# Patient Record
Sex: Female | Born: 1935
Health system: Southern US, Community
[De-identification: ages and names within clinical notes are randomized; demographics above are authoritative.]

## PROBLEM LIST (undated history)

## (undated) DIAGNOSIS — Z9289 Personal history of other medical treatment: Secondary | ICD-10-CM

## (undated) DIAGNOSIS — G253 Myoclonus: Secondary | ICD-10-CM

## (undated) DIAGNOSIS — R911 Solitary pulmonary nodule: Secondary | ICD-10-CM

## (undated) DIAGNOSIS — D3502 Benign neoplasm of left adrenal gland: Secondary | ICD-10-CM

## (undated) DIAGNOSIS — K635 Polyp of colon: Secondary | ICD-10-CM

## (undated) DIAGNOSIS — R739 Hyperglycemia, unspecified: Secondary | ICD-10-CM

## (undated) DIAGNOSIS — I10 Essential (primary) hypertension: Secondary | ICD-10-CM

## (undated) DIAGNOSIS — I2510 Atherosclerotic heart disease of native coronary artery without angina pectoris: Secondary | ICD-10-CM

## (undated) DIAGNOSIS — N200 Calculus of kidney: Secondary | ICD-10-CM

## (undated) DIAGNOSIS — Z720 Tobacco use: Secondary | ICD-10-CM

## (undated) DIAGNOSIS — E785 Hyperlipidemia, unspecified: Secondary | ICD-10-CM

## (undated) HISTORY — DX: Personal history of other medical treatment: Z92.89

## (undated) HISTORY — DX: Tobacco use: Z72.0

## (undated) HISTORY — DX: Calculus of kidney: N20.0

## (undated) HISTORY — DX: Polyp of colon: K63.5

## (undated) HISTORY — DX: Benign neoplasm of left adrenal gland: D35.02

## (undated) HISTORY — DX: Hyperlipidemia, unspecified: E78.5

## (undated) HISTORY — DX: Hyperglycemia, unspecified: R73.9

## (undated) HISTORY — DX: Myoclonus: G25.3

## (undated) HISTORY — DX: Solitary pulmonary nodule: R91.1

## (undated) HISTORY — PX: TOTAL ABDOMINAL HYSTERECTOMY: SHX209

## (undated) HISTORY — PX: CHOLECYSTECTOMY: SHX55

## (undated) HISTORY — PX: CORONARY ANGIOPLASTY WITH STENT PLACEMENT: SHX49

## (undated) HISTORY — DX: Atherosclerotic heart disease of native coronary artery without angina pectoris: I25.10

---

## 1999-08-09 ENCOUNTER — Encounter: Payer: Self-pay | Admitting: Emergency Medicine

## 1999-08-09 ENCOUNTER — Inpatient Hospital Stay (HOSPITAL_COMMUNITY): Admission: EM | Admit: 1999-08-09 | Discharge: 1999-08-11 | Payer: Self-pay | Admitting: Emergency Medicine

## 1999-10-04 ENCOUNTER — Encounter: Admission: RE | Admit: 1999-10-04 | Discharge: 1999-10-04 | Payer: Self-pay | Admitting: Internal Medicine

## 1999-10-04 ENCOUNTER — Encounter: Payer: Self-pay | Admitting: Internal Medicine

## 2000-09-03 ENCOUNTER — Emergency Department (HOSPITAL_COMMUNITY): Admission: EM | Admit: 2000-09-03 | Discharge: 2000-09-03 | Payer: Self-pay | Admitting: Emergency Medicine

## 2000-09-03 ENCOUNTER — Encounter: Payer: Self-pay | Admitting: Emergency Medicine

## 2001-04-19 ENCOUNTER — Ambulatory Visit (HOSPITAL_COMMUNITY): Admission: RE | Admit: 2001-04-19 | Discharge: 2001-04-19 | Payer: Self-pay | Admitting: Gastroenterology

## 2002-02-16 ENCOUNTER — Encounter: Payer: Self-pay | Admitting: Emergency Medicine

## 2002-02-17 ENCOUNTER — Inpatient Hospital Stay (HOSPITAL_COMMUNITY): Admission: EM | Admit: 2002-02-17 | Discharge: 2002-02-17 | Payer: Self-pay | Admitting: Emergency Medicine

## 2003-02-10 ENCOUNTER — Encounter: Payer: Self-pay | Admitting: Internal Medicine

## 2003-02-10 ENCOUNTER — Ambulatory Visit (HOSPITAL_COMMUNITY): Admission: RE | Admit: 2003-02-10 | Discharge: 2003-02-10 | Payer: Self-pay | Admitting: Internal Medicine

## 2004-01-21 ENCOUNTER — Emergency Department (HOSPITAL_COMMUNITY): Admission: EM | Admit: 2004-01-21 | Discharge: 2004-01-21 | Payer: Self-pay | Admitting: Emergency Medicine

## 2004-10-05 ENCOUNTER — Encounter: Admission: RE | Admit: 2004-10-05 | Discharge: 2004-10-05 | Payer: Self-pay | Admitting: Internal Medicine

## 2004-10-20 ENCOUNTER — Encounter: Admission: RE | Admit: 2004-10-20 | Discharge: 2004-10-20 | Payer: Self-pay | Admitting: Internal Medicine

## 2005-03-24 ENCOUNTER — Encounter: Admission: RE | Admit: 2005-03-24 | Discharge: 2005-03-24 | Payer: Self-pay | Admitting: Internal Medicine

## 2005-06-08 ENCOUNTER — Encounter: Admission: RE | Admit: 2005-06-08 | Discharge: 2005-06-08 | Payer: Self-pay | Admitting: Orthopedic Surgery

## 2006-08-15 ENCOUNTER — Encounter: Admission: RE | Admit: 2006-08-15 | Discharge: 2006-08-15 | Payer: Self-pay | Admitting: Gastroenterology

## 2007-02-15 ENCOUNTER — Encounter: Admission: RE | Admit: 2007-02-15 | Discharge: 2007-02-15 | Payer: Self-pay | Admitting: *Deleted

## 2007-02-21 ENCOUNTER — Encounter: Admission: RE | Admit: 2007-02-21 | Discharge: 2007-02-21 | Payer: Self-pay | Admitting: *Deleted

## 2009-02-17 ENCOUNTER — Emergency Department (HOSPITAL_BASED_OUTPATIENT_CLINIC_OR_DEPARTMENT_OTHER): Admission: EM | Admit: 2009-02-17 | Discharge: 2009-02-17 | Payer: Self-pay | Admitting: Emergency Medicine

## 2009-02-17 ENCOUNTER — Ambulatory Visit: Payer: Self-pay | Admitting: Diagnostic Radiology

## 2009-02-28 ENCOUNTER — Encounter: Admission: RE | Admit: 2009-02-28 | Discharge: 2009-02-28 | Payer: Self-pay | Admitting: Geriatric Medicine

## 2009-06-07 ENCOUNTER — Encounter: Admission: RE | Admit: 2009-06-07 | Discharge: 2009-06-07 | Payer: Self-pay | Admitting: Orthopedic Surgery

## 2009-10-01 ENCOUNTER — Encounter: Admission: RE | Admit: 2009-10-01 | Discharge: 2009-10-01 | Payer: Self-pay | Admitting: Geriatric Medicine

## 2009-12-09 ENCOUNTER — Encounter: Admission: RE | Admit: 2009-12-09 | Discharge: 2009-12-09 | Payer: Self-pay | Admitting: Geriatric Medicine

## 2010-03-31 ENCOUNTER — Encounter: Admission: RE | Admit: 2010-03-31 | Discharge: 2010-03-31 | Payer: Self-pay | Admitting: Geriatric Medicine

## 2010-04-06 ENCOUNTER — Encounter: Admission: RE | Admit: 2010-04-06 | Discharge: 2010-04-06 | Payer: Self-pay | Admitting: Geriatric Medicine

## 2010-05-26 ENCOUNTER — Encounter: Admission: RE | Admit: 2010-05-26 | Discharge: 2010-05-26 | Payer: Self-pay | Admitting: Geriatric Medicine

## 2010-08-22 ENCOUNTER — Encounter: Payer: Self-pay | Admitting: *Deleted

## 2010-11-07 LAB — COMPREHENSIVE METABOLIC PANEL
ALT: 11 U/L (ref 0–35)
Alkaline Phosphatase: 43 U/L (ref 39–117)
CO2: 31 mEq/L (ref 19–32)
Chloride: 102 mEq/L (ref 96–112)
GFR calc Af Amer: >60 mL/min (ref >60)
Glucose, Bld: 118 mg/dL — ABNORMAL HIGH (ref 70–99)
Sodium: 142 mEq/L (ref 135–145)
Total Bilirubin: 0.4 mg/dL (ref 0.3–1.2)

## 2010-11-07 LAB — DIFFERENTIAL
Basophils Relative: 1 % (ref 0–1)
Eosinophils Relative: 2 % (ref 0–5)
Monocytes Absolute: 0.6 10*3/uL (ref 0.1–1.0)
Neutro Abs: 4.3 10*3/uL (ref 1.7–7.7)

## 2010-11-07 LAB — CBC
HCT: 42.8 % (ref 36.0–46.0)
MCV: 95.8 fL (ref 78.0–100.0)
Platelets: 211 10*3/uL (ref 150–400)
RBC: 4.47 MIL/uL (ref 3.87–5.11)

## 2010-11-07 LAB — URINE MICROSCOPIC-ADD ON

## 2010-11-07 LAB — URINALYSIS, ROUTINE W REFLEX MICROSCOPIC
Bilirubin Urine: NEGATIVE
Ketones, ur: NEGATIVE mg/dL
Nitrite: NEGATIVE
Protein, ur: NEGATIVE mg/dL
Urobilinogen, UA: 0.2 mg/dL (ref 0.0–1.0)

## 2010-12-17 NOTE — Consult Note (Signed)
[place]. [facility]  Patient:    Kim Sutton                          MRN: [id number] Proc. Date: 08/10/99 Adm. Date:  [id number] Attending:  Rosanne Sack Dictator:   Delano Metz, M.D.                          Consultation Report  CHIEF COMPLAINT:  Chest pain.  HISTORY OF PRESENT ILLNESS:  Kim Sutton is a 77 year old white female who presented to the emergency department after an episode of severe 10/10 substernal chest pain while taking a bath.  This occurred the night of presentation to the ED.  She felt she was "kicked in the chest."  The pain lasts for 15-20 minutes.  It radiated o her neck and right arm.  En route to the emergency department, she vomited four  times.  She also complains of being clammy.  Nitroglycerin sublingual did not relieve her pain initially.  By the time of arrival to the hospital, her pain had decreased significantly.  She is currently with no pain and on a nitroglycerin drip.  The patient had no history of angina.  She has no other medical history.  CARDIAC RISKS FACTORS:  Include a 38-pack-year history of smoking, a family history that includes her mother that had an MI at 74 years of age.  She is also postmenopausal, not on hormone replacement therapy.  PAST MEDICAL HISTORY:  Negative for diabetes. 1. Borderline dyslipidemia. 2. Postmenopausal since 1967, no hormone replacement therapy. 3. Cholecystectomy in 1990. 4. Tobacco abuse.  MEDICATIONS:  None.  ALLERGIES:  CECLOR causes hives.  FAMILY HISTORY: 1. Hypertension in her mother, brother, and sisters. 2. MI in her mother. 3. Diabetes in her mother. 4. Lung cancer in her father.  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  Her nephew, son and grandchildren live in [place]. She stills smokes a pack-per-day.  She does not drink alcohol.  She currently lives alone.  Her husband  is in the process of joining her here after he sells his business.  REVIEW OF SYSTEMS:  Positive for fatigue with exertion recently.  Positive for palpitations "heart racing" on two occasions recently.  PHYSICAL EXAMINATION:  VITAL SIGNS:  Temperature 97.3, blood pressure 117/66, pulse 72, respirations 20, O2 saturations 97% on room air.  GENERAL:  In no acute distress, alert and oriented.  HEENT:  Pupils equal, round, and reactive to light.  Extraocular muscles intact.  NECK:  Supple with no lymphadenopathy, no thyromegaly.  No JVD.  HEART:  S1, S2.  No murmurs, gallops, or rubs.  LUNGS:  Clear to auscultation bilaterally.  No wheezes, rubs, or rhonchi.  ABDOMEN:  Soft, nontender, nondistended.  Positive bowel sounds.  EXTREMITIES:  Negative for edema.  Negative for cyanosis.  Pulses full and symmetric.  NEUROLOGICAL:  No focal deficits.  LABORATORY DATA:  White blood cell count 11.8, hemoglobin 12.8, hematocrit 37.1, platelets 284.  Sodium 139, potassium 3.7, chloride 109, bicarb 27, BUN 17, creatinine 0.7, glucose 103, calcium 8.1.  ECG shows a normal sinus rhythm with possible LVH.  No ST or T changes.  Initial CK is 69, subsequent 128.  Initial MB is 1.4, subsequent 7.5.  Initial troponin I 0.16, subsequent 0.46.  ASSESSMENT AND PLAN:  A 77 year old white female with chest  pain, elevated cardiac enzymes. 1. Cardiac catheterization with possible angioplasty, pain-free currently. 2. Beta blocker started, held this a.m., the patient is on a nitroglycerin drip,    heparin and aspirin. 3. A statin has been started. DD:  08/10/99 TD:  08/10/99 Job: [id number]

## 2010-12-17 NOTE — H&P (Signed)
[place]. [facility]  Patient:    Kim Sutton                          MRN: [id number] Adm. Date:  [id number] Attending:  Doug Sou                         History and Physical  DATE OF BIRTH:  02/07/1934  PROBLEM LIST: 1. Chest pain, rule out myocardial infarction, worrisome for unstable angina. 2. Smoking one-pack per day x 38 years. 3. Hyperlipidemia. 4. Postmenopausal since 1967 after a total abdominal hysterectomy with    bilateral salpingo-oophorectomy; no hormone replacement therapy. 5. Obesity. 6. Status post cholecystectomy in 1990. 7. Allergy to CECLOR (hives).  CHIEF COMPLAINT:  Substernal chest pain.  HISTORY OF PRESENT ILLNESS:  Kim Sutton is a very pleasant 77 year old white female, who recently moved from [place] about two months ago.  Today about 8 p.m., the patient was taking a warm bath when she started noticing a substernal chest pressure that became a pain, radiated into the right arm and right shoulder as ell as the right side of her neck.  This pain lasted about 15-20 minutes.  With the  first two sublingual nitroglycerin, the pain changed from a 10/10 to 6/10 and then with a third nitroglycerin 1/10.  The patient reports no shortness of breath, nausea, vomiting, or diaphoresis with these symptoms.  By the time that the patient arrived in the emergency department, the symptoms resolved.  Kim Sutton denies any previous history of anginal symptoms or similar symptoms like the ones that she had tonight.  She is not very active.  She smokes as described in the problem list.  She has a history of hyperlipidemia, though she does not take any medication for this problems nor follows any specific diet.  She has been postmenopausal without hormone replacement therapy since 1967.  Her mother had an acute MI at age 2 years old.  She is obese.  The patient denies orthopnea, PND, fever, chills, skin rash, joint swelling,  lower extremity pain, lower extremity swelling.  No previous history of MI nor diabetes nor hypertension.  No previous history of GI bleed or PUD.  No focal weakness or swallowing problems.  No postnasal drip.  No cough. No fever.  No diarrhea.  PAST MEDICAL HISTORY:  As problem list.  ALLERGIES:  CECLOR.  MEDICATIONS:  None.  FAMILY MEDICAL HISTORY:  Significant for coronary artery disease (mother acute I at the age of 36).  The patients mother had diabetes as well as CVA. Hypertension runs in the family, her mother, sister, and brother have hypertension.  Her father had a lung cancer after many years of smoking.  SOCIAL HISTORY:  The patient is married.  She has three grown children.  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  She drinks occasionally a glass of wine with meals.  She smokes as described in the problem list.  REVIEW OF SYSTEMS:  As HPI.  No melena.  No tarry stools.  No bright red blood er rectum.  No cough.  No skin rash.  No blurred vision.  No syncope.  No tachycardia. Currently, the patient denies chest pain.  PHYSICAL EXAMINATION:  VITAL SIGNS:  Temperature 97.3, blood pressure 117/66, heart rate 72, respirations 20, oxygen saturations 97% on 2 L nasal cannula.  HEENT:  Normocephalic, atraumatic, nonicteric sclerae.  Conjunctivae within normal limits.  PERRLA.  EOMI.  Funduscopic negative for bleed or hemorrhages.  TMs within normal limits.  Slight dry mucous membranes.  Oropharynx clear.  NECK:  Supple.  No JVD.  No bruits.  No adenopathy.  No thyromegaly.  LUNGS:  Clear to auscultation with occasional scattered rales in the bases, otherwise negative for crackles.  No wheezing.  Free air movement bilaterally.   CARDIAC:  Regular rate and rhythm with no murmurs, rubs, or gallops.  Normal S1, S2.  ABDOMEN:  Flat and slightly obese.  Nontender,  nondistended.  Bowel sounds were present.  No hepatosplenomegaly.  No  bruits.  No masses.  No rebound.  BREASTS:  Within normal limits.  GU:  Examination within normal limits.  RECTAL:  ______ vault, normal sphincter tone.  EXTREMITIES:  No edema, clubbing, or cyanosis.  The patient is 1+ bilaterally.  NEUROLOGICAL:  Alert and oriented x 3, strength 5/5 in all extremities.  DTRs 3/5 in all extremities.  Cranial nerves II-XII intact.  Sensory is intact.  Plantar  reflexes downgoing bilaterally.  LABORATORY DATA:  ECG:  Normal sinus rhythm with a ______ tubular rate of 76, slight J point elevation versus ST segment elevation in V1, V2.  Flattening T wave in aVL.  Normal R wave progression.  The chest x-ray is negative except for some bibasilar atelectasis.  The troponin I is slightly elevated at 0.1.  The CK-MB is normal with a CK of 69 and an MB of 1.4.  Hemoglobin 12.8, MCV 91, WBC 11.8 with a left shift, platelets 384.  Creatinine 0.4, glucose 113, BUN 18, sodium 140, potassium 3.8, chloride 05, CO2 of 33.  A pH is 7.42 with a pCO2 of 48.  ASSESSMENT AND PLAN: 1. Given the patients cardiac risk factors and the history of presentation, her    pretest probability for a significant coronary artery disease is more than 60%.    At this point, the patient is chest pain free as described in the lab data    section, the troponin I is borderline though the CK is negative at this point.    We will go ahead and admit the patient to a telemetry bed.  The cardiac enzymes    and ECG series will be monitored.  Anticoagulation therapy will be provided ith    Lovenox.  We will continue with a nitroglycerin drip.  Aspirin was given by    EMS (four baby aspirin), beta blocker for cardiac protection and continue the    oxygen supplementation.  Will consider Integrilin.  A cardiology consult will be    obtained to consider a cardiac catheterization for further work-up. 2. Smoking--I spent about 10 minutes talking about smoking cessation.  We will o     ahead and start the patient on a nicotine patch. 3. Hyperlipidemia--given the history of present illness with the cardiac risk    factors described in the history of present illness, we will start the patient     on Zocor and the LFTs will be monitored in the next few days. 4. Postmenopausal--the patient should started on hormone replacement therapy as    soon as her cardiac work-up is finished. DD:  08/10/99 TD:  08/10/99 Job: [id number]

## 2010-12-17 NOTE — Procedures (Signed)
[place]. [facility]  Patient:    Kim Sutton, Kim Sutton Visit Number: [id number] MRN: [id number]          Service Type: Attending:  Verlin Grills, M.D. Dictated by:   Verlin Grills, M.D. Proc. Date: 04/19/01   CC:         Julieanne Manson, M.D.   Procedure Report  DATE OF BIRTH:  28-Mar-1934  REFERRING PHYSICIAN:  Julieanne Manson, M.D.  PROCEDURE PERFORMED:  Colonoscopy.  ENDOSCOPIST:  Verlin Grills, M.D.  INDICATIONS FOR PROCEDURE:  The patient is a 76 year old female who is due for her first screening colonoscopy with polypectomy to prevent colon cancer.  Her 55 year old brother was recently diagnosed with colon cancer.  PREMEDICATION:  Fentanyl 50 mcg, Versed 5 mg.  ENDOSCOPE:  Olympus pediatric video colonoscope.  DESCRIPTION OF PROCEDURE:  After obtaining informed consent, the patient was placed in the left lateral decubitus position.  I administered intravenous fentanyl and intravenous Versed to achieve conscious sedation for the procedure.  The patients blood pressure, oxygen saturation and cardiac rhythm were monitored throughout the procedure and documented in the medical record.  Anal inspection was normal.  Digital rectal exam was normal.  The Olympus pediatric video colonoscope was then introduced into the rectum and easily advanced to the cecum.  Colonic preparation for the exam today was excellent.  Rectum:  Normal.  Sigmoid colon and descending colon:  Normal.  Splenic flexure:  Normal.  Transverse colon:  Normal.  Hepatic flexure:  Normal.  Ascending colon:  Normal.  Cecum and ileocecal valve:  Normal.  ASSESSMENT:  Normal proctocolonoscopy to the cecum.  No endoscopic evidence for the presence of colorectal neoplasia. Dictated by:   Verlin Grills, M.D. Attending:  Verlin Grills, M.D. DD:  04/19/01 TD:  04/19/01 Job: [id number]

## 2010-12-17 NOTE — Discharge Summary (Signed)
[place]. [facility]  Patient:    Kim Sutton, Kim Sutton Visit Number: [id number] MRN: [id number]          Service Type: MED Location: [id number] Attending Physician:  Jerl Santos Admit Date:  02/16/2002 Discharge Date: 02/17/2002   CC:         Jerl Santos, M.D.  Julieanne Manson, M.D.   Discharge Summary  DISCHARGE DIAGNOSES: 1. Right lower extremity cellulitis - improved. 2. Chronic tobacco abuse. 3. Hyperlipidemia. 4. Coronary artery disease, status post stent placement 2001. 5. Hypertension.  DISCHARGE MEDICATIONS: 1. Tequin 400 mg daily x7 days. 2. Lipitor 20 mg a day. 3. Metoprolol 50 mg a day. 4. Lisinopril 20/25 one daily. 5. Aspirin 325 mg daily.  HOSPITAL COURSE:  The patient is a pleasant 77 year old female who developed some right lower extremity distal leg anterior erythema the day prior to admission.  It became quite painful so that she was unable to walk without pain.  Dorsiflexion of the foot causes pain in the distal anterior leg area. She took two Advil three times a day without benefit, came to the emergency room and was found to have an area of erythema over the distal anterior right leg.  She was started on IV Tequin and the erythema and pain improved greatly overnight and she was currently afebrile and feeling much improved.  Repeat examination of the leg on the day of discharge revealed no pain in the calf, no cords noted and no significant edema of the leg.  ASSESSMENT:  Cellulitis - improved on one dose of intravenous Tequin.  PLAN:  Will discharge home to take seven more days of Tequin 400 mg daily.  If she develops fever to 100 or greater, or increase in erythema or pain, she is to contact Julieanne Manson, M.D., immediately.  Otherwise she should follow-up with Dr. Delrae Alfred later this week. Attending Physician:  Jerl Santos DD:  02/17/02 TD:  02/22/02 Job: [id number]

## 2010-12-17 NOTE — Discharge Summary (Signed)
[place]. [facility]  Patient:    Kim Sutton, Kim Sutton                         MRN: [id number] Adm. Date:  08/09/99 Disc. Date: 08/11/99 Dictator:   Anastasio Auerbach, M.D. CC:         Verdis Prime, M.D.             Julieanne Manson, M.D.                           Discharge Summary  DATE OF BIRTH:  July 11, 1934  DISCHARGE DIAGNOSES: 1.  Acute MI.     a.  Status post PTCA of the RCA. 2.  ? Hyperlipidemia. 3.  Tobacco use.     a.  One pack per day x 38 years. 4.  Postmenopausal, since 1967.     a.  Status post TAH/BSO, 1967. 5.  Status post cholecystectomy, 1990. 6.  Allergy to Ceclor (hives). 7.  Normocytic anemia.     a.  Discharge hemoglobin 11.6, MCV 91.  DISCHARGE MEDICATIONS: 1.  Lopressor 50 mg 1/2 tablet b.i.d. 2.  Nitroglycerin 0.4 mg sublingual p.r.n. 3.  Plavix 75 mg q.d. x four weeks. 4.  Aspirin 325 mg q.d. 5.  Nicotine patch 21 mg q.d. x two weeks, then decrease to 14 mg q.d. x     two weeks, then stop.  DISCHARGE CONDITION:  Stable.  The patient is pain-free.  ACTIVITY:  Recommended no strenuous activity until follow-up with Dr. Katrinka Blazing.  DIET:  Recommended low-fat diet.  SPECIAL INSTRUCTIONS:  The patient was instructed not to resume smoking.  She should wash the groin site with soap and water and call if there are any problems.  DISPOSITION: 1.  The patient has an appointment to see Dr. Verdis Prime, cardiologist, on Friday,     August 20, 1999, at 11:00 a.m.  At that time she will need her symptoms as  well as her blood pressure reassessed.  She should also have her lipids     evaluated and be considered for therapy.  She should also have her smoking     status readdressed. 2.  The patient has been set up to see a primary care physician, Dr. Felizardo Hoffmann,     at [facility] on Thursday, September 02, 1999, at 4:15 p.m.  Dr.     Delrae Alfred should address the smoking as well as the patients postmenopausal     state and normocytic  anemia.  CONSULTANTS:  Verdis Prime, M.D.  PROCEDURES: 1.  EKG (August 10, 1999), sinus bradycardia; otherwise normal. 2.  Cardiac catheterization (August 10, 1999), left main, LAD, circumflex okay.     RCA 95% mid stenosis.  Status post PTCA to 0% stenosis.  Angioplasty site     stented.  LV function normal.  HOSPITAL COURSE: #1 - ACUTE MYOCARDIAL INFARCTION:  This 77 year old Caucasian woman with a history of tobacco use, hyperlipidemia, postmenopausal state, and family history of CAD, presents with substernal chest pain radiating to her right arm and shoulder. Upon arrival to the emergency department her symptoms had resolved but given these risk factors she was admitted for rule-out.  Initial Troponin was slightly elevated t 0.10.  The CK was normal at 69 with an MB fraction of 1.4.  Her second Troponin I had increased to 0.46 and her total CK was 128 with an MB fraction  of 7.5.  She had been placed on heparin and nitroglycerin, beta-blocker and aspirin, as well as oxygen on admission.  Cardiology was consulted and the patient underwent cardiac catheterization.  She did indeed have a lesion in the RCA, which was stented. he had no complications related to the procedure.  She will be discharged on Plavix for four weeks as well as long-term aspirin and beta-blocker.  She will be re-evaluated as an outpatient concerning the use of any lipid-lowering medications. She will be counseled on smoking cessation as an outpatient.  Her postmenopausal state will also be addressed.  She will follow up with her cardiologist and be assigned to a new primary care Nesreen Albano.  #2 -  POSTMENOPAUSAL:  The patient did have a hysterectomy years ago.  She has never been on Premarin.  It certainly may be beneficial with her lipid status although after the first event data is varying.  Will have her follow up with Dr. Felizardo Hoffmann to discuss this further.  #3 - NORMOCYTIC ANEMIA:  Ms. Dunivan  had a slight hemoglobin drop after her cardiac catheterization.  This can be repeated in three to four weeks.  DISCHARGE LABORATORY DATA:  Sodium 138, potassium 3.5, chloride 104, bicarbonate 29, BUN 9, creatinine 0.7, glucose 88.  Hemoglobin 11.6, WBC 7600; platelet count 247,000. DD:  08/11/99 TD:  08/11/99 Job: [id number]

## 2010-12-17 NOTE — Cardiovascular Report (Signed)
[place]. [facility]  Patient:    Kim Sutton, Kim Sutton                         MRN: [id number] Proc. Date: 08/10/99 Adm. Date:  08/09/99 Attending:  Celso Sickle, M.D. CC:         Rosanne Sack, M.D.                        Cardiac Catheterization  CINE NUMBER:  [id number]  INDICATIONS:  Acute coronary syndrome.  PROCEDURES PERFORMED: 1. Left heart catheterization. 2. Selective coronary angiography. 3. Left ventriculography.  DESCRIPTION OF PROCEDURE:  After informed consent, a 6-French sheath was started in the right femoral artery using the modified Seldinger technique.  A 6-French A2  multipurpose catheter was used for hemodynamic recordings, left ventriculography, and selective left and right coronary angiography.  The patient tolerated the diagnostic procedure without complications.  After review of the cineangiograms, it was felt that percutaneous intervention on the right coronary artery was necessary.  We upgraded to a 7-French arterial sheath system over the guide wire.  We used  7-French #4 right Judkins guide catheter and a BMW 0.014 190 cm long wire. Angioplasty was performed with a 3.5 mm diameter CrossSail balloon.  One inflation was performed to 12 atm.  We then deployed an 18 mm long Tetra 4.0 mm stent to 16 atm.  Balloon time was 74 seconds.  The patient had chest discomfort with each balloon inflation.  The patient tolerated this procedure without complications.   The patient received 3000 units of IV heparin prior to starting the procedure and was given a bolus and infusion of ReoPro.  ACT post procedure was 311 seconds.   RESULTS:    I. HEMODYNAMIC DATA:        a. Aortic pressure:  117/60.        b. Left ventricular pressure:  120/15.   II. LEFT VENTRICULOGRAPHY:  The left ventricle demonstrates normal contractility.      No mitral regurgitation is noted.  Ejection fraction is 75%.  III. SELECTIVE CORONARY  ANGIOGRAPHY:        a. Left main coronary:  Normal.        b. Left anterior descending coronary artery:  The LAD is a large vessel hat           reaches the left ventricular apex.  It gives origin to two large diagonal           branches.  There is also a septal perforator that arises proximally. The           left system contains some calcification and irregularities, but no           high-grade obstruction.  The second diagonal contains a 40% ostial           stenosis.        c. Circumflex artery:  The circumflex is moderate in size and gives origin           to a bifurcating obtuse marginal system.  Two obtuse marginals arise rom           the proximal circumflex.  No significant obstruction is noted.        d. Right coronary artery:  The right coronary artery is large, giving origin           to a large PDA and a large  left ventricular branch that trifurcates on           the left lateral wall.  In the mid RCA, there is an eccentric significant           stenosis that appears to be 70% narrowed in the LAO projections and 95%           narrowed with evidence of an intimal dissection in the RAO projections.           There is post-stenotic dilatation with what appears to be a fusiform rea           of ectasia measuring greater than 5 mm in diameter.  No significant           disease is noted other than in this region.   IV. PERCUTANEOUS CORONARY INTERVENTION:  The right coronary in the mid segment is      85% to 95% occluded and after stenting there is 0% stenosis with brisk      antegrade flow.  CONCLUSIONS: 1. Acute coronary syndrome due to ruptured plaque with intimal dissection in the    mid right coronary artery. 2. Successful percutaneous coronary intervention on the right coronary artery with    reduction in stenosis from 85% to 95% down to 0%. 3. No significant disease is noted in the left coronary system.  The second    diagonal contains a 40% lesion and there  are irregularities in the left anterior    descending artery. 4. Normal left ventricular function.  RECOMMENDATIONS:  Complete 12-hour ReoPro infusion.  Plavix will be started. Beta-blocker will be continued.  Heparin will not be resumed.  The patient will be discharged hopefully on August 11, 1999.DD:  08/10/99 TD:  08/10/99 Job: [id number]

## 2011-08-05 DIAGNOSIS — Z0000 Encounter for general adult medical examination without abnormal findings: Secondary | ICD-10-CM | POA: Diagnosis not present

## 2011-08-05 DIAGNOSIS — N951 Menopausal and female climacteric states: Secondary | ICD-10-CM | POA: Diagnosis not present

## 2011-08-15 DIAGNOSIS — G0491 Myelitis, unspecified: Secondary | ICD-10-CM | POA: Diagnosis not present

## 2011-08-15 DIAGNOSIS — G379 Demyelinating disease of central nervous system, unspecified: Secondary | ICD-10-CM | POA: Diagnosis not present

## 2011-08-15 DIAGNOSIS — G0490 Encephalitis and encephalomyelitis, unspecified: Secondary | ICD-10-CM | POA: Diagnosis not present

## 2011-08-15 DIAGNOSIS — M48061 Spinal stenosis, lumbar region without neurogenic claudication: Secondary | ICD-10-CM | POA: Diagnosis not present

## 2011-08-15 DIAGNOSIS — R269 Unspecified abnormalities of gait and mobility: Secondary | ICD-10-CM | POA: Diagnosis not present

## 2011-08-31 DIAGNOSIS — Z780 Asymptomatic menopausal state: Secondary | ICD-10-CM | POA: Diagnosis not present

## 2011-09-05 DIAGNOSIS — IMO0002 Reserved for concepts with insufficient information to code with codable children: Secondary | ICD-10-CM | POA: Diagnosis not present

## 2011-09-05 DIAGNOSIS — M6281 Muscle weakness (generalized): Secondary | ICD-10-CM | POA: Diagnosis not present

## 2011-09-07 DIAGNOSIS — IMO0002 Reserved for concepts with insufficient information to code with codable children: Secondary | ICD-10-CM | POA: Diagnosis not present

## 2011-09-09 DIAGNOSIS — IMO0002 Reserved for concepts with insufficient information to code with codable children: Secondary | ICD-10-CM | POA: Diagnosis not present

## 2011-09-09 DIAGNOSIS — M6281 Muscle weakness (generalized): Secondary | ICD-10-CM | POA: Diagnosis not present

## 2011-09-12 DIAGNOSIS — M6281 Muscle weakness (generalized): Secondary | ICD-10-CM | POA: Diagnosis not present

## 2011-09-12 DIAGNOSIS — IMO0002 Reserved for concepts with insufficient information to code with codable children: Secondary | ICD-10-CM | POA: Diagnosis not present

## 2011-09-13 DIAGNOSIS — Z1211 Encounter for screening for malignant neoplasm of colon: Secondary | ICD-10-CM | POA: Diagnosis not present

## 2011-09-13 DIAGNOSIS — Z800 Family history of malignant neoplasm of digestive organs: Secondary | ICD-10-CM | POA: Diagnosis not present

## 2011-09-13 DIAGNOSIS — D126 Benign neoplasm of colon, unspecified: Secondary | ICD-10-CM | POA: Diagnosis not present

## 2011-09-14 DIAGNOSIS — M6281 Muscle weakness (generalized): Secondary | ICD-10-CM | POA: Diagnosis not present

## 2011-09-14 DIAGNOSIS — IMO0002 Reserved for concepts with insufficient information to code with codable children: Secondary | ICD-10-CM | POA: Diagnosis not present

## 2011-09-14 DIAGNOSIS — R262 Difficulty in walking, not elsewhere classified: Secondary | ICD-10-CM | POA: Diagnosis not present

## 2011-09-16 DIAGNOSIS — M6281 Muscle weakness (generalized): Secondary | ICD-10-CM | POA: Diagnosis not present

## 2011-09-16 DIAGNOSIS — IMO0002 Reserved for concepts with insufficient information to code with codable children: Secondary | ICD-10-CM | POA: Diagnosis not present

## 2011-09-19 DIAGNOSIS — IMO0002 Reserved for concepts with insufficient information to code with codable children: Secondary | ICD-10-CM | POA: Diagnosis not present

## 2011-09-19 DIAGNOSIS — M6281 Muscle weakness (generalized): Secondary | ICD-10-CM | POA: Diagnosis not present

## 2011-09-19 DIAGNOSIS — R269 Unspecified abnormalities of gait and mobility: Secondary | ICD-10-CM | POA: Diagnosis not present

## 2011-09-23 DIAGNOSIS — M6281 Muscle weakness (generalized): Secondary | ICD-10-CM | POA: Diagnosis not present

## 2011-09-23 DIAGNOSIS — R269 Unspecified abnormalities of gait and mobility: Secondary | ICD-10-CM | POA: Diagnosis not present

## 2011-09-26 DIAGNOSIS — M6281 Muscle weakness (generalized): Secondary | ICD-10-CM | POA: Diagnosis not present

## 2011-10-03 DIAGNOSIS — IMO0002 Reserved for concepts with insufficient information to code with codable children: Secondary | ICD-10-CM | POA: Diagnosis not present

## 2011-10-03 DIAGNOSIS — M6281 Muscle weakness (generalized): Secondary | ICD-10-CM | POA: Diagnosis not present

## 2011-10-07 DIAGNOSIS — R262 Difficulty in walking, not elsewhere classified: Secondary | ICD-10-CM | POA: Diagnosis not present

## 2011-10-07 DIAGNOSIS — M6281 Muscle weakness (generalized): Secondary | ICD-10-CM | POA: Diagnosis not present

## 2011-10-11 DIAGNOSIS — M6281 Muscle weakness (generalized): Secondary | ICD-10-CM | POA: Diagnosis not present

## 2011-10-14 DIAGNOSIS — M545 Low back pain: Secondary | ICD-10-CM | POA: Diagnosis not present

## 2011-10-14 DIAGNOSIS — M6281 Muscle weakness (generalized): Secondary | ICD-10-CM | POA: Diagnosis not present

## 2011-10-17 DIAGNOSIS — M6281 Muscle weakness (generalized): Secondary | ICD-10-CM | POA: Diagnosis not present

## 2011-10-19 DIAGNOSIS — M6281 Muscle weakness (generalized): Secondary | ICD-10-CM | POA: Diagnosis not present

## 2011-10-21 DIAGNOSIS — M6281 Muscle weakness (generalized): Secondary | ICD-10-CM | POA: Diagnosis not present

## 2011-10-26 DIAGNOSIS — M6281 Muscle weakness (generalized): Secondary | ICD-10-CM | POA: Diagnosis not present

## 2011-10-31 DIAGNOSIS — IMO0002 Reserved for concepts with insufficient information to code with codable children: Secondary | ICD-10-CM | POA: Diagnosis not present

## 2011-10-31 DIAGNOSIS — G0489 Other myelitis: Secondary | ICD-10-CM | POA: Diagnosis not present

## 2011-10-31 DIAGNOSIS — M25559 Pain in unspecified hip: Secondary | ICD-10-CM | POA: Diagnosis not present

## 2011-11-04 DIAGNOSIS — E7800 Pure hypercholesterolemia, unspecified: Secondary | ICD-10-CM | POA: Diagnosis not present

## 2011-11-04 DIAGNOSIS — Z79899 Other long term (current) drug therapy: Secondary | ICD-10-CM | POA: Diagnosis not present

## 2011-11-04 DIAGNOSIS — I10 Essential (primary) hypertension: Secondary | ICD-10-CM | POA: Diagnosis not present

## 2011-11-11 DIAGNOSIS — M48061 Spinal stenosis, lumbar region without neurogenic claudication: Secondary | ICD-10-CM | POA: Diagnosis not present

## 2011-11-16 DIAGNOSIS — M48061 Spinal stenosis, lumbar region without neurogenic claudication: Secondary | ICD-10-CM | POA: Diagnosis not present

## 2011-11-16 DIAGNOSIS — M549 Dorsalgia, unspecified: Secondary | ICD-10-CM | POA: Diagnosis not present

## 2011-12-09 DIAGNOSIS — IMO0002 Reserved for concepts with insufficient information to code with codable children: Secondary | ICD-10-CM | POA: Diagnosis not present

## 2011-12-09 DIAGNOSIS — M48061 Spinal stenosis, lumbar region without neurogenic claudication: Secondary | ICD-10-CM | POA: Diagnosis not present

## 2011-12-17 DIAGNOSIS — G44209 Tension-type headache, unspecified, not intractable: Secondary | ICD-10-CM | POA: Diagnosis not present

## 2011-12-17 DIAGNOSIS — I10 Essential (primary) hypertension: Secondary | ICD-10-CM | POA: Diagnosis not present

## 2011-12-17 DIAGNOSIS — Z0000 Encounter for general adult medical examination without abnormal findings: Secondary | ICD-10-CM | POA: Diagnosis not present

## 2011-12-17 DIAGNOSIS — E7800 Pure hypercholesterolemia, unspecified: Secondary | ICD-10-CM | POA: Diagnosis not present

## 2012-04-30 DIAGNOSIS — G0489 Other myelitis: Secondary | ICD-10-CM | POA: Diagnosis not present

## 2012-05-04 DIAGNOSIS — I10 Essential (primary) hypertension: Secondary | ICD-10-CM | POA: Diagnosis not present

## 2012-05-04 DIAGNOSIS — Z79899 Other long term (current) drug therapy: Secondary | ICD-10-CM | POA: Diagnosis not present

## 2012-05-04 DIAGNOSIS — E7800 Pure hypercholesterolemia, unspecified: Secondary | ICD-10-CM | POA: Diagnosis not present

## 2012-05-04 DIAGNOSIS — Z23 Encounter for immunization: Secondary | ICD-10-CM | POA: Diagnosis not present

## 2012-05-21 DIAGNOSIS — M5030 Other cervical disc degeneration, unspecified cervical region: Secondary | ICD-10-CM | POA: Diagnosis not present

## 2012-05-21 DIAGNOSIS — G0489 Other myelitis: Secondary | ICD-10-CM | POA: Diagnosis not present

## 2012-08-13 DIAGNOSIS — G0489 Other myelitis: Secondary | ICD-10-CM | POA: Diagnosis not present

## 2012-08-13 DIAGNOSIS — R269 Unspecified abnormalities of gait and mobility: Secondary | ICD-10-CM | POA: Diagnosis not present

## 2012-08-13 DIAGNOSIS — IMO0002 Reserved for concepts with insufficient information to code with codable children: Secondary | ICD-10-CM | POA: Diagnosis not present

## 2012-08-14 DIAGNOSIS — F17200 Nicotine dependence, unspecified, uncomplicated: Secondary | ICD-10-CM | POA: Diagnosis not present

## 2012-08-14 DIAGNOSIS — Z0000 Encounter for general adult medical examination without abnormal findings: Secondary | ICD-10-CM | POA: Diagnosis not present

## 2012-08-14 DIAGNOSIS — I10 Essential (primary) hypertension: Secondary | ICD-10-CM | POA: Diagnosis not present

## 2012-08-14 DIAGNOSIS — Z1331 Encounter for screening for depression: Secondary | ICD-10-CM | POA: Diagnosis not present

## 2012-11-12 DIAGNOSIS — G0489 Other myelitis: Secondary | ICD-10-CM | POA: Diagnosis not present

## 2012-11-12 DIAGNOSIS — R29898 Other symptoms and signs involving the musculoskeletal system: Secondary | ICD-10-CM | POA: Diagnosis not present

## 2012-11-27 DIAGNOSIS — M2390 Unspecified internal derangement of unspecified knee: Secondary | ICD-10-CM | POA: Diagnosis not present

## 2012-11-27 DIAGNOSIS — M25569 Pain in unspecified knee: Secondary | ICD-10-CM | POA: Diagnosis not present

## 2013-01-21 DIAGNOSIS — J329 Chronic sinusitis, unspecified: Secondary | ICD-10-CM | POA: Diagnosis not present

## 2013-02-12 DIAGNOSIS — E7800 Pure hypercholesterolemia, unspecified: Secondary | ICD-10-CM | POA: Diagnosis not present

## 2013-02-12 DIAGNOSIS — Z79899 Other long term (current) drug therapy: Secondary | ICD-10-CM | POA: Diagnosis not present

## 2013-02-12 DIAGNOSIS — M25519 Pain in unspecified shoulder: Secondary | ICD-10-CM | POA: Diagnosis not present

## 2013-02-12 DIAGNOSIS — I998 Other disorder of circulatory system: Secondary | ICD-10-CM | POA: Diagnosis not present

## 2013-02-12 DIAGNOSIS — I10 Essential (primary) hypertension: Secondary | ICD-10-CM | POA: Diagnosis not present

## 2013-02-19 ENCOUNTER — Other Ambulatory Visit: Payer: Self-pay

## 2013-02-19 DIAGNOSIS — Z1231 Encounter for screening mammogram for malignant neoplasm of breast: Secondary | ICD-10-CM

## 2013-03-05 ENCOUNTER — Ambulatory Visit
Admission: RE | Admit: 2013-03-05 | Discharge: 2013-03-05 | Disposition: A | Discharge status: Home / Self Care | Payer: Medicare Other | Source: Ambulatory Visit

## 2013-03-05 DIAGNOSIS — Z1231 Encounter for screening mammogram for malignant neoplasm of breast: Secondary | ICD-10-CM | POA: Diagnosis not present

## 2013-04-30 DIAGNOSIS — H6120 Impacted cerumen, unspecified ear: Secondary | ICD-10-CM | POA: Diagnosis not present

## 2013-05-20 DIAGNOSIS — G0489 Other myelitis: Secondary | ICD-10-CM | POA: Diagnosis not present

## 2013-05-20 DIAGNOSIS — M48061 Spinal stenosis, lumbar region without neurogenic claudication: Secondary | ICD-10-CM | POA: Diagnosis not present

## 2013-05-20 DIAGNOSIS — B029 Zoster without complications: Secondary | ICD-10-CM | POA: Diagnosis not present

## 2013-06-11 DIAGNOSIS — Z79899 Other long term (current) drug therapy: Secondary | ICD-10-CM | POA: Diagnosis not present

## 2013-06-11 DIAGNOSIS — Z23 Encounter for immunization: Secondary | ICD-10-CM | POA: Diagnosis not present

## 2013-06-11 DIAGNOSIS — F17200 Nicotine dependence, unspecified, uncomplicated: Secondary | ICD-10-CM | POA: Diagnosis not present

## 2013-06-11 DIAGNOSIS — R233 Spontaneous ecchymoses: Secondary | ICD-10-CM | POA: Diagnosis not present

## 2013-06-11 DIAGNOSIS — E7800 Pure hypercholesterolemia, unspecified: Secondary | ICD-10-CM | POA: Diagnosis not present

## 2013-06-11 DIAGNOSIS — I10 Essential (primary) hypertension: Secondary | ICD-10-CM | POA: Diagnosis not present

## 2013-06-11 DIAGNOSIS — R209 Unspecified disturbances of skin sensation: Secondary | ICD-10-CM | POA: Diagnosis not present

## 2013-06-19 DIAGNOSIS — M25569 Pain in unspecified knee: Secondary | ICD-10-CM | POA: Diagnosis not present

## 2013-06-19 DIAGNOSIS — M2390 Unspecified internal derangement of unspecified knee: Secondary | ICD-10-CM | POA: Diagnosis not present

## 2013-07-10 DIAGNOSIS — M25569 Pain in unspecified knee: Secondary | ICD-10-CM | POA: Diagnosis not present

## 2013-07-10 DIAGNOSIS — M2390 Unspecified internal derangement of unspecified knee: Secondary | ICD-10-CM | POA: Diagnosis not present

## 2013-07-16 DIAGNOSIS — M23302 Other meniscus derangements, unspecified lateral meniscus, unspecified knee: Secondary | ICD-10-CM | POA: Diagnosis not present

## 2013-07-16 DIAGNOSIS — M25539 Pain in unspecified wrist: Secondary | ICD-10-CM | POA: Diagnosis not present

## 2013-07-16 DIAGNOSIS — M25569 Pain in unspecified knee: Secondary | ICD-10-CM | POA: Diagnosis not present

## 2013-07-16 DIAGNOSIS — M76899 Other specified enthesopathies of unspecified lower limb, excluding foot: Secondary | ICD-10-CM | POA: Diagnosis not present

## 2013-07-29 ENCOUNTER — Emergency Department (HOSPITAL_BASED_OUTPATIENT_CLINIC_OR_DEPARTMENT_OTHER)
Admission: EM | Admit: 2013-07-29 | Discharge: 2013-07-29 | Disposition: A | Discharge status: Home / Self Care | Payer: Medicare Other | Attending: Emergency Medicine | Admitting: Emergency Medicine

## 2013-07-29 ENCOUNTER — Emergency Department (HOSPITAL_BASED_OUTPATIENT_CLINIC_OR_DEPARTMENT_OTHER): Payer: Medicare Other

## 2013-07-29 ENCOUNTER — Encounter (HOSPITAL_BASED_OUTPATIENT_CLINIC_OR_DEPARTMENT_OTHER): Payer: Self-pay | Admitting: Emergency Medicine

## 2013-07-29 DIAGNOSIS — J40 Bronchitis, not specified as acute or chronic: Secondary | ICD-10-CM | POA: Diagnosis not present

## 2013-07-29 DIAGNOSIS — J159 Unspecified bacterial pneumonia: Secondary | ICD-10-CM | POA: Diagnosis not present

## 2013-07-29 DIAGNOSIS — F17200 Nicotine dependence, unspecified, uncomplicated: Secondary | ICD-10-CM | POA: Insufficient documentation

## 2013-07-29 DIAGNOSIS — I252 Old myocardial infarction: Secondary | ICD-10-CM | POA: Diagnosis not present

## 2013-07-29 DIAGNOSIS — Z862 Personal history of diseases of the blood and blood-forming organs and certain disorders involving the immune mechanism: Secondary | ICD-10-CM | POA: Insufficient documentation

## 2013-07-29 DIAGNOSIS — R1110 Vomiting, unspecified: Secondary | ICD-10-CM | POA: Diagnosis not present

## 2013-07-29 DIAGNOSIS — J189 Pneumonia, unspecified organism: Secondary | ICD-10-CM | POA: Diagnosis not present

## 2013-07-29 DIAGNOSIS — I10 Essential (primary) hypertension: Secondary | ICD-10-CM | POA: Insufficient documentation

## 2013-07-29 DIAGNOSIS — Z8639 Personal history of other endocrine, nutritional and metabolic disease: Secondary | ICD-10-CM | POA: Insufficient documentation

## 2013-07-29 DIAGNOSIS — R072 Precordial pain: Secondary | ICD-10-CM | POA: Diagnosis not present

## 2013-07-29 HISTORY — DX: Essential (primary) hypertension: I10

## 2013-07-29 LAB — CBC WITH DIFFERENTIAL/PLATELET
Basophils Absolute: 0 10*3/uL (ref 0.0–0.1)
Basophils Relative: 0 % (ref 0–1)
Eosinophils Absolute: 0 10*3/uL (ref 0.0–0.7)
Eosinophils Relative: 0 % (ref 0–5)
HCT: 44.3 % (ref 36.0–46.0)
Lymphocytes Relative: 19 % (ref 12–46)
MCHC: 33.9 g/dL (ref 30.0–36.0)
MCV: 96.5 fL (ref 78.0–100.0)
Monocytes Absolute: 0.8 10*3/uL (ref 0.1–1.0)
Monocytes Relative: 9 % (ref 3–12)
Platelets: 241 10*3/uL (ref 150–400)
RDW: 13.6 % (ref 11.5–15.5)

## 2013-07-29 LAB — BASIC METABOLIC PANEL
BUN: 13 mg/dL (ref 6–23)
CO2: 27 mEq/L (ref 19–32)
Calcium: 9.9 mg/dL (ref 8.4–10.5)
Creatinine, Ser: 0.8 mg/dL (ref 0.50–1.10)
GFR calc Af Amer: 80 mL/min — ABNORMAL LOW (ref >90)
GFR calc non Af Amer: 69 mL/min — ABNORMAL LOW (ref >90)
Sodium: 139 mEq/L (ref 135–145)

## 2013-07-29 LAB — TROPONIN I: Troponin I: <0.3 ng/mL (ref <0.30)

## 2013-07-29 MED ORDER — LEVOFLOXACIN 750 MG PO TABS: 750.0000 mg | ORAL_TABLET | Freq: Every day | ORAL | Status: DC

## 2013-07-29 MED ORDER — ASPIRIN 81 MG PO CHEW
324.0000 mg | CHEWABLE_TABLET | Freq: Once | ORAL | Status: AC
  Administered 2013-07-29: 324 mg via ORAL
  Filled 2013-07-29: qty 4

## 2013-07-29 MED ORDER — LEVOFLOXACIN 750 MG PO TABS
750.0000 mg | ORAL_TABLET | Freq: Once | ORAL | Status: AC
  Administered 2013-07-29: 750 mg via ORAL
  Filled 2013-07-29: qty 1

## 2013-07-29 NOTE — ED Provider Notes (Signed)
Medical screening examination/treatment/procedure(s) were performed by non-physician practitioner and as supervising physician I was immediately available for consultation/collaboration.  EKG Interpretation   None         Gilda Crease, MD 07/29/13 1601

## 2013-07-29 NOTE — ED Notes (Signed)
C/o intermittent central CP started yesterday

## 2013-07-29 NOTE — ED Provider Notes (Signed)
CSN: [id number]     Arrival date & time 07/29/13  1414 History   First MD Initiated Contact with Patient 07/29/13 1421     Chief Complaint  Patient presents with  . Chest Pain   (Consider location/radiation/quality/duration/timing/severity/associated sxs/prior Treatment) HPI Comments: Pt states that she started having intermittent substernal pain yesterday. Pt states that the pain go worse today:denies having pain at this time:pt states that she initially thought the symptoms were the flu as she has had a cough:pt states that she had one emesis earlier today. Pt states that when it initially started it went up into her left jaw for a minute and she hasn't had the jaw pain since:pt states that she had an mi 13 years ago and she pain wasn't similar:pt hasn't seen cardiology in 2 years since they told her unless she was having problems she didn't need to come QM:VHQION sob, diaphoresis  The history is provided by the patient. No language interpreter was used.    Past Medical History  Diagnosis Date  . Myocardial infarct   . Hypertension   . High cholesterol    Past Surgical History  Procedure Laterality Date  . Coronary angioplasty with stent placement     No family history on file. History  Substance Use Topics  . Smoking status: Current Every Day Smoker  . Smokeless tobacco: Not on file  . Alcohol Use: No   OB History   Grav Para Term Preterm Abortions TAB SAB Ect Mult Living                 Review of Systems  Constitutional: Negative.   Respiratory: Positive for cough. Negative for shortness of breath.   Cardiovascular: Positive for chest pain.    Allergies  Ceclor  Home Medications  No current outpatient prescriptions on file. BP 144/77  Pulse 65  Temp(Src) 99.1 F (37.3 C) (Oral)  Resp 20  Ht 5\' 6"  (1.676 m)  Wt 152 lb (68.947 kg)  BMI 24.55 kg/m2  SpO2 95% Physical Exam  Vitals reviewed. Constitutional: She appears well-developed and well-nourished.   HENT:  Head: Normocephalic and atraumatic.  Eyes: Conjunctivae and EOM are normal.  Neck: Neck supple.  Cardiovascular: Normal rate and regular rhythm.   Pulmonary/Chest: Effort normal and breath sounds normal.  Abdominal: Soft. Bowel sounds are normal. There is no tenderness.  Musculoskeletal: Normal range of motion.  Neurological: She is alert.  Skin: Skin is warm and dry.  Psychiatric: She has a normal mood and affect.    ED Course  Procedures (including critical care time) Labs Review Labs Reviewed  BASIC METABOLIC PANEL - Abnormal; Notable for the following:    Glucose, Bld 183 (*)    GFR calc non Af Amer 69 (*)    GFR calc Af Amer 80 (*)    All other components within normal limits  CBC WITH DIFFERENTIAL  TROPONIN I   Imaging Review Dg Chest 2 View  07/29/2013   CLINICAL DATA:  Chest pain, history of CAD and hypertension  EXAM: CHEST  2 VIEW  COMPARISON:  Chest CT -05/26/2010; 12/09/2020 ; right shoulder radiographs -12/15/2012  FINDINGS: Grossly unchanged cardiac silhouette and mediastinal contours with atherosclerotic plaque within the thoracic aorta. Evaluation of the retrosternal clear space obscured secondary to overlying soft tissues. The lungs appear hyperexpanded with flattening of the bilaterally diaphragms a mild diffuse slightly nodular thickening of the pulmonary interstitium. Grossly unchanged approximately 4.5 x 2.8 cm cyst within the peripheral aspect of the  right upper lung, as demonstrated on remote chest CT. Possible ill-defined heterogeneous airspace opacity within the right infrahilar lung worrisome for an area of infection. No definite pleural effusion or pneumothorax. No definite evidence of edema. No acute osseus abnormalities. Post cholecystectomy.  IMPRESSION: 1. Right infrahilar heterogeneous airspace opacities worrisome for an area of developing infection. A follow-up chest radiograph in 4 to 6 weeks after treatment is recommended to ensure resolution.  2. Marked lung hyperexpansion and bronchitic change.   Electronically Signed   By: Simonne Come M.D.   On: 07/29/2013 15:15    Date: 07/29/2013  Rate: 65  Rhythm: normal sinus rhythm  QRS Axis: normal  Intervals: normal  ST/T Wave abnormalities: ST depressions laterally  Conduction Disutrbances:none  Narrative Interpretation:   Old EKG Reviewed: unchanged    EKG Interpretation   None       MDM   1. Community acquired pneumonia    Will treat for pneumonia:pt is okay to follow up with pcp:pt vitals are stable think it is okay to discharge the pt    Teressa Lower, NP 07/29/13 1556

## 2013-08-14 DIAGNOSIS — S83289A Other tear of lateral meniscus, current injury, unspecified knee, initial encounter: Secondary | ICD-10-CM | POA: Diagnosis not present

## 2013-08-30 ENCOUNTER — Other Ambulatory Visit: Payer: Self-pay | Admitting: Geriatric Medicine

## 2013-08-30 ENCOUNTER — Ambulatory Visit
Admission: RE | Admit: 2013-08-30 | Discharge: 2013-08-30 | Disposition: A | Discharge status: Home / Self Care | Payer: Medicare Other | Source: Ambulatory Visit | Attending: Geriatric Medicine | Admitting: Geriatric Medicine

## 2013-08-30 DIAGNOSIS — I10 Essential (primary) hypertension: Secondary | ICD-10-CM | POA: Diagnosis not present

## 2013-08-30 DIAGNOSIS — J189 Pneumonia, unspecified organism: Secondary | ICD-10-CM

## 2013-08-30 DIAGNOSIS — R059 Cough, unspecified: Secondary | ICD-10-CM | POA: Diagnosis not present

## 2013-08-30 DIAGNOSIS — R0789 Other chest pain: Secondary | ICD-10-CM | POA: Diagnosis not present

## 2013-08-30 DIAGNOSIS — R079 Chest pain, unspecified: Secondary | ICD-10-CM | POA: Diagnosis not present

## 2013-08-30 DIAGNOSIS — R05 Cough: Secondary | ICD-10-CM | POA: Diagnosis not present

## 2013-09-04 ENCOUNTER — Encounter: Payer: Self-pay | Admitting: Physician Assistant

## 2013-09-04 ENCOUNTER — Ambulatory Visit (INDEPENDENT_AMBULATORY_CARE_PROVIDER_SITE_OTHER): Payer: Medicare Other | Admitting: Physician Assistant

## 2013-09-04 VITALS — BP 110/60 | HR 54 | Ht 66.0 in | Wt 154.0 lb

## 2013-09-04 DIAGNOSIS — Z720 Tobacco use: Secondary | ICD-10-CM

## 2013-09-04 DIAGNOSIS — F17200 Nicotine dependence, unspecified, uncomplicated: Secondary | ICD-10-CM

## 2013-09-04 DIAGNOSIS — I2510 Atherosclerotic heart disease of native coronary artery without angina pectoris: Secondary | ICD-10-CM | POA: Insufficient documentation

## 2013-09-04 DIAGNOSIS — E785 Hyperlipidemia, unspecified: Secondary | ICD-10-CM | POA: Diagnosis not present

## 2013-09-04 DIAGNOSIS — I10 Essential (primary) hypertension: Secondary | ICD-10-CM | POA: Diagnosis not present

## 2013-09-04 DIAGNOSIS — R079 Chest pain, unspecified: Secondary | ICD-10-CM

## 2013-09-04 NOTE — Patient Instructions (Signed)
Your physician recommends that you continue on your current medications as directed. Please refer to the Current Medication list given to you today.  Your physician has requested that you have en exercise stress myoview. For further information please visit [email or web address]. Please follow instruction sheet, as given.  Your physician wants you to follow-up in: 6 months with Dr.Smith You will receive a reminder letter in the mail two months in advance. If you don't receive a letter, please call our office to schedule the follow-up appointment.

## 2013-09-04 NOTE — Addendum Note (Signed)
Addended by: Larey Dresser on: 09/04/2013 09:46 PM   Modules accepted: Level of Service

## 2013-09-04 NOTE — Progress Notes (Addendum)
[address], [place], [place]  [address] Phone: [phone] Fax:  [phone]  Date:  09/04/2013   ID:  Kim, Sutton [birth year], MRN [id number]  PCP:  Mathews Argyle, MD  Cardiologist:  Dr. Daneen Schick     History of Present Illness: Kim Sutton is a 80 y.o. female smoker with a hx of CAD, HTN, HL.  Patient suffered a non-STEMI in 2001. LHC at that time demonstrated EF 75%, ostial D2 40%, mid RCA 70%, 95%. PCI: BMS to the RCA.  She followed with Dr. Daneen Schick for some time at [facility] but has not been seen in many years.  She recently was treated for community acquired pneumonia in 12.2014.  Her symptoms resolved.  She did recently have some chest tightness.  This was mainly associated with cough.  It has resolved.  She denies exertional chest pain.  She denies significant DOE.  She denies orthopnea, PND, edema.  She denies syncope.  She needs R knee arthroscopic surgery soon with Dr. Len Childs in [facility].    Recent Labs: 07/29/2013: Creatinine 0.80; Hemoglobin 15.0; Potassium 3.6   Wt Readings from Last 3 Encounters:  09/04/13 154 lb (69.854 kg)  07/29/13 152 lb (68.947 kg)     Past Medical History  Diagnosis Date  . CAD (coronary artery disease)     a. s/p NSTEMI in 2001 tx with BMS to RCA  . Hypertension   . HLD (hyperlipidemia)   . Tobacco abuse   . Adrenal cortical adenoma of left adrenal gland     stable since 2009;  MRI in 04/2010 - no further scans  . Nodule of right lung     a. 71mm on CXR 10/2009;  CT 05/2010 ok - no further scans  . Hyperglycemia   . Nephrolithiasis   . Colon polyps   . Myoclonus     on neurontin per neuro at [facility]    Past Surgical History  Procedure Laterality Date  . Coronary angioplasty with stent placement    . Total abdominal hysterectomy    . Cholecystectomy      Current Outpatient Prescriptions  Medication Sig Dispense Refill  . amLODipine (NORVASC) 5 MG tablet       . aspirin 81 MG tablet Take 81 mg by mouth  daily.      Marland Kitchen gabapentin (NEURONTIN) 100 MG capsule Take 100 mg by mouth 3 (three) times daily.      Marland Kitchen lisinopril-hydrochlorothiazide (PRINZIDE,ZESTORETIC) 20-12.5 MG per tablet       . metoprolol succinate (TOPROL-XL) 50 MG 24 hr tablet Take 50 mg by mouth daily. Take with or immediately following a meal.      . simvastatin (ZOCOR) 20 MG tablet Take 20 mg by mouth daily.      Marland Kitchen ZETIA 10 MG tablet        No current facility-administered medications for this visit.    Allergies:   Ceclor   Social History:  The patient  reports that she has been smoking.  She does not have any smokeless tobacco history on file. She reports that she does not drink alcohol.   Family History:  The patient's family history is not on file.   ROS:  Please see the history of present illness.      All other systems reviewed and negative.   PHYSICAL EXAM: VS:  BP 110/60  Pulse 54  Ht 5\' 6"  (1.676 m)  Wt 154 lb (69.854 kg)  BMI  24.87 kg/m2 Well nourished, well developed, in no acute distress HEENT: normal Neck: no JVD Vascular:  No carotid bruits bilaterally; DP/PT 1+ bilaterally Cardiac:  Distant S1, S2; RRR; no murmur Lungs:  Decreased breath sounds bilaterally, no wheezing, rhonchi or rales Abd: soft, nontender, no hepatomegaly Ext: no edema Skin: warm and dry Neuro:  CNs 2-12 intact, no focal abnormalities noted  EKG:  Sinus brady, HR 54, NSSTTW changes  ASSESSMENT AND PLAN:  1. CAD:  Overall stable.  She has not been evaluated in many years.  Her MI was in 2001 when she received a BMS to the RCA.  She has had some chest discomfort recently.  This has been atypical.  She continues to smoke.  She needs knee surgery soon.  Arrange ETT-Myoview (may need to change to [place] if her knee bothers her).  Continue ASA, beta blocker, statin. 2. Surgical Clearance:  Proceed with ETT-Myoview for risk stratification.   3. Hypertension:  Controlled. 4. Hyperlipidemia:  Continue statin.  Managed by  PCP. 5. Tobacco Abuse:  I have recommended she quit. 6. Disposition:  Patient was also seen and examined by Dr. Loralie Champagne (DOD) today.  F/u with Dr. Daneen Schick in 6 mos or sooner if myoview abnormal.    Signed, Richardson Dopp, PA-C  09/04/2013 9:07 AM    Patient seen with PA, agree with the above note.  Patient has atypical chest pain and needs knee surgery.  She has a history of CAD.   - Will arrange Lexiscan Cardiolite (knee pain) - Needs to quit smoking, discussed this with her.   Loralie Champagne 09/04/2013

## 2013-09-18 ENCOUNTER — Encounter (HOSPITAL_COMMUNITY): Payer: Medicare Other

## 2013-09-18 ENCOUNTER — Ambulatory Visit (HOSPITAL_COMMUNITY): Payer: Medicare Other | Attending: Internal Medicine | Admitting: Radiology

## 2013-09-18 VITALS — BP 118/74 | HR 61 | Ht 66.0 in | Wt 154.0 lb

## 2013-09-18 DIAGNOSIS — F17200 Nicotine dependence, unspecified, uncomplicated: Secondary | ICD-10-CM | POA: Insufficient documentation

## 2013-09-18 DIAGNOSIS — E785 Hyperlipidemia, unspecified: Secondary | ICD-10-CM | POA: Insufficient documentation

## 2013-09-18 DIAGNOSIS — I2510 Atherosclerotic heart disease of native coronary artery without angina pectoris: Secondary | ICD-10-CM | POA: Diagnosis not present

## 2013-09-18 DIAGNOSIS — Z9861 Coronary angioplasty status: Secondary | ICD-10-CM | POA: Diagnosis not present

## 2013-09-18 DIAGNOSIS — R079 Chest pain, unspecified: Secondary | ICD-10-CM | POA: Diagnosis not present

## 2013-09-18 DIAGNOSIS — I10 Essential (primary) hypertension: Secondary | ICD-10-CM | POA: Diagnosis not present

## 2013-09-18 DIAGNOSIS — Z01810 Encounter for preprocedural cardiovascular examination: Secondary | ICD-10-CM | POA: Diagnosis not present

## 2013-09-18 DIAGNOSIS — Z8249 Family history of ischemic heart disease and other diseases of the circulatory system: Secondary | ICD-10-CM | POA: Insufficient documentation

## 2013-09-18 DIAGNOSIS — I252 Old myocardial infarction: Secondary | ICD-10-CM | POA: Diagnosis not present

## 2013-09-18 MED ORDER — TECHNETIUM TC 99M SESTAMIBI GENERIC - CARDIOLITE
33.0000 | Freq: Once | INTRAVENOUS | Status: AC | PRN
  Administered 2013-09-18: 33 via INTRAVENOUS

## 2013-09-18 MED ORDER — REGADENOSON 0.4 MG/5ML IV SOLN
0.4000 mg | Freq: Once | INTRAVENOUS | Status: AC
  Administered 2013-09-18: 0.4 mg via INTRAVENOUS

## 2013-09-18 MED ORDER — TECHNETIUM TC 99M SESTAMIBI GENERIC - CARDIOLITE
11.0000 | Freq: Once | INTRAVENOUS | Status: AC | PRN
  Administered 2013-09-18: 11 via INTRAVENOUS

## 2013-09-18 NOTE — Progress Notes (Signed)
[facility] [address] [place], [place] [address] [phone]    Cardiology Nuclear Med Study  URIJAH RAYNOR is a 80 y.o. female     MRN : [id number]     DOB: [birth year]  Procedure Date: 09/18/2013  Nuclear Med Background Indication for Stress Test:  Evaluation for Ischemia; Stent Patency; Surgical Clearance-R TKR 10/16/13 Dr Len Childs ([facility]) History:  CAD; 2001 MI/PTCA/Stent; GXT~28yrs-nml per pt Cardiac Risk Factors: Family History - CAD, Hypertension, Lipids and Smoker  Symptoms:  Chest Pain   Nuclear Pre-Procedure Caffeine/Decaff Intake:  5:00am NPO After: 10:00pm   Lungs:  clear O2 Sat: 96% on room air. IV 0.9% NS with Angio Cath:  22g  IV Site: L Hand  IV Started by:  Matilde Haymaker, RN  Chest Size (in):  36 Cup Size: B  Height: 5\' 6"  (1.676 m)  Weight:  154 lb (69.854 kg)  BMI:  Body mass index is 24.87 kg/(m^2). Tech Comments:  Held [employer] account planner Med Study 1 or 2 day study: 1 day  Stress Test Type:  Lexiscan  Reading MD: N/A  Order Authorizing Provider:  Linard Millers, MD  Resting Radionuclide: Technetium 11m Sestamibi  Resting Radionuclide Dose: 11.0 mCi   Stress Radionuclide:  Technetium 57m Sestamibi  Stress Radionuclide Dose: 33.0 mCi           Stress Protocol Rest HR: 61 Stress HR: 81  Rest BP: 118/74 Stress BP: 134/63  Exercise Time (min): n/a METS: n/a           Dose of Adenosine (mg):  n/a Dose of Lexiscan: 0.4 mg  Dose of Atropine (mg): n/a Dose of Dobutamine: n/a mcg/kg/min (at max HR)  Stress Test Technologist: Glade Lloyd, BS-ES  Nuclear Technologist:  Charlton Amor, CNMT     Rest Procedure:  Myocardial perfusion imaging was performed at rest 45 minutes following the intravenous administration of Technetium 47m Sestamibi. Rest ECG: NSR with non-specific ST-T wave changes  Stress Procedure:  The patient received IV Lexiscan 0.4 mg over 15-seconds.  Technetium 25m Sestamibi injected at 30-seconds.   Quantitative spect images were obtained after a 45 minute delay.  During the infusion of Lexiscan, the patient complained of a warm feeling, stomach cramp and a headache.  These symptoms began to resolve in recovery with the exception of the headache.  Stress ECG: No significant change from baseline ECG  QPS Raw Data Images:  Normal; no motion artifact; normal heart/lung ratio. Stress Images:  Normal homogeneous uptake in all areas of the myocardium. Rest Images:  Normal homogeneous uptake in all areas of the myocardium. Subtraction (SDS):  No evidence of ischemia. Transient Ischemic Dilatation (Normal <1.22):  0.98 Lung/Heart Ratio (Normal <0.45):  0.27  Quantitative Gated Spect Images QGS EDV:  59 ml QGS ESV:  14 ml  Impression Exercise Capacity:  Lexiscan with no exercise. BP Response:  Normal blood pressure response. Clinical Symptoms:  No significant symptoms noted. ECG Impression:  No significant ST segment change suggestive of ischemia. Comparison with Prior Nuclear Study: No images to compare  Overall Impression:  Low risk stress nuclear study with no ischemia. .  LV Ejection Fraction: 77%.  LV Wall Motion:  NL LV Function; NL Wall Motion. Prior RCA stent in setting of NSTEMI in 2001.    Candee Furbish, MD

## 2013-09-20 ENCOUNTER — Telehealth: Payer: Self-pay | Admitting: *Deleted

## 2013-09-20 ENCOUNTER — Encounter: Payer: Self-pay | Admitting: Physician Assistant

## 2013-09-20 DIAGNOSIS — F17200 Nicotine dependence, unspecified, uncomplicated: Secondary | ICD-10-CM | POA: Diagnosis not present

## 2013-09-20 DIAGNOSIS — Z0000 Encounter for general adult medical examination without abnormal findings: Secondary | ICD-10-CM | POA: Diagnosis not present

## 2013-09-20 DIAGNOSIS — Z23 Encounter for immunization: Secondary | ICD-10-CM | POA: Diagnosis not present

## 2013-09-20 DIAGNOSIS — Z1331 Encounter for screening for depression: Secondary | ICD-10-CM | POA: Diagnosis not present

## 2013-09-20 NOTE — Telephone Encounter (Signed)
lmptcb for myoview results 

## 2013-09-20 NOTE — Telephone Encounter (Signed)
Pt's husband notified about myoview results and ok for pt to proceed w/surgery. I will fax Ov and results to Dr. Len Childs @ [facility], husband asked if I will fax to PCP as well.

## 2013-09-20 NOTE — Progress Notes (Signed)
Lexiscan Myoview (09/2013):  No ischemia.  Low Risk.   She may proceed with her non-cardiac surgery at acceptable risk.  No further testing is indicated. Signed,  Richardson Dopp, PA-C   09/20/2013 1:17 PM

## 2013-10-14 DIAGNOSIS — M2240 Chondromalacia patellae, unspecified knee: Secondary | ICD-10-CM | POA: Diagnosis not present

## 2013-10-14 DIAGNOSIS — M2390 Unspecified internal derangement of unspecified knee: Secondary | ICD-10-CM | POA: Diagnosis not present

## 2013-10-14 DIAGNOSIS — I10 Essential (primary) hypertension: Secondary | ICD-10-CM | POA: Diagnosis not present

## 2013-10-15 ENCOUNTER — Telehealth: Payer: Self-pay | Admitting: Interventional Cardiology

## 2013-10-15 NOTE — Telephone Encounter (Signed)
New message     Need clearance for knee surgery---fax clearance to [phone] attn Cathy----Dr lennon

## 2013-10-21 NOTE — Telephone Encounter (Signed)
Cleared for upcoming surgery . Had low risk nuclear study last month.

## 2013-10-21 NOTE — Telephone Encounter (Signed)
Follow up     Need clearance for knee surgery faxed to Dr Len Childs at [facility] number [phone].  Pt states they never received clearance and her surgery is scheduled for wed 10-23-13.

## 2013-10-21 NOTE — Telephone Encounter (Signed)
Cathy from Dr. Juluis Mire office calling to see if the clearance had been received.  Surgery is scheduled for Wed 3/25.  Lm for her that Dr. Tamala Julian nor his CMA is in office today but will message to them.

## 2013-10-22 NOTE — Telephone Encounter (Signed)
Cardiac clearance faxed to Dr.Lennon's office attn: Tye [place] fax [phone]

## 2013-10-23 DIAGNOSIS — M9420 Chondromalacia, unspecified site: Secondary | ICD-10-CM | POA: Diagnosis not present

## 2013-10-23 DIAGNOSIS — M2390 Unspecified internal derangement of unspecified knee: Secondary | ICD-10-CM | POA: Diagnosis not present

## 2013-10-23 DIAGNOSIS — S83289A Other tear of lateral meniscus, current injury, unspecified knee, initial encounter: Secondary | ICD-10-CM | POA: Diagnosis not present

## 2013-10-23 DIAGNOSIS — M2240 Chondromalacia patellae, unspecified knee: Secondary | ICD-10-CM | POA: Diagnosis not present

## 2013-10-23 DIAGNOSIS — M23302 Other meniscus derangements, unspecified lateral meniscus, unspecified knee: Secondary | ICD-10-CM | POA: Diagnosis not present

## 2013-12-02 DIAGNOSIS — G0489 Other myelitis: Secondary | ICD-10-CM | POA: Diagnosis not present

## 2013-12-10 DIAGNOSIS — Z5189 Encounter for other specified aftercare: Secondary | ICD-10-CM | POA: Diagnosis not present

## 2013-12-18 DIAGNOSIS — K219 Gastro-esophageal reflux disease without esophagitis: Secondary | ICD-10-CM | POA: Diagnosis not present

## 2014-03-10 DIAGNOSIS — H109 Unspecified conjunctivitis: Secondary | ICD-10-CM | POA: Diagnosis not present

## 2014-03-21 DIAGNOSIS — Z79899 Other long term (current) drug therapy: Secondary | ICD-10-CM | POA: Diagnosis not present

## 2014-03-21 DIAGNOSIS — G479 Sleep disorder, unspecified: Secondary | ICD-10-CM | POA: Diagnosis not present

## 2014-03-21 DIAGNOSIS — M79609 Pain in unspecified limb: Secondary | ICD-10-CM | POA: Diagnosis not present

## 2014-03-21 DIAGNOSIS — I10 Essential (primary) hypertension: Secondary | ICD-10-CM | POA: Diagnosis not present

## 2014-03-21 DIAGNOSIS — F17200 Nicotine dependence, unspecified, uncomplicated: Secondary | ICD-10-CM | POA: Diagnosis not present

## 2014-03-21 DIAGNOSIS — E7800 Pure hypercholesterolemia, unspecified: Secondary | ICD-10-CM | POA: Diagnosis not present

## 2014-03-24 DIAGNOSIS — M25469 Effusion, unspecified knee: Secondary | ICD-10-CM | POA: Diagnosis not present

## 2014-03-24 DIAGNOSIS — M25569 Pain in unspecified knee: Secondary | ICD-10-CM | POA: Diagnosis not present

## 2014-03-25 DIAGNOSIS — H10509 Unspecified blepharoconjunctivitis, unspecified eye: Secondary | ICD-10-CM | POA: Diagnosis not present

## 2014-03-31 DIAGNOSIS — M25569 Pain in unspecified knee: Secondary | ICD-10-CM | POA: Diagnosis not present

## 2014-03-31 DIAGNOSIS — M1710 Unilateral primary osteoarthritis, unspecified knee: Secondary | ICD-10-CM | POA: Diagnosis not present

## 2014-03-31 DIAGNOSIS — Z9889 Other specified postprocedural states: Secondary | ICD-10-CM | POA: Diagnosis not present

## 2014-03-31 DIAGNOSIS — R937 Abnormal findings on diagnostic imaging of other parts of musculoskeletal system: Secondary | ICD-10-CM | POA: Diagnosis not present

## 2014-04-11 DIAGNOSIS — M6281 Muscle weakness (generalized): Secondary | ICD-10-CM | POA: Diagnosis not present

## 2014-04-11 DIAGNOSIS — M25569 Pain in unspecified knee: Secondary | ICD-10-CM | POA: Diagnosis not present

## 2014-04-11 DIAGNOSIS — R269 Unspecified abnormalities of gait and mobility: Secondary | ICD-10-CM | POA: Diagnosis not present

## 2014-04-11 DIAGNOSIS — IMO0002 Reserved for concepts with insufficient information to code with codable children: Secondary | ICD-10-CM | POA: Diagnosis not present

## 2014-04-15 DIAGNOSIS — IMO0002 Reserved for concepts with insufficient information to code with codable children: Secondary | ICD-10-CM | POA: Diagnosis not present

## 2014-04-15 DIAGNOSIS — M25569 Pain in unspecified knee: Secondary | ICD-10-CM | POA: Diagnosis not present

## 2014-04-15 DIAGNOSIS — R269 Unspecified abnormalities of gait and mobility: Secondary | ICD-10-CM | POA: Diagnosis not present

## 2014-04-15 DIAGNOSIS — M6281 Muscle weakness (generalized): Secondary | ICD-10-CM | POA: Diagnosis not present

## 2014-04-18 DIAGNOSIS — M6281 Muscle weakness (generalized): Secondary | ICD-10-CM | POA: Diagnosis not present

## 2014-04-18 DIAGNOSIS — M25569 Pain in unspecified knee: Secondary | ICD-10-CM | POA: Diagnosis not present

## 2014-04-18 DIAGNOSIS — R269 Unspecified abnormalities of gait and mobility: Secondary | ICD-10-CM | POA: Diagnosis not present

## 2014-04-18 DIAGNOSIS — IMO0002 Reserved for concepts with insufficient information to code with codable children: Secondary | ICD-10-CM | POA: Diagnosis not present

## 2014-04-22 DIAGNOSIS — M6281 Muscle weakness (generalized): Secondary | ICD-10-CM | POA: Diagnosis not present

## 2014-04-22 DIAGNOSIS — IMO0002 Reserved for concepts with insufficient information to code with codable children: Secondary | ICD-10-CM | POA: Diagnosis not present

## 2014-04-22 DIAGNOSIS — R269 Unspecified abnormalities of gait and mobility: Secondary | ICD-10-CM | POA: Diagnosis not present

## 2014-04-22 DIAGNOSIS — M25569 Pain in unspecified knee: Secondary | ICD-10-CM | POA: Diagnosis not present

## 2014-04-25 DIAGNOSIS — IMO0002 Reserved for concepts with insufficient information to code with codable children: Secondary | ICD-10-CM | POA: Diagnosis not present

## 2014-04-25 DIAGNOSIS — M25569 Pain in unspecified knee: Secondary | ICD-10-CM | POA: Diagnosis not present

## 2014-04-25 DIAGNOSIS — M6281 Muscle weakness (generalized): Secondary | ICD-10-CM | POA: Diagnosis not present

## 2014-04-25 DIAGNOSIS — R269 Unspecified abnormalities of gait and mobility: Secondary | ICD-10-CM | POA: Diagnosis not present

## 2014-04-29 DIAGNOSIS — R269 Unspecified abnormalities of gait and mobility: Secondary | ICD-10-CM | POA: Diagnosis not present

## 2014-04-29 DIAGNOSIS — M6281 Muscle weakness (generalized): Secondary | ICD-10-CM | POA: Diagnosis not present

## 2014-04-29 DIAGNOSIS — IMO0002 Reserved for concepts with insufficient information to code with codable children: Secondary | ICD-10-CM | POA: Diagnosis not present

## 2014-04-29 DIAGNOSIS — M25569 Pain in unspecified knee: Secondary | ICD-10-CM | POA: Diagnosis not present

## 2014-05-02 DIAGNOSIS — S83231D Complex tear of medial meniscus, current injury, right knee, subsequent encounter: Secondary | ICD-10-CM | POA: Diagnosis not present

## 2014-05-02 DIAGNOSIS — M25561 Pain in right knee: Secondary | ICD-10-CM | POA: Diagnosis not present

## 2014-05-02 DIAGNOSIS — M6281 Muscle weakness (generalized): Secondary | ICD-10-CM | POA: Diagnosis not present

## 2014-05-05 DIAGNOSIS — M1711 Unilateral primary osteoarthritis, right knee: Secondary | ICD-10-CM | POA: Diagnosis not present

## 2014-05-06 DIAGNOSIS — S83231D Complex tear of medial meniscus, current injury, right knee, subsequent encounter: Secondary | ICD-10-CM | POA: Diagnosis not present

## 2014-05-06 DIAGNOSIS — M6281 Muscle weakness (generalized): Secondary | ICD-10-CM | POA: Diagnosis not present

## 2014-05-06 DIAGNOSIS — M25561 Pain in right knee: Secondary | ICD-10-CM | POA: Diagnosis not present

## 2014-05-07 DIAGNOSIS — F17200 Nicotine dependence, unspecified, uncomplicated: Secondary | ICD-10-CM | POA: Diagnosis not present

## 2014-05-09 DIAGNOSIS — M6281 Muscle weakness (generalized): Secondary | ICD-10-CM | POA: Diagnosis not present

## 2014-05-09 DIAGNOSIS — S83231D Complex tear of medial meniscus, current injury, right knee, subsequent encounter: Secondary | ICD-10-CM | POA: Diagnosis not present

## 2014-05-09 DIAGNOSIS — M25561 Pain in right knee: Secondary | ICD-10-CM | POA: Diagnosis not present

## 2014-05-13 DIAGNOSIS — M6281 Muscle weakness (generalized): Secondary | ICD-10-CM | POA: Diagnosis not present

## 2014-05-13 DIAGNOSIS — I10 Essential (primary) hypertension: Secondary | ICD-10-CM | POA: Diagnosis not present

## 2014-05-13 DIAGNOSIS — S83231D Complex tear of medial meniscus, current injury, right knee, subsequent encounter: Secondary | ICD-10-CM | POA: Diagnosis not present

## 2014-05-13 DIAGNOSIS — F17200 Nicotine dependence, unspecified, uncomplicated: Secondary | ICD-10-CM | POA: Diagnosis not present

## 2014-05-13 DIAGNOSIS — M25561 Pain in right knee: Secondary | ICD-10-CM | POA: Diagnosis not present

## 2014-05-13 DIAGNOSIS — J449 Chronic obstructive pulmonary disease, unspecified: Secondary | ICD-10-CM | POA: Diagnosis not present

## 2014-05-27 DIAGNOSIS — M25561 Pain in right knee: Secondary | ICD-10-CM | POA: Diagnosis not present

## 2014-05-27 DIAGNOSIS — M6281 Muscle weakness (generalized): Secondary | ICD-10-CM | POA: Diagnosis not present

## 2014-05-27 DIAGNOSIS — S83231D Complex tear of medial meniscus, current injury, right knee, subsequent encounter: Secondary | ICD-10-CM | POA: Diagnosis not present

## 2014-05-30 DIAGNOSIS — M6281 Muscle weakness (generalized): Secondary | ICD-10-CM | POA: Diagnosis not present

## 2014-05-30 DIAGNOSIS — M25561 Pain in right knee: Secondary | ICD-10-CM | POA: Diagnosis not present

## 2014-05-30 DIAGNOSIS — S83231D Complex tear of medial meniscus, current injury, right knee, subsequent encounter: Secondary | ICD-10-CM | POA: Diagnosis not present

## 2014-06-03 DIAGNOSIS — S83231D Complex tear of medial meniscus, current injury, right knee, subsequent encounter: Secondary | ICD-10-CM | POA: Diagnosis not present

## 2014-06-03 DIAGNOSIS — M6281 Muscle weakness (generalized): Secondary | ICD-10-CM | POA: Diagnosis not present

## 2014-06-03 DIAGNOSIS — M25561 Pain in right knee: Secondary | ICD-10-CM | POA: Diagnosis not present

## 2014-06-06 DIAGNOSIS — M25561 Pain in right knee: Secondary | ICD-10-CM | POA: Diagnosis not present

## 2014-06-06 DIAGNOSIS — S83231D Complex tear of medial meniscus, current injury, right knee, subsequent encounter: Secondary | ICD-10-CM | POA: Diagnosis not present

## 2014-06-06 DIAGNOSIS — M6281 Muscle weakness (generalized): Secondary | ICD-10-CM | POA: Diagnosis not present

## 2014-06-10 DIAGNOSIS — J449 Chronic obstructive pulmonary disease, unspecified: Secondary | ICD-10-CM | POA: Diagnosis not present

## 2014-06-10 DIAGNOSIS — R05 Cough: Secondary | ICD-10-CM | POA: Diagnosis not present

## 2014-06-13 ENCOUNTER — Ambulatory Visit (INDEPENDENT_AMBULATORY_CARE_PROVIDER_SITE_OTHER): Payer: Medicare Other | Admitting: Interventional Cardiology

## 2014-06-13 ENCOUNTER — Encounter: Payer: Self-pay | Admitting: Interventional Cardiology

## 2014-06-13 VITALS — BP 127/60 | HR 77 | Ht 67.2 in | Wt 142.0 lb

## 2014-06-13 DIAGNOSIS — Z720 Tobacco use: Secondary | ICD-10-CM

## 2014-06-13 DIAGNOSIS — I10 Essential (primary) hypertension: Secondary | ICD-10-CM | POA: Diagnosis not present

## 2014-06-13 DIAGNOSIS — I2510 Atherosclerotic heart disease of native coronary artery without angina pectoris: Secondary | ICD-10-CM | POA: Diagnosis not present

## 2014-06-13 DIAGNOSIS — E785 Hyperlipidemia, unspecified: Secondary | ICD-10-CM | POA: Diagnosis not present

## 2014-06-13 NOTE — Patient Instructions (Signed)
Your physician recommends that you continue on your current medications as directed. Please refer to the Current Medication list given to you today.  Your physician discussed the hazards of tobacco use. Tobacco use cessation is recommended and techniques and options to help you quit were discussed.   Your physician wants you to follow-up in: 1 year You will receive a reminder letter in the mail two months in advance. If you don't receive a letter, please call our office to schedule the follow-up appointment.

## 2014-06-13 NOTE — Progress Notes (Signed)
Patient ID: Kim Sutton, female   DOB: 1933-12-21, 80 y.o.   MRN: [id number]    [address] [place], [place]  [address] Phone: [phone] Fax:  [phone]  Date:  06/13/2014   ID:  Kim Sutton, DOB Sep 15, 1933, MRN [id number]  PCP:  Mathews Argyle, MD   ASSESSMENT:  1. Coronary artery disease with prior stent, asymptomatic. Stenting occurred 15 years ago 2. Hyperlipidemia 3. Essential hypertension, controlled 4. Tobacco abuse, patient is currently in the process of quitting  PLAN:  1. Encouraged patient to continue with smoking cessation 2. Aerobic activity 3. Clinical follow-up 1 year   SUBJECTIVE: Kim Sutton is a 80 y.o. female who has not had angina. Denies syncope and palpitations. Recent congestion not associated with edema orthopnea. Not clearing with antibiotics.   Wt Readings from Last 3 Encounters:  06/13/14 142 lb (64.411 kg)  09/18/13 154 lb (69.854 kg)  09/04/13 154 lb (69.854 kg)     Past Medical History  Diagnosis Date  . CAD (coronary artery disease)     a. s/p NSTEMI in 2001 tx with BMS to RCA  . Hypertension   . HLD (hyperlipidemia)   . Tobacco abuse   . Adrenal cortical adenoma of left adrenal gland     stable since 2009;  MRI in 04/2010 - no further scans  . Nodule of right lung     a. 66mm on CXR 10/2009;  CT 05/2010 ok - no further scans  . Hyperglycemia   . Nephrolithiasis   . Colon polyps   . Myoclonus     on neurontin per neuro at [facility]  . Hx of cardiovascular stress test     Lexiscan Myoview (09/2013):  No ischemia, EF 77%, Low Risk.    Current Outpatient Prescriptions  Medication Sig Dispense Refill  . amLODipine (NORVASC) 5 MG tablet     . aspirin 81 MG tablet Take 81 mg by mouth daily.    Marland Kitchen lisinopril-hydrochlorothiazide (PRINZIDE,ZESTORETIC) 20-12.5 MG per tablet     . metoprolol succinate (TOPROL-XL) 50 MG 24 hr tablet Take 50 mg by mouth daily. Take with or immediately following a meal.    .  simvastatin (ZOCOR) 20 MG tablet Take 20 mg by mouth daily.    Marland Kitchen ZETIA 10 MG tablet     . vitamin B-12 (CYANOCOBALAMIN) 1000 MCG tablet Take by mouth.     No current facility-administered medications for this visit.    Allergies:    Allergies  Allergen Reactions  . Ceclor [Cefaclor] Hives and Anaphylaxis    Social History:  The patient  reports that she has been smoking.  She does not have any smokeless tobacco history on file. She reports that she does not drink alcohol.   ROS:  Please see the history of present illness.   Denies syncope, palpitations, edema, claudication.   All other systems reviewed and negative.   OBJECTIVE: VS:  BP 127/60 mmHg  Pulse 77  Ht 5' 7.2" (1.707 m)  Wt 142 lb (64.411 kg)  BMI 22.11 kg/m2 Well nourished, well developed, in no acute distress, appears older than stated age 28: normal Neck: JVD flat. Carotid bruit absent  Cardiac:  normal S1, S2; RRR; no murmur Lungs:  clear to auscultation bilaterally, no wheezing, rhonchi or rales Abd: soft, nontender, no hepatomegaly Ext: Edema absent. Pulses 1-2+ bilateral Skin: warm and dry Neuro:  CNs 2-12 intact, no focal abnormalities noted  EKG:  Not performed  Signed, Lyons, MD 06/13/2014 12:12 PM

## 2014-06-16 DIAGNOSIS — M1711 Unilateral primary osteoarthritis, right knee: Secondary | ICD-10-CM | POA: Diagnosis not present

## 2014-06-20 ENCOUNTER — Other Ambulatory Visit: Payer: Self-pay | Admitting: Geriatric Medicine

## 2014-06-20 DIAGNOSIS — M7712 Lateral epicondylitis, left elbow: Secondary | ICD-10-CM | POA: Diagnosis not present

## 2014-06-20 DIAGNOSIS — B3781 Candidal esophagitis: Secondary | ICD-10-CM | POA: Diagnosis not present

## 2014-06-20 DIAGNOSIS — Z23 Encounter for immunization: Secondary | ICD-10-CM | POA: Diagnosis not present

## 2014-06-20 DIAGNOSIS — F17211 Nicotine dependence, cigarettes, in remission: Secondary | ICD-10-CM | POA: Diagnosis not present

## 2014-06-20 DIAGNOSIS — J449 Chronic obstructive pulmonary disease, unspecified: Secondary | ICD-10-CM | POA: Diagnosis not present

## 2014-06-24 ENCOUNTER — Other Ambulatory Visit: Payer: Medicare Other

## 2014-08-05 DIAGNOSIS — H01002 Unspecified blepharitis right lower eyelid: Secondary | ICD-10-CM | POA: Diagnosis not present

## 2014-08-05 DIAGNOSIS — H01001 Unspecified blepharitis right upper eyelid: Secondary | ICD-10-CM | POA: Diagnosis not present

## 2014-08-12 DIAGNOSIS — H01001 Unspecified blepharitis right upper eyelid: Secondary | ICD-10-CM | POA: Diagnosis not present

## 2014-08-12 DIAGNOSIS — H01002 Unspecified blepharitis right lower eyelid: Secondary | ICD-10-CM | POA: Diagnosis not present

## 2014-08-12 DIAGNOSIS — H3531 Nonexudative age-related macular degeneration: Secondary | ICD-10-CM | POA: Diagnosis not present

## 2014-08-12 DIAGNOSIS — H52223 Regular astigmatism, bilateral: Secondary | ICD-10-CM | POA: Diagnosis not present

## 2014-08-12 DIAGNOSIS — H524 Presbyopia: Secondary | ICD-10-CM | POA: Diagnosis not present

## 2014-09-29 DIAGNOSIS — I2510 Atherosclerotic heart disease of native coronary artery without angina pectoris: Secondary | ICD-10-CM | POA: Diagnosis not present

## 2014-09-29 DIAGNOSIS — E780 Pure hypercholesterolemia: Secondary | ICD-10-CM | POA: Diagnosis not present

## 2014-09-29 DIAGNOSIS — J449 Chronic obstructive pulmonary disease, unspecified: Secondary | ICD-10-CM | POA: Diagnosis not present

## 2014-09-29 DIAGNOSIS — M25512 Pain in left shoulder: Secondary | ICD-10-CM | POA: Diagnosis not present

## 2014-09-29 DIAGNOSIS — I872 Venous insufficiency (chronic) (peripheral): Secondary | ICD-10-CM | POA: Diagnosis not present

## 2014-09-29 DIAGNOSIS — Z79899 Other long term (current) drug therapy: Secondary | ICD-10-CM | POA: Diagnosis not present

## 2014-10-14 DIAGNOSIS — M25512 Pain in left shoulder: Secondary | ICD-10-CM | POA: Diagnosis not present

## 2014-10-14 DIAGNOSIS — M25511 Pain in right shoulder: Secondary | ICD-10-CM | POA: Diagnosis not present

## 2014-12-01 DIAGNOSIS — M545 Low back pain: Secondary | ICD-10-CM | POA: Diagnosis not present

## 2014-12-01 DIAGNOSIS — M6281 Muscle weakness (generalized): Secondary | ICD-10-CM | POA: Diagnosis not present

## 2014-12-01 DIAGNOSIS — G373 Acute transverse myelitis in demyelinating disease of central nervous system: Secondary | ICD-10-CM | POA: Diagnosis not present

## 2014-12-01 DIAGNOSIS — M25561 Pain in right knee: Secondary | ICD-10-CM | POA: Diagnosis not present

## 2014-12-01 DIAGNOSIS — G8929 Other chronic pain: Secondary | ICD-10-CM | POA: Diagnosis not present

## 2015-01-07 DIAGNOSIS — H01002 Unspecified blepharitis right lower eyelid: Secondary | ICD-10-CM | POA: Diagnosis not present

## 2015-01-07 DIAGNOSIS — H01001 Unspecified blepharitis right upper eyelid: Secondary | ICD-10-CM | POA: Diagnosis not present

## 2015-02-03 DIAGNOSIS — H6691 Otitis media, unspecified, right ear: Secondary | ICD-10-CM | POA: Diagnosis not present

## 2015-03-20 DIAGNOSIS — L081 Erythrasma: Secondary | ICD-10-CM | POA: Diagnosis not present

## 2015-03-20 DIAGNOSIS — Z23 Encounter for immunization: Secondary | ICD-10-CM | POA: Diagnosis not present

## 2015-04-07 DIAGNOSIS — Z0000 Encounter for general adult medical examination without abnormal findings: Secondary | ICD-10-CM | POA: Diagnosis not present

## 2015-04-07 DIAGNOSIS — Z1389 Encounter for screening for other disorder: Secondary | ICD-10-CM | POA: Diagnosis not present

## 2015-04-07 DIAGNOSIS — B372 Candidiasis of skin and nail: Secondary | ICD-10-CM | POA: Diagnosis not present

## 2015-04-07 DIAGNOSIS — Z79899 Other long term (current) drug therapy: Secondary | ICD-10-CM | POA: Diagnosis not present

## 2015-04-07 DIAGNOSIS — J449 Chronic obstructive pulmonary disease, unspecified: Secondary | ICD-10-CM | POA: Diagnosis not present

## 2015-04-07 DIAGNOSIS — M810 Age-related osteoporosis without current pathological fracture: Secondary | ICD-10-CM | POA: Diagnosis not present

## 2015-04-07 DIAGNOSIS — E780 Pure hypercholesterolemia: Secondary | ICD-10-CM | POA: Diagnosis not present

## 2015-05-04 DIAGNOSIS — M810 Age-related osteoporosis without current pathological fracture: Secondary | ICD-10-CM | POA: Diagnosis not present

## 2015-05-20 DIAGNOSIS — M810 Age-related osteoporosis without current pathological fracture: Secondary | ICD-10-CM | POA: Diagnosis not present

## 2015-05-20 DIAGNOSIS — K219 Gastro-esophageal reflux disease without esophagitis: Secondary | ICD-10-CM | POA: Diagnosis not present

## 2015-08-17 DIAGNOSIS — H353131 Nonexudative age-related macular degeneration, bilateral, early dry stage: Secondary | ICD-10-CM | POA: Diagnosis not present

## 2015-10-05 DIAGNOSIS — I10 Essential (primary) hypertension: Secondary | ICD-10-CM | POA: Diagnosis not present

## 2015-10-05 DIAGNOSIS — Z1239 Encounter for other screening for malignant neoplasm of breast: Secondary | ICD-10-CM | POA: Diagnosis not present

## 2015-10-05 DIAGNOSIS — M810 Age-related osteoporosis without current pathological fracture: Secondary | ICD-10-CM | POA: Diagnosis not present

## 2015-10-05 DIAGNOSIS — I2510 Atherosclerotic heart disease of native coronary artery without angina pectoris: Secondary | ICD-10-CM | POA: Diagnosis not present

## 2015-10-26 ENCOUNTER — Other Ambulatory Visit: Payer: Self-pay

## 2015-10-26 DIAGNOSIS — R5381 Other malaise: Secondary | ICD-10-CM

## 2015-11-05 ENCOUNTER — Other Ambulatory Visit: Payer: Self-pay | Admitting: Geriatric Medicine

## 2015-11-05 DIAGNOSIS — N644 Mastodynia: Secondary | ICD-10-CM

## 2015-11-11 ENCOUNTER — Ambulatory Visit
Admission: RE | Admit: 2015-11-11 | Discharge: 2015-11-11 | Disposition: A | Discharge status: Home / Self Care | Payer: Medicare Other | Source: Ambulatory Visit | Attending: Geriatric Medicine | Admitting: Geriatric Medicine

## 2015-11-11 DIAGNOSIS — N644 Mastodynia: Secondary | ICD-10-CM

## 2015-11-11 DIAGNOSIS — N63 Unspecified lump in breast: Secondary | ICD-10-CM | POA: Diagnosis not present

## 2015-11-11 DIAGNOSIS — N6002 Solitary cyst of left breast: Secondary | ICD-10-CM | POA: Diagnosis not present

## 2015-11-30 DIAGNOSIS — G373 Acute transverse myelitis in demyelinating disease of central nervous system: Secondary | ICD-10-CM | POA: Diagnosis not present

## 2015-11-30 DIAGNOSIS — R202 Paresthesia of skin: Secondary | ICD-10-CM | POA: Diagnosis not present

## 2015-11-30 DIAGNOSIS — R200 Anesthesia of skin: Secondary | ICD-10-CM | POA: Diagnosis not present

## 2015-12-10 DIAGNOSIS — R05 Cough: Secondary | ICD-10-CM | POA: Diagnosis not present

## 2015-12-10 DIAGNOSIS — J019 Acute sinusitis, unspecified: Secondary | ICD-10-CM | POA: Diagnosis not present

## 2016-05-25 DIAGNOSIS — Z23 Encounter for immunization: Secondary | ICD-10-CM | POA: Diagnosis not present

## 2016-05-25 DIAGNOSIS — Z79899 Other long term (current) drug therapy: Secondary | ICD-10-CM | POA: Diagnosis not present

## 2016-05-25 DIAGNOSIS — F5101 Primary insomnia: Secondary | ICD-10-CM | POA: Diagnosis not present

## 2016-05-25 DIAGNOSIS — Z0000 Encounter for general adult medical examination without abnormal findings: Secondary | ICD-10-CM | POA: Diagnosis not present

## 2016-05-25 DIAGNOSIS — E7800 Pure hypercholesterolemia, unspecified: Secondary | ICD-10-CM | POA: Diagnosis not present

## 2016-05-25 DIAGNOSIS — J449 Chronic obstructive pulmonary disease, unspecified: Secondary | ICD-10-CM | POA: Diagnosis not present

## 2016-05-25 DIAGNOSIS — I872 Venous insufficiency (chronic) (peripheral): Secondary | ICD-10-CM | POA: Diagnosis not present

## 2016-05-25 DIAGNOSIS — Z1389 Encounter for screening for other disorder: Secondary | ICD-10-CM | POA: Diagnosis not present

## 2016-11-23 DIAGNOSIS — I10 Essential (primary) hypertension: Secondary | ICD-10-CM | POA: Diagnosis not present

## 2016-11-23 DIAGNOSIS — R0789 Other chest pain: Secondary | ICD-10-CM | POA: Diagnosis not present

## 2016-11-23 DIAGNOSIS — Z0000 Encounter for general adult medical examination without abnormal findings: Secondary | ICD-10-CM | POA: Diagnosis not present

## 2016-11-23 DIAGNOSIS — J449 Chronic obstructive pulmonary disease, unspecified: Secondary | ICD-10-CM | POA: Diagnosis not present

## 2017-02-07 DIAGNOSIS — Z961 Presence of intraocular lens: Secondary | ICD-10-CM | POA: Diagnosis not present

## 2017-02-07 DIAGNOSIS — H524 Presbyopia: Secondary | ICD-10-CM | POA: Diagnosis not present

## 2017-04-04 DIAGNOSIS — R05 Cough: Secondary | ICD-10-CM | POA: Diagnosis not present

## 2017-04-04 DIAGNOSIS — J22 Unspecified acute lower respiratory infection: Secondary | ICD-10-CM | POA: Diagnosis not present

## 2017-04-22 ENCOUNTER — Emergency Department (HOSPITAL_BASED_OUTPATIENT_CLINIC_OR_DEPARTMENT_OTHER): Payer: Medicare Other

## 2017-04-22 ENCOUNTER — Emergency Department (HOSPITAL_BASED_OUTPATIENT_CLINIC_OR_DEPARTMENT_OTHER)
Admission: EM | Admit: 2017-04-22 | Discharge: 2017-04-23 | Disposition: A | Discharge status: Home / Self Care | Payer: Medicare Other | Attending: Emergency Medicine | Admitting: Emergency Medicine

## 2017-04-22 ENCOUNTER — Encounter (HOSPITAL_BASED_OUTPATIENT_CLINIC_OR_DEPARTMENT_OTHER): Payer: Self-pay | Admitting: Adult Health

## 2017-04-22 DIAGNOSIS — I10 Essential (primary) hypertension: Secondary | ICD-10-CM | POA: Diagnosis not present

## 2017-04-22 DIAGNOSIS — Z79899 Other long term (current) drug therapy: Secondary | ICD-10-CM | POA: Insufficient documentation

## 2017-04-22 DIAGNOSIS — R55 Syncope and collapse: Secondary | ICD-10-CM | POA: Diagnosis not present

## 2017-04-22 DIAGNOSIS — I2510 Atherosclerotic heart disease of native coronary artery without angina pectoris: Secondary | ICD-10-CM | POA: Insufficient documentation

## 2017-04-22 DIAGNOSIS — R42 Dizziness and giddiness: Secondary | ICD-10-CM

## 2017-04-22 DIAGNOSIS — Z7982 Long term (current) use of aspirin: Secondary | ICD-10-CM | POA: Diagnosis not present

## 2017-04-22 DIAGNOSIS — F17200 Nicotine dependence, unspecified, uncomplicated: Secondary | ICD-10-CM | POA: Insufficient documentation

## 2017-04-22 DIAGNOSIS — I6522 Occlusion and stenosis of left carotid artery: Secondary | ICD-10-CM | POA: Insufficient documentation

## 2017-04-22 DIAGNOSIS — R51 Headache: Secondary | ICD-10-CM | POA: Diagnosis not present

## 2017-04-22 DIAGNOSIS — I700 Atherosclerosis of aorta: Secondary | ICD-10-CM | POA: Diagnosis not present

## 2017-04-22 DIAGNOSIS — E785 Hyperlipidemia, unspecified: Secondary | ICD-10-CM | POA: Insufficient documentation

## 2017-04-22 LAB — BASIC METABOLIC PANEL
Anion gap: 8 (ref 5–15)
BUN: 16 mg/dL (ref 6–20)
CO2: 30 mmol/L (ref 22–32)
CREATININE: 1.17 mg/dL — AB (ref 0.44–1.00)
Calcium: 9.4 mg/dL (ref 8.9–10.3)
Chloride: 98 mmol/L — ABNORMAL LOW (ref 101–111)
GFR calc Af Amer: 49 mL/min — ABNORMAL LOW (ref >60)
GFR calc non Af Amer: 43 mL/min — ABNORMAL LOW (ref >60)
Glucose, Bld: 155 mg/dL — ABNORMAL HIGH (ref 65–99)
Potassium: 3.2 mmol/L — ABNORMAL LOW (ref 3.5–5.1)
Sodium: 136 mmol/L (ref 135–145)

## 2017-04-22 LAB — CBC
HEMATOCRIT: 39.2 % (ref 36.0–46.0)
HEMOGLOBIN: 13.4 g/dL (ref 12.0–15.0)
MCH: 31.7 pg (ref 26.0–34.0)
MCHC: 34.2 g/dL (ref 30.0–36.0)
MCV: 92.7 fL (ref 78.0–100.0)
Platelets: 191 10*3/uL (ref 150–400)
RBC: 4.23 MIL/uL (ref 3.87–5.11)
RDW: 13.9 % (ref 11.5–15.5)
WBC: 6.4 10*3/uL (ref 4.0–10.5)

## 2017-04-22 LAB — TROPONIN I

## 2017-04-22 MED ORDER — IOPAMIDOL (ISOVUE-370) INJECTION 76%
100.0000 mL | Freq: Once | INTRAVENOUS | Status: AC | PRN
Start: 2017-04-22 — End: 2017-04-22
  Administered 2017-04-22: 80 mL via INTRAVENOUS

## 2017-04-22 NOTE — ED Notes (Signed)
Back from CTA, no changes, remains sx free, alert, NAD, calm, interactive.

## 2017-04-22 NOTE — ED Notes (Addendum)
Back from xray. EDP into room, prior to RN assessment, see MD notes, protocol orders resulted/ reviewed, pending orders. VSS.

## 2017-04-22 NOTE — ED Notes (Signed)
PT ambulated to restroom with a steady gait, denied dizziness and without assistance

## 2017-04-22 NOTE — ED Notes (Signed)
Tolerated tilts well, denies dizziness or other sx, "feel normal".

## 2017-04-22 NOTE — ED Notes (Signed)
Delay in xray, RN wants to put pt on monitor first.

## 2017-04-22 NOTE — ED Provider Notes (Addendum)
[place] [facility] Provider Note   CSN: [id number] Arrival date & time: 04/22/17  1859     History   Chief Complaint Chief Complaint  Patient presents with  . Near Syncope    HPI Kim Sutton is a 83 y.o. female.  HPI Pt with hx of CAD, tobacco use in the recent past comes in with cc of dizziness. Pt reports that for the last 3-4 days she has been getting repeated episodes of dizziness, described as lightheadedness and feeling that she might faint. Each time the episode lasts for 20-30 minutes before it resolves. Pt gets repeated episodes. PT has no associated numbness, tingling, weakness, vision change. She denies any associated chest pain, dib, palpitations. Pt has no prodrome. Pt denies any similar hx in the past. Pt states that her symptoms only occur when she is walking and never when she os laying. Pt has no prodrome prior to her symptoms. ROS is + for pain in the back of her head and neck. Pt cannot think of any specific evoking factors, precipitating ore relieving factors.  Past Medical History:  Diagnosis Date  . Adrenal cortical adenoma of left adrenal gland    stable since 2009;  MRI in 04/2010 - no further scans  . CAD (coronary artery disease)    a. s/p NSTEMI in 2001 tx with BMS to RCA  . Colon polyps   . HLD (hyperlipidemia)   . Hx of cardiovascular stress test    Lexiscan Myoview (09/2013):  No ischemia, EF 77%, Low Risk.  Marland Kitchen Hyperglycemia   . Hypertension   . Myoclonus    on neurontin per neuro at [facility]  . Nephrolithiasis   . Nodule of right lung    a. 60mm on CXR 10/2009;  CT 05/2010 ok - no further scans  . Tobacco abuse     Patient Active Problem List   Diagnosis Date Noted  . CAD (coronary artery disease) 09/04/2013  . HTN (hypertension) 09/04/2013  . HLD (hyperlipidemia) 09/04/2013  . Tobacco abuse 09/04/2013    Past Surgical History:  Procedure Laterality Date  . CHOLECYSTECTOMY    . CORONARY ANGIOPLASTY WITH STENT PLACEMENT    . TOTAL  ABDOMINAL HYSTERECTOMY      OB History    No data available       Home Medications    Prior to Admission medications   Medication Sig Start Date End Date Taking? Authorizing Provider  amLODipine (NORVASC) 5 MG tablet  07/22/13   [provider]  aspirin 81 MG tablet Take 81 mg by mouth daily.    [provider]  lisinopril-hydrochlorothiazide Reita May) 20-12.5 MG per tablet  07/22/13   [provider]  metoprolol succinate (TOPROL-XL) 50 MG 24 hr tablet Take 50 mg by mouth daily. Take with or immediately following a meal.    [provider]  simvastatin (ZOCOR) 20 MG tablet Take 20 mg by mouth daily.    [provider]  vitamin B-12 (CYANOCOBALAMIN) 1000 MCG tablet Take by mouth.    [provider]  ZETIA 10 MG tablet  07/22/13   [provider]    Family History History reviewed. No pertinent family history.  Social History Social History  Substance Use Topics  . Smoking status: Current Every Day Smoker  . Smokeless tobacco: Not on file  . Alcohol use No     Allergies   Ceclor [cefaclor]   Review of Systems Review of Systems  Constitutional: Positive for activity change.  Respiratory: Negative for shortness of breath.   Cardiovascular: Negative for chest pain and palpitations.  Gastrointestinal: Negative for abdominal pain.  Neurological: Positive for dizziness, light-headedness and headaches. Negative for tremors, seizures, syncope, speech difficulty, weakness and numbness.  All other systems reviewed and are negative.  Physical Exam Updated Vital Signs BP 113/68   Pulse 63   Temp 98.8 F (37.1 C) (Oral)   Resp 15   Wt 77.1 kg (170 lb)   SpO2 95%   BMI 26.47 kg/m   Physical Exam  Constitutional: She is oriented to person, place, and time. She appears well-developed.  HENT:  Head: Normocephalic and atraumatic.  Eyes: EOM are normal.  Neck: Normal range of motion. Neck supple.    Cardiovascular: Normal rate, regular rhythm and intact distal pulses.   Pulmonary/Chest: Effort normal.  Abdominal: Bowel sounds are normal. She exhibits no mass.  Neurological: She is alert and oriented to person, place, and time. No cranial nerve deficit. Coordination normal.  Cerebellar exam is normal (finger to nose) Sensory exam normal for bilateral upper and lower extremities - and patient is able to discriminate between sharp and dull. Motor exam is 4+/5   Skin: Skin is warm and dry.  Nursing note and vitals reviewed.    ED Treatments / Results  Labs (all labs ordered are listed, but only abnormal results are displayed) Labs Reviewed  BASIC METABOLIC PANEL - Abnormal; Notable for the following:       Result Value   Potassium 3.2 (*)    Chloride 98 (*)    Glucose, Bld 155 (*)    Creatinine, Ser 1.17 (*)    GFR calc non Af Amer 43 (*)    GFR calc Af Amer 49 (*)    All other components within normal limits  CBC  TROPONIN I  CBG MONITORING, ED    EKG  EKG Interpretation None       Radiology Ct Angio Head W Or Wo Contrast  Result Date: 04/22/2017 CLINICAL DATA:  Headache, normal neuro exam EXAM: CT ANGIOGRAPHY HEAD AND NECK TECHNIQUE: Multidetector CT imaging of the head and neck was performed using the standard protocol during bolus administration of intravenous contrast. Multiplanar CT image reconstructions and MIPs were obtained to evaluate the vascular anatomy. Carotid stenosis measurements (when applicable) are obtained utilizing NASCET criteria, using the distal internal carotid diameter as the denominator. CONTRAST:  100 mL Isovue 370 IV COMPARISON:  None. FINDINGS: CT HEAD FINDINGS Brain: Ventricle size normal. Cerebral volume normal for age. Mild chronic appearing white matter hypodensity bilaterally. Negative for acute infarct, hemorrhage, or mass lesion. Vascular: Negative for hyperdense vessel Skull: Negative Sinuses: Negative Orbits: Bilateral cataract  removal.  No orbital mass Review of the MIP images confirms the above findings CTA NECK FINDINGS Aortic arch: Mild atherosclerotic calcification aortic arch. Proximal great vessels widely patent. Three vessel branching pattern. Right carotid system: Motion degraded study around the carotid bifurcation. Allowing for this, no significant right carotid stenosis. Mild atherosclerotic calcification at the right carotid bifurcation Left carotid system: Motion degraded study around the carotid bifurcation. Atherosclerotic calcification at the left carotid bifurcation. Focal stenosis estimated at 70% diameter stenosis left internal carotid artery. Vertebral arteries: Both vertebral arteries are patent without stenosis. Skeleton: No acute skeletal abnormality. Cervical spine spondylosis. Other neck: Negative Upper chest: Apical emphysema and subpleural blebs bilaterally. Apical pleural scarring bilaterally. Review of the MIP images confirms the above findings CTA HEAD FINDINGS Anterior circulation: Mild atherosclerotic disease in the cavernous carotid  bilaterally without significant stenosis. Anterior and middle cerebral arteries patent bilaterally without significant stenosis. Posterior circulation: Both vertebral artery is patent to the basilar. PICA patent bilaterally. Basilar widely patent. Superior cerebellar arteries patent bilaterally. Fetal origin of the posterior cerebral artery bilaterally without stenosis. Venous sinuses: Patent Anatomic variants: None Delayed phase: Normal enhancement on delayed imaging Review of the MIP images confirms the above findings IMPRESSION: Chronic microvascular ischemic change in the white matter. No acute intracranial abnormality. No significant right carotid stenosis. 70% diameter stenosis left internal carotid artery due to calcified plaque No significant intracranial stenosis Apical emphysema and subpleural blebs. Electronically Signed   By: Franchot Gallo M.D.   On: 04/22/2017  21:51   Dg Chest 2 View  Result Date: 04/22/2017 CLINICAL DATA:  Multiple near syncopal episodes today. EXAM: CHEST  2 VIEW COMPARISON:  08/30/2013 and 07/29/2013 FINDINGS: Patient slightly rotated to the left. Lungs are adequately inflated without focal airspace consolidation or effusion. Flattening of the hemidiaphragms on the lateral film. Cardiac silhouette is within normal. There is calcified plaque over the thoracic aorta. There are degenerative changes of the spine. IMPRESSION: No acute cardiopulmonary disease. Aortic Atherosclerosis (ICD10-I70.0). Electronically Signed   By: Marin Olp M.D.   On: 04/22/2017 20:14   Ct Angio Neck W And/or Wo Contrast  Result Date: 04/22/2017 CLINICAL DATA:  Headache, normal neuro exam EXAM: CT ANGIOGRAPHY HEAD AND NECK TECHNIQUE: Multidetector CT imaging of the head and neck was performed using the standard protocol during bolus administration of intravenous contrast. Multiplanar CT image reconstructions and MIPs were obtained to evaluate the vascular anatomy. Carotid stenosis measurements (when applicable) are obtained utilizing NASCET criteria, using the distal internal carotid diameter as the denominator. CONTRAST:  100 mL Isovue 370 IV COMPARISON:  None. FINDINGS: CT HEAD FINDINGS Brain: Ventricle size normal. Cerebral volume normal for age. Mild chronic appearing white matter hypodensity bilaterally. Negative for acute infarct, hemorrhage, or mass lesion. Vascular: Negative for hyperdense vessel Skull: Negative Sinuses: Negative Orbits: Bilateral cataract removal.  No orbital mass Review of the MIP images confirms the above findings CTA NECK FINDINGS Aortic arch: Mild atherosclerotic calcification aortic arch. Proximal great vessels widely patent. Three vessel branching pattern. Right carotid system: Motion degraded study around the carotid bifurcation. Allowing for this, no significant right carotid stenosis. Mild atherosclerotic calcification at the right  carotid bifurcation Left carotid system: Motion degraded study around the carotid bifurcation. Atherosclerotic calcification at the left carotid bifurcation. Focal stenosis estimated at 70% diameter stenosis left internal carotid artery. Vertebral arteries: Both vertebral arteries are patent without stenosis. Skeleton: No acute skeletal abnormality. Cervical spine spondylosis. Other neck: Negative Upper chest: Apical emphysema and subpleural blebs bilaterally. Apical pleural scarring bilaterally. Review of the MIP images confirms the above findings CTA HEAD FINDINGS Anterior circulation: Mild atherosclerotic disease in the cavernous carotid bilaterally without significant stenosis. Anterior and middle cerebral arteries patent bilaterally without significant stenosis. Posterior circulation: Both vertebral artery is patent to the basilar. PICA patent bilaterally. Basilar widely patent. Superior cerebellar arteries patent bilaterally. Fetal origin of the posterior cerebral artery bilaterally without stenosis. Venous sinuses: Patent Anatomic variants: None Delayed phase: Normal enhancement on delayed imaging Review of the MIP images confirms the above findings IMPRESSION: Chronic microvascular ischemic change in the white matter. No acute intracranial abnormality. No significant right carotid stenosis. 70% diameter stenosis left internal carotid artery due to calcified plaque No significant intracranial stenosis Apical emphysema and subpleural blebs. Electronically Signed   By: Franchot Gallo M.D.  On: 04/22/2017 21:51    Procedures Procedures (including critical care time)  Medications Ordered in ED Medications  iopamidol (ISOVUE-370) 76 % injection 100 mL (80 mLs Intravenous Contrast Given 04/22/17 2105)     Initial Impression / Assessment and Plan / ED Course  I have reviewed the triage vital signs and the nursing notes.  Pertinent labs & imaging results that were available during my care of the  patient were reviewed by me and considered in my medical decision making (see chart for details).  Clinical Course as of Apr 22 2330  Sat Apr 22, 2017  2331 Results from the ER workup discussed with the patient face to face and all questions answered to the best of my ability.  Pt made aware of the carotic stenosis, which is not causing any problems at the moment. I have advised her to see vascular surgery for close eval. Strict ER return precautions have been discussed, and patient is agreeing with the plan and is comfortable with the workup done and the recommendations from the ER.  CT Angio Head W or Wo Contrast [AN]    Clinical Course User Index [AN] Varney Biles, MD   DDx includes: Orthostatic hypotension Vertebral artery dissection/stenosis TIA Dysrhythmia PE Vasovagal/neurocardiogenic syncope Aortic stenosis Valvular disorder/Cardiomyopathy Anemia  Pt comes in with cc of dizziness and near syncope. She has had repeated episodes of near syncope, usually when she is walking, w/o prodrome. PT has not had syncope and she denies vertigo.  She has hx of smoking for extended period of time, quit 3 years ago and has CAD hx. Pt also has associated neck/posterior headache.  TIA is highest on the ddx. ABCD2 SCORE IS 3 We will get CT angio head and neck to eval for any stenosis.  Labs ordered to ensure there is no anemia, EKG also ordered, however, cardiac etiology is deemed to be less likely. Also, pt has no signs of AAA.   Final Clinical Impressions(s) / ED Diagnoses   Final diagnoses:  Near syncope  Dizziness  Carotid stenosis, left    New Prescriptions New Prescriptions   No medications on file     Varney Biles, MD 04/22/17 2131    Varney Biles, MD 04/22/17 [phone]

## 2017-04-22 NOTE — ED Notes (Signed)
Alert, NAD, calm, interactive, resps e/u, speaking in clear complete sentences, no dyspnea noted, skin W&D, VSS, states, "currently feel normal", denies sx, (denies: pain, sob, nausea, dizziness or visual changes). Family at [facility]. Pt to CTA.

## 2017-04-22 NOTE — ED Triage Notes (Addendum)
PResents with intermittent episodes of near syncope and chest tightness that began 4 days ago. She states she is not having chest pain, but her chest feels heavy. She endorses nausea today and fatigue. Alert and oriented. Dnies SOB. PT is also c/o occipital headache off and on for the past few days

## 2017-04-22 NOTE — Discharge Instructions (Addendum)
We saw you in the ER for the dizziness and near fainting. All the results in the ER are normal, labs and imaging. We are not sure what is causing your symptoms. The workup in the ER is not complete, and is limited to screening for life threatening and emergent conditions only, so please see a primary care doctor for further evaluation.  Return to the ER immediately if the dizziness is constant, or you develop any new neurologic symptoms such as numbness, tingling. Be careful with walking to prevent any falls.

## 2017-04-27 DIAGNOSIS — I951 Orthostatic hypotension: Secondary | ICD-10-CM | POA: Diagnosis not present

## 2017-04-27 DIAGNOSIS — I10 Essential (primary) hypertension: Secondary | ICD-10-CM | POA: Diagnosis not present

## 2017-04-27 DIAGNOSIS — D692 Other nonthrombocytopenic purpura: Secondary | ICD-10-CM | POA: Diagnosis not present

## 2017-04-27 DIAGNOSIS — Z23 Encounter for immunization: Secondary | ICD-10-CM | POA: Diagnosis not present

## 2017-04-27 DIAGNOSIS — J449 Chronic obstructive pulmonary disease, unspecified: Secondary | ICD-10-CM | POA: Diagnosis not present

## 2017-05-29 DIAGNOSIS — Z79899 Other long term (current) drug therapy: Secondary | ICD-10-CM | POA: Diagnosis not present

## 2017-05-29 DIAGNOSIS — I10 Essential (primary) hypertension: Secondary | ICD-10-CM | POA: Diagnosis not present

## 2017-05-29 DIAGNOSIS — J449 Chronic obstructive pulmonary disease, unspecified: Secondary | ICD-10-CM | POA: Diagnosis not present

## 2017-05-29 DIAGNOSIS — E7800 Pure hypercholesterolemia, unspecified: Secondary | ICD-10-CM | POA: Diagnosis not present

## 2017-05-29 DIAGNOSIS — R252 Cramp and spasm: Secondary | ICD-10-CM | POA: Diagnosis not present

## 2017-09-27 DIAGNOSIS — R252 Cramp and spasm: Secondary | ICD-10-CM | POA: Diagnosis not present

## 2017-09-27 DIAGNOSIS — I10 Essential (primary) hypertension: Secondary | ICD-10-CM | POA: Diagnosis not present

## 2017-09-27 DIAGNOSIS — J449 Chronic obstructive pulmonary disease, unspecified: Secondary | ICD-10-CM | POA: Diagnosis not present

## 2017-09-27 DIAGNOSIS — Z79899 Other long term (current) drug therapy: Secondary | ICD-10-CM | POA: Diagnosis not present

## 2017-09-27 DIAGNOSIS — F5101 Primary insomnia: Secondary | ICD-10-CM | POA: Diagnosis not present

## 2017-10-02 DIAGNOSIS — L820 Inflamed seborrheic keratosis: Secondary | ICD-10-CM | POA: Diagnosis not present

## 2017-10-02 DIAGNOSIS — D2239 Melanocytic nevi of other parts of face: Secondary | ICD-10-CM | POA: Diagnosis not present

## 2017-10-02 DIAGNOSIS — D225 Melanocytic nevi of trunk: Secondary | ICD-10-CM | POA: Diagnosis not present

## 2017-10-02 DIAGNOSIS — L570 Actinic keratosis: Secondary | ICD-10-CM | POA: Diagnosis not present

## 2017-10-02 DIAGNOSIS — D224 Melanocytic nevi of scalp and neck: Secondary | ICD-10-CM | POA: Diagnosis not present

## 2017-10-02 DIAGNOSIS — L821 Other seborrheic keratosis: Secondary | ICD-10-CM | POA: Diagnosis not present

## 2017-10-11 DIAGNOSIS — R29898 Other symptoms and signs involving the musculoskeletal system: Secondary | ICD-10-CM | POA: Diagnosis not present

## 2017-10-11 DIAGNOSIS — Z79899 Other long term (current) drug therapy: Secondary | ICD-10-CM | POA: Diagnosis not present

## 2017-10-11 DIAGNOSIS — M6281 Muscle weakness (generalized): Secondary | ICD-10-CM | POA: Diagnosis not present

## 2017-10-11 DIAGNOSIS — I10 Essential (primary) hypertension: Secondary | ICD-10-CM | POA: Diagnosis not present

## 2017-10-25 ENCOUNTER — Other Ambulatory Visit: Payer: Self-pay | Admitting: Geriatric Medicine

## 2017-10-25 DIAGNOSIS — M545 Low back pain, unspecified: Secondary | ICD-10-CM

## 2017-10-25 DIAGNOSIS — G373 Acute transverse myelitis in demyelinating disease of central nervous system: Secondary | ICD-10-CM | POA: Diagnosis not present

## 2017-10-25 DIAGNOSIS — I10 Essential (primary) hypertension: Secondary | ICD-10-CM | POA: Diagnosis not present

## 2017-10-25 DIAGNOSIS — R29898 Other symptoms and signs involving the musculoskeletal system: Secondary | ICD-10-CM | POA: Diagnosis not present

## 2017-10-29 ENCOUNTER — Ambulatory Visit
Admission: RE | Admit: 2017-10-29 | Discharge: 2017-10-29 | Disposition: A | Discharge status: Home / Self Care | Payer: Medicare Other | Source: Ambulatory Visit | Attending: Geriatric Medicine | Admitting: Geriatric Medicine

## 2017-10-29 DIAGNOSIS — M4802 Spinal stenosis, cervical region: Secondary | ICD-10-CM | POA: Diagnosis not present

## 2017-10-29 DIAGNOSIS — R29898 Other symptoms and signs involving the musculoskeletal system: Secondary | ICD-10-CM

## 2017-10-29 DIAGNOSIS — M48061 Spinal stenosis, lumbar region without neurogenic claudication: Secondary | ICD-10-CM | POA: Diagnosis not present

## 2017-10-29 DIAGNOSIS — M545 Low back pain, unspecified: Secondary | ICD-10-CM

## 2017-10-29 MED ORDER — GADOBENATE DIMEGLUMINE 529 MG/ML IV SOLN
15.0000 mL | Freq: Once | INTRAVENOUS | Status: AC | PRN
  Administered 2017-10-29: 15 mL via INTRAVENOUS

## 2017-12-26 DIAGNOSIS — I10 Essential (primary) hypertension: Secondary | ICD-10-CM | POA: Diagnosis not present

## 2017-12-26 DIAGNOSIS — E7800 Pure hypercholesterolemia, unspecified: Secondary | ICD-10-CM | POA: Diagnosis not present

## 2017-12-26 DIAGNOSIS — Z1389 Encounter for screening for other disorder: Secondary | ICD-10-CM | POA: Diagnosis not present

## 2017-12-26 DIAGNOSIS — Z0000 Encounter for general adult medical examination without abnormal findings: Secondary | ICD-10-CM | POA: Diagnosis not present

## 2017-12-26 DIAGNOSIS — Z23 Encounter for immunization: Secondary | ICD-10-CM | POA: Diagnosis not present

## 2017-12-26 DIAGNOSIS — J449 Chronic obstructive pulmonary disease, unspecified: Secondary | ICD-10-CM | POA: Diagnosis not present

## 2017-12-26 DIAGNOSIS — R252 Cramp and spasm: Secondary | ICD-10-CM | POA: Diagnosis not present

## 2018-01-30 DIAGNOSIS — L820 Inflamed seborrheic keratosis: Secondary | ICD-10-CM | POA: Diagnosis not present

## 2018-01-30 DIAGNOSIS — L570 Actinic keratosis: Secondary | ICD-10-CM | POA: Diagnosis not present

## 2018-01-30 DIAGNOSIS — L821 Other seborrheic keratosis: Secondary | ICD-10-CM | POA: Diagnosis not present

## 2018-01-30 DIAGNOSIS — D485 Neoplasm of uncertain behavior of skin: Secondary | ICD-10-CM | POA: Diagnosis not present

## 2018-02-23 DIAGNOSIS — I10 Essential (primary) hypertension: Secondary | ICD-10-CM | POA: Diagnosis not present

## 2018-02-23 DIAGNOSIS — R269 Unspecified abnormalities of gait and mobility: Secondary | ICD-10-CM | POA: Diagnosis not present

## 2018-02-23 DIAGNOSIS — R202 Paresthesia of skin: Secondary | ICD-10-CM | POA: Diagnosis not present

## 2018-03-22 ENCOUNTER — Ambulatory Visit: Payer: Medicare Other | Attending: Geriatric Medicine | Admitting: Physical Therapy

## 2018-03-22 DIAGNOSIS — R2681 Unsteadiness on feet: Secondary | ICD-10-CM | POA: Diagnosis not present

## 2018-03-22 DIAGNOSIS — M6281 Muscle weakness (generalized): Secondary | ICD-10-CM | POA: Diagnosis not present

## 2018-03-22 DIAGNOSIS — R2689 Other abnormalities of gait and mobility: Secondary | ICD-10-CM | POA: Diagnosis not present

## 2018-03-23 ENCOUNTER — Other Ambulatory Visit: Payer: Self-pay

## 2018-03-23 ENCOUNTER — Encounter: Payer: Self-pay | Admitting: Physical Therapy

## 2018-03-23 NOTE — Therapy (Signed)
[facility] [address] [place], [place], [address] Phone: [phone]   Fax:  [phone]  Physical Therapy Evaluation  Patient Details  Name: Kim Sutton MRN: [id number] Date of Birth: 30-Aug-1933 Referring Provider: Dr. Lajean Manes   Encounter Date: 03/22/2018  PT End of Session - 03/23/18 1843    Visit Number  1    Number of Visits  17    Date for PT Re-Evaluation  05/22/18    Authorization Type  Medicare and AARP    Authorization Time Period  03-22-18 - 06-20-18    PT Start Time  1448    PT Stop Time  1540    PT Time Calculation (min)  52 min       Past Medical History:  Diagnosis Date  . Adrenal cortical adenoma of left adrenal gland    stable since 2009;  MRI in 04/2010 - no further scans  . CAD (coronary artery disease)    a. s/p NSTEMI in 2001 tx with BMS to RCA  . Colon polyps   . HLD (hyperlipidemia)   . Hx of cardiovascular stress test    Lexiscan Myoview (09/2013):  No ischemia, EF 77%, Low Risk.  Marland Kitchen Hyperglycemia   . Hypertension   . Myoclonus    on neurontin per neuro at [facility]  . Nephrolithiasis   . Nodule of right lung    a. 44mm on CXR 10/2009;  CT 05/2010 ok - no further scans  . Tobacco abuse     Past Surgical History:  Procedure Laterality Date  . CHOLECYSTECTOMY    . CORONARY ANGIOPLASTY WITH STENT PLACEMENT    . TOTAL ABDOMINAL HYSTERECTOMY      There were no vitals filed for this visit.   Subjective Assessment - 03/23/18 1806    Subjective  Pt is accompanied to PT by her husband; they arrive late for eval, stating they got lost trying to find location:  pt is ambulating without device, holding onto husband's arm for assistance; presents with Rt hemiparesis with Rt foot drop     Patient is accompained by:  --   husband -[name]   Pertinent History  h/o transverse myelitis;  (husband reports Rt sided weakness is due to pt having had lesion on spine due to shingles approx. 3 yrs  ago):  CAD; HTN    Patient Stated Goals  "get up and walk some place"    Currently in Pain?  Yes    Pain Score  8     Pain Location  --   entire Rt side per pt report   Pain Orientation  Right    Pain Descriptors / Indicators  Aching;Constant    Pain Type  Chronic pain    Pain Onset  More than a month ago    Pain Frequency  Constant    Aggravating Factors   no specific     Pain Relieving Factors  rest         [facility] PT Assessment - 03/23/18 0001      Assessment   Medical Diagnosis  Gait Abnormality    Referring Provider  Dr. Lajean Manes    Hand Dominance  Right      Precautions   Precautions  Fall      Balance Screen   Has the patient fallen in the past 6 months  Yes    How many times?  2    Has the patient had a decrease in activity  level because of a fear of falling?   Yes    Is the patient reluctant to leave their home because of a fear of falling?   No      Home Environment   Living Environment  Private residence    Type of [place] to enter    Entrance Stairs-Number of Steps  3    Entrance Stairs-Rails  Can reach both    [place] to live on main level with bedroom/bathroom    [place] - 2 wheels   obtained approx. 2 1/2 yrs ago     Prior Function   Level of Independence  Independent with household mobility without device;Independent with community mobility with device;Independent with basic ADLs   pt states she drives     ROM / Strength   AROM / PROM / Strength  Strength      Strength   Overall Strength Comments  LLE is WNL's    Strength Assessment Site  Hip;Knee;Ankle    Right/Left Hip  Right    Right Hip Flexion  3+/5    Right Hip ABduction  3-/5    Right/Left Knee  Right    Right Knee Flexion  3+/5   in seated position   Right Knee Extension  4/5    Right/Left Ankle  Right    Right Ankle Dorsiflexion  3-/5    Right Ankle Plantar Flexion  3-/5      Transfers   Transfers  Sit to Stand    Comments   UE support needed to stand from mat      Ambulation/Gait   Ambulation/Gait  Yes    Ambulation/Gait Assistance  4: Min guard   pt holding husband's arm to amb. from lobby to gym   Ambulation Distance (Feet)  75 Feet    Assistive device  None    Gait Pattern  Decreased arm swing - right;Decreased step length - right;Decreased hip/knee flexion - right;Decreased dorsiflexion - right;Decreased weight shift to right;Right genu recurvatum;Lateral trunk lean to right;Poor foot clearance - right    Ambulation Surface  Level;Indoor    Gait velocity  21.47 secs = 1.53 ft/sec with no device      Standardized Balance Assessment   Standardized Balance Assessment  Berg Balance Test;Timed Up and Go Test      Berg Balance Test   Sit to Stand  Needs minimal aid to stand or to stabilize    Standing Unsupported  Able to stand 30 seconds unsupported    Sitting with Back Unsupported but Feet Supported on Floor or Stool  Able to sit safely and securely 2 minutes    Stand to Sit  Controls descent by using hands    Transfers  Able to transfer safely, definite need of hands    Standing Unsupported with Eyes Closed  Able to stand 3 seconds    Standing Ubsupported with Feet Together  Able to place feet together independently but unable to hold for 30 seconds    From Standing, Reach Forward with Outstretched Arm  Can reach forward >12 cm safely (5")    From Standing Position, Pick up Object from Floor  Unable to try/needs assist to keep balance    From Standing Position, Turn to Look Behind Over each Shoulder  Turn sideways only but maintains balance    Turn 360 Degrees  Needs close supervision or verbal cueing    Standing Unsupported,  Alternately Place Feet on Step/Stool  Needs assistance to keep from falling or unable to try    Standing Unsupported, One Foot in Front  Able to plae foot ahead of the other independently and hold 30 seconds    Standing on One Leg  Unable to try or needs assist to prevent fall     Total Score  26      Timed Up and Go Test   Normal TUG (seconds)  18.09   no device                Objective measurements completed on examination: See above findings.              PT Education - 03/23/18 1841    Education Details  recommended use of RW to pt and husband as pt is at high risk for fall per Berg score 26/56; also recommend consult for Rt AFO and recommend OT consult for RUE deficits    Person(s) Educated  Patient;Spouse    Methods  Explanation    Comprehension  Verbalized understanding       PT Short Term Goals - 03/23/18 1852      PT SHORT TERM GOAL #1   Title  Improve Berg score from 26/56 to >/= 31/56 to reduce fall risk.    Time  4    Period  Weeks    Status  New    Target Date  04/22/18      PT SHORT TERM GOAL #2   Title  Improve TUG score from 18.09 secs without device to </= 15 secs without device for improved functional mobility and reduced fall risk.    Baseline  18.09 secs    Time  4    Period  Weeks    Status  New    Target Date  04/22/18      PT SHORT TERM GOAL #3   Title  Pt will amb. 250' with RW with CGA on flat, even surface for incr. community accessibility.      Time  4    Period  Weeks    Status  New    Target Date  04/22/18      PT SHORT TERM GOAL #4   Title  Increase gait velocity from 1.53 ft/sec to >/= 1.9 ft/sec without device for incr. gait efficiency.    Baseline  21.47 secs = 1.53 ft/sec    Time  4    Period  Weeks    Status  New    Target Date  04/22/18      PT SHORT TERM GOAL #5   Title  Independent in HEP for RLE strengthening and balance exercises.    Time  4    Period  Weeks    Status  New    Target Date  04/22/18        PT Long Term Goals - 03/23/18 1857      PT LONG TERM GOAL #1   Title  Pt will increase Berg score from 26/56 to >/= 36/56 to reduce fall risk.    Baseline  26/56 on 03-22-18    Time  8    Period  Weeks    Status  New    Target Date  05/22/18      PT LONG TERM GOAL  #2   Title  AFO consult for RLE will be obtained - and AFO for RLE obtained if determined to be needed.     Time  8    Period  Weeks    Status  New    Target Date  05/22/18      PT LONG TERM GOAL #3   Title  Pt will amb. 500' with RW with supervision on flat, even surface for incr. community accessibility.    Time  8    Period  Weeks    Status  New    Target Date  05/22/18      PT LONG TERM GOAL #4   Title  Improve TUG score from 18.09 secs to </= 13.5 secs without device to reduce fall risk.    Baseline  18.09 secs on 03-22-18    Time  8    Period  Weeks    Status  New    Target Date  05/22/18      PT LONG TERM GOAL #5   Title  Increase gait velocity from 1.53 ft/sec to >/= 2.2 ft/sec without device for incr. gait efficiency.     Baseline  21.47 secs = 1.53 ft/sec with no device on 03-22-18    Time  8    Period  Weeks    Status  New    Target Date  05/22/18             Plan - 03/23/18 1844    Clinical Impression Statement  Pt is an 84 year old lady with Rt hemiparesis due to h/o transverse myelitis who presents with gait and balance deficits and decreased strength and functional use of RLE.  Pt is at risk for fall per Berg score 26/56; pt exhibits Rt genu recurvatum which increased with fatigue.  Pt also presents with decreased strength and functional use of RUE with decreased fine motor coordination (would benefit from OT referral).      History and Personal Factors relevant to plan of care:  h/o transverse myelitis; CAD; pt states she has RW and a cane - only uses RW if she is going to amb. prolonged community distances; pt currently drives - states she uses the shopping carts as RW    Clinical Presentation  Stable    Clinical Presentation due to:  h/o transverse myelitis with Rt hemiparesis    Clinical Decision Making  Low    Rehab Potential  Good    Clinical Impairments Affecting Rehab Potential  chronicity of deficits - approx. 3 yrs since onset (due to transverse  myelitis?)    PT Frequency  2x / week    PT Duration  8 weeks    PT Treatment/Interventions  ADLs/Self Care Home Management;DME Instruction;Gait training;Stair training;Orthotic Fit/Training;Patient/family education;Neuromuscular re-education;Balance training;Therapeutic exercise;Therapeutic activities;Passive range of motion    PT Next Visit Plan  begin HEP for RLE strengthening and balance exercises; trial AFO RLE to assess benefit for gait safety    PT Home Exercise Plan  see above    Recommended Other Services  OT referral to be obtained (pt agrees); AFO consult     Consulted and Agree with Plan of Care  Patient;Family member/caregiver    Family Member Consulted  spouse - [name]       Patient will benefit from skilled therapeutic intervention in order to improve the following deficits and impairments:  Abnormal gait, Decreased activity tolerance, Decreased balance, Decreased coordination, Decreased strength, Impaired flexibility, Impaired tone, Impaired UE functional use, Pain  Visit Diagnosis: Other abnormalities of gait and mobility - Plan: PT plan of care cert/re-cert  Muscle weakness (generalized) - Plan: PT plan of care cert/re-cert  Unsteadiness on feet - Plan: PT plan of care cert/re-cert     Problem List Patient Active Problem List   Diagnosis Date Noted  . CAD (coronary artery disease) 09/04/2013  . HTN (hypertension) 09/04/2013  . HLD (hyperlipidemia) 09/04/2013  . Tobacco abuse 09/04/2013    Alda Lea, PT 03/23/2018, 7:12 PM  [facility] [address] [place], [place], [address] Phone: [phone]   Fax:  [phone]  Name: Kim Sutton MRN: [id number] Date of Birth: 02/18/1934

## 2018-04-04 ENCOUNTER — Ambulatory Visit: Payer: Medicare Other | Attending: Geriatric Medicine | Admitting: Physical Therapy

## 2018-04-04 ENCOUNTER — Encounter: Payer: Self-pay | Admitting: Physical Therapy

## 2018-04-04 DIAGNOSIS — R2681 Unsteadiness on feet: Secondary | ICD-10-CM | POA: Insufficient documentation

## 2018-04-04 DIAGNOSIS — R2689 Other abnormalities of gait and mobility: Secondary | ICD-10-CM | POA: Diagnosis not present

## 2018-04-04 DIAGNOSIS — R278 Other lack of coordination: Secondary | ICD-10-CM | POA: Diagnosis not present

## 2018-04-04 DIAGNOSIS — M6281 Muscle weakness (generalized): Secondary | ICD-10-CM

## 2018-04-04 NOTE — Patient Instructions (Signed)
Access Code: [email or web address]  URL: [email or web address]  Date: 04/04/2018  Prepared by: Willow Ora   Exercises  Bridge - 10 reps - 1 sets - 1x daily - 5x weekly  Supine Short Arc Quad - 10 reps - 1 sets - 1x daily - 5x weekly  Hooklying Single Leg Bent Knee Fallouts with Resistance - 10 reps - 1 sets - 1x daily - 5x weekly  Sit to Stand with Counter Support - 5-10 reps - 1 sets - 1x daily - 5x weekly

## 2018-04-04 NOTE — Therapy (Signed)
[facility] [address] [place], [place], [address] Phone: [phone]   Fax:  [phone]  Physical Therapy Treatment  Patient Details  Name: Kim Sutton MRN: [id number] Date of Birth: Feb 22, 1934 Referring Provider: Dr. Lajean Manes   Encounter Date: 04/04/2018  PT End of Session - 04/04/18 1321    Visit Number  2    Number of Visits  17    Date for PT Re-Evaluation  05/22/18    Authorization Type  Medicare and AARP    Authorization Time Period  03-22-18 - 06-20-18    PT Start Time  3785    PT Stop Time  1400    PT Time Calculation (min)  43 min    Equipment Utilized During Treatment  Gait belt    Activity Tolerance  Patient tolerated treatment well;No increased pain;Patient limited by fatigue       Past Medical History:  Diagnosis Date  . Adrenal cortical adenoma of left adrenal gland    stable since 2009;  MRI in 04/2010 - no further scans  . CAD (coronary artery disease)    a. s/p NSTEMI in 2001 tx with BMS to RCA  . Colon polyps   . HLD (hyperlipidemia)   . Hx of cardiovascular stress test    Lexiscan Myoview (09/2013):  No ischemia, EF 77%, Low Risk.  Marland Kitchen Hyperglycemia   . Hypertension   . Myoclonus    on neurontin per neuro at [facility]  . Nephrolithiasis   . Nodule of right lung    a. 98mm on CXR 10/2009;  CT 05/2010 ok - no further scans  . Tobacco abuse     Past Surgical History:  Procedure Laterality Date  . CHOLECYSTECTOMY    . CORONARY ANGIOPLASTY WITH STENT PLACEMENT    . TOTAL ABDOMINAL HYSTERECTOMY      There were no vitals filed for this visit.  Subjective Assessment - 04/04/18 1319    Subjective  No new complaints. No new falls. To clinic today with spouse, no RW. Spouse reports it's in the car.     Patient is accompained by:  Family member   spouse- [name]   Pertinent History  h/o transverse myelitis;  (husband reports Rt sided weakness is due to pt having had lesion on spine due to  shingles approx. 3 yrs ago):  CAD; HTN    Patient Stated Goals  "get up and walk some place"    Currently in Pain?  Yes    Pain Score  8     Pain Location  Generalized   entire right side of her body   Pain Orientation  Right    Pain Descriptors / Indicators  Aching;Constant    Pain Type  Chronic pain    Pain Onset  More than a month ago    Pain Frequency  Constant    Aggravating Factors   over-exertion, "when I get tired"    Pain Relieving Factors  rest          [facility] Adult PT Treatment/Exercise - 04/04/18 1350      Ambulation/Gait   Ambulation/Gait  Yes    Ambulation/Gait Assistance  4: Min guard;4: Min assist;5: Supervision    Ambulation/Gait Assistance Details  1st lap with min HHA from lobby. during exercises pt's spouse went to get RW from car. used RW with remainder of gait with min guard to supervision assistance. cues to stay tall and close to RW with gait.  Ambulation Distance (Feet)  70 Feet   x 3   Assistive device  None;Rolling walker    Gait Pattern  Decreased arm swing - right;Decreased step length - right;Decreased hip/knee flexion - right;Decreased dorsiflexion - right;Decreased weight shift to right;Right genu recurvatum;Lateral trunk lean to right;Poor foot clearance - right    Ambulation Surface  Level;Indoor      Knee/Hip Exercises: Aerobic   Other Aerobic  Scifit LE/UE level 2.0 for 8 minutes with goal >/= 30 for strengthening and activity tolerance.       issued the following to HEP today. Cues needed for correct exercise form/technique.   Access Code: [email or web address]  URL: [email or web address]  Date: 04/04/2018  Prepared by: Willow Ora   Exercises  Bridge - 10 reps - 1 sets - 1x daily - 5x weekly  Supine Short Arc Quad - 10 reps - 1 sets - 1x daily - 5x weekly  Hooklying Single Leg Bent Knee Fallouts with Resistance - 10 reps - 1 sets - 1x daily - 5x weekly  Sit to Stand with Counter Support - 5-10 reps - 1 sets - 1x daily - 5x weekly        PT Education - 04/04/18 1404    Education Details  HEP issued for strengthening today; reinforced PT recommendation for use of RW at all times.     Person(s) Educated  Patient;Spouse    Methods  Explanation;Demonstration;Verbal cues;Handout;Tactile cues    Comprehension  Verbalized understanding;Returned demonstration;Verbal cues required;Tactile cues required;Need further instruction       PT Short Term Goals - 03/23/18 1852      PT SHORT TERM GOAL #1   Title  Improve Berg score from 26/56 to >/= 31/56 to reduce fall risk.    Time  4    Period  Weeks    Status  New    Target Date  04/22/18      PT SHORT TERM GOAL #2   Title  Improve TUG score from 18.09 secs without device to </= 15 secs without device for improved functional mobility and reduced fall risk.    Baseline  18.09 secs    Time  4    Period  Weeks    Status  New    Target Date  04/22/18      PT SHORT TERM GOAL #3   Title  Pt will amb. 250' with RW with CGA on flat, even surface for incr. community accessibility.      Time  4    Period  Weeks    Status  New    Target Date  04/22/18      PT SHORT TERM GOAL #4   Title  Increase gait velocity from 1.53 ft/sec to >/= 1.9 ft/sec without device for incr. gait efficiency.    Baseline  21.47 secs = 1.53 ft/sec    Time  4    Period  Weeks    Status  New    Target Date  04/22/18      PT SHORT TERM GOAL #5   Title  Independent in HEP for RLE strengthening and balance exercises.    Time  4    Period  Weeks    Status  New    Target Date  04/22/18        PT Long Term Goals - 03/23/18 1857      PT LONG TERM GOAL #1   Title  Pt will increase Berg score from 26/56 to >/= 36/56  to reduce fall risk.    Baseline  26/56 on 03-22-18    Time  8    Period  Weeks    Status  New    Target Date  05/22/18      PT LONG TERM GOAL #2   Title  AFO consult for RLE will be obtained - and AFO for RLE obtained if determined to be needed.     Time  8    Period  Weeks     Status  New    Target Date  05/22/18      PT LONG TERM GOAL #3   Title  Pt will amb. 500' with RW with supervision on flat, even surface for incr. community accessibility.    Time  8    Period  Weeks    Status  New    Target Date  05/22/18      PT LONG TERM GOAL #4   Title  Improve TUG score from 18.09 secs to </= 13.5 secs without device to reduce fall risk.    Baseline  18.09 secs on 03-22-18    Time  8    Period  Weeks    Status  New    Target Date  05/22/18      PT LONG TERM GOAL #5   Title  Increase gait velocity from 1.53 ft/sec to >/= 2.2 ft/sec without device for incr. gait efficiency.     Baseline  21.47 secs = 1.53 ft/sec with no device on 03-22-18    Time  8    Period  Weeks    Status  New    Target Date  05/22/18            Plan - 04/04/18 1321    Clinical Impression Statement  Today's skilled session focused on strengthening and gait with RW. Pt needed rest breaks due to fatigue with exercises. Issued HEP today after performance in session. Did not added straight leg raises due to amount of assistance needed to lift leg up/down slowly and controlled. Will continue to work on that in session. Pt is progressing toward goals and should benefit from continued PT to progress toward unmet goals.     Rehab Potential  Good    Clinical Impairments Affecting Rehab Potential  chronicity of deficits - approx. 3 yrs since onset (due to transverse myelitis?)    PT Frequency  2x / week    PT Duration  8 weeks    PT Treatment/Interventions  ADLs/Self Care Home Management;DME Instruction;Gait training;Stair training;Orthotic Fit/Training;Patient/family education;Neuromuscular re-education;Balance training;Therapeutic exercise;Therapeutic activities;Passive range of motion    PT Next Visit Plan  added balance components to HEP- feet together, modified tandem, vision removed, ? single leg stance with support; gait with trial of AFO's if pt wears appropriate shoes; continue to  address LE strengthening.     PT Home Exercise Plan  see above    Consulted and Agree with Plan of Care  Patient;Family member/caregiver    Family Member Consulted  spouse - [name]       Patient will benefit from skilled therapeutic intervention in order to improve the following deficits and impairments:  Abnormal gait, Decreased activity tolerance, Decreased balance, Decreased coordination, Decreased strength, Impaired flexibility, Impaired tone, Impaired UE functional use, Pain  Visit Diagnosis: Other abnormalities of gait and mobility  Muscle weakness (generalized)     Problem List Patient Active Problem List   Diagnosis Date Noted  . CAD (coronary artery disease) 09/04/2013  .  HTN (hypertension) 09/04/2013  . HLD (hyperlipidemia) 09/04/2013  . Tobacco abuse 09/04/2013    Willow Ora, PTA, [facility] [address], [place], [place] [address] [phone] 04/04/18, 2:10 PM   Name: Kim Sutton MRN: [id number] Date of Birth: July 15, 1934

## 2018-04-05 ENCOUNTER — Encounter: Payer: Self-pay | Admitting: Physical Therapy

## 2018-04-05 ENCOUNTER — Ambulatory Visit: Payer: Medicare Other | Admitting: Physical Therapy

## 2018-04-05 DIAGNOSIS — R2689 Other abnormalities of gait and mobility: Secondary | ICD-10-CM | POA: Diagnosis not present

## 2018-04-05 DIAGNOSIS — M6281 Muscle weakness (generalized): Secondary | ICD-10-CM | POA: Diagnosis not present

## 2018-04-05 DIAGNOSIS — R2681 Unsteadiness on feet: Secondary | ICD-10-CM | POA: Diagnosis not present

## 2018-04-05 DIAGNOSIS — R278 Other lack of coordination: Secondary | ICD-10-CM | POA: Diagnosis not present

## 2018-04-07 ENCOUNTER — Telehealth: Payer: Self-pay | Admitting: Physical Therapy

## 2018-04-07 NOTE — Telephone Encounter (Signed)
Dr. Felipa Eth,  Kim Sutton is being treated by physical therapy for falls with weakness and gait difficulty.  She will benefit from use of an right AFO in order to improve safety with functional mobility.    If you agree, please submit request in EPIC under referral for DME (list right AFO in comments) or fax to [facility] at [phone].   She also demonstrated right UE weakness on evaluation with PT and it was recommended by the evaluating PT she have an OT consult to address this issue. If you agree with an OT consult for right UE weakness please submit an OT order in Epic under referrals or fax to [phone].  Thank you,  Willow Ora, PTA, [facility] [address], [place], [place] [address] [phone] 04/07/18, 6:04 PM   [facility] [address] [place], [place]  [address] Phone:  [phone] Fax:  [phone]

## 2018-04-07 NOTE — Therapy (Signed)
[facility] [address] [place], [place], [address] Phone: [phone]   Fax:  [phone]  Physical Therapy Treatment  Patient Details  Name: Kim Sutton MRN: [id number] Date of Birth: 03-28-1934 Referring Provider: Dr. Lajean Manes   Encounter Date: 04/05/2018     04/05/18 0937  PT Visits / Re-Eval  Visit Number 3  Number of Visits 17  Date for PT Re-Evaluation 05/22/18  Authorization  Authorization Type Medicare and AARP  Authorization Time Period 03-22-18 - 06-20-18  PT Time Calculation  PT Start Time 0932  PT Stop Time 1015  PT Time Calculation (min) 43 min  PT - End of Session  Equipment Utilized During Treatment Gait belt  Activity Tolerance Patient tolerated treatment well;No increased pain;Patient limited by fatigue  Behavior During Therapy [facility] for tasks assessed/performed    Past Medical History:  Diagnosis Date  . Adrenal cortical adenoma of left adrenal gland    stable since 2009;  MRI in 04/2010 - no further scans  . CAD (coronary artery disease)    a. s/p NSTEMI in 2001 tx with BMS to RCA  . Colon polyps   . HLD (hyperlipidemia)   . Hx of cardiovascular stress test    Lexiscan Myoview (09/2013):  No ischemia, EF 77%, Low Risk.  Marland Kitchen Hyperglycemia   . Hypertension   . Myoclonus    on neurontin per neuro at [facility]  . Nephrolithiasis   . Nodule of right lung    a. 70mm on CXR 10/2009;  CT 05/2010 ok - no further scans  . Tobacco abuse     Past Surgical History:  Procedure Laterality Date  . CHOLECYSTECTOMY    . CORONARY ANGIOPLASTY WITH STENT PLACEMENT    . TOTAL ABDOMINAL HYSTERECTOMY      There were no vitals filed for this visit.     04/05/18 0935  Symptoms/Limitations  Subjective No new falls. To clinic today with walker and sneakers on. Also with a wrist brace on left side due to left UE pain.   Patient is accompained by: Family member (spouse- [name])  Pertinent History h/o  transverse myelitis;  (husband reports Rt sided weakness is due to pt having had lesion on spine due to shingles approx. 3 yrs ago):  CAD; HTN  Pain Assessment  Currently in Pain? Yes  Pain Score 6  Pain Location Generalized (mostly right side, some left UE pain today)  Pain Orientation Right;Left  Pain Descriptors / Indicators Aching;Constant  Pain Type Chronic pain  Pain Onset More than a month ago  Pain Frequency Constant  Aggravating Factors  over-exertion, "when i get tired"; reports left arm pain from sleeping in bad position last night  Pain Relieving Factors rest      04/05/18 1000  Transfers  Transfers Sit to Stand;Stand to Sit  Sit to Stand 5: Supervision;With upper extremity assist  Stand to Sit 5: Supervision;With upper extremity assist  Ambulation/Gait  Ambulation/Gait Yes  Ambulation/Gait Assistance 5: Supervision;4: Min guard  Ambulation/Gait Assistance Details trialed foot up brace with 1st lap, then posterior ottobock with 2cd laps. no brace with remainder of gait. Pt continues to have foot catching/scuffing with use of foot up, along with genu recurvatum in stance which increases when fatigued. With the ottobock brace pt did not tolerate the placement of the strut as it was digging in at her ankle. She did have improved foot clearance and knee control with this brace. Pt and spouse are agreeable to an  orthotic consult to determine the best brace for pt to use.    Ambulation Distance (Feet) 115 Feet (x1, 50 x1, around gym with activity)  Assistive device Rolling walker  Gait Pattern Decreased arm swing - right;Decreased step length - right;Decreased hip/knee flexion - right;Decreased dorsiflexion - right;Decreased weight shift to right;Right genu recurvatum;Lateral trunk lean to right;Poor foot clearance - right  Ambulation Surface Level;Indoor  High Level Balance  High Level Balance Activities Side stepping;Backward walking;Marching forwards  High Level Balance  Comments on floor with UE support on counter as needed for balance: 3 laps each with min guard to min assist for balance. cues on posture, weight shifting and ex form.    Exercises  Exercises Other Exercises  Other Exercises  with UE support on counter: heel<>toe raises with 5 sec holds each rep. cues on posture and form.        PT Short Term Goals - 03/23/18 1852      PT SHORT TERM GOAL #1   Title  Improve Berg score from 26/56 to >/= 31/56 to reduce fall risk.    Time  4    Period  Weeks    Status  New    Target Date  04/22/18      PT SHORT TERM GOAL #2   Title  Improve TUG score from 18.09 secs without device to </= 15 secs without device for improved functional mobility and reduced fall risk.    Baseline  18.09 secs    Time  4    Period  Weeks    Status  New    Target Date  04/22/18      PT SHORT TERM GOAL #3   Title  Pt will amb. 250' with RW with CGA on flat, even surface for incr. community accessibility.      Time  4    Period  Weeks    Status  New    Target Date  04/22/18      PT SHORT TERM GOAL #4   Title  Increase gait velocity from 1.53 ft/sec to >/= 1.9 ft/sec without device for incr. gait efficiency.    Baseline  21.47 secs = 1.53 ft/sec    Time  4    Period  Weeks    Status  New    Target Date  04/22/18      PT SHORT TERM GOAL #5   Title  Independent in HEP for RLE strengthening and balance exercises.    Time  4    Period  Weeks    Status  New    Target Date  04/22/18        PT Long Term Goals - 03/23/18 1857      PT LONG TERM GOAL #1   Title  Pt will increase Berg score from 26/56 to >/= 36/56 to reduce fall risk.    Baseline  26/56 on 03-22-18    Time  8    Period  Weeks    Status  New    Target Date  05/22/18      PT LONG TERM GOAL #2   Title  AFO consult for RLE will be obtained - and AFO for RLE obtained if determined to be needed.     Time  8    Period  Weeks    Status  New    Target Date  05/22/18      PT LONG TERM GOAL #3    Title  Pt will  amb. 500' with RW with supervision on flat, even surface for incr. community accessibility.    Time  8    Period  Weeks    Status  New    Target Date  05/22/18      PT LONG TERM GOAL #4   Title  Improve TUG score from 18.09 secs to </= 13.5 secs without device to reduce fall risk.    Baseline  18.09 secs on 03-22-18    Time  8    Period  Weeks    Status  New    Target Date  05/22/18      PT LONG TERM GOAL #5   Title  Increase gait velocity from 1.53 ft/sec to >/= 2.2 ft/sec without device for incr. gait efficiency.     Baseline  21.47 secs = 1.53 ft/sec with no device on 03-22-18    Time  8    Period  Weeks    Status  New    Target Date  05/22/18         04/05/18 3810  Plan  Clinical Impression Statement Today's skilled session initially focused on trial's of different braces for improved foot clearance and knee control. Will request an order from the referring MD so to have an orthotic consult in session to further determine brace options. Remainder of session continued to focus on strengthening and balance with rest breaks needed due to fatigue. The pt is progressing toward goals and should benefit from continued PT to progress toward unmet goals. Also plan to submit request for OT as recommeded by PT on eval as pt and spouse agree to this too.   Pt will benefit from skilled therapeutic intervention in order to improve on the following deficits Abnormal gait;Decreased activity tolerance;Decreased balance;Decreased coordination;Decreased strength;Impaired flexibility;Impaired tone;Impaired UE functional use;Pain  Rehab Potential Good  Clinical Impairments Affecting Rehab Potential chronicity of deficits - approx. 3 yrs since onset (due to transverse myelitis?)  PT Frequency 2x / week  PT Duration 8 weeks  PT Treatment/Interventions ADLs/Self Care Home Management;DME Instruction;Gait training;Stair training;Orthotic Fit/Training;Patient/family education;Neuromuscular  re-education;Balance training;Therapeutic exercise;Therapeutic activities;Passive range of motion  PT Next Visit Plan added balance components to HEP- feet together, modified tandem, vision removed, ? single leg stance with support; continue gait with trial of AFO's if pt wears appropriate shoes; continue to address LE strengthening.   PT Home Exercise Plan see above  Consulted and Agree with Plan of Care Patient;Family member/caregiver  Family Member Consulted spouse - [name]          Patient will benefit from skilled therapeutic intervention in order to improve the following deficits and impairments:  Abnormal gait, Decreased activity tolerance, Decreased balance, Decreased coordination, Decreased strength, Impaired flexibility, Impaired tone, Impaired UE functional use, Pain  Visit Diagnosis: Other abnormalities of gait and mobility  Muscle weakness (generalized)  Unsteadiness on feet     Problem List Patient Active Problem List   Diagnosis Date Noted  . CAD (coronary artery disease) 09/04/2013  . HTN (hypertension) 09/04/2013  . HLD (hyperlipidemia) 09/04/2013  . Tobacco abuse 09/04/2013    Willow Ora, PTA, [facility] [address] [place], [place] [address] [phone] 04/07/18, 5:50 PM   Name: KENDYL FESTA MRN: [id number] Date of Birth: Aug 01, 1934

## 2018-04-09 ENCOUNTER — Ambulatory Visit: Payer: Medicare Other | Admitting: Physical Therapy

## 2018-04-09 ENCOUNTER — Encounter: Payer: Self-pay | Admitting: Physical Therapy

## 2018-04-09 DIAGNOSIS — R2681 Unsteadiness on feet: Secondary | ICD-10-CM

## 2018-04-09 DIAGNOSIS — R2689 Other abnormalities of gait and mobility: Secondary | ICD-10-CM

## 2018-04-09 DIAGNOSIS — M6281 Muscle weakness (generalized): Secondary | ICD-10-CM

## 2018-04-09 DIAGNOSIS — R278 Other lack of coordination: Secondary | ICD-10-CM | POA: Diagnosis not present

## 2018-04-10 ENCOUNTER — Encounter: Payer: Self-pay | Admitting: Physical Therapy

## 2018-04-10 NOTE — Therapy (Signed)
[facility] [address] [place], [place], [address] Phone: [phone]   Fax:  [phone]  Physical Therapy Treatment  Patient Details  Name: Kim Sutton MRN: [id number] Date of Birth: 1934-04-28 Referring Provider: Dr. Lajean Manes   Encounter Date: 04/09/2018  PT End of Session - 04/10/18 2043    Visit Number  4    Number of Visits  17    Date for PT Re-Evaluation  05/22/18    Authorization Type  Medicare and AARP    Authorization Time Period  03-22-18 - 06-20-18    PT Start Time  0803    PT Stop Time  0847    PT Time Calculation (min)  44 min    Equipment Utilized During Treatment  Gait belt       Past Medical History:  Diagnosis Date  . Adrenal cortical adenoma of left adrenal gland    stable since 2009;  MRI in 04/2010 - no further scans  . CAD (coronary artery disease)    a. s/p NSTEMI in 2001 tx with BMS to RCA  . Colon polyps   . HLD (hyperlipidemia)   . Hx of cardiovascular stress test    Lexiscan Myoview (09/2013):  No ischemia, EF 77%, Low Risk.  Marland Kitchen Hyperglycemia   . Hypertension   . Myoclonus    on neurontin per neuro at [facility]  . Nephrolithiasis   . Nodule of right lung    a. 59mm on CXR 10/2009;  CT 05/2010 ok - no further scans  . Tobacco abuse     Past Surgical History:  Procedure Laterality Date  . CHOLECYSTECTOMY    . CORONARY ANGIOPLASTY WITH STENT PLACEMENT    . TOTAL ABDOMINAL HYSTERECTOMY      There were no vitals filed for this visit.  Subjective Assessment - 04/10/18 2033    Subjective  Pt states she is going to get some larger shoes to accomodate the AFO - wore [employer] shoes today     Pertinent History  h/o transverse myelitis;  (husband reports Rt sided weakness is due to pt having had lesion on spine due to shingles approx. 3 yrs ago):  CAD; HTN    Patient Stated Goals  "get up and walk some place"    Currently in Pain?  Yes    Pain Score  5     Pain Location  Other  (Comment)   generalized mostly on right side   Pain Orientation  Right;Left    Pain Descriptors / Indicators  Aching;Constant    Pain Type  Chronic pain    Pain Onset  More than a month ago    Pain Frequency  Constant                       [facility] Adult PT Treatment/Exercise - 04/10/18 0001      Transfers   Transfers  Sit to Stand;Stand to Sit    Sit to Stand  5: Supervision    Number of Reps  Other reps (comment)   5   Transfer Cueing  no UE support used - pt performed from mat      Ambulation/Gait   Ambulation/Gait  Yes    Ambulation/Gait Assistance  5: Supervision;4: Min guard    Ambulation/Gait Assistance Details  toe off AFO    Ambulation Distance (Feet)  230 Feet    Assistive device  Rolling walker    Gait Pattern  Decreased arm  swing - right;Decreased step length - right;Decreased hip/knee flexion - right;Decreased dorsiflexion - right;Decreased weight shift to right;Right genu recurvatum;Lateral trunk lean to right;Poor foot clearance - right    Ambulation Surface  Indoor;Level      Knee/Hip Exercises: Aerobic   Other Aerobic  SciFit level 1.5 x 5"       TherEx:  Supine -  Rt SLR x 10 reps with assistance; Rt hip flexion in hooklying x 10 reps  Seated Rt knee flexion with green theraband x 10 reps   Standing Rt hip flexion and marching with UE support on RW x 10 reps with CGA         PT Short Term Goals - 03/23/18 1852      PT SHORT TERM GOAL #1   Title  Improve Berg score from 26/56 to >/= 31/56 to reduce fall risk.    Time  4    Period  Weeks    Status  New    Target Date  04/22/18      PT SHORT TERM GOAL #2   Title  Improve TUG score from 18.09 secs without device to </= 15 secs without device for improved functional mobility and reduced fall risk.    Baseline  18.09 secs    Time  4    Period  Weeks    Status  New    Target Date  04/22/18      PT SHORT TERM GOAL #3   Title  Pt will amb. 250' with RW with CGA on flat, even surface  for incr. community accessibility.      Time  4    Period  Weeks    Status  New    Target Date  04/22/18      PT SHORT TERM GOAL #4   Title  Increase gait velocity from 1.53 ft/sec to >/= 1.9 ft/sec without device for incr. gait efficiency.    Baseline  21.47 secs = 1.53 ft/sec    Time  4    Period  Weeks    Status  New    Target Date  04/22/18      PT SHORT TERM GOAL #5   Title  Independent in HEP for RLE strengthening and balance exercises.    Time  4    Period  Weeks    Status  New    Target Date  04/22/18        PT Long Term Goals - 03/23/18 1857      PT LONG TERM GOAL #1   Title  Pt will increase Berg score from 26/56 to >/= 36/56 to reduce fall risk.    Baseline  26/56 on 03-22-18    Time  8    Period  Weeks    Status  New    Target Date  05/22/18      PT LONG TERM GOAL #2   Title  AFO consult for RLE will be obtained - and AFO for RLE obtained if determined to be needed.     Time  8    Period  Weeks    Status  New    Target Date  05/22/18      PT LONG TERM GOAL #3   Title  Pt will amb. 500' with RW with supervision on flat, even surface for incr. community accessibility.    Time  8    Period  Weeks    Status  New    Target Date  05/22/18      PT LONG TERM GOAL #4   Title  Improve TUG score from 18.09 secs to </= 13.5 secs without device to reduce fall risk.    Baseline  18.09 secs on 03-22-18    Time  8    Period  Weeks    Status  New    Target Date  05/22/18      PT LONG TERM GOAL #5   Title  Increase gait velocity from 1.53 ft/sec to >/= 2.2 ft/sec without device for incr. gait efficiency.     Baseline  21.47 secs = 1.53 ft/sec with no device on 03-22-18    Time  8    Period  Weeks    Status  New    Target Date  05/22/18            Plan - 04/10/18 2044    Clinical Impression Statement  Pt's gait improved with use of toe off AFO on RLE  - pt had approx. 3 occurrences of Rt toes catching during 230' amb. distance with use of Rt toe off AFO     Rehab Potential  Good    Clinical Impairments Affecting Rehab Potential  chronicity of deficits - approx. 3 yrs since onset (due to transverse myelitis?)    PT Frequency  2x / week    PT Duration  8 weeks    PT Treatment/Interventions  ADLs/Self Care Home Management;DME Instruction;Gait training;Stair training;Orthotic Fit/Training;Patient/family education;Neuromuscular re-education;Balance training;Therapeutic exercise;Therapeutic activities;Passive range of motion    PT Next Visit Plan  RLE strengthening - cont gait training    PT Home Exercise Plan  see above    Consulted and Agree with Plan of Care  Patient;Family member/caregiver    Family Member Consulted  spouse - [name]       Patient will benefit from skilled therapeutic intervention in order to improve the following deficits and impairments:  Abnormal gait, Decreased activity tolerance, Decreased balance, Decreased coordination, Decreased strength, Impaired flexibility, Impaired tone, Impaired UE functional use, Pain  Visit Diagnosis: Other abnormalities of gait and mobility  Muscle weakness (generalized)  Unsteadiness on feet     Problem List Patient Active Problem List   Diagnosis Date Noted  . CAD (coronary artery disease) 09/04/2013  . HTN (hypertension) 09/04/2013  . HLD (hyperlipidemia) 09/04/2013  . Tobacco abuse 09/04/2013    Alda Lea, PT 04/10/2018, 8:50 PM  [facility] [address] [place], [place], [address] Phone: [phone]   Fax:  [phone]  Name: SHYA KOVATCH MRN: [id number] Date of Birth: 06/29/1934

## 2018-04-12 ENCOUNTER — Ambulatory Visit: Payer: Medicare Other | Admitting: Physical Therapy

## 2018-04-12 DIAGNOSIS — R278 Other lack of coordination: Secondary | ICD-10-CM | POA: Diagnosis not present

## 2018-04-12 DIAGNOSIS — R2681 Unsteadiness on feet: Secondary | ICD-10-CM

## 2018-04-12 DIAGNOSIS — R2689 Other abnormalities of gait and mobility: Secondary | ICD-10-CM | POA: Diagnosis not present

## 2018-04-12 DIAGNOSIS — M6281 Muscle weakness (generalized): Secondary | ICD-10-CM

## 2018-04-13 NOTE — Therapy (Signed)
[facility] [address] [place], [place], [address] Phone: [phone]   Fax:  [phone]  Physical Therapy Treatment  Patient Details  Name: Kim Sutton MRN: [id number] Date of Birth: 08-28-1933 Referring Provider: Dr. Lajean Manes   Encounter Date: 04/12/2018  PT End of Session - 04/13/18 0942    Visit Number  5    Number of Visits  17    Date for PT Re-Evaluation  05/22/18    Authorization Type  Medicare and AARP    Authorization Time Period  03-22-18 - 06-20-18    PT Start Time  0931    PT Stop Time  1017    PT Time Calculation (min)  46 min    Equipment Utilized During Treatment  Gait belt       Past Medical History:  Diagnosis Date  . Adrenal cortical adenoma of left adrenal gland    stable since 2009;  MRI in 04/2010 - no further scans  . CAD (coronary artery disease)    a. s/p NSTEMI in 2001 tx with BMS to RCA  . Colon polyps   . HLD (hyperlipidemia)   . Hx of cardiovascular stress test    Lexiscan Myoview (09/2013):  No ischemia, EF 77%, Low Risk.  Marland Kitchen Hyperglycemia   . Hypertension   . Myoclonus    on neurontin per neuro at [facility]  . Nephrolithiasis   . Nodule of right lung    a. 73mm on CXR 10/2009;  CT 05/2010 ok - no further scans  . Tobacco abuse     Past Surgical History:  Procedure Laterality Date  . CHOLECYSTECTOMY    . CORONARY ANGIOPLASTY WITH STENT PLACEMENT    . TOTAL ABDOMINAL HYSTERECTOMY      There were no vitals filed for this visit.  Subjective Assessment - 04/13/18 0931    Subjective  Pt states she purchased some new larger sneakers, but husband accidently left them at home - forgot to bring them today; pt wearing snug-fitting slip on shoe (unable to accomodate an AFO)    Patient is accompained by:  Family member   spouse [name])   Patient Stated Goals  "get up and walk some place"    Currently in Pain?  No/denies                       [facility] Adult PT  Treatment/Exercise - 04/13/18 0001      Transfers   Transfers  Sit to Stand    Sit to Stand  5: Supervision    Number of Reps  Other reps (comment)   5   Transfer Cueing  no    Comments  no UE support used      Ambulation/Gait   Ambulation/Gait  Yes    Ambulation/Gait Assistance  5: Supervision    Ambulation/Gait Assistance Details  no AFO used due to pt wearing shoes that will not accomodate an AFO;  pt states larger sneakers were purchased but they forgot them today    Ambulation Distance (Feet)  230 Feet   2 consecutive laps without seated rest period   Assistive device  Rolling walker    Gait Pattern  Decreased arm swing - right;Decreased step length - right;Decreased hip/knee flexion - right;Decreased dorsiflexion - right;Decreased weight shift to right;Right genu recurvatum;Lateral trunk lean to right;Poor foot clearance - right    Ambulation Surface  Level;Indoor    Stairs  Yes    Stairs  Assistance  4: Min guard    Stair Management Technique  Two rails;Step to pattern;Forwards    Number of Stairs  4    Height of Stairs  6      Exercises   Exercises  Knee/Hip;Ankle      Knee/Hip Exercises: Aerobic   Recumbent Bike  SciFit level 1.5 x 5" with UE's and LE's      Knee/Hip Exercises: Standing   Heel Raises  Both;1 set;10 reps    Hip Flexion  Stengthening;Right;1 set;10 reps;Knee bent;Knee straight   1 set 10 reps with each knee position   Hip Abduction  Stengthening;Right;1 set;10 reps;Knee straight   with 2# weight   Hip Extension  Stengthening;Right;1 set;10 reps;Knee straight   with 2# weight      NeuroRe-ed;  Ladder on floor - pt performed 2 reps with Lt hand held mod assist - using step over step sequence for improved  SLS and to facilitate increased step length  Pt performed SLS activity - touching balance bubble with each foot with min HHA  X 2 reps; cone taps to 4 cones with each foot  with mod HHA - pt had some difficulty touching top of cone due to Rt hip  flexor weakness         PT Short Term Goals - 03/23/18 1852      PT SHORT TERM GOAL #1   Title  Improve Berg score from 26/56 to >/= 31/56 to reduce fall risk.    Time  4    Period  Weeks    Status  New    Target Date  04/22/18      PT SHORT TERM GOAL #2   Title  Improve TUG score from 18.09 secs without device to </= 15 secs without device for improved functional mobility and reduced fall risk.    Baseline  18.09 secs    Time  4    Period  Weeks    Status  New    Target Date  04/22/18      PT SHORT TERM GOAL #3   Title  Pt will amb. 250' with RW with CGA on flat, even surface for incr. community accessibility.      Time  4    Period  Weeks    Status  New    Target Date  04/22/18      PT SHORT TERM GOAL #4   Title  Increase gait velocity from 1.53 ft/sec to >/= 1.9 ft/sec without device for incr. gait efficiency.    Baseline  21.47 secs = 1.53 ft/sec    Time  4    Period  Weeks    Status  New    Target Date  04/22/18      PT SHORT TERM GOAL #5   Title  Independent in HEP for RLE strengthening and balance exercises.    Time  4    Period  Weeks    Status  New    Target Date  04/22/18        PT Long Term Goals - 03/23/18 1857      PT LONG TERM GOAL #1   Title  Pt will increase Berg score from 26/56 to >/= 36/56 to reduce fall risk.    Baseline  26/56 on 03-22-18    Time  8    Period  Weeks    Status  New    Target Date  05/22/18      PT LONG  TERM GOAL #2   Title  AFO consult for RLE will be obtained - and AFO for RLE obtained if determined to be needed.     Time  8    Period  Weeks    Status  New    Target Date  05/22/18      PT LONG TERM GOAL #3   Title  Pt will amb. 500' with RW with supervision on flat, even surface for incr. community accessibility.    Time  8    Period  Weeks    Status  New    Target Date  05/22/18      PT LONG TERM GOAL #4   Title  Improve TUG score from 18.09 secs to </= 13.5 secs without device to reduce fall risk.     Baseline  18.09 secs on 03-22-18    Time  8    Period  Weeks    Status  New    Target Date  05/22/18      PT LONG TERM GOAL #5   Title  Increase gait velocity from 1.53 ft/sec to >/= 2.2 ft/sec without device for incr. gait efficiency.     Baseline  21.47 secs = 1.53 ft/sec with no device on 03-22-18    Time  8    Period  Weeks    Status  New    Target Date  05/22/18            Plan - 04/13/18 0943    Clinical Impression Statement  Pt had 2 occurrences of Rt foot catching floor (occurred in approx. same location on track (across from // bars)) on each lap of gait training; with exception of these 2 occurrences, pt appeared to have improved Rt foot clearance overall as decreased Rt foot clearance appears to be due to weakness in Rt hip flexors and hamstrings and not Rt dorsiflexor weakness only;  still awaiting referral from MD for OT and for AFO consult     Rehab Potential  Good    Clinical Impairments Affecting Rehab Potential  chronicity of deficits - approx. 3 yrs since onset (due to transverse myelitis?)    PT Frequency  2x / week    PT Duration  8 weeks    PT Treatment/Interventions  ADLs/Self Care Home Management;DME Instruction;Gait training;Stair training;Orthotic Fit/Training;Patient/family education;Neuromuscular re-education;Balance training;Therapeutic exercise;Therapeutic activities;Passive range of motion    PT Next Visit Plan  check to see if orders for OT & AFO returned from MD- if not, will need to fax paper copy; cont gait train with AFO if pt brings new shoes, cont balance and RLE strengthening exs.; check STG's by 04-22-18    PT Home Exercise Plan  see above    Consulted and Agree with Plan of Care  Patient;Family member/caregiver    Family Member Consulted  spouse - [name]       Patient will benefit from skilled therapeutic intervention in order to improve the following deficits and impairments:  Abnormal gait, Decreased activity tolerance, Decreased balance,  Decreased coordination, Decreased strength, Impaired flexibility, Impaired tone, Impaired UE functional use, Pain  Visit Diagnosis: Other abnormalities of gait and mobility  Muscle weakness (generalized)  Unsteadiness on feet     Problem List Patient Active Problem List   Diagnosis Date Noted  . CAD (coronary artery disease) 09/04/2013  . HTN (hypertension) 09/04/2013  . HLD (hyperlipidemia) 09/04/2013  . Tobacco abuse 09/04/2013    Alda Lea, PT 04/13/2018, 10:05 AM  [facility] [address] [place], [place], [address] Phone: [phone]   Fax:  [phone]  Name: PERCILLA TWETEN MRN: [id number] Date of Birth: 1934/05/30

## 2018-04-17 ENCOUNTER — Ambulatory Visit: Payer: Medicare Other | Admitting: Physical Therapy

## 2018-04-17 DIAGNOSIS — R2689 Other abnormalities of gait and mobility: Secondary | ICD-10-CM

## 2018-04-17 DIAGNOSIS — R2681 Unsteadiness on feet: Secondary | ICD-10-CM

## 2018-04-17 DIAGNOSIS — M6281 Muscle weakness (generalized): Secondary | ICD-10-CM

## 2018-04-17 DIAGNOSIS — R278 Other lack of coordination: Secondary | ICD-10-CM | POA: Diagnosis not present

## 2018-04-17 NOTE — Therapy (Signed)
[facility] [address] [place], [place], [address] Phone: [phone]   Fax:  [phone]  Physical Therapy Treatment  Patient Details  Name: Kim Sutton MRN: [id number] Date of Birth: July 23, 1934 Referring Provider: Dr. Lajean Manes   Encounter Date: 04/17/2018  PT End of Session - 04/17/18 1109    Visit Number  6    Number of Visits  17    Date for PT Re-Evaluation  05/22/18    Authorization Type  Medicare and AARP    Authorization Time Period  03-22-18 - 06-20-18    PT Start Time  1015    PT Stop Time  1100    PT Time Calculation (min)  45 min    Equipment Utilized During Treatment  Gait belt       Past Medical History:  Diagnosis Date  . Adrenal cortical adenoma of left adrenal gland    stable since 2009;  MRI in 04/2010 - no further scans  . CAD (coronary artery disease)    a. s/p NSTEMI in 2001 tx with BMS to RCA  . Colon polyps   . HLD (hyperlipidemia)   . Hx of cardiovascular stress test    Lexiscan Myoview (09/2013):  No ischemia, EF 77%, Low Risk.  Marland Kitchen Hyperglycemia   . Hypertension   . Myoclonus    on neurontin per neuro at [facility]  . Nephrolithiasis   . Nodule of right lung    a. 55m on CXR 10/2009;  CT 05/2010 ok - no further scans  . Tobacco abuse     Past Surgical History:  Procedure Laterality Date  . CHOLECYSTECTOMY    . CORONARY ANGIOPLASTY WITH STENT PLACEMENT    . TOTAL ABDOMINAL HYSTERECTOMY      There were no vitals filed for this visit.  Subjective Assessment - 04/17/18 1019    Subjective  No falls to report, pt brought new shoes for AFO.     Currently in Pain?  No/denies         [facility]      Standardized Balance Assessment   Standardized Balance Assessment  Berg Balance Test;Timed Up and Go Test      Timed Up and Go Test   TUG  Normal TUG    Normal TUG (seconds)  16.72   with AFO/no AD     [facility] Adult PT Treatment/Exercise - 04/17/18  1036      Ambulation/Gait   Ambulation/Gait  Yes    Ambulation/Gait Assistance  5: Supervision    Ambulation/Gait Assistance Details  toe off AFO, VC's for posture, distance from AD, proper technique.     Ambulation Distance (Feet)  250 Feet    Assistive device  Rolling walker    Gait Pattern  Decreased step length - right;Decreased hip/knee flexion - right;Decreased dorsiflexion - right;Decreased weight shift to right;Right genu recurvatum;Lateral trunk lean to right    Ambulation Surface  Level;Indoor    Gait velocity  17.44 secs = 1.88 ft./sec with AFO, no device      Knee/Hip Exercises: Standing   Heel Raises  Both;1 set;10 reps    Hip Flexion  Stengthening;Both;1 set;10 reps;Knee straight    Hip Flexion Limitations  Cues for proper technique, posture and to avoid compensation.     Hip Abduction  Stengthening;Both;1 set;10 reps;Knee straight   Cues for    Abduction Limitations  Cues to avoid lateral lean with each rep and posture.  Hip Extension  Stengthening;Both;1 set;10 reps;Knee straight   VC's for posture and looking forward       PT Short Term Goals - 04/17/18 1111      PT SHORT TERM GOAL #1   Title  Improve Berg score from 26/56 to >/= 31/56 to reduce fall risk.    Time  4    Period  Weeks    Status  On-going      PT SHORT TERM GOAL #2   Title  Improve TUG score from 18.09 secs without device to </= 15 secs without device for improved functional mobility and reduced fall risk.    Baseline  9/17: 16.72 secs without AD/AFO donned. Improved.     Time  4    Period  Weeks    Status  Not Met      PT SHORT TERM GOAL #3   Title  Pt will amb. 250' with RW with CGA on flat, even surface for incr. community accessibility.      Baseline  9/17: Pt amb. 250' with RW, Supervision on even surfaces.     Time  4    Period  Weeks    Status  Achieved      PT SHORT TERM GOAL #4   Title  Increase gait velocity from 1.53 ft/sec to >/= 1.9 ft/sec without device for incr. gait  efficiency.    Baseline  9/17: 17.44secs = 1.10f./sec with no AD/ AFO donned; Improved.    Time  4    Period  Weeks    Status  Not Met      PT SHORT TERM GOAL #5   Title  Independent in HEP for RLE strengthening and balance exercises.    Time  4    Period  Weeks    Status  On-going        PT Long Term Goals - 03/23/18 1857      PT LONG TERM GOAL #1   Title  Pt will increase Berg score from 26/56 to >/= 36/56 to reduce fall risk.    Baseline  26/56 on 03-22-18    Time  8    Period  Weeks    Status  New    Target Date  05/22/18      PT LONG TERM GOAL #2   Title  AFO consult for RLE will be obtained - and AFO for RLE obtained if determined to be needed.     Time  8    Period  Weeks    Status  New    Target Date  05/22/18      PT LONG TERM GOAL #3   Title  Pt will amb. 500' with RW with supervision on flat, even surface for incr. community accessibility.    Time  8    Period  Weeks    Status  New    Target Date  05/22/18      PT LONG TERM GOAL #4   Title  Improve TUG score from 18.09 secs to </= 13.5 secs without device to reduce fall risk.    Baseline  18.09 secs on 03-22-18    Time  8    Period  Weeks    Status  New    Target Date  05/22/18      PT LONG TERM GOAL #5   Title  Increase gait velocity from 1.53 ft/sec to >/= 2.2 ft/sec without device for incr. gait efficiency.     Baseline  21.47 secs = 1.53 ft/sec with no device on 03-22-18    Time  8    Period  Weeks    Status  New    Target Date  05/22/18       Plan - 04/17/18 1110    Clinical Impression Statement  Today's skilled therapy session focused on Gait with RLE AFO, LE strengthening and STG's; pt has met 1/5 goals/ improved 2/5 goals so far. Pt showed increased stability, decreased toe drag/foot slap with AFO donned this session. Pt should benefit from continued PT sessions to progress towards unmet goals.    Rehab Potential  Good    Clinical Impairments Affecting Rehab Potential  chronicity of  deficits - approx. 3 yrs since onset (due to transverse myelitis?)    PT Frequency  2x / week    PT Duration  8 weeks    PT Treatment/Interventions  ADLs/Self Care Home Management;DME Instruction;Gait training;Stair training;Orthotic Fit/Training;Patient/family education;Neuromuscular re-education;Balance training;Therapeutic exercise;Therapeutic activities;Passive range of motion    PT Next Visit Plan   Check remaining STG's, cont balance and RLE strengthening exercise, gait with AFO.     PT Home Exercise Plan  see above    Consulted and Agree with Plan of Care  Patient;Family member/caregiver    Family Member Consulted  spouse - [name]       Patient will benefit from skilled therapeutic intervention in order to improve the following deficits and impairments:  Abnormal gait, Decreased activity tolerance, Decreased balance, Decreased coordination, Decreased strength, Impaired flexibility, Impaired tone, Impaired UE functional use, Pain  Visit Diagnosis: Other abnormalities of gait and mobility  Muscle weakness (generalized)  Unsteadiness on feet   Problem List Patient Active Problem List   Diagnosis Date Noted  . CAD (coronary artery disease) 09/04/2013  . HTN (hypertension) 09/04/2013  . HLD (hyperlipidemia) 09/04/2013  . Tobacco abuse 09/04/2013   Seabron Iannello, PTA  Alyona Romack A Edye Hainline 04/17/2018, 3:36 PM  [facility] [address] [place], [place], [address] Phone: [phone]   Fax:  [phone]  Name: LENNETTE FADER MRN: [id number] Date of Birth: [birth year]

## 2018-04-20 ENCOUNTER — Ambulatory Visit: Payer: Medicare Other

## 2018-04-20 DIAGNOSIS — R2681 Unsteadiness on feet: Secondary | ICD-10-CM | POA: Diagnosis not present

## 2018-04-20 DIAGNOSIS — R278 Other lack of coordination: Secondary | ICD-10-CM | POA: Diagnosis not present

## 2018-04-20 DIAGNOSIS — R2689 Other abnormalities of gait and mobility: Secondary | ICD-10-CM

## 2018-04-20 DIAGNOSIS — M6281 Muscle weakness (generalized): Secondary | ICD-10-CM | POA: Diagnosis not present

## 2018-04-20 NOTE — Therapy (Signed)
[facility] [address] [place], [place], [address] Phone: [phone]   Fax:  [phone]  Physical Therapy Treatment  Patient Details  Name: Kim Sutton MRN: [id number] Date of Birth: 07-11-1934 Referring Provider: Dr. Lajean Manes   Encounter Date: 04/20/2018  PT End of Session - 04/20/18 0938    Visit Number  7    Number of Visits  17    Date for PT Re-Evaluation  05/22/18    Authorization Type  Medicare and AARP    Authorization Time Period  03-22-18 - 06-20-18    PT Start Time  0930    PT Stop Time  1015    PT Time Calculation (min)  45 min    Equipment Utilized During Treatment  Gait belt       Past Medical History:  Diagnosis Date  . Adrenal cortical adenoma of left adrenal gland    stable since 2009;  MRI in 04/2010 - no further scans  . CAD (coronary artery disease)    a. s/p NSTEMI in 2001 tx with BMS to RCA  . Colon polyps   . HLD (hyperlipidemia)   . Hx of cardiovascular stress test    Lexiscan Myoview (09/2013):  No ischemia, EF 77%, Low Risk.  Marland Kitchen Hyperglycemia   . Hypertension   . Myoclonus    on neurontin per neuro at [facility]  . Nephrolithiasis   . Nodule of right lung    a. 4m on CXR 10/2009;  CT 05/2010 ok - no further scans  . Tobacco abuse     Past Surgical History:  Procedure Laterality Date  . CHOLECYSTECTOMY    . CORONARY ANGIOPLASTY WITH STENT PLACEMENT    . TOTAL ABDOMINAL HYSTERECTOMY      There were no vitals filed for this visit.  Subjective Assessment - 04/20/18 0938    Subjective  No new complants, no falls to report. Pt states that HEP is going well.     Pertinent History  h/o transverse myelitis;  (husband reports Rt sided weakness is due to pt having had lesion on spine due to shingles approx. 3 yrs ago):  CAD; HTN    Patient Stated Goals  "get up and walk some place"    Currently in Pain?  No/denies    Pain Onset  More than a month ago         [facility]  Assessment - 04/20/18 0942      Berg Balance Test   Sit to Stand  Able to stand without using hands and stabilize independently    Standing Unsupported  Able to stand safely 2 minutes    Sitting with Back Unsupported but Feet Supported on Floor or Stool  Able to sit safely and securely 2 minutes    Stand to Sit  Sits safely with minimal use of hands    Transfers  Able to transfer safely, minor use of hands    Standing Unsupported with Eyes Closed  Able to stand 10 seconds with supervision    Standing Ubsupported with Feet Together  Able to place feet together independently and stand 1 minute safely    From Standing, Reach Forward with Outstretched Arm  Can reach confidently >25 cm (10")    From Standing Position, Pick up Object from Floor  Able to pick up shoe, needs supervision    From Standing Position, Turn to Look Behind Over each Shoulder  Looks behind one side only/other side shows less weight  shift    Turn 360 Degrees  Able to turn 360 degrees safely one side only in 4 seconds or less    Standing Unsupported, Alternately Place Feet on Step/Stool  Needs assistance to keep from falling or unable to try    Standing Unsupported, One Foot in [place] [employer] to take small step independently and hold 30 seconds    Standing on One Leg  Tries to lift leg/unable to hold 3 seconds but remains standing independently    Total Score  43/56      [facility] Adult PT Treatment/Exercise - 04/20/18 1025      Ambulation/Gait   Ambulation/Gait  Yes    Ambulation/Gait Assistance  5: Supervision    Ambulation/Gait Assistance Details  Toe off AFO donned, VC's for posture and distance from AD.     Ambulation Distance (Feet)  207 Feet    Assistive device  Rollator    Gait Pattern  Decreased step length - right;Decreased hip/knee flexion - right;Decreased dorsiflexion - right;Decreased weight shift to right;Right genu recurvatum;Lateral trunk lean to right    Ambulation Surface  Level;Indoor      High Level  Balance   High Level Balance Activities  Marching forwards;Backward walking;Tandem walking    High Level Balance Comments  On red mat at counter with SUE support , 3 laps each, CGA for safety, VC's for posture, looking forward and proper weight shift.         PT Short Term Goals - 04/20/18 1039      PT SHORT TERM GOAL #1   Title  Improve Berg score from 26/56 to >/= 31/56 to reduce fall risk.    Baseline  9/20: 43/56     Time  4    Period  Weeks    Status  Achieved      PT SHORT TERM GOAL #2   Title  Improve TUG score from 18.09 secs without device to </= 15 secs without device for improved functional mobility and reduced fall risk.    Baseline  9/17: 16.72 secs without AD/AFO donned. Improved.     Time  4    Period  Weeks    Status  Not Met      PT SHORT TERM GOAL #3   Title  Pt will amb. 250' with RW with CGA on flat, even surface for incr. community accessibility.      Baseline  9/17: Pt amb. 250' with RW, Supervision on even surfaces.     Time  4    Period  Weeks    Status  Achieved      PT SHORT TERM GOAL #4   Title  Increase gait velocity from 1.53 ft/sec to >/= 1.9 ft/sec without device for incr. gait efficiency.    Baseline  9/17: 17.44secs = 1.59f./sec with no AD/ AFO donned; Improved.    Time  4    Period  Weeks    Status  Not Met      PT SHORT TERM GOAL #5   Title  Independent in HEP for RLE strengthening and balance exercises.    Baseline  9/20: Pt states she is independent with HEP.    Time  4    Period  Weeks    Status  Achieved        PT Long Term Goals - 03/23/18 1857      PT LONG TERM GOAL #1   Title  Pt will increase Berg score from 26/56 to >/= 36/56  to reduce fall risk.    Baseline  26/56 on 03-22-18    Time  8    Period  Weeks    Status  New    Target Date  05/22/18      PT LONG TERM GOAL #2   Title  AFO consult for RLE will be obtained - and AFO for RLE obtained if determined to be needed.     Time  8    Period  Weeks    Status  New     Target Date  05/22/18      PT LONG TERM GOAL #3   Title  Pt will amb. 500' with RW with supervision on flat, even surface for incr. community accessibility.    Time  8    Period  Weeks    Status  New    Target Date  05/22/18      PT LONG TERM GOAL #4   Title  Improve TUG score from 18.09 secs to </= 13.5 secs without device to reduce fall risk.    Baseline  18.09 secs on 03-22-18    Time  8    Period  Weeks    Status  New    Target Date  05/22/18      PT LONG TERM GOAL #5   Title  Increase gait velocity from 1.53 ft/sec to >/= 2.2 ft/sec without device for incr. gait efficiency.     Baseline  21.47 secs = 1.53 ft/sec with no device on 03-22-18    Time  8    Period  Weeks    Status  New    Target Date  05/22/18        Plan - 04/20/18 1034    Clinical Impression Statement  Todays skilled therapy session focused on cheching remaining STG's with a total of 3/5 goals achieved, gait training with rollator walker/AFO and high level balance activities. Pt is progressing towards goals and should benefit from continued PT to work towards [employer].     Rehab Potential  Good    Clinical Impairments Affecting Rehab Potential  chronicity of deficits - approx. 3 yrs since onset (due to transverse myelitis?)    PT Frequency  2x / week    PT Duration  8 weeks    PT Treatment/Interventions  ADLs/Self Care Home Management;DME Instruction;Gait training;Stair training;Orthotic Fit/Training;Patient/family education;Neuromuscular re-education;Balance training;Therapeutic exercise;Therapeutic activities;Passive range of motion    PT Next Visit Plan   Cont high level balance activities, RLE strengthening exercise and gait with AFO.     PT Home Exercise Plan  see above    Consulted and Agree with Plan of Care  Patient;Family member/caregiver    Family Member Consulted  spouse - [name]       Patient will benefit from skilled therapeutic intervention in order to improve the following deficits and  impairments:  Abnormal gait, Decreased activity tolerance, Decreased balance, Decreased coordination, Decreased strength, Impaired flexibility, Impaired tone, Impaired UE functional use, Pain  Visit Diagnosis: Other abnormalities of gait and mobility  Muscle weakness (generalized)  Unsteadiness on feet     Problem List Patient Active Problem List   Diagnosis Date Noted  . CAD (coronary artery disease) 09/04/2013  . HTN (hypertension) 09/04/2013  . HLD (hyperlipidemia) 09/04/2013  . Tobacco abuse 09/04/2013   Bricen Victory, PTA  Egidio Lofgren A Mirha Brucato 04/20/2018, 10:48 AM  [facility] [address] [place], [place], [address] Phone: [phone]   Fax:  [phone]  Name: GWYNDOLYN GUILFORD MRN: [id number] Date of Birth: 04-Nov-1933

## 2018-04-24 ENCOUNTER — Ambulatory Visit: Payer: Medicare Other

## 2018-04-24 DIAGNOSIS — M6281 Muscle weakness (generalized): Secondary | ICD-10-CM | POA: Diagnosis not present

## 2018-04-24 DIAGNOSIS — R2681 Unsteadiness on feet: Secondary | ICD-10-CM

## 2018-04-24 DIAGNOSIS — R2689 Other abnormalities of gait and mobility: Secondary | ICD-10-CM

## 2018-04-24 DIAGNOSIS — R278 Other lack of coordination: Secondary | ICD-10-CM | POA: Diagnosis not present

## 2018-04-24 NOTE — Therapy (Signed)
[facility] [address] [place], [place], [address] Phone: [phone]   Fax:  [phone]  Physical Therapy Treatment  Patient Details  Name: Kim Sutton MRN: [id number] Date of Birth: [birth year] Referring Provider: Dr. Lajean Manes   Encounter Date: 04/24/2018  PT End of Session - 04/24/18 0935    Visit Number  8    Number of Visits  17    Date for PT Re-Evaluation  05/22/18    Authorization Type  Medicare and [employer]    Authorization Time Period  03-22-18 - 06-20-18    PT Start Time  0930    PT Stop Time  1015    PT Time Calculation (min)  45 min    Equipment Utilized During Treatment  Gait belt    Activity Tolerance  Patient tolerated treatment well;No increased pain;Patient limited by fatigue    Behavior During Therapy  [facility] for tasks assessed/performed       Past Medical History:  Diagnosis Date  . Adrenal cortical adenoma of left adrenal gland    stable since 2009;  MRI in 04/2010 - no further scans  . CAD (coronary artery disease)    a. s/p NSTEMI in 2001 tx with BMS to RCA  . Colon polyps   . HLD (hyperlipidemia)   . Hx of cardiovascular stress test    Lexiscan Myoview (09/2013):  No ischemia, EF 77%, Low Risk.  Marland Kitchen Hyperglycemia   . Hypertension   . Myoclonus    on neurontin per neuro at [facility]  . Nephrolithiasis   . Nodule of right lung    a. 53m on CXR 10/2009;  CT 05/2010 ok - no further scans  . Tobacco abuse     Past Surgical History:  Procedure Laterality Date  . CHOLECYSTECTOMY    . CORONARY ANGIOPLASTY WITH STENT PLACEMENT    . TOTAL ABDOMINAL HYSTERECTOMY      There were no vitals filed for this visit.  Subjective Assessment - 04/24/18 0933    Subjective  No falls to report, pt stated she had a loss of balance but did not fall.     Patient is accompained by:  Family member   Spouse   Pertinent History  h/o transverse myelitis;  (husband reports Rt sided weakness is due to pt having  had lesion on spine due to shingles approx. 3 yrs ago):  CAD; HTN    Patient Stated Goals  "get up and walk some place"    Currently in Pain?  No/denies        [facility] - 04/[facility]/18 0936      Ambulation/Gait   Ambulation/Gait  Yes    Ambulation/Gait Assistance  5: Supervision    Ambulation/Gait Assistance Details  Toe off AFO donned, VC's for posture and proper use of AD.     Ambulation Distance (Feet)  414 Feet    Assistive device  Rollator    Gait Pattern  Decreased step length - right;Decreased hip/knee flexion - right;Decreased dorsiflexion - right;Decreased weight shift to right;Right genu recurvatum;Lateral trunk lean to right    Ambulation Surface  Level;Indoor    Stairs  Yes    Stairs Assistance  4: Min guard    Stairs Assistance Details (indicate cue type and reason)  VC's for proper technique, alternating pattern, use of rails, heel clearance.     Stair Management Technique  Two rails;Alternating pattern;Forwards;Other (comment)   RLE AFO   Number of Stairs  4  Height of Stairs  6      Neuro Re-ed    Neuro Re-ed Details   Pt in parallel bars performing toe taps to cones with BUE/SUE support, side stepping over foam bubbles with SUE support required, walking on foam bubbles with alt. pattern BUE/SUE support required, x3 laps each task, seated rest break required after each task. VC's for proper technique, posture, looking forward, and increasing hip/knee flexion to advance/increase height of RLE for cone taps.         PT Short Term Goals - 04/24/18 0935      PT SHORT TERM GOAL #1   Title  Improve Berg score from 26/56 to >/= 31/56 to reduce fall risk.    Baseline  9/20: 43/56     Time  4    Period  Weeks    Status  Achieved      PT SHORT TERM GOAL #2   Title  Improve TUG score from 18.09 secs without device to </= 15 secs without device for improved functional mobility and reduced fall risk.    Baseline  9/17: 16.72 secs without AD/AFO  donned. Improved.     Time  4    Period  Weeks    Status  Not Met      PT SHORT TERM GOAL #3   Title  Pt will amb. 250' with RW with CGA on flat, even surface for incr. community accessibility.      Baseline  9/17: Pt amb. 250' with RW, Supervision on even surfaces.     Time  4    Period  Weeks    Status  Achieved      PT SHORT TERM GOAL #4   Title  Increase gait velocity from 1.53 ft/sec to >/= 1.9 ft/sec without device for incr. gait efficiency.    Baseline  9/17: 17.44secs = 1.24f./sec with no AD/ AFO donned; Improved.    Time  4    Period  Weeks    Status  Not Met      PT SHORT TERM GOAL #5   Title  Independent in HEP for RLE strengthening and balance exercises.    Baseline  9/20: Pt states she is independent with HEP.    Time  4    Period  Weeks    Status  Achieved        PT Long Term Goals - 04/24/18 0935      PT LONG TERM GOAL #1   Title  Pt will increase Berg score from 26/56 to >/= 36/56 to reduce fall risk.    Baseline  26/56 on 03-22-18    Time  8    Period  Weeks    Status  New      PT LONG TERM GOAL #2   Title  AFO consult for RLE will be obtained - and AFO for RLE obtained if determined to be needed.     Time  8    Period  Weeks    Status  New      PT LONG TERM GOAL #3   Title  Pt will amb. 500' with RW with supervision on flat, even surface for incr. community accessibility.    Time  8    Period  Weeks    Status  New      PT LONG TERM GOAL #4   Title  Improve TUG score from 18.09 secs to </= 13.5 secs without device to reduce fall risk.  Baseline  18.09 secs on 03-22-18    Time  8    Period  Weeks    Status  New      PT LONG TERM GOAL #5   Title  Increase gait velocity from 1.53 ft/sec to >/= 2.2 ft/sec without device for incr. gait efficiency.     Baseline  21.47 secs = 1.53 ft/sec with no device on 03-22-18    Time  8    Period  Weeks    Status  New        Plan - 04/24/18 1152    Clinical Impression Statement  Todays skilled therapy  session continued to focus on gait training with rollator/AFO and high level balance activities with less support/assistance required. Pt is progressing towards goals and should benefit from PT to continue to progress towards goals.     Rehab Potential  Good    Clinical Impairments Affecting Rehab Potential  chronicity of deficits - approx. 3 yrs since onset (due to transverse myelitis?)    PT Frequency  2x / week    PT Duration  8 weeks    PT Treatment/Interventions  ADLs/Self Care Home Management;DME Instruction;Gait training;Stair training;Orthotic Fit/Training;Patient/family education;Neuromuscular re-education;Balance training;Therapeutic exercise;Therapeutic activities;Passive range of motion    PT Next Visit Plan   Cont high level balance activities, RLE strengthening exercise and gait with AFO.     PT Home Exercise Plan  see above    Consulted and Agree with Plan of Care  Patient;Family member/caregiver    Family Member Consulted  spouse - [name]       Patient will benefit from skilled therapeutic intervention in order to improve the following deficits and impairments:  Abnormal gait, Decreased activity tolerance, Decreased balance, Decreased coordination, Decreased strength, Impaired flexibility, Impaired tone, Impaired UE functional use, Pain  Visit Diagnosis: Other abnormalities of gait and mobility  Muscle weakness (generalized)  Unsteadiness on feet     Problem List Patient Active Problem List   Diagnosis Date Noted  . CAD (coronary artery disease) 09/04/2013  . HTN (hypertension) 09/04/2013  . HLD (hyperlipidemia) 09/04/2013  . Tobacco abuse 09/04/2013   Chassity Felts, PTA  Chassity A Felts 04/24/2018, 12:38 PM  [facility] [address] [place], [place], [address] Phone: [phone]   Fax:  [phone]  Name: CAROLENE GITTO MRN: [id number] Date of Birth: 03-03-1934

## 2018-04-26 ENCOUNTER — Ambulatory Visit: Payer: Medicare Other | Admitting: Occupational Therapy

## 2018-04-26 ENCOUNTER — Encounter: Payer: Self-pay | Admitting: Occupational Therapy

## 2018-04-26 ENCOUNTER — Ambulatory Visit: Payer: Medicare Other | Admitting: Physical Therapy

## 2018-04-26 ENCOUNTER — Encounter: Payer: Self-pay | Admitting: Physical Therapy

## 2018-04-26 ENCOUNTER — Other Ambulatory Visit: Payer: Self-pay

## 2018-04-26 DIAGNOSIS — R2681 Unsteadiness on feet: Secondary | ICD-10-CM

## 2018-04-26 DIAGNOSIS — R2689 Other abnormalities of gait and mobility: Secondary | ICD-10-CM | POA: Diagnosis not present

## 2018-04-26 DIAGNOSIS — M6281 Muscle weakness (generalized): Secondary | ICD-10-CM | POA: Diagnosis not present

## 2018-04-26 DIAGNOSIS — R278 Other lack of coordination: Secondary | ICD-10-CM | POA: Diagnosis not present

## 2018-04-26 NOTE — Therapy (Signed)
[facility] [address] [place], [place], [address] Phone: [phone]   Fax:  [phone]  Physical Therapy Treatment  Patient Details  Name: Kim Sutton MRN: [id number] Date of Birth: 20-Feb-1934 Referring Provider (PT): Dr. Lajean Manes   Encounter Date: 04/26/2018  PT End of Session - 04/26/18 0941    Visit Number  9    Number of Visits  17    Date for PT Re-Evaluation  05/22/18    Authorization Type  Medicare and AARP    Authorization Time Period  03-22-18 - 06-20-18    PT Start Time  0935    PT Stop Time  1014    PT Time Calculation (min)  39 min    Equipment Utilized During Treatment  Gait belt    Activity Tolerance  Patient tolerated treatment well;No increased pain;Patient limited by fatigue    Behavior During Therapy  [facility] for tasks assessed/performed       Past Medical History:  Diagnosis Date  . Adrenal cortical adenoma of left adrenal gland    stable since 2009;  MRI in 04/2010 - no further scans  . CAD (coronary artery disease)    a. s/p NSTEMI in 2001 tx with BMS to RCA  . Colon polyps   . HLD (hyperlipidemia)   . Hx of cardiovascular stress test    Lexiscan Myoview (09/2013):  No ischemia, EF 77%, Low Risk.  Marland Kitchen Hyperglycemia   . Hypertension   . Myoclonus    on neurontin per neuro at [facility]  . Nephrolithiasis   . Nodule of right lung    a. 39m on CXR 10/2009;  CT 05/2010 ok - no further scans  . Tobacco abuse     Past Surgical History:  Procedure Laterality Date  . CHOLECYSTECTOMY    . CORONARY ANGIOPLASTY WITH STENT PLACEMENT    . TOTAL ABDOMINAL HYSTERECTOMY      There were no vitals filed for this visit.  Subjective Assessment - 04/26/18 0936    Subjective  No falls, just some stumbles to report. Reports being achy all over.     Patient is accompained by:  Family member   spouse, [name]  Pertinent History  h/o transverse myelitis;  (husband reports Rt sided weakness is due to pt  having had lesion on spine due to shingles approx. 3 yrs ago):  CAD; HTN    Patient Stated Goals  "get up and walk some place"    Currently in Pain?  No/denies    Pain Score  0-No pain            [facility] Adult PT Treatment/Exercise - 04/26/18 0942      Transfers   Transfers  Sit to Stand;Stand Pivot Transfers    Sit to Stand  4: Min guard;With upper extremity assist;From bed;From chair/3-in-1    Sit to Stand Details  Verbal cues for sequencing;Verbal cues for precautions/safety;Verbal cues for safe use of DME/AE    Sit to Stand Details (indicate cue type and reason)  cues for safety with standing to rollator/brake use    Stand to Sit  5: Supervision;With upper extremity assist;Without upper extremity assist;To bed;To chair/3-in-1    Stand to Sit Details (indicate cue type and reason)  Verbal cues for sequencing;Verbal cues for safe use of DME/AE;Verbal cues for precautions/safety    Stand to Sit Details  cues for safety with rollator when sitting down/brake use      Ambulation/Gait   Ambulation/Gait  Yes    Ambulation/Gait Assistance  5: Supervision    Ambulation/Gait Assistance Details  donned clinic toe off brace for 1st lap indoors with pt continuing to have genu recurvatum. added a small heel wedge for 2cd lap and remainder of session with mild improvement noted until pt became fatigued with gait outdoors with rollator. cues needed with all gait for rollator position with gait, posture and hand placement on rollator brakes.                         Ambulation Distance (Feet)  210 Feet   x2, 500 x1   Assistive device  Rollator    Gait Pattern  Decreased step length - right;Decreased hip/knee flexion - right;Decreased dorsiflexion - right;Decreased weight shift to right;Right genu recurvatum;Lateral trunk lean to right    Ambulation Surface  Level;Indoor;Unlevel;Outdoor;Paved    Ramp  4: Min assist    Ramp Details (indicate cue type and reason)  with rollator. cues needed for  sequencing/technique.     Curb  4: Min assist    Curb Details (indicate cue type and reason)  wtih rollator. cues needed on sequencing and technique.       Self-Care   Self-Care  Other Self-Care Comments    Other Self-Care Comments   discussed that we recieved the order from her MD for OT and the brace. Her spouse went and set OT appt for today. Discussed option to go to orthotist for brace or have them come here. They perfer them to come here. Will call and arrange this.           PT Short Term Goals - 04/24/18 0935      PT SHORT TERM GOAL #1   Title  Improve Berg score from 26/56 to >/= 31/56 to reduce fall risk.    Baseline  9/20: 43/56     Time  4    Period  Weeks    Status  Achieved      PT SHORT TERM GOAL #2   Title  Improve TUG score from 18.09 secs without device to </= 15 secs without device for improved functional mobility and reduced fall risk.    Baseline  9/17: 16.72 secs without AD/AFO donned. Improved.     Time  4    Period  Weeks    Status  Not Met      PT SHORT TERM GOAL #3   Title  Pt will amb. 250' with RW with CGA on flat, even surface for incr. community accessibility.      Baseline  9/17: Pt amb. 250' with RW, Supervision on even surfaces.     Time  4    Period  Weeks    Status  Achieved      PT SHORT TERM GOAL #4   Title  Increase gait velocity from 1.53 ft/sec to >/= 1.9 ft/sec without device for incr. gait efficiency.    Baseline  9/17: 17.44secs = 1.37f./sec with no AD/ AFO donned; Improved.    Time  4    Period  Weeks    Status  Not Met      PT SHORT TERM GOAL #5   Title  Independent in HEP for RLE strengthening and balance exercises.    Baseline  9/20: Pt states she is independent with HEP.    Time  4    Period  Weeks    Status  Achieved  PT Long Term Goals - 04/24/18 0935      PT LONG TERM GOAL #1   Title  Pt will increase Berg score from 26/56 to >/= 36/56 to reduce fall risk.    Baseline  26/56 on 03-22-18    Time  8     Period  Weeks    Status  New      PT LONG TERM GOAL #2   Title  AFO consult for RLE will be obtained - and AFO for RLE obtained if determined to be needed.     Time  8    Period  Weeks    Status  New      PT LONG TERM GOAL #3   Title  Pt will amb. 500' with RW with supervision on flat, even surface for incr. community accessibility.    Time  8    Period  Weeks    Status  New      PT LONG TERM GOAL #4   Title  Improve TUG score from 18.09 secs to </= 13.5 secs without device to reduce fall risk.    Baseline  18.09 secs on 03-22-18    Time  8    Period  Weeks    Status  New      PT LONG TERM GOAL #5   Title  Increase gait velocity from 1.53 ft/sec to >/= 2.2 ft/sec without device for incr. gait efficiency.     Baseline  21.47 secs = 1.53 ft/sec with no device on 03-22-18    Time  8    Period  Weeks    Status  New            Plan - 04/26/18 5916    Clinical Impression Statement  Today's skilled session continued to address gait with brace and rollator on various surfaces/barriers. The pt continues to need cues for safety with rollator. The pt also continued to present with genu recurvatum with gait despite brace and heel wedge. Will arrange for an orthotic consult at one of her upcoming appts. The pt is progressing toward goals and should benefit from continued PT to progress toward unmet goals.     Rehab Potential  Good    Clinical Impairments Affecting Rehab Potential  chronicity of deficits - approx. 3 yrs since onset (due to transverse myelitis?)    PT Frequency  2x / week    PT Duration  8 weeks    PT Treatment/Interventions  ADLs/Self Care Home Management;DME Instruction;Gait training;Stair training;Orthotic Fit/Training;Patient/family education;Neuromuscular re-education;Balance training;Therapeutic exercise;Therapeutic activities;Passive range of motion    PT Next Visit Plan  emphasize right LE strengthening for improved knee control with gait, balance reactions with  decr UE support    PT Home Exercise Plan  see above    Recommended Other Services  OT consult recieved and eval done on 04/26/18; recieved orthotic order for brace-will call to set appt and update here when date for appt is recieved.    Consulted and Agree with Plan of Care  Patient;Family member/caregiver    Family Member Consulted  spouse - [name]       Patient will benefit from skilled therapeutic intervention in order to improve the following deficits and impairments:  Abnormal gait, Decreased activity tolerance, Decreased balance, Decreased coordination, Decreased strength, Impaired flexibility, Impaired tone, Impaired UE functional use, Pain  Visit Diagnosis: Muscle weakness (generalized)  Other abnormalities of gait and mobility  Unsteadiness on feet     Problem List Patient  Active Problem List   Diagnosis Date Noted  . CAD (coronary artery disease) 09/04/2013  . HTN (hypertension) 09/04/2013  . HLD (hyperlipidemia) 09/04/2013  . Tobacco abuse 09/04/2013   Willow Ora, PTA, [facility] [address] [place], [place] [address] [phone] 04/26/18, 10:04 PM   Name: KAILEE ESSMAN MRN: [id number] Date of Birth: 1934/06/30

## 2018-04-26 NOTE — Therapy (Signed)
[facility] [address] [place], [place], [address] Phone: [phone]   Fax:  [phone]  Occupational Therapy Evaluation  Patient Details  Name: Kim Sutton MRN: [id number] Date of Birth: 06-Jun-1934 No data recorded  Encounter Date: 04/26/2018  OT End of Session - 04/26/18 1135    Visit Number  1    Number of Visits  17    Date for OT Re-Evaluation  06/25/18    Authorization Type  cert. period  04/26/18-07/24/18    Authorization Time Period  Medicare / AARP    Authorization - Visit Number  1    Authorization - Number of Visits  10    OT Start Time  1017    OT Stop Time  1100    OT Time Calculation (min)  43 min    Activity Tolerance  Patient tolerated treatment well    Behavior During Therapy  WFL for tasks assessed/performed       Past Medical History:  Diagnosis Date  . Adrenal cortical adenoma of left adrenal gland    stable since 2009;  MRI in 04/2010 - no further scans  . CAD (coronary artery disease)    a. s/p NSTEMI in 2001 tx with BMS to RCA  . Colon polyps   . HLD (hyperlipidemia)   . Hx of cardiovascular stress test    Lexiscan Myoview (09/2013):  No ischemia, EF 77%, Low Risk.  Marland Kitchen Hyperglycemia   . Hypertension   . Myoclonus    on neurontin per neuro at [facility]  . Nephrolithiasis   . Nodule of right lung    a. 75mm on CXR 10/2009;  CT 05/2010 ok - no further scans  . Tobacco abuse     Past Surgical History:  Procedure Laterality Date  . CHOLECYSTECTOMY    . CORONARY ANGIOPLASTY WITH STENT PLACEMENT    . TOTAL ABDOMINAL HYSTERECTOMY      There were no vitals filed for this visit.  Subjective Assessment - 04/26/18 1020    Patient is accompained by:  Family member   husband   Pertinent History  PMH:  transverse myelitis, hx of shingles per husband; CAD; HTN, HLD, hard of hearing    Limitations  fall risk, hard of hearing    Patient Stated Goals  improve ability to pick up things, use RUE  more for cleaning, strengthening    Currently in Pain?  No/denies        [facility] OT Assessment - 04/26/18 1020      Assessment   Medical Diagnosis  R hemiparesis    Onset Date/Surgical Date  --   approx 3 years ago   Hand Dominance  Right    Prior Therapy  no OT    Referring Provider (PT)  Dr. Lajean Manes      Precautions   Precautions  Fall      Balance Screen   Has the patient fallen in the past 6 months  Yes    How many times?  2      Home  Environment   Family/patient expects to be discharged to:  Private residence    Living Arrangements  Spouse/significant other      Prior Function   Level of [place] with household mobility without device;Independent with community mobility with device;Independent with basic ADLs    Leisure  doing activities around the house      ADL   Eating/Feeding  Set up   <25%  with RUE, needs assist for cutting meat, opening bottle   Eating/Feeding Patient Percentage  --   uses kitchen scissors for packages with RUE   Grooming  --   primarily using LUE, brushing teeth with RUE   Upper Body Bathing  --   mod I   Lower Body Bathing  --   mod I   Upper Body Dressing  --   mod I   Lower Body Dressing  --   intermittent min A for pants, doesn't wear fastening   Toilet Transfer  --   using grab bar, LUE assisting RUE on bar   Toileting - Clothing Manipulation  Modified independent    Toileting -  Visual merchandiser  Grab bars   built in seat--doesn't use     IADL   Prior Level of Function Shopping  uses cart, husband carries groceries in, but able to go herself    Prior Level of Function Light Housekeeping  husband performs heavier tasks, pt performs light tasks    Prior Level of Function Meal Prep  Husband performs most of the cooking, chopping carrying heavier pots/pans, opening containers; pt able to perform simple snack prep and  simple things     Investment banker, corporate own vehicle    Medication Management  --   mod I     Mobility   Mobility Status  History of falls    Mobility Status Comments  Ambulates with RW in the community, no device in the home      Written Expression   Dominant Hand  Right    Handwriting  --   only writes short notes/signs name     Cognition   Overall Cognitive Status  Within Functional Limits for tasks assessed      Observation/Other Assessments   Other Surveys   Select    Physical Performance Test    Yes    Simulated Eating Time (seconds)  23.34    Simulated Eating Comments  held spoon between 2nd and 3rd digits      Sensation   Additional Comments  BUEs only at night when sleeps on arm      Coordination   9 Hole Peg Test  Right;Left    Right 9 Hole Peg Test  59.38   leans   Left 9 Hole Peg Test  28.07    Box and Blocks  R-22blocks, L-42blocks      ROM / Strength   AROM / PROM / Strength  AROM;Strength      AROM   Overall AROM   Deficits    Overall AROM Comments  LUE grossly WNL, R shoulder flex 75*, abduction 60*, ER 50%, IR WNL, elbow flex/ext WNL, supination/pronation WNL, wrist ext WNL, unable to fully oppose to digit 5      Strength   Overall Strength  Deficits    Overall Strength Comments  LUE grossly WNL, RUE shoulder strength grossly 3 to 3+/5, biceps/triceps 3+/5    Strength Assessment Site  Shoulder    Right/Left Shoulder  Right      Hand Function   Right Hand Grip (lbs)  12    Right Hand Lateral Pinch  9 lbs    Right Hand 3 Point Pinch  4 lbs    Left Hand Grip (lbs)  40    Left Hand Lateral Pinch  13 lbs    Left 3 point  pinch  7 lbs                        OT Short Term Goals - 04/26/18 1228      OT SHORT TERM GOAL #1   Title  Pt will be independent with coordination, RUE strength HEP.--check STGs 05/25/18    Time  4    Period  Weeks    Status  New      OT SHORT TERM GOAL #2   Title  Pt will improve RUE functional use  for eating as shown by improving PPT#2 by at least 5sec.    Baseline  23.34sec    Time  4    Period  Weeks    Status  New      OT SHORT TERM GOAL #3   Title  Pt will improve coordination for ADLs as shown by improving time on 9-hole peg test by at least 10sec.    Baseline  59.38sec    Time  4    Period  Weeks    Status  New      OT SHORT TERM GOAL #4   Title  Pt will improve R grip strength for opening containers/lifting objects by at least 5lbs.    Baseline  12lbs    Time  4    Period  Weeks    Status  New      OT SHORT TERM GOAL #5   Title  Pt will be able to demo at least 85* R shoulder flex for functional reaching.    Baseline  75*    Time  8    Period  Weeks    Status  New        OT Long Term Goals - 04/26/18 1229      OT LONG TERM GOAL #1   Title  Pt will be able to cut food mod I using AE prn.    Time  8    Period  Weeks    Status  New      OT LONG TERM GOAL #2   Title  Pt will be able to use RUE to eat with at least 50% of meal.    Time  8    Period  Weeks    Status  New      OT LONG TERM GOAL #3   Title  Pt will improve RUE functional reaching/coordination for ADLs as shown by improving score on box and blocks test by at least 8.    Baseline  22    Time  8    Period  Weeks    Status  New      OT LONG TERM GOAL #4   Title  Pt will improve R grip strength for opening containers/lifting objects by at least 10lbs.    Baseline  12lbs    Period  Weeks    Status  New      OT LONG TERM GOAL #5   Title  Pt will be able to use RUE for cleaning/home maintenance tasks for at least 14min prior to rest break.    Time  8    Period  Weeks    Status  New      OT LONG TERM GOAL #6   Title  Pt will be able to demo at least 95* R shoulder flex for functional reaching.    Baseline  75*    Time  8  Period  Weeks    Status  New            Plan - 04/26/18 1239    Clinical Impression Statement  Pt is an 84 y.o. female with R hemiparesis due to transverse  myelitis.  Pt with PMH of transverse myelitis (approx 3 years ago), hx of shingles per husband,CAD, HTN, HLD, hard of hearing.  Pt presents today with R hemiparesis affecting dominant RUE functional use, decr strength, decr coordination, decr balance/functional mobility.  Pt would benefit from occupational thearpy to address these deficits for improved RUE functional use and improved ADL/IADL performance.    Occupational Profile and client history currently impacting functional performance  Pt is now limited in IADL performance, particularly when using dominant RUE.  Pt has to switch to nondominant LUE during ADLs/IADLs.      Occupational performance deficits (Please refer to evaluation for details):  ADL's;IADL's;Leisure;Social Participation    Rehab Potential  Good    OT Frequency  2x / week    OT Duration  8 weeks   +eval   OT Treatment/Interventions  Self-care/ADL training;Therapeutic exercise;Patient/family education;Neuromuscular education;Moist Heat;Fluidtherapy;Energy conservation;Therapist, nutritional;Therapeutic activities;Balance training;Passive range of motion;Manual Therapy;DME and/or AE instruction;Cryotherapy    Plan  initiate HEP (cane/ball ex in supine, putty ex, coordination)    Clinical Decision Making  Several treatment options, min-mod task modification necessary    Consulted and Agree with Plan of Care  Patient;Family member/caregiver    Family Member Consulted  husband       Patient will benefit from skilled therapeutic intervention in order to improve the following deficits and impairments:  Decreased knowledge of use of DME, Decreased coordination, Decreased mobility, Decreased strength, Decreased range of motion, Decreased endurance, Decreased activity tolerance, Decreased balance, Impaired UE functional use  Visit Diagnosis: Muscle weakness (generalized)  Other abnormalities of gait and mobility  Unsteadiness on feet  Other lack of  coordination    Problem List Patient Active Problem List   Diagnosis Date Noted  . CAD (coronary artery disease) 09/04/2013  . HTN (hypertension) 09/04/2013  . HLD (hyperlipidemia) 09/04/2013  . Tobacco abuse 09/04/2013    [facility] 04/26/2018, 12:49 PM  [facility] [address] [place], [place], [address] Phone: [phone]   Fax:  [phone]  Name: Kim Sutton MRN: [id number] Date of Birth: 02-20-1934   Vianne Bulls, OTR/L [facility] [address] [place], [place]  [address] [phone] phone [phone] 04/26/18 12:49 PM

## 2018-05-01 ENCOUNTER — Ambulatory Visit: Payer: Medicare Other | Attending: Geriatric Medicine | Admitting: Physical Therapy

## 2018-05-01 DIAGNOSIS — R2689 Other abnormalities of gait and mobility: Secondary | ICD-10-CM | POA: Diagnosis not present

## 2018-05-01 DIAGNOSIS — M6281 Muscle weakness (generalized): Secondary | ICD-10-CM | POA: Diagnosis not present

## 2018-05-01 DIAGNOSIS — R278 Other lack of coordination: Secondary | ICD-10-CM | POA: Diagnosis not present

## 2018-05-01 DIAGNOSIS — R2681 Unsteadiness on feet: Secondary | ICD-10-CM

## 2018-05-02 NOTE — Therapy (Signed)
[facility] [address] [place], [place], [address] Phone: [phone]   Fax:  [phone]  Physical Therapy Treatment  Patient Details  Name: Kim Sutton MRN: [id number] Date of Birth: May 11, 1934 Referring Provider (PT): Dr. Lajean Manes   Encounter Date: 05/01/2018  PT End of Session - 05/02/18 0908    Visit Number  10    Number of Visits  17    Date for PT Re-Evaluation  05/22/18    Authorization Type  Medicare and AARP    Authorization Time Period  03-22-18 - 06-20-18    PT Start Time  0847    PT Stop Time  0931    PT Time Calculation (min)  44 min    Equipment Utilized During Treatment  Gait belt    Activity Tolerance  Patient tolerated treatment well    Behavior During Therapy  [facility] for tasks assessed/performed       Past Medical History:  Diagnosis Date  . Adrenal cortical adenoma of left adrenal gland    stable since 2009;  MRI in 04/2010 - no further scans  . CAD (coronary artery disease)    a. s/p NSTEMI in 2001 tx with BMS to RCA  . Colon polyps   . HLD (hyperlipidemia)   . Hx of cardiovascular stress test    Lexiscan Myoview (09/2013):  No ischemia, EF 77%, Low Risk.  Marland Kitchen Hyperglycemia   . Hypertension   . Myoclonus    on neurontin per neuro at [facility]  . Nephrolithiasis   . Nodule of right lung    a. 69m on CXR 10/2009;  CT 05/2010 ok - no further scans  . Tobacco abuse     Past Surgical History:  Procedure Laterality Date  . CHOLECYSTECTOMY    . CORONARY ANGIOPLASTY WITH STENT PLACEMENT    . TOTAL ABDOMINAL HYSTERECTOMY      There were no vitals filed for this visit.  Subjective Assessment - 05/02/18 0859    Subjective  Pt reports she is tired this morning; husband states pt is walking better, lifting right leg better    Patient is accompained by:  Family member   husband, [name]  Pertinent History  h/o transverse myelitis;  (husband reports Rt sided weakness is due to pt having had  lesion on spine due to shingles approx. 3 yrs ago):  CAD; HTN    Patient Stated Goals  "get up and walk some place"    Currently in Pain?  No/denies                       [facility] Adult PT Treatment/Exercise - 05/02/18 0001      Transfers   Transfers  Sit to Stand    Sit to Stand  4: Min guard;4: Min assist    Number of Reps  10 reps   5 reps feet on floor; 5 reps feet on Airex   Comments  SBA for sit to stand with feet on floor: min to CGA with feet on Airex      Ambulation/Gait   Ambulation/Gait  Yes    Ambulation/Gait Assistance  4: Min guard    Ambulation/Gait Assistance Details  AFO not used during PT session today    Ambulation Distance (Feet)  100 Feet    Assistive device  None    Gait Pattern  Decreased step length - right;Decreased hip/knee flexion - right;Decreased dorsiflexion - right;Decreased weight shift to right;Right genu  recurvatum;Lateral trunk lean to right    Ambulation Surface  Level;Indoor    Stairs  Yes    Stairs Assistance  5: Supervision    Stair Management Technique  Two rails;Alternating pattern;Forwards    Number of Stairs  4   2 reps   Height of Stairs  6      Neuro Re-ed    Neuro Re-ed Details   Pt performed SLS activity of touching balance bubbles (3) with each foot 3 reps with CGA:  then used 2 balance bubbles for touching in a diagonal pattern for improved balance with LE crossing midline - CGA to min assist for balance      TherEx:  Pt perfomed standing hip flexion, extension, abduction with 2# weight on RLE 10 reps each direction Lifting RLE over and back of quad cane placed on floor for approx. 3" height - 10 reps forward, then laterally 10 reps For Rt hip abductor strengthening  - with UE support on PT's forearms prn  standing Rt knee flexion with 2# weight 10 reps - cues to hold flexed position at 90 degrees for 1-2 secs as able  Balance Exercises - 05/02/18 0906      Balance Exercises: Standing   Step Ups  Forward;6 inch;UE  support 1   10 reps RLE - PT blocked Rt knee to prevent hyperextension    Other Standing Exercises  Marching 10 reps each while standing on blue Airex with min assist           PT Short Term Goals - 04/24/18 0935      PT SHORT TERM GOAL #1   Title  Improve Berg score from 26/56 to >/= 31/56 to reduce fall risk.    Baseline  9/20: 43/56     Time  4    Period  Weeks    Status  Achieved      PT SHORT TERM GOAL #2   Title  Improve TUG score from 18.09 secs without device to </= 15 secs without device for improved functional mobility and reduced fall risk.    Baseline  9/17: 16.72 secs without AD/AFO donned. Improved.     Time  4    Period  Weeks    Status  Not Met      PT SHORT TERM GOAL #3   Title  Pt will amb. 250' with RW with CGA on flat, even surface for incr. community accessibility.      Baseline  9/17: Pt amb. 250' with RW, Supervision on even surfaces.     Time  4    Period  Weeks    Status  Achieved      PT SHORT TERM GOAL #4   Title  Increase gait velocity from 1.53 ft/sec to >/= 1.9 ft/sec without device for incr. gait efficiency.    Baseline  9/17: 17.44secs = 1.14f./sec with no AD/ AFO donned; Improved.    Time  4    Period  Weeks    Status  Not Met      PT SHORT TERM GOAL #5   Title  Independent in HEP for RLE strengthening and balance exercises.    Baseline  9/20: Pt states she is independent with HEP.    Time  4    Period  Weeks    Status  Achieved        PT Long Term Goals - 04/24/18 0935      PT LONG TERM GOAL #1   Title  Pt will increase Berg score from 26/56 to >/= 36/56 to reduce fall risk.    Baseline  26/56 on 03-22-18    Time  8    Period  Weeks    Status  New      PT LONG TERM GOAL #2   Title  AFO consult for RLE will be obtained - and AFO for RLE obtained if determined to be needed.     Time  8    Period  Weeks    Status  New      PT LONG TERM GOAL #3   Title  Pt will amb. 500' with RW with supervision on flat, even surface for  incr. community accessibility.    Time  8    Period  Weeks    Status  New      PT LONG TERM GOAL #4   Title  Improve TUG score from 18.09 secs to </= 13.5 secs without device to reduce fall risk.    Baseline  18.09 secs on 03-22-18    Time  8    Period  Weeks    Status  New      PT LONG TERM GOAL #5   Title  Increase gait velocity from 1.53 ft/sec to >/= 2.2 ft/sec without device for incr. gait efficiency.     Baseline  21.47 secs = 1.53 ft/sec with no device on 03-22-18    Time  8    Period  Weeks    Status  New            Plan - 05/02/18 0912    Clinical Impression Statement  This 10th visit progress note covers dates 03-22-18 - 05-01-18:  pt has made good progress with improving gait, balance and RLE strength:  pt will be obtaining AFO for RLE (orthotic consult will be scheduled as order for AFO has recently been obtained).  Pt is now amb. with use of RW to increase safety with gait.  STG's # 1, 3-5 have been achieved with STG #2 partially met with TUG score improved to 16.72 secs  but not fully met stated goal of </= 15 secs without assistive device.      Rehab Potential  Good    Clinical Impairments Affecting Rehab Potential  chronicity of deficits - approx. 3 yrs since onset (due to transverse myelitis?)    PT Frequency  2x / week    PT Duration  8 weeks    PT Treatment/Interventions  ADLs/Self Care Home Management;DME Instruction;Gait training;Stair training;Orthotic Fit/Training;Patient/family education;Neuromuscular re-education;Balance training;Therapeutic exercise;Therapeutic activities;Passive range of motion    PT Next Visit Plan  emphasize right LE strengthening for improved knee control with gait, balance reactions with decr UE support    PT Home Exercise Plan  see above    Consulted and Agree with Plan of Care  Patient;Family member/caregiver    Family Member Consulted  spouse - [name]       Patient will benefit from skilled therapeutic intervention in order to  improve the following deficits and impairments:  Abnormal gait, Decreased activity tolerance, Decreased balance, Decreased coordination, Decreased strength, Impaired flexibility, Impaired tone, Impaired UE functional use, Pain  Visit Diagnosis: Muscle weakness (generalized)  Other abnormalities of gait and mobility  Unsteadiness on feet     Problem List Patient Active Problem List   Diagnosis Date Noted  . CAD (coronary artery disease) 09/04/2013  . HTN (hypertension) 09/04/2013  . HLD (hyperlipidemia) 09/04/2013  . Tobacco abuse 09/04/2013  Alda Lea, PT 05/02/2018, 9:18 AM  [facility] [address] [place], [place], [address] Phone: [phone]   Fax:  [phone]  Name: Kim Sutton MRN: [id number] Date of Birth: [birth year]

## 2018-05-03 ENCOUNTER — Encounter: Payer: Self-pay | Admitting: Physical Therapy

## 2018-05-03 ENCOUNTER — Ambulatory Visit: Payer: Medicare Other | Admitting: Physical Therapy

## 2018-05-03 DIAGNOSIS — R278 Other lack of coordination: Secondary | ICD-10-CM | POA: Diagnosis not present

## 2018-05-03 DIAGNOSIS — M6281 Muscle weakness (generalized): Secondary | ICD-10-CM

## 2018-05-03 DIAGNOSIS — R2681 Unsteadiness on feet: Secondary | ICD-10-CM

## 2018-05-03 DIAGNOSIS — R2689 Other abnormalities of gait and mobility: Secondary | ICD-10-CM | POA: Diagnosis not present

## 2018-05-03 NOTE — Therapy (Signed)
[facility] [address] [place], [place], [address] Phone: [phone]   Fax:  [phone]  Physical Therapy Treatment  Patient Details  Name: Kim Sutton MRN: [id number] Date of Birth: Dec 04, 1933 Referring Provider (PT): Dr. Lajean Manes   Encounter Date: 05/03/2018  PT End of Session - 05/03/18 0937    Visit Number  11    Number of Visits  17    Date for PT Re-Evaluation  05/22/18    Authorization Type  Medicare and [employer]ARP    Authorization Time Period  03-22-18 - 06-20-18    PT Start Time  0932    PT Stop Time  1014    PT Time Calculation (min)  42 min    Equipment Utilized During Treatment  Gait belt    Activity Tolerance  Patient tolerated treatment well    Behavior During Therapy  [facility] for tasks assessed/performed       Past Medical History:  Diagnosis Date  . Adrenal cortical adenoma of left adrenal gland    stable since 2009;  MRI in 04/2010 - no further scans  . CAD (coronary artery disease)    a. s/p NSTEMI in 2001 tx with BMS to RCA  . Colon polyps   . HLD (hyperlipidemia)   . Hx of cardiovascular stress test    Lexiscan Myoview (09/2013):  No ischemia, EF 77%, Low Risk.  Marland Kitchen Hyperglycemia   . Hypertension   . Myoclonus    on neurontin per neuro at [facility]  . Nephrolithiasis   . Nodule of right lung    a. 12m on CXR 10/2009;  CT 05/2010 ok - no further scans  . Tobacco abuse     Past Surgical History:  Procedure Laterality Date  . CHOLECYSTECTOMY    . CORONARY ANGIOPLASTY WITH STENT PLACEMENT    . TOTAL ABDOMINAL HYSTERECTOMY      There were no vitals filed for this visit.  Subjective Assessment - 05/03/18 0937    Subjective  No new complaints. No falls or pain to report.     Patient is accompained by:  Family member   spouse, [name]  Pertinent History  h/o transverse myelitis;  (husband reports Rt sided weakness is due to pt having had lesion on spine due to shingles approx. 3 yrs ago):  CAD;  HTN    Patient Stated Goals  "get up and walk some place"    Currently in Pain?  No/denies    Pain Score  0-No pain          [facility] Adult PT Treatment/Exercise - 05/03/18 0938      Transfers   Transfers  Sit to Stand;Stand to Sit    Sit to Stand  5: Supervision;With upper extremity assist;Without upper extremity assist;From chair/3-in-1;From bed    Stand to Sit  5: Supervision;With upper extremity assist;Without upper extremity assist;To bed;To chair/3-in-1    Number of Reps  1 set;Other reps (comment)   5 reps   Comments  with feet on airex. cues for full      Ambulation/Gait   Ambulation/Gait  Yes    Ambulation/Gait Assistance  5: Supervision    Ambulation/Gait Assistance Details  cues needed for foot clearance and walker position. with no AD: min guard assist with cues on posture, increased step length and for increased step height for improved right foot clearance.     Ambulation Distance (Feet)  100 Feet   x2 in/out of gym; 30 x4 no  AD   Assistive device  Rolling walker;None    Gait Pattern  Decreased step length - right;Decreased hip/knee flexion - right;Decreased dorsiflexion - right;Decreased weight shift to right;Right genu recurvatum;Lateral trunk lean to right    Ambulation Surface  Level;Indoor      High Level Balance   High Level Balance Activities  Side stepping;Marching forwards;Marching backwards;Tandem walking   tandem fwd/bwd   High Level Balance Comments  on red mats with single UE support on counter top: 3 laps each with cues on posture, weight shifting and ex form. min guard to min assist for balance.           Balance Exercises - 05/03/18 1009      Balance Exercises: Standing   Standing Eyes Closed  Narrow base of support (BOS);Wide (BOA);Head turns;Foam/compliant surface;Other reps (comment);30 secs;Limitations    SLS with Vectors  Foam/compliant surface;Upper extremity assist 1;Limitations    Step Over Hurdles / Cones  4 small hurdles over red mats:  reciprocal stepping over them x 6 laps with single UE support on counter. min assist for balance with cues on increased hip/knee flexion and step length to clear hurdles.       Balance Exercises: Standing   Standing Eyes Closed Limitations  on airex in corner with chair in front for safety: wide base of support progressing to narrow base of support for EC no head movements, progressing to wide base of support for EC with head movements left<>right, then up<>down. min guard to min assist for balance with cues on posture and weight shifitng to correct balance. pt demo'd posterior bias with EC.      SLS with Vectors Limitations  6 cones along edges of both red mats next to counter top, HHA on right: alternating toe taps to each with side stepping left<>right x 1 lap each way with min assist, cues on posture, stance position and weight shifitng to assist with balance.            PT Short Term Goals - 04/24/18 0935      PT SHORT TERM GOAL #1   Title  Improve Berg score from 26/56 to >/= 31/56 to reduce fall risk.    Baseline  9/20: 43/56     Time  4    Period  Weeks    Status  Achieved      PT SHORT TERM GOAL #2   Title  Improve TUG score from 18.09 secs without device to </= 15 secs without device for improved functional mobility and reduced fall risk.    Baseline  9/17: 16.72 secs without AD/AFO donned. Improved.     Time  4    Period  Weeks    Status  Not Met      PT SHORT TERM GOAL #3   Title  Pt will amb. 250' with RW with CGA on flat, even surface for incr. community accessibility.      Baseline  9/17: Pt amb. 250' with RW, Supervision on even surfaces.     Time  4    Period  Weeks    Status  Achieved      PT SHORT TERM GOAL #4   Title  Increase gait velocity from 1.53 ft/sec to >/= 1.9 ft/sec without device for incr. gait efficiency.    Baseline  9/17: 17.44secs = 1.62f./sec with no AD/ AFO donned; Improved.    Time  4    Period  Weeks    Status  Not Met  PT SHORT TERM  GOAL #5   Title  Independent in HEP for RLE strengthening and balance exercises.    Baseline  9/20: Pt states she is independent with HEP.    Time  4    Period  Weeks    Status  Achieved        PT Long Term Goals - 04/24/18 0935      PT LONG TERM GOAL #1   Title  Pt will increase Berg score from 26/56 to >/= 36/56 to reduce fall risk.    Baseline  26/56 on 03-22-18    Time  8    Period  Weeks    Status  New      PT LONG TERM GOAL #2   Title  AFO consult for RLE will be obtained - and AFO for RLE obtained if determined to be needed.     Time  8    Period  Weeks    Status  New      PT LONG TERM GOAL #3   Title  Pt will amb. 500' with RW with supervision on flat, even surface for incr. community accessibility.    Time  8    Period  Weeks    Status  New      PT LONG TERM GOAL #4   Title  Improve TUG score from 18.09 secs to </= 13.5 secs without device to reduce fall risk.    Baseline  18.09 secs on 03-22-18    Time  8    Period  Weeks    Status  New      PT LONG TERM GOAL #5   Title  Increase gait velocity from 1.53 ft/sec to >/= 2.2 ft/sec without device for incr. gait efficiency.     Baseline  21.47 secs = 1.53 ft/sec with no device on 03-22-18    Time  8    Period  Weeks    Status  New            Plan - 05/03/18 2774    Clinical Impression Statement  Today's skilled session continued to address gait with no AD and balance reactions. Did not use brace today with foot/toe scuffing noted on compliant surfaces and with fatigue. The pt is progressing toward goals and should benefit from continued PT to progress toward unmet goals.     Rehab Potential  Good    Clinical Impairments Affecting Rehab Potential  chronicity of deficits - approx. 3 yrs since onset (due to transverse myelitis?)    PT Frequency  2x / week    PT Duration  8 weeks    PT Treatment/Interventions  ADLs/Self Care Home Management;DME Instruction;Gait training;Stair training;Orthotic  Fit/Training;Patient/family education;Neuromuscular re-education;Balance training;Therapeutic exercise;Therapeutic activities;Passive range of motion    PT Next Visit Plan  emphasize right LE strengthening for improved knee control with gait, balance reactions with decr UE support    PT Home Exercise Plan  see above    Consulted and Agree with Plan of Care  Patient;Family member/caregiver    Family Member Consulted  spouse - [name]       Patient will benefit from skilled therapeutic intervention in order to improve the following deficits and impairments:  Abnormal gait, Decreased activity tolerance, Decreased balance, Decreased coordination, Decreased strength, Impaired flexibility, Impaired tone, Impaired UE functional use, Pain  Visit Diagnosis: Muscle weakness (generalized)  Other abnormalities of gait and mobility  Unsteadiness on feet     Problem List Patient Active  Problem List   Diagnosis Date Noted  . CAD (coronary artery disease) 09/04/2013  . HTN (hypertension) 09/04/2013  . HLD (hyperlipidemia) 09/04/2013  . Tobacco abuse 09/04/2013    Willow Ora, PTA, [facility] [address], [place], [place] [address] [phone] 05/03/18, 4:10 PM   Name: Kim Sutton MRN: [id number] Date of Birth: 12/07/1933

## 2018-05-08 ENCOUNTER — Encounter: Payer: Self-pay | Admitting: Occupational Therapy

## 2018-05-08 ENCOUNTER — Ambulatory Visit: Payer: Medicare Other

## 2018-05-08 ENCOUNTER — Ambulatory Visit: Payer: Medicare Other | Admitting: Occupational Therapy

## 2018-05-08 DIAGNOSIS — R278 Other lack of coordination: Secondary | ICD-10-CM | POA: Diagnosis not present

## 2018-05-08 DIAGNOSIS — R2689 Other abnormalities of gait and mobility: Secondary | ICD-10-CM | POA: Diagnosis not present

## 2018-05-08 DIAGNOSIS — M6281 Muscle weakness (generalized): Secondary | ICD-10-CM

## 2018-05-08 DIAGNOSIS — R2681 Unsteadiness on feet: Secondary | ICD-10-CM

## 2018-05-08 NOTE — Therapy (Signed)
[facility] [address] [place], [place], [address] Phone: [phone]   Fax:  [phone]  Physical Therapy Treatment  Patient Details  Name: Kim Sutton MRN: [id number] Date of Birth: May 23, 1934 Referring Provider (PT): Dr. Lajean Manes   Encounter Date: 05/08/2018  PT End of Session - 05/08/18 1014    Visit Number  12    Number of Visits  17    Date for PT Re-Evaluation  05/22/18    Authorization Type  Medicare and AARP    Authorization Time Period  03-22-18 - 06-20-18    PT Start Time  0933    PT Stop Time  1012    PT Time Calculation (min)  39 min    Equipment Utilized During Treatment  --   min guard to min A for safety   Activity Tolerance  Patient tolerated treatment well    Behavior During Therapy  [facility] for tasks assessed/performed       Past Medical History:  Diagnosis Date  . Adrenal cortical adenoma of left adrenal gland    stable since 2009;  MRI in 04/2010 - no further scans  . CAD (coronary artery disease)    a. s/p NSTEMI in 2001 tx with BMS to RCA  . Colon polyps   . HLD (hyperlipidemia)   . Hx of cardiovascular stress test    Lexiscan Myoview (09/2013):  No ischemia, EF 77%, Low Risk.  Marland Kitchen Hyperglycemia   . Hypertension   . Myoclonus    on neurontin per neuro at [facility]  . Nephrolithiasis   . Nodule of right lung    a. 51m on CXR 10/2009;  CT 05/2010 ok - no further scans  . Tobacco abuse     Past Surgical History:  Procedure Laterality Date  . CHOLECYSTECTOMY    . CORONARY ANGIOPLASTY WITH STENT PLACEMENT    . TOTAL ABDOMINAL HYSTERECTOMY      There were no vitals filed for this visit.  Subjective Assessment - 05/08/18 0937    Subjective  Pt denied falls or changes since last visit.     Patient is accompained by:  Family member   [name] husband   Pertinent History  h/o transverse myelitis;  (husband reports Rt sided weakness is due to pt having had lesion on spine due to shingles  approx. 3 yrs ago):  CAD; HTN    Patient Stated Goals  "get up and walk some place"    Currently in Pain?  No/denies                       [facility] 09[facility]      Transfers   Transfers  Sit to Stand;Stand to Sit    Sit to Stand  5: Supervision    Comments  STS txfs with RW, cues and demo for technique, hand placement, and safety (using RW at all times).Cues to improve weight shifting, x3 reps.       Ambulation/Gait   Ambulation/Gait  Yes    Ambulation/Gait Assistance  5: Supervision    Ambulation/Gait Assistance Details  Cues to improve B heel strike, R knee control, and to stay within RW.  Seated rest break after each bout of amb. 2/2 fatigue.  Cues for improved weight shifting over inclines/declines.    Ambulation Distance (Feet)  230 Feet   RW, 130' c rollator, 500' RW outdoors   AAcademic librarian  Gait Pattern  Decreased step length - right;Decreased hip/knee flexion - right;Decreased dorsiflexion - right;Decreased weight shift to right;Right genu recurvatum;Lateral trunk lean to right;Decreased dorsiflexion - left    Ambulation Surface  Level;Indoor    Pre-Gait Activities  NMR: At counter with 1 UE support and min guard to min A for balance: stepping over 2" beam to improve hip protraction, stance control and weight shifting during gait, x10 reps/LE.              PT Education - 05/08/18 1014    Education Details  PT reiterated the importance of using RW at all times and that pt is not safe to use rollator. OT informed PT that pt is furniture walking at home.     Person(s) Educated  Patient;Spouse    Methods  Explanation    Comprehension  Verbalized understanding       PT Short Term Goals - 04/24/18 0935      PT SHORT TERM GOAL #1   Title  Improve Berg score from 26/56 to >/= 31/56 to reduce fall risk.    Baseline  9/20: 43/56     Time  4    Period  Weeks    Status  Achieved      PT SHORT TERM  GOAL #2   Title  Improve TUG score from 18.09 secs without device to </= 15 secs without device for improved functional mobility and reduced fall risk.    Baseline  9/17: 16.72 secs without AD/AFO donned. Improved.     Time  4    Period  Weeks    Status  Not Met      PT SHORT TERM GOAL #3   Title  Pt will amb. 250' with RW with CGA on flat, even surface for incr. community accessibility.      Baseline  9/17: Pt amb. 250' with RW, Supervision on even surfaces.     Time  4    Period  Weeks    Status  Achieved      PT SHORT TERM GOAL #4   Title  Increase gait velocity from 1.53 ft/sec to >/= 1.9 ft/sec without device for incr. gait efficiency.    Baseline  9/17: 17.44secs = 1.24f./sec with no AD/ AFO donned; Improved.    Time  4    Period  Weeks    Status  Not Met      PT SHORT TERM GOAL #5   Title  Independent in HEP for RLE strengthening and balance exercises.    Baseline  9/20: Pt states she is independent with HEP.    Time  4    Period  Weeks    Status  Achieved        PT Long Term Goals - 04/24/18 0935      PT LONG TERM GOAL #1   Title  Pt will increase Berg score from 26/56 to >/= 36/56 to reduce fall risk.    Baseline  26/56 on 03-22-18    Time  8    Period  Weeks    Status  New      PT LONG TERM GOAL #2   Title  AFO consult for RLE will be obtained - and AFO for RLE obtained if determined to be needed.     Time  8    Period  Weeks    Status  New      PT LONG TERM GOAL #3   Title  Pt will  amb. 500' with RW with supervision on flat, even surface for incr. community accessibility.    Time  8    Period  Weeks    Status  New      PT LONG TERM GOAL #4   Title  Improve TUG score from 18.09 secs to </= 13.5 secs without device to reduce fall risk.    Baseline  18.09 secs on 03-22-18    Time  8    Period  Weeks    Status  New      PT LONG TERM GOAL #5   Title  Increase gait velocity from 1.53 ft/sec to >/= 2.2 ft/sec without device for incr. gait efficiency.      Baseline  21.47 secs = 1.53 ft/sec with no device on 03-22-18    Time  8    Period  Weeks    Status  New            Plan - 05/08/18 1132    Clinical Impression Statement  Pt demonstrated improvement in gait deviations and safety during amb. with RW vs. rollator. Pt continues to experience decr. R knee control in midstance and decr. hip protraction in stance phase of gait. Therefore, PT had pt perform stepping over beam to improve heel strike, hip protraction and controlled knee ext. in stance. Pt required tactile/verbal cues and min guard to min A to maintain proper technique and balance. Pt would continue to benefit from skilled PT to improve safety during functional mobility.     Rehab Potential  Good    Clinical Impairments Affecting Rehab Potential  chronicity of deficits - approx. 3 yrs since onset (due to transverse myelitis?)    PT Frequency  2x / week    PT Duration  8 weeks    PT Treatment/Interventions  ADLs/Self Care Home Management;DME Instruction;Gait training;Stair training;Orthotic Fit/Training;Patient/family education;Neuromuscular re-education;Balance training;Therapeutic exercise;Therapeutic activities;Passive range of motion    PT Next Visit Plan  STS txfs with proper use of RW, emphasize right LE strengthening for improved knee control with gait, balance reactions with decr UE support    PT Home Exercise Plan  see above    Consulted and Agree with Plan of Care  Patient;Family member/caregiver    Family Member Consulted  spouse - [name]       Patient will benefit from skilled therapeutic intervention in order to improve the following deficits and impairments:  Abnormal gait, Decreased activity tolerance, Decreased balance, Decreased coordination, Decreased strength, Impaired flexibility, Impaired tone, Impaired UE functional use, Pain  Visit Diagnosis: Other abnormalities of gait and mobility  Muscle weakness (generalized)  Unsteadiness on feet  Other lack of  coordination     Problem List Patient Active Problem List   Diagnosis Date Noted  . CAD (coronary artery disease) 09/04/2013  . HTN (hypertension) 09/04/2013  . HLD (hyperlipidemia) 09/04/2013  . Tobacco abuse 09/04/2013    , L 05/08/2018, 11:38 AM  [facility] [address] [place], [place], [address] Phone: [phone]   Fax:  [phone]  Name: JUSTUS DUERR MRN: [id number] Date of Birth: 04-05-1934  Geoffry Paradise, PT,DPT 05/08/18 11:40 AM Phone: [phone] Fax: [phone]

## 2018-05-08 NOTE — Therapy (Signed)
[facility] [address] [place], [place], [address] Phone: [phone]   Fax:  [phone]  Occupational Therapy Treatment  Patient Details  Name: Kim Sutton MRN: [id number] Date of Birth: [birth year] No data recorded  Encounter Date: 05/08/2018  OT End of Session - 05/08/18 1036    Visit Number  2    Number of Visits  17    Date for OT Re-Evaluation  06/25/18    Authorization Type  cert. period  04/26/18-07/24/18    Authorization Time Period  Medicare / [place] - Visit Number  2    Authorization - Number of Visits  10    OT Start Time  [phone]    OT Stop Time  0930    OT Time Calculation (min)  39 min    Activity Tolerance  Patient tolerated treatment well    Behavior During Therapy  WFL for tasks assessed/performed       Past Medical History:  Diagnosis Date  . Adrenal cortical adenoma of left adrenal gland    stable since 2009;  MRI in 04/2010 - no further scans  . CAD (coronary artery disease)    a. s/p NSTEMI in 2001 tx with BMS to RCA  . Colon polyps   . HLD (hyperlipidemia)   . Hx of cardiovascular stress test    Lexiscan Myoview (09/2013):  No ischemia, EF 77%, Low Risk.  Marland Kitchen Hyperglycemia   . Hypertension   . Myoclonus    on neurontin per neuro at [facility]  . Nephrolithiasis   . Nodule of right lung    a. 45mm on CXR 10/2009;  CT 05/2010 ok - no further scans  . Tobacco abuse     Past Surgical History:  Procedure Laterality Date  . CHOLECYSTECTOMY    . CORONARY ANGIOPLASTY WITH STENT PLACEMENT    . TOTAL ABDOMINAL HYSTERECTOMY      There were no vitals filed for this visit.  Subjective Assessment - 05/08/18 1035    Subjective   Pt reports achiness    Pertinent History  PMH:  transverse myelitis, hx of shingles per husband; CAD; HTN, HLD, hard of hearing    Patient Stated Goals  improve ability to pick up things, use RUE more for cleaning, strengthening    Currently in Pain?  Yes     Pain Score  3     Pain Location  Shoulder    Pain Orientation  Right    Pain Descriptors / Indicators  Aching    Pain Type  Acute pain    Pain Onset  More than a month ago    Pain Frequency  Intermittent    Aggravating Factors   malpositioning with shoulder flexion    Pain Relieving Factors  repositioning                 Treatment: Supine, pt performed shoulder depression, scapular retraction with mod v.c/ facilitation, AA/ROM right shoulder flexion with therapist facilitating proper shoulder/ scapular positioning, followed by closed chain shoulder flexion, mod facilitation at right shoulder. Seated weightbearing through tilted stool for low range shoulder flexion, min-mod v.c for upright posture and positioning. Mirror placed in front of patient for visual cue as pt. does not have a good understanding of midline and she leans head to left side, requiring v.c for upright posture/ positioning. Pt/ husband were instructed in basic coordination HEP, min-mod v.c for positioning see pt instructions. Shelf liner used with coin  task for increased ease with picking up coins.           OT Education - 05/08/18 1040    Education Details  basic coordination HEP, see pt instructions    Person(s) Educated  Patient;Spouse    Methods  Explanation;Demonstration;Verbal cues;Handout    Comprehension  Verbalized understanding;Returned demonstration;Verbal cues required       OT Short Term Goals - 05/08/18 1053      OT SHORT TERM GOAL #1   Title  Pt will be independent with coordination, RUE strength HEP.--check STGs 05/25/18    Time  4    Period  Weeks    Status  On-going      OT SHORT TERM GOAL #2   Title  Pt will improve RUE functional use for eating as shown by improving PPT#2 by at least 5sec.    Baseline  23.34sec    Time  4    Period  Weeks    Status  On-going      OT SHORT TERM GOAL #3   Title  Pt will improve coordination for ADLs as shown by improving time on 9-hole peg  test by at least 10sec.    Baseline  59.38sec    Time  4    Period  Weeks    Status  On-going      OT SHORT TERM GOAL #4   Title  Pt will improve R grip strength for opening containers/lifting objects by at least 5lbs.    Baseline  12lbs    Time  4    Period  Weeks    Status  On-going      OT SHORT TERM GOAL #5   Title  Pt will be able to demo at least 85* R shoulder flex for functional reaching.    Baseline  75*    Time  8    Period  Weeks    Status  On-going        OT Long Term Goals - 05/08/18 1053      OT LONG TERM GOAL #1   Title  Pt will be able to cut food mod I using AE prn.    Time  8    Period  Weeks    Status  On-going      OT LONG TERM GOAL #2   Title  Pt will be able to use RUE to eat with at least 50% of meal.    Time  8    Period  Weeks    Status  On-going      OT LONG TERM GOAL #3   Title  Pt will improve RUE functional reaching/coordination for ADLs as shown by improving score on box and blocks test by at least 8.    Baseline  22    Time  8    Period  Weeks    Status  On-going      OT LONG TERM GOAL #4   Title  Pt will improve R grip strength for opening containers/lifting objects by at least 10lbs.    Baseline  12lbs    Period  Weeks    Status  On-going      OT LONG TERM GOAL #5   Title  Pt will be able to use RUE for cleaning/home maintenance tasks for at least 25min prior to rest break.    Time  8    Period  Weeks    Status  On-going  OT LONG TERM GOAL #6   Title  Pt will be able to demo at least 95* R shoulder flex for functional reaching.    Baseline  75*    Time  8    Period  Weeks    Status  On-going            Plan - 05/08/18 1037    Clinical Impression Statement  Pt is progressing towards goals. Pt./ husband verbalize understanding of inital HEP. Pt can benefit from reinforcement of proper alignment/ positioning to minimize shoulder pain, and compensation.    Occupational Profile and client history currently  impacting functional performance  Pt is now limited in IADL performance, particularly when using dominant RUE.  Pt has to switch to nondominant LUE during ADLs/IADLs.      Occupational performance deficits (Please refer to evaluation for details):  ADL's;IADL's;Leisure;Social Participation    Rehab Potential  Good    OT Frequency  2x / week    OT Duration  8 weeks    OT Treatment/Interventions  Self-care/ADL training;Therapeutic exercise;Patient/family education;Neuromuscular education;Moist Heat;Fluidtherapy;Energy conservation;Therapist, nutritional;Therapeutic activities;Balance training;Passive range of motion;Manual Therapy;DME and/or AE instruction;Cryotherapy    Plan  initiate supine HEP for cane/ ball as able, consider putty    Consulted and Agree with Plan of Care  Patient;Family member/caregiver    Family Member Consulted  husband       Patient will benefit from skilled therapeutic intervention in order to improve the following deficits and impairments:  Decreased knowledge of use of DME, Decreased coordination, Decreased mobility, Decreased strength, Decreased range of motion, Decreased endurance, Decreased activity tolerance, Decreased balance, Impaired UE functional use  Visit Diagnosis: Muscle weakness (generalized)  Other abnormalities of gait and mobility  Other lack of coordination    Problem List Patient Active Problem List   Diagnosis Date Noted  . CAD (coronary artery disease) 09/04/2013  . HTN (hypertension) 09/04/2013  . HLD (hyperlipidemia) 09/04/2013  . Tobacco abuse 09/04/2013    Tyvon Eggenberger 05/08/2018, 10:54 AM Theone Murdoch, OTR/L [phone] Phone: [phone] 10:55 AM 05/08/18 [facility] [address] [place], [place], [address] Phone: [phone]   Fax:  [phone]  Name: Kim Sutton MRN: [id number] Date of Birth: [birth year]

## 2018-05-08 NOTE — Patient Instructions (Signed)
  Coordination Activities  Perform the following activities for 20 minutes 1 times per day with right hand(s).   Flip cards 1 at a time   Deal cards with your thumb (Hold deck in hand and push card off top with thumb).  Pick up coins and place in container or coin bank.  Be aware of head and shoulder positioning, do no lean to left side, avoid elevating shoulder.

## 2018-05-09 ENCOUNTER — Encounter: Payer: Self-pay | Admitting: Occupational Therapy

## 2018-05-09 ENCOUNTER — Ambulatory Visit: Payer: Medicare Other | Admitting: Occupational Therapy

## 2018-05-09 DIAGNOSIS — M6281 Muscle weakness (generalized): Secondary | ICD-10-CM | POA: Diagnosis not present

## 2018-05-09 DIAGNOSIS — R278 Other lack of coordination: Secondary | ICD-10-CM

## 2018-05-09 DIAGNOSIS — R2689 Other abnormalities of gait and mobility: Secondary | ICD-10-CM

## 2018-05-09 DIAGNOSIS — R2681 Unsteadiness on feet: Secondary | ICD-10-CM | POA: Diagnosis not present

## 2018-05-09 NOTE — Patient Instructions (Signed)
   1.  Sit in front of a mirror.  Try to position your head straight.  Squeeze your shoulder blades together.  Hold 10sec.  Repeat 5x, 3 times/day.  2.  Sit at table.  Bring empty coffee cup to your mouth with right hand, using left hand under cup to support.  Try to keep your shoulder down and just bend your elbow.  5x, 3x/day

## 2018-05-09 NOTE — Therapy (Signed)
[facility] [address] [place], [place], [address] Phone: [phone]   Fax:  [phone]  Occupational Therapy Treatment  Patient Details  Name: Kim Sutton MRN: [id number] Date of Birth: 12/01/1933 No data recorded  Encounter Date: 05/09/2018  OT End of Session - 05/09/18 1111    Visit Number  3    Number of Visits  17    Date for OT Re-Evaluation  06/25/18    Authorization Type  cert. period  04/26/18-07/24/18    Authorization Time Period  Medicare / [place] - Visit Number  3    Authorization - Number of Visits  10    OT Start Time  1020    OT Stop Time  1105    OT Time Calculation (min)  45 min    Activity Tolerance  Patient tolerated treatment well    Behavior During Therapy  WFL for tasks assessed/performed       Past Medical History:  Diagnosis Date  . Adrenal cortical adenoma of left adrenal gland    stable since 2009;  MRI in 04/2010 - no further scans  . CAD (coronary artery disease)    a. s/p NSTEMI in 2001 tx with BMS to RCA  . Colon polyps   . HLD (hyperlipidemia)   . Hx of cardiovascular stress test    Lexiscan Myoview (09/2013):  No ischemia, EF 77%, Low Risk.  Marland Kitchen Hyperglycemia   . Hypertension   . Myoclonus    on neurontin per neuro at [facility]  . Nephrolithiasis   . Nodule of right lung    a. 52mm on CXR 10/2009;  CT 05/2010 ok - no further scans  . Tobacco abuse     Past Surgical History:  Procedure Laterality Date  . CHOLECYSTECTOMY    . CORONARY ANGIOPLASTY WITH STENT PLACEMENT    . TOTAL ABDOMINAL HYSTERECTOMY      There were no vitals filed for this visit.  Subjective Assessment - 05/09/18 1023    Subjective   Pt reports soreness in R shoulder and neck that incr during the day    Pertinent History  PMH:  transverse myelitis, hx of shingles per husband; CAD; HTN, HLD, hard of hearing    Limitations  fall risk, hard of hearing    Patient Stated Goals  improve ability  to pick up things, use RUE more for cleaning, strengthening    Currently in Pain?  Yes    Pain Score  3     Pain Location  Shoulder    Pain Orientation  Right    Pain Descriptors / Indicators  Aching;Sharp    Pain Type  Chronic pain    Pain Onset  More than a month ago    Pain Frequency  Intermittent    Aggravating Factors   malpositioning     Pain Relieving Factors  proper positioning, muscle rub          Neuro re-ed:  In supine, BUEs closed-chain/AAROM shoulder flex and chest press with ball with min-mod facilitation at R shoulder for normal movement patterns.  In sitting, Scapular retraction with shoulder ER as able and focus/mod cueing (tactile, verbal, mirror) for midline trunk/head alignment.  Scapular retraction/chest press with ball using BUEs with mod cueing for compensation.  In sitting, bringing coffee cup to mouth with RUE and LUE for support/guidence with mod cueing for decr compensation patterns, but improved with repetition/cueing.    Education provided regarding how  improper positioning can contribute to pain and decr RUE functional use including posture and midline trunk alignment and even wt. Distribution through LEs.      OT Education - 05/09/18 1110    Education Details  Added to HEP/recommendations for home for focus on midline alignment and positioning of R shoulder with movement to help decr pain    Person(s) Educated  Patient;Spouse    Methods  Explanation;Demonstration;Verbal cues;Handout;Tactile cues    Comprehension  Verbalized understanding;Returned demonstration;Verbal cues required;Need further instruction       OT Short Term Goals - 05/08/18 1053      OT SHORT TERM GOAL #1   Title  Pt will be independent with coordination, RUE strength HEP.--check STGs 05/25/18    Time  4    Period  Weeks    Status  On-going      OT SHORT TERM GOAL #2   Title  Pt will improve RUE functional use for eating as shown by improving PPT#2 by at least 5sec.     Baseline  23.34sec    Time  4    Period  Weeks    Status  On-going      OT SHORT TERM GOAL #3   Title  Pt will improve coordination for ADLs as shown by improving time on 9-hole peg test by at least 10sec.    Baseline  59.38sec    Time  4    Period  Weeks    Status  On-going      OT SHORT TERM GOAL #4   Title  Pt will improve R grip strength for opening containers/lifting objects by at least 5lbs.    Baseline  12lbs    Time  4    Period  Weeks    Status  On-going      OT SHORT TERM GOAL #5   Title  Pt will be able to demo at least 85* R shoulder flex for functional reaching.    Baseline  75*    Time  8    Period  Weeks    Status  On-going        OT Long Term Goals - 05/08/18 1053      OT LONG TERM GOAL #1   Title  Pt will be able to cut food mod I using AE prn.    Time  8    Period  Weeks    Status  On-going      OT LONG TERM GOAL #2   Title  Pt will be able to use RUE to eat with at least 50% of meal.    Time  8    Period  Weeks    Status  On-going      OT LONG TERM GOAL #3   Title  Pt will improve RUE functional reaching/coordination for ADLs as shown by improving score on box and blocks test by at least 8.    Baseline  22    Time  8    Period  Weeks    Status  On-going      OT LONG TERM GOAL #4   Title  Pt will improve R grip strength for opening containers/lifting objects by at least 10lbs.    Baseline  12lbs    Period  Weeks    Status  On-going      OT LONG TERM GOAL #5   Title  Pt will be able to use RUE for cleaning/home maintenance tasks for at least  13min prior to rest break.    Time  8    Period  Weeks    Status  On-going      OT LONG TERM GOAL #6   Title  Pt will be able to demo at least 95* R shoulder flex for functional reaching.    Baseline  75*    Time  8    Period  Weeks    Status  On-going            Plan - 05/09/18 1111    Clinical Impression Statement  Pt is progressing towards goals.  Pt demo significant compensations  patterns and malalignment of head/trunk which may be contributing to pain.  Provided pt with education/activities to address this at home, but will need reinforcement.    Occupational Profile and client history currently impacting functional performance  Pt is now limited in IADL performance, particularly when using dominant RUE.  Pt has to switch to nondominant LUE during ADLs/IADLs.      Occupational performance deficits (Please refer to evaluation for details):  ADL's;IADL's;Leisure;Social Participation    Rehab Potential  Good    OT Frequency  2x / week    OT Duration  8 weeks    OT Treatment/Interventions  Self-care/ADL training;Therapeutic exercise;Patient/family education;Neuromuscular education;Moist Heat;Fluidtherapy;Energy conservation;Therapist, nutritional;Therapeutic activities;Balance training;Passive range of motion;Manual Therapy;DME and/or AE instruction;Cryotherapy    Plan  initiate supine HEP for cane/ ball as able, consider putty; reinforce proper midline alignment of head/trunk and decr R shoulder compensation patterns    Consulted and Agree with Plan of Care  Patient;Family member/caregiver    Family Member Consulted  husband       Patient will benefit from skilled therapeutic intervention in order to improve the following deficits and impairments:  Decreased knowledge of use of DME, Decreased coordination, Decreased mobility, Decreased strength, Decreased range of motion, Decreased endurance, Decreased activity tolerance, Decreased balance, Impaired UE functional use  Visit Diagnosis: Muscle weakness (generalized)  Other lack of coordination  Other abnormalities of gait and mobility  Unsteadiness on feet    Problem List Patient Active Problem List   Diagnosis Date Noted  . CAD (coronary artery disease) 09/04/2013  . HTN (hypertension) 09/04/2013  . HLD (hyperlipidemia) 09/04/2013  . Tobacco abuse 09/04/2013    [facility] 05/09/2018, 11:14 AM  [facility] [address] [place], [place], [address] Phone: [phone]   Fax:  [phone]  Name: Kim Sutton MRN: [id number] Date of Birth: 1934-06-03   Vianne Bulls, OTR/L [facility] [address] [place], [place]  [address] [phone] phone [phone] 05/09/18 11:14 AM

## 2018-05-10 ENCOUNTER — Encounter: Payer: Self-pay | Admitting: Physical Therapy

## 2018-05-10 ENCOUNTER — Ambulatory Visit: Payer: Medicare Other | Admitting: Physical Therapy

## 2018-05-10 DIAGNOSIS — R278 Other lack of coordination: Secondary | ICD-10-CM

## 2018-05-10 DIAGNOSIS — R2689 Other abnormalities of gait and mobility: Secondary | ICD-10-CM | POA: Diagnosis not present

## 2018-05-10 DIAGNOSIS — R2681 Unsteadiness on feet: Secondary | ICD-10-CM | POA: Diagnosis not present

## 2018-05-10 DIAGNOSIS — M6281 Muscle weakness (generalized): Secondary | ICD-10-CM

## 2018-05-10 NOTE — Therapy (Signed)
[facility] [address] [place], [place], [address] Phone: [phone]   Fax:  [phone]  Physical Therapy Treatment  Patient Details  Name: Kim Sutton MRN: [id number] Date of Birth: June 08, 1934 Referring Provider (PT): Dr. Lajean Manes   Encounter Date: 05/10/2018  PT End of Session - 05/10/18 1228    Visit Number  13    Number of Visits  17    Date for PT Re-Evaluation  05/22/18    Authorization Type  Medicare and [employer]ARP    Authorization Time Period  03-22-18 - 06-20-18    PT Start Time  1015    PT Stop Time  1100    PT Time Calculation (min)  45 min    Activity Tolerance  Patient tolerated treatment well    Behavior During Therapy  [facility] for tasks assessed/performed       Past Medical History:  Diagnosis Date  . Adrenal cortical adenoma of left adrenal gland    stable since 2009;  MRI in 04/2010 - no further scans  . CAD (coronary artery disease)    a. s/p NSTEMI in 2001 tx with BMS to RCA  . Colon polyps   . HLD (hyperlipidemia)   . Hx of cardiovascular stress test    Lexiscan Myoview (09/2013):  No ischemia, EF 77%, Low Risk.  Marland Kitchen Hyperglycemia   . Hypertension   . Myoclonus    on neurontin per neuro at [facility]  . Nephrolithiasis   . Nodule of right lung    a. 7m on CXR 10/2009;  CT 05/2010 ok - no further scans  . Tobacco abuse     Past Surgical History:  Procedure Laterality Date  . CHOLECYSTECTOMY    . CORONARY ANGIOPLASTY WITH STENT PLACEMENT    . TOTAL ABDOMINAL HYSTERECTOMY      There were no vitals filed for this visit.  Subjective Assessment - 05/10/18 1017    Subjective  Pt reports everything has been going well. Denies experiencing any falls. Reports she is still waiting for her R LE brace.     Patient is accompained by:  Family member   NMerrilee Sutton husband   Pertinent History  h/o transverse myelitis;  (husband reports Rt sided weakness is due to pt having had lesion on spine due to  shingles approx. 3 yrs ago):  CAD; HTN    Patient Stated Goals  "get up and walk some place"    Currently in Pain?  No/denies                       [facility] - 05/10/18 1217      Transfers   Transfers  Sit to Stand;Stand to Sit    Sit to Stand  5: Supervision    Stand to Sit  5: Supervision      Ambulation/Gait   Ambulation/Gait  Yes    Ambulation/Gait Assistance  5: Supervision    Ambulation/Gait Assistance Details  Pt performs x2 ambulation trials with her RW with resistance bands of different colors tied to her walker to provide visual cues for proper step length. Pt perform initial trial demonstrating observable improvement in step length bilaterally. Pt required seated rest break s/p initial trial 2/2 R foot drag and fatigue. S/p rest break patient ambulates an additional 115 feet with significant improvement in step length with visual feed back    Ambulation Distance (Feet)  115 Feet   x2 trials  Assistive device  Rolling walker    Gait Pattern  Decreased hip/knee flexion - right;Decreased dorsiflexion - right;Decreased weight shift to right;Right genu recurvatum;Lateral trunk lean to right;Decreased dorsiflexion - left    Ambulation Surface  Level;Indoor      Exercises   Exercises  Knee/Hip;Ankle      Knee/Hip Exercises: Standing   Heel Raises  Both;2 sets;10 reps    Heel Raises Limitations  Therapist attempted to initiated single LE heel raises; however, patient demonstrates significant reliance on UE's in // bars when attempting to raise & lower in SLS.     Terminal Knee Extension  Right;Strengthening;2 sets;10 reps;Theraband    Theraband Level (Terminal Knee Extension)  Level 3 (Green)    Step Down  1 set;Right    Step Down Limitations  Pt performs x10 reps of eccentric quad with R LE placed on 2" step while performing eccentric lower to tap L LE heel to ground. Pt attempts to perform using 4 inch step; however, relies heavily on UE  assistance and demonstrates increased R LE knee hyperextension with dec control      Ankle Exercises: Seated   Other Seated Ankle Exercises  Therapist initiates R plantar flexion strengthening exercise in closed chain with patient sitting with R LE off edge of mat and L LE foot placed on an uneven surface to increase R LE weightbearing. Pt then achieves stand from seated position relying on R LE gastroc to power up. Therapist provided min A to min guard 2/2 fatigue onset    2 sets/ 10 reps            PT Education - 05/10/18 1227    Education Details  Therapist provided education regarding increasing step length during ambulation trials with her RW and to perform hamstring/ gastroc stretches prior to bed to reduce risk of experiencing cramping.    Person(s) Educated  Patient;Spouse    Methods  Explanation    Comprehension  Verbalized understanding       PT Short Term Goals - 04/24/18 0935      PT SHORT TERM GOAL #1   Title  Improve Berg score from 26/56 to >/= 31/56 to reduce fall risk.    Baseline  9/20: 43/56     Time  4    Period  Weeks    Status  Achieved      PT SHORT TERM GOAL #2   Title  Improve TUG score from 18.09 secs without device to </= 15 secs without device for improved functional mobility and reduced fall risk.    Baseline  9/17: 16.72 secs without AD/AFO donned. Improved.     Time  4    Period  Weeks    Status  Not Met      PT SHORT TERM GOAL #3   Title  Pt will amb. 250' with RW with CGA on flat, even surface for incr. community accessibility.      Baseline  9/17: Pt amb. 250' with RW, Supervision on even surfaces.     Time  4    Period  Weeks    Status  Achieved      PT SHORT TERM GOAL #4   Title  Increase gait velocity from 1.53 ft/sec to >/= 1.9 ft/sec without device for incr. gait efficiency.    Baseline  9/17: 17.44secs = 1.59f./sec with no AD/ AFO donned; Improved.    Time  4    Period  Weeks    Status  Not  Met      PT SHORT TERM GOAL #5    Title  Independent in HEP for RLE strengthening and balance exercises.    Baseline  9/20: Pt states she is independent with HEP.    Time  4    Period  Weeks    Status  Achieved        PT Long Term Goals - 04/24/18 0935      PT LONG TERM GOAL #1   Title  Pt will increase Berg score from 26/56 to >/= 36/56 to reduce fall risk.    Baseline  26/56 on 03-22-18    Time  8    Period  Weeks    Status  New      PT LONG TERM GOAL #2   Title  AFO consult for RLE will be obtained - and AFO for RLE obtained if determined to be needed.     Time  8    Period  Weeks    Status  New      PT LONG TERM GOAL #3   Title  Pt will amb. 500' with RW with supervision on flat, even surface for incr. community accessibility.    Time  8    Period  Weeks    Status  New      PT LONG TERM GOAL #4   Title  Improve TUG score from 18.09 secs to </= 13.5 secs without device to reduce fall risk.    Baseline  18.09 secs on 03-22-18    Time  8    Period  Weeks    Status  New      PT LONG TERM GOAL #5   Title  Increase gait velocity from 1.53 ft/sec to >/= 2.2 ft/sec without device for incr. gait efficiency.     Baseline  21.47 secs = 1.53 ft/sec with no device on 03-22-18    Time  8    Period  Weeks    Status  New            Plan - 05/10/18 1229    Clinical Impression Statement  Today's skilled session focused on LE strengthening with R motor control to prevent knee hyperextension, and ambulation trials to improve step length bilaterally to optimize gait sequencing. Patient tolerated session well and demonstrates observable R LE weakness demonstrated by heavy reliance on UE's during strengthening exercises in // bars during fatigue onset. Pt demonstrates observable improvement in step length bilaterally during ambulation trials with resistance bands utilized as Warden/ranger. Pt will continue to benefit from lower extremity strengthening, balance, and functional mobility to continue to progress towards  achieving her therapy goals.    Rehab Potential  Good    Clinical Impairments Affecting Rehab Potential  chronicity of deficits - approx. 3 yrs since onset (due to transverse myelitis?)    PT Frequency  2x / week    PT Duration  8 weeks    PT Treatment/Interventions  ADLs/Self Care Home Management;DME Instruction;Gait training;Stair training;Orthotic Fit/Training;Patient/family education;Neuromuscular re-education;Balance training;Therapeutic exercise;Therapeutic activities;Passive range of motion    PT Next Visit Plan  status of R AFO, STS txfs with proper use of RW, emphasize right LE strengthening for improved knee control with gait, balance reactions with decr UE support    PT Home Exercise Plan  see above    Consulted and Agree with Plan of Care  Patient;Family member/caregiver    Family Member Consulted  spouse - Kim Sutton  Patient will benefit from skilled therapeutic intervention in order to improve the following deficits and impairments:  Abnormal gait, Decreased activity tolerance, Decreased balance, Decreased coordination, Decreased strength, Impaired flexibility, Impaired tone, Impaired UE functional use, Pain  Visit Diagnosis: Muscle weakness (generalized)  Other lack of coordination  Other abnormalities of gait and mobility  Unsteadiness on feet     Problem List Patient Active Problem List   Diagnosis Date Noted  . CAD (coronary artery disease) 09/04/2013  . HTN (hypertension) 09/04/2013  . HLD (hyperlipidemia) 09/04/2013  . Tobacco abuse 09/04/2013    Floreen Comber, SPT 05/10/2018, 12:32 PM  [facility] [address] [place], [place], [address] Phone: [phone]   Fax:  [phone]  Name: Kim Sutton MRN: [id number] Date of Birth: July 07, 1934

## 2018-05-15 ENCOUNTER — Ambulatory Visit: Payer: Medicare Other | Admitting: Physical Therapy

## 2018-05-15 ENCOUNTER — Encounter: Payer: Self-pay | Admitting: Physical Therapy

## 2018-05-15 ENCOUNTER — Ambulatory Visit: Payer: Medicare Other | Admitting: Occupational Therapy

## 2018-05-15 DIAGNOSIS — R2681 Unsteadiness on feet: Secondary | ICD-10-CM | POA: Diagnosis not present

## 2018-05-15 DIAGNOSIS — R2689 Other abnormalities of gait and mobility: Secondary | ICD-10-CM

## 2018-05-15 DIAGNOSIS — R278 Other lack of coordination: Secondary | ICD-10-CM

## 2018-05-15 DIAGNOSIS — M6281 Muscle weakness (generalized): Secondary | ICD-10-CM

## 2018-05-15 NOTE — Patient Instructions (Signed)
1. Grip Strengthening (Resistive Putty)   Squeeze putty using thumb and all fingers. Repeat _20___ times. Do __2__ sessions per day.   2. Roll putty into tube on table and pinch between each finger and thumb x 10 reps each. (can do ring and small finger together)     Copyright  VHI. All rights reserved.   

## 2018-05-15 NOTE — Therapy (Signed)
[facility] [address] [place], [place], [address] Phone: [phone]   Fax:  [phone]  Physical Therapy Treatment  Patient Details  Name: Kim Sutton MRN: [id number] Date of Birth: 20-Dec-1933 Referring Provider (PT): Dr. Lajean Sutton   Encounter Date: 05/15/2018  PT End of Session - 05/15/18 0945    Visit Number  14    Number of Visits  17    Date for PT Re-Evaluation  05/22/18    Authorization Type  Medicare and AARP    Authorization Time Period  03-22-18 - 06-20-18    PT Start Time  0850    PT Stop Time  0930    PT Time Calculation (min)  40 min    Activity Tolerance  Patient tolerated treatment well    Behavior During Therapy  [facility] for tasks assessed/performed       Past Medical History:  Diagnosis Date  . Adrenal cortical adenoma of left adrenal gland    stable since 2009;  MRI in 04/2010 - no further scans  . CAD (coronary artery disease)    a. s/p NSTEMI in 2001 tx with BMS to RCA  . Colon polyps   . HLD (hyperlipidemia)   . Hx of cardiovascular stress test    Lexiscan Myoview (09/2013):  No ischemia, EF 77%, Low Risk.  Marland Kitchen Hyperglycemia   . Hypertension   . Myoclonus    on neurontin per neuro at [facility]  . Nephrolithiasis   . Nodule of right lung    a. 94m on CXR 10/2009;  CT 05/2010 ok - no further scans  . Tobacco abuse     Past Surgical History:  Procedure Laterality Date  . CHOLECYSTECTOMY    . CORONARY ANGIOPLASTY WITH STENT PLACEMENT    . TOTAL ABDOMINAL HYSTERECTOMY      There were no vitals filed for this visit.  Subjective Assessment - 05/15/18 0850    Subjective  Pt reports everything has been going well with no new complaints. Reports she was able to perform 14 steps with reciprocal stepping technique with no issues.     Patient is accompained by:  Family member   NMerrilee Sutton husband   Pertinent History  h/o transverse myelitis;  (husband reports Rt sided weakness is due to pt having  had lesion on spine due to shingles approx. 3 yrs ago):  CAD; HTN    Patient Stated Goals  "get up and walk some place"    Currently in Pain?  No/denies                       [facility] - [facility] 0936      Transfers   Transfers  Sit to Stand;Stand to Sit    Sit to Stand  5: Supervision    Stand to Sit  5: Supervision      Ambulation/Gait   Ambulation/Gait  Yes    Ambulation/Gait Assistance  5: Supervision;4: Min guard   min guard for amb. with no AD   Ambulation/Gait Assistance Details  Pt performs x1 ambulation trial with her RW and resistance band utilized to provide visual feedback for proper step length. Pt demonstrates observable improvement in step length bilaterally. Pt then performs x1 ambulation trial with resistance band visual feedback removed. Pt demonstrates ability to maintain equal step length with forward gaze. Pt attempts x1 ambulation trial with no AD at end of session s/p LE strengthening exercises. Pt demonstrates reduced  heel strike to R LE during initial contact and forceful hyperextension during midstance 2/2 fatigue onset.     Ambulation Distance (Feet)  115 Feet   x3 trials of 115 feet    Assistive device  Rolling walker;None    Gait Pattern  Decreased hip/knee flexion - right;Decreased dorsiflexion - right;Decreased weight shift to right;Right genu recurvatum;Lateral trunk lean to right;Decreased dorsiflexion - left    Ambulation Surface  Level;Indoor      Therapeutic Activites    Therapeutic Activities  Other Therapeutic Activities    Other Therapeutic Activities  Pt performs step length activity while stance limb remains on beam with Pt advancing contralateral LE to visual target placed on floor. Pt performs 2x10 reps progressing to 2# wt added to R LE during advancement. Therapist provided manual facilitation to R knee to prevent hyperextension during stance.       Exercises   Exercises  Knee/Hip      Knee/Hip  Exercises: Standing   Other Standing Knee Exercises  Pt performs 1x10 reps on each LE performing the following exercises; standing hip flexion, abduction, and hip extension. Pt performs 3 way strengthening exercise with B UE assist and therapist providing multimodal cues for pelvis stabilization for proper technique. Pt performs with 2# ankle weight for increased challenge.     Other Standing Knee Exercises  Pt performs x2 trials of heel walking in // bars for UE assist. Pt demonstrates ability to initiate DF on R LE but unable to maintain during WB.              PT Education - 05/15/18 0944    Education Details  Therapist provided education regarding purpose of step length activity to assess carry over with ambulation trials and proper technique to for all LE strengthening exercises.     Person(s) Educated  Patient    Methods  Explanation    Comprehension  Verbalized understanding;Returned demonstration       PT Short Term Goals - 04/24/18 0935      PT SHORT TERM GOAL #1   Title  Improve Berg score from 26/56 to >/= 31/56 to reduce fall risk.    Baseline  9/20: 43/56     Time  4    Period  Weeks    Status  Achieved      PT SHORT TERM GOAL #2   Title  Improve TUG score from 18.09 secs without device to </= 15 secs without device for improved functional mobility and reduced fall risk.    Baseline  9/17: 16.72 secs without AD/AFO donned. Improved.     Time  4    Period  Weeks    Status  Not Met      PT SHORT TERM GOAL #3   Title  Pt will amb. 250' with RW with CGA on flat, even surface for incr. community accessibility.      Baseline  9/17: Pt amb. 250' with RW, Supervision on even surfaces.     Time  4    Period  Weeks    Status  Achieved      PT SHORT TERM GOAL #4   Title  Increase gait velocity from 1.53 ft/sec to >/= 1.9 ft/sec without device for incr. gait efficiency.    Baseline  9/17: 17.44secs = 1.55f./sec with no AD/ AFO donned; Improved.    Time  4    Period   Weeks    Status  Not Met      PT  SHORT TERM GOAL #5   Title  Independent in HEP for RLE strengthening and balance exercises.    Baseline  9/20: Pt states she is independent with HEP.    Time  4    Period  Weeks    Status  Achieved        PT Long Term Goals - 04/24/18 0935      PT LONG TERM GOAL #1   Title  Pt will increase Berg score from 26/56 to >/= 36/56 to reduce fall risk.    Baseline  26/56 on 03-22-18    Time  8    Period  Weeks    Status  New      PT LONG TERM GOAL #2   Title  AFO consult for RLE will be obtained - and AFO for RLE obtained if determined to be needed.     Time  8    Period  Weeks    Status  New      PT LONG TERM GOAL #3   Title  Pt will amb. 500' with RW with supervision on flat, even surface for incr. community accessibility.    Time  8    Period  Weeks    Status  New      PT LONG TERM GOAL #4   Title  Improve TUG score from 18.09 secs to </= 13.5 secs without device to reduce fall risk.    Baseline  18.09 secs on 03-22-18    Time  8    Period  Weeks    Status  New      PT LONG TERM GOAL #5   Title  Increase gait velocity from 1.53 ft/sec to >/= 2.2 ft/sec without device for incr. gait efficiency.     Baseline  21.47 secs = 1.53 ft/sec with no device on 03-22-18    Time  8    Period  Weeks    Status  New            Plan - 05/15/18 0945    Clinical Impression Statement  Today's skilled session focused on LE strengthening and improving step length bilaterally to assess carry over during subsequent ambulation trials. Pt tolerated session well and able to demonstrate good carry over performing strengthening exercises with proper technique s/p multimodal cueing. Pt continues to demonstrate poor R  LE knee control 2/2 fatigue onset requiring therapist to provide manual facilitation to prevent hyperextension. Pt demonstrates good carry over with step length during ambulation trials using her RW with and without visual feedback. Pt attempts 1  ambulation trial with no AD and therapist providing min guard; however, 2/2 fatigue patient demonstrates R knee hyperextension with poor control. Pt will continue to benefit from skilled PT to address balance, strength and functional mobility deficits.     Rehab Potential  Good    Clinical Impairments Affecting Rehab Potential  chronicity of deficits - approx. 3 yrs since onset (due to transverse myelitis?)    PT Frequency  2x / week    PT Duration  8 weeks    PT Treatment/Interventions  ADLs/Self Care Home Management;DME Instruction;Gait training;Stair training;Orthotic Fit/Training;Patient/family education;Neuromuscular re-education;Balance training;Therapeutic exercise;Therapeutic activities;Passive range of motion    PT Next Visit Plan  begin session with ambulation with no AD before fatigue onset, status of R AFO, STS txfs with proper use of RW, emphasize right LE strengthening for improved knee control with gait, balance reactions with decr UE support    PT Home Exercise  Plan  see above    Consulted and Agree with Plan of Care  Patient;Family member/caregiver    Family Member Consulted  spouse - Kim Sutton       Patient will benefit from skilled therapeutic intervention in order to improve the following deficits and impairments:  Abnormal gait, Decreased activity tolerance, Decreased balance, Decreased coordination, Decreased strength, Impaired flexibility, Impaired tone, Impaired UE functional use, Pain  Visit Diagnosis: Muscle weakness (generalized)  Other abnormalities of gait and mobility     Problem List Patient Active Problem List   Diagnosis Date Noted  . CAD (coronary artery disease) 09/04/2013  . HTN (hypertension) 09/04/2013  . HLD (hyperlipidemia) 09/04/2013  . Tobacco abuse 09/04/2013    Floreen Comber, SPT 05/15/2018, 9:50 AM  [facility] [address] [place], [place], [address] Phone: [phone]   Fax:   [phone]  Name: Kim Sutton MRN: [id number] Date of Birth: 11-Sep-1933

## 2018-05-15 NOTE — Therapy (Signed)
[facility] [address] [place], [place], [address] Phone: [phone]   Fax:  [phone]  Occupational Therapy Treatment  Patient Details  Name: Kim Sutton MRN: [id number] Date of Birth: 1934-01-09 No data recorded  Encounter Date: 05/15/2018  OT End of Session - 05/15/18 1257    Visit Number  4    Number of Visits  17    Date for OT Re-Evaluation  06/25/18    Authorization Type  cert. period  04/26/18-07/24/18    Authorization Time Period  Medicare / [employer]    Authorization - Visit Number  4    Authorization - Number of Visits  10    OT Start Time  0805    OT Stop Time  0845    OT Time Calculation (min)  40 min    Activity Tolerance  Patient tolerated treatment well    Behavior During Therapy  WFL for tasks assessed/performed       Past Medical History:  Diagnosis Date  . Adrenal cortical adenoma of left adrenal gland    stable since 2009;  MRI in 04/2010 - no further scans  . CAD (coronary artery disease)    a. s/p NSTEMI in 2001 tx with BMS to RCA  . Colon polyps   . HLD (hyperlipidemia)   . Hx of cardiovascular stress test    Lexiscan Myoview (09/2013):  No ischemia, EF 77%, Low Risk.  Marland Kitchen Hyperglycemia   . Hypertension   . Myoclonus    on neurontin per neuro at [facility]  . Nephrolithiasis   . Nodule of right lung    a. 76mm on CXR 10/2009;  CT 05/2010 ok - no further scans  . Tobacco abuse     Past Surgical History:  Procedure Laterality Date  . CHOLECYSTECTOMY    . CORONARY ANGIOPLASTY WITH STENT PLACEMENT    . TOTAL ABDOMINAL HYSTERECTOMY      There were no vitals filed for this visit.  Subjective Assessment - 05/15/18 1308    Subjective   Pt reports soreness in R shoulder and neck that incr during the day    Pertinent History  PMH:  transverse myelitis, hx of shingles per husband; CAD; HTN, HLD, hard of hearing    Patient Stated Goals  improve ability to pick up things, use RUE more for  cleaning, strengthening    Currently in Pain?  Yes    Pain Score  3     Pain Location  Shoulder    Pain Orientation  Right    Pain Descriptors / Indicators  Aching    Pain Type  Chronic pain    Pain Onset  More than a month ago    Pain Frequency  Intermittent    Aggravating Factors   malpositioning    Pain Relieving Factors  repositioning                  treatment: supine closed chain shoulder flexion and chest press with PVC pipe frame, min-mod facilitation Seated low range shoulder flexion, bilateral UE's with medium ball. Pt bumped her right ring finger on a nail at edge of mat, no broken skin evident, purplish area at nailbed. Pt's husband aware. Pt was no in distress. Standing at table to place various sized pegs into semicircle with RUE, min-mod v.c           OT Education - 05/15/18 1304    Education Details  yellow putty HEP  Person(s) Educated  Patient;Spouse    Methods  Explanation;Demonstration;Verbal cues;Handout;Tactile cues    Comprehension  Verbalized understanding;Returned demonstration;Verbal cues required       OT Short Term Goals - 05/08/18 1053      OT SHORT TERM GOAL #1   Title  Pt will be independent with coordination, RUE strength HEP.--check STGs 05/25/18    Time  4    Period  Weeks    Status  On-going      OT SHORT TERM GOAL #2   Title  Pt will improve RUE functional use for eating as shown by improving PPT#2 by at least 5sec.    Baseline  23.34sec    Time  4    Period  Weeks    Status  On-going      OT SHORT TERM GOAL #3   Title  Pt will improve coordination for ADLs as shown by improving time on 9-hole peg test by at least 10sec.    Baseline  59.38sec    Time  4    Period  Weeks    Status  On-going      OT SHORT TERM GOAL #4   Title  Pt will improve R grip strength for opening containers/lifting objects by at least 5lbs.    Baseline  12lbs    Time  4    Period  Weeks    Status  On-going      OT SHORT TERM GOAL #5    Title  Pt will be able to demo at least 85* R shoulder flex for functional reaching.    Baseline  75*    Time  8    Period  Weeks    Status  On-going        OT Long Term Goals - 05/08/18 1053      OT LONG TERM GOAL #1   Title  Pt will be able to cut food mod I using AE prn.    Time  8    Period  Weeks    Status  On-going      OT LONG TERM GOAL #2   Title  Pt will be able to use RUE to eat with at least 50% of meal.    Time  8    Period  Weeks    Status  On-going      OT LONG TERM GOAL #3   Title  Pt will improve RUE functional reaching/coordination for ADLs as shown by improving score on box and blocks test by at least 8.    Baseline  22    Time  8    Period  Weeks    Status  On-going      OT LONG TERM GOAL #4   Title  Pt will improve R grip strength for opening containers/lifting objects by at least 10lbs.    Baseline  12lbs    Period  Weeks    Status  On-going      OT LONG TERM GOAL #5   Title  Pt will be able to use RUE for cleaning/home maintenance tasks for at least 32min prior to rest break.    Time  8    Period  Weeks    Status  On-going      OT LONG TERM GOAL #6   Title  Pt will be able to demo at least 95* R shoulder flex for functional reaching.    Baseline  75*    Time  8  Period  Weeks    Status  On-going            Plan - 05/15/18 1301    Clinical Impression Statement  Pt is progressing towards goals.  Pt requires v.c for proper positioning to avoid compensation.    Occupational performance deficits (Please refer to evaluation for details):  ADL's;IADL's;Leisure;Social Participation    Rehab Potential  Good    OT Frequency  2x / week    OT Duration  8 weeks    OT Treatment/Interventions  Self-care/ADL training;Therapeutic exercise;Patient/family education;Neuromuscular education;Moist Heat;Fluidtherapy;Energy conservation;Therapist, nutritional;Therapeutic activities;Balance training;Passive range of motion;Manual Therapy;DME and/or  AE instruction;Cryotherapy    Plan  initiate supine HEP for cane/ ball,  reinforce proper midline alignment of head/trunk and decr R shoulder compensation patterns    OT Home Exercise Plan  putty    Consulted and Agree with Plan of Care  Patient;Family member/caregiver    Family Member Consulted  husband       Patient will benefit from skilled therapeutic intervention in order to improve the following deficits and impairments:  Decreased knowledge of use of DME, Decreased coordination, Decreased mobility, Decreased strength, Decreased range of motion, Decreased endurance, Decreased activity tolerance, Decreased balance, Impaired UE functional use  Visit Diagnosis: Muscle weakness (generalized)  Other abnormalities of gait and mobility  Other lack of coordination    Problem List Patient Active Problem List   Diagnosis Date Noted  . CAD (coronary artery disease) 09/04/2013  . HTN (hypertension) 09/04/2013  . HLD (hyperlipidemia) 09/04/2013  . Tobacco abuse 09/04/2013    RINE,KATHRYN 05/15/2018, 1:09 PM  [facility] [address] [place], [place], [address] Phone: [phone]   Fax:  [phone]  Name: Kim Sutton MRN: [id number] Date of Birth: 02-17-1934

## 2018-05-17 ENCOUNTER — Encounter: Payer: Self-pay | Admitting: Physical Therapy

## 2018-05-17 ENCOUNTER — Ambulatory Visit: Payer: Medicare Other | Admitting: Physical Therapy

## 2018-05-17 ENCOUNTER — Ambulatory Visit: Payer: Medicare Other | Admitting: Occupational Therapy

## 2018-05-17 ENCOUNTER — Encounter: Payer: Self-pay | Admitting: Occupational Therapy

## 2018-05-17 DIAGNOSIS — R2689 Other abnormalities of gait and mobility: Secondary | ICD-10-CM

## 2018-05-17 DIAGNOSIS — R2681 Unsteadiness on feet: Secondary | ICD-10-CM

## 2018-05-17 DIAGNOSIS — M6281 Muscle weakness (generalized): Secondary | ICD-10-CM

## 2018-05-17 DIAGNOSIS — R278 Other lack of coordination: Secondary | ICD-10-CM

## 2018-05-17 NOTE — Patient Instructions (Signed)
   Lay down.  Hold a ball/shoe box/pillow (shoulder width sized) on tops of legs with arms straight and hands flat on either side. Slowly move arms up/back in an arc with elbows straight and then slowly lower back down. Keep shoulders away from ears. Repeat 10 times.    Rest, then do 10 more, 2x/day      Lay down.  Hold a ball/shoe box/pillow (shoulder width sized)  with arms straight and hands flat on either side. Start with ball on chest, then straighten elbows to push ball up to the ceiling hold 3sec, then slowly lower by keeping elbows close to your side. Keep shoulders away from ears Repeat 10 times.  Rest, then do 10 more, 2x/day

## 2018-05-17 NOTE — Therapy (Signed)
[facility] [address] [place], [place], [address] Phone: [phone]   Fax:  [phone]  Occupational Therapy Treatment  Patient Details  Name: Kim Sutton MRN: [id number] Date of Birth: February 05, 1934 No data recorded  Encounter Date: 05/17/2018  OT End of Session - 05/17/18 1400    Visit Number  5    Number of Visits  17    Date for OT Re-Evaluation  06/25/18    Authorization Type  cert. period  04/26/18-07/24/18    Authorization Time Period  Medicare / [place] - Visit Number  5    Authorization - Number of Visits  10    OT Start Time  [phone]    OT Stop Time  [id number]    OT Time Calculation (min)  40 min    Activity Tolerance  Patient tolerated treatment well    Behavior During Therapy  [facility] for tasks assessed/performed       Past Medical History:  Diagnosis Date  . Adrenal cortical adenoma of left adrenal gland    stable since 2009;  MRI in 04/2010 - no further scans  . CAD (coronary artery disease)    a. s/p NSTEMI in 2001 tx with BMS to RCA  . Colon polyps   . HLD (hyperlipidemia)   . Hx of cardiovascular stress test    Lexiscan Myoview (09/2013):  No ischemia, EF 77%, Low Risk.  Marland Kitchen Hyperglycemia   . Hypertension   . Myoclonus    on neurontin per neuro at [facility]  . Nephrolithiasis   . Nodule of right lung    a. 30mm on CXR 10/2009;  CT 05/2010 ok - no further scans  . Tobacco abuse     Past Surgical History:  Procedure Laterality Date  . CHOLECYSTECTOMY    . CORONARY ANGIOPLASTY WITH STENT PLACEMENT    . TOTAL ABDOMINAL HYSTERECTOMY      There were no vitals filed for this visit.  Subjective Assessment - 05/17/18 1358    Subjective   Pt reports that she has been working on posture at home    Pertinent History  PMH:  transverse myelitis, hx of shingles per husband; CAD; HTN, HLD, hard of hearing    Patient Stated Goals  improve ability to pick up things, use RUE more for cleaning,  strengthening    Currently in Pain?  No/denies    Pain Onset  More than a month ago       Reviewed from HEP:  Scapular retraction with mirror and min-mod cueing for head alignment, bringing coffee cup to mouth with focus on normal movement patterns/decr compensation at R shoulder with mod cueing, flipping cards with focus on supination and decr shoulder hike and dealing cards with thumb.     OT Education - 05/17/18 1358    Education Details  supine ball HEP--see pt instructions    Person(s) Educated  Patient;Spouse    Methods  Explanation;Demonstration;Verbal cues;Handout;Tactile cues    Comprehension  Verbalized understanding;Returned demonstration;Verbal cues required       OT Short Term Goals - 05/08/18 1053      OT SHORT TERM GOAL #1   Title  Pt will be independent with coordination, RUE strength HEP.--check STGs 05/25/18    Time  4    Period  Weeks    Status  On-going      OT SHORT TERM GOAL #2   Title  Pt will improve RUE functional use for eating  as shown by improving PPT#2 by at least 5sec.    Baseline  23.34sec    Time  4    Period  Weeks    Status  On-going      OT SHORT TERM GOAL #3   Title  Pt will improve coordination for ADLs as shown by improving time on 9-hole peg test by at least 10sec.    Baseline  59.38sec    Time  4    Period  Weeks    Status  On-going      OT SHORT TERM GOAL #4   Title  Pt will improve R grip strength for opening containers/lifting objects by at least 5lbs.    Baseline  12lbs    Time  4    Period  Weeks    Status  On-going      OT SHORT TERM GOAL #5   Title  Pt will be able to demo at least 85* R shoulder flex for functional reaching.    Baseline  75*    Time  8    Period  Weeks    Status  On-going        OT Long Term Goals - 05/08/18 1053      OT LONG TERM GOAL #1   Title  Pt will be able to cut food mod I using AE prn.    Time  8    Period  Weeks    Status  On-going      OT LONG TERM GOAL #2   Title  Pt will be  able to use RUE to eat with at least 50% of meal.    Time  8    Period  Weeks    Status  On-going      OT LONG TERM GOAL #3   Title  Pt will improve RUE functional reaching/coordination for ADLs as shown by improving score on box and blocks test by at least 8.    Baseline  22    Time  8    Period  Weeks    Status  On-going      OT LONG TERM GOAL #4   Title  Pt will improve R grip strength for opening containers/lifting objects by at least 10lbs.    Baseline  12lbs    Period  Weeks    Status  On-going      OT LONG TERM GOAL #5   Title  Pt will be able to use RUE for cleaning/home maintenance tasks for at least 68min prior to rest break.    Time  8    Period  Weeks    Status  On-going      OT LONG TERM GOAL #6   Title  Pt will be able to demo at least 95* R shoulder flex for functional reaching.    Baseline  75*    Time  8    Period  Weeks    Status  On-going            Plan - 05/17/18 1400    Clinical Impression Statement  Pt is progressing towards goals.  Pt requires v.c for proper positioning to avoid compensation, but decr compensation now with cueing.    Occupational performance deficits (Please refer to evaluation for details):  ADL's;IADL's;Leisure;Social Participation    Rehab Potential  Good    OT Frequency  2x / week    OT Duration  8 weeks    OT Treatment/Interventions  Self-care/ADL  training;Therapeutic exercise;Patient/family education;Neuromuscular education;Moist Heat;Fluidtherapy;Energy conservation;Therapist, nutritional;Therapeutic activities;Balance training;Passive range of motion;Manual Therapy;DME and/or AE instruction;Cryotherapy    Plan  reinforce proper midline alignment of head/trunk and decr R shoulder compensation patterns, RUE functional use/neuro re-ed    OT Home Exercise Plan  putty    Consulted and Agree with Plan of Care  Patient;Family member/caregiver    Family Member Consulted  husband       Patient will benefit from skilled  therapeutic intervention in order to improve the following deficits and impairments:  Decreased knowledge of use of DME, Decreased coordination, Decreased mobility, Decreased strength, Decreased range of motion, Decreased endurance, Decreased activity tolerance, Decreased balance, Impaired UE functional use  Visit Diagnosis: Muscle weakness (generalized)  Other abnormalities of gait and mobility  Other lack of coordination  Unsteadiness on feet    Problem List Patient Active Problem List   Diagnosis Date Noted  . CAD (coronary artery disease) 09/04/2013  . HTN (hypertension) 09/04/2013  . HLD (hyperlipidemia) 09/04/2013  . Tobacco abuse 09/04/2013    [employer] 05/17/2018, 2:01 PM  [facility] [address] [place], [place], [address] Phone: [phone]   Fax:  [phone]  Name: Kim Sutton MRN: [id number] Date of Birth: 11-28-1933   Vianne Bulls, OTR/L [facility] [address] [place], [place]  [address] [phone] phone [phone] 05/17/18 2:04 PM

## 2018-05-20 NOTE — Therapy (Signed)
[facility] [address] [place], [place], [address] Phone: [phone]   Fax:  [phone]  Physical Therapy Treatment  Patient Details  Name: Kim Sutton MRN: [id number] Date of Birth: Dec 20, 1933 Referring Provider (PT): Dr. Lajean Manes   Encounter Date: 05/17/2018   05/17/18 0938  PT Visits / Re-Eval  Visit Number 15  Number of Visits 17  Date for PT Re-Evaluation 05/22/18  Authorization  Authorization Type Medicare and [employer]  Authorization Time Period 03-22-18 - 06-20-18  PT Time Calculation  PT Start Time 0935  PT Stop Time 1015  PT Time Calculation (min) 40 min  PT - End of Session  Equipment Utilized During Treatment Gait belt  Activity Tolerance Patient tolerated treatment well  Behavior During Therapy [facility] for tasks assessed/performed     Past Medical History:  Diagnosis Date  . Adrenal cortical adenoma of left adrenal gland    stable since 2009;  MRI in 04/2010 - no further scans  . CAD (coronary artery disease)    a. s/p NSTEMI in 2001 tx with BMS to RCA  . Colon polyps   . HLD (hyperlipidemia)   . Hx of cardiovascular stress test    Lexiscan Myoview (09/2013):  No ischemia, EF 77%, Low Risk.  Marland Kitchen Hyperglycemia   . Hypertension   . Myoclonus    on neurontin per neuro at [facility]  . Nephrolithiasis   . Nodule of right lung    a. 13m on CXR 10/2009;  CT 05/2010 ok - no further scans  . Tobacco abuse     Past Surgical History:  Procedure Laterality Date  . CHOLECYSTECTOMY    . CORONARY ANGIOPLASTY WITH STENT PLACEMENT    . TOTAL ABDOMINAL HYSTERECTOMY      There were no vitals filed for this visit.     05/17/18 0937  Symptoms/Limitations  Subjective No new complaitns. No falls or pain to report.   Patient is accompained by: Family member ([name] spouse)  Pertinent History h/o transverse myelitis;  (husband reports Rt sided weakness is due to pt having had lesion on spine due to shingles  approx. 3 yrs ago):  CAD; HTN  Patient Stated Goals "get up and walk some place"  Pain Assessment  Currently in Pain? No/denies  Pain Score 0      05/17/18 0940  Transfers  Transfers Sit to Stand;Stand to Sit  Sit to Stand 5: Supervision;With upper extremity assist;From bed;From chair/3-in-1  Stand to Sit 5: Supervision;With upper extremity assist;To bed;To chair/3-in-1  Ambulation/Gait  Ambulation/Gait Yes  Ambulation/Gait Assistance 5: Supervision  Ambulation/Gait Assistance Details cues to stay within walker/for posture with gait; increased right foot drag/drop noted with last 75-100 feet of gait.   Ambulation Distance (Feet) 530 Feet  Assistive device Rolling walker  Gait Pattern Decreased hip/knee flexion - right;Decreased dorsiflexion - right;Decreased weight shift to right;Right genu recurvatum;Lateral trunk lean to right;Poor foot clearance - right  Ambulation Surface Level;Indoor  Gait velocity 14.31 sec's= 2.30 ft/sec with RW/no AFO; 13.84 secs= 2.37 ft/sec no AD/no AFO with foot drop/genu recurvatum noted  Standardized Balance Assessment  Standardized Balance Assessment Berg Balance Test;TUG  Berg Balance Test  Sit to Stand 4  Standing Unsupported 4  Sitting with Back Unsupported but Feet Supported on Floor or Stool 4  Stand to Sit 4  Transfers 4  Standing Unsupported with Eyes Closed 4  Standing Ubsupported with Feet Together 3  From Standing, Reach Forward with Outstretched Arm 3 (6 inches)  From Standing Position, Pick up Object from Floor 3  From Standing Position, Turn to Look Behind Over each Shoulder 4  Turn 360 Degrees 2 (> 6 sec's )  Standing Unsupported, Alternately Place Feet on Step/Stool 3 (32.81 sec's)  Standing Unsupported, One Foot in Front 3  Standing on One Leg 2  Total Score 47  Timed Up and Go Test  TUG Normal TUG  Normal TUG (seconds) 13.35 (no AD/AFO, one episode of right toe catch w/ min assist  )         PT Short Term Goals -  04/24/18 0935      PT SHORT TERM GOAL #1   Title  Improve Berg score from 26/56 to >/= 31/56 to reduce fall risk.    Baseline  9/20: 43/56     Time  4    Period  Weeks    Status  Achieved      PT SHORT TERM GOAL #2   Title  Improve TUG score from 18.09 secs without device to </= 15 secs without device for improved functional mobility and reduced fall risk.    Baseline  9/17: 16.72 secs without AD/AFO donned. Improved.     Time  4    Period  Weeks    Status  Not Met      PT SHORT TERM GOAL #3   Title  Pt will amb. 250' with RW with CGA on flat, even surface for incr. community accessibility.      Baseline  9/17: Pt amb. 250' with RW, Supervision on even surfaces.     Time  4    Period  Weeks    Status  Achieved      PT SHORT TERM GOAL #4   Title  Increase gait velocity from 1.53 ft/sec to >/= 1.9 ft/sec without device for incr. gait efficiency.    Baseline  9/17: 17.44secs = 1.61f./sec with no AD/ AFO donned; Improved.    Time  4    Period  Weeks    Status  Not Met      PT SHORT TERM GOAL #5   Title  Independent in HEP for RLE strengthening and balance exercises.    Baseline  9/20: Pt states she is independent with HEP.    Time  4    Period  Weeks    Status  Achieved        PT Long Term Goals - 05/17/18 0939      PT LONG TERM GOAL #1   Title  Pt will increase Berg score from 26/56 to >/= 36/56 to reduce fall risk.    Baseline  26/56 on 03-22-18    Time  8    Period  Weeks    Status  New      PT LONG TERM GOAL #2   Title  AFO consult for RLE will be obtained - and AFO for RLE obtained if determined to be needed.     Baseline  05/17/18: consult is set for 05/24/18    Status  Achieved      PT LONG TERM GOAL #3   Title  Pt will amb. 500' with RW with supervision on flat, even surface for incr. community accessibility.    Time  8    Period  Weeks    Status  New      PT LONG TERM GOAL #4   Title  Improve TUG score from 18.09 secs to </= 13.5 secs without device  to reduce fall risk.    Baseline  18.09 secs on 03-22-18    Time  8    Period  Weeks    Status  New      PT LONG TERM GOAL #5   Title  Increase gait velocity from 1.53 ft/sec to >/= 2.2 ft/sec without device for incr. gait efficiency.     Baseline  21.47 secs = 1.53 ft/sec with no device on 03-22-18    Time  8    Period  Weeks    Status  New         05/17/18 5996  Plan  Clinical Impression Statement Today's skilled session focused on checking progress toward LTGs with all goals met. The patient is set for an orthotic consult next week and primary PT plans to recert to continue to address gait and balance.   Pt will benefit from skilled therapeutic intervention in order to improve on the following deficits Abnormal gait;Decreased activity tolerance;Decreased balance;Decreased coordination;Decreased strength;Impaired flexibility;Impaired tone;Impaired UE functional use;Pain  Rehab Potential Good  Clinical Impairments Affecting Rehab Potential chronicity of deficits - approx. 3 yrs since onset (due to transverse myelitis?)  PT Frequency 2x / week  PT Duration 8 weeks  PT Treatment/Interventions ADLs/Self Care Home Management;DME Instruction;Gait training;Stair training;Orthotic Fit/Training;Patient/family education;Neuromuscular re-education;Balance training;Therapeutic exercise;Therapeutic activities;Passive range of motion  PT Next Visit Plan begin session with ambulation with no AD before fatigue onset, status of R AFO, STS txfs with proper use of RW, emphasize right LE strengthening for improved knee control with gait, balance reactions with decr UE support  PT Home Exercise Plan see above  Consulted and Agree with Plan of Care Patient;Family member/caregiver  Family Member Consulted spouse - [name]          Patient will benefit from skilled therapeutic intervention in order to improve the following deficits and impairments:  Abnormal gait, Decreased activity tolerance, Decreased  balance, Decreased coordination, Decreased strength, Impaired flexibility, Impaired tone, Impaired UE functional use, Pain  Visit Diagnosis: Muscle weakness (generalized)  Other abnormalities of gait and mobility  Other lack of coordination  Unsteadiness on feet     Problem List Patient Active Problem List   Diagnosis Date Noted  . CAD (coronary artery disease) 09/04/2013  . HTN (hypertension) 09/04/2013  . HLD (hyperlipidemia) 09/04/2013  . Tobacco abuse 09/04/2013    Willow Ora, PTA, [facility] [address] [place], [place] [address] [phone] 05/20/18, 1:00 PM   Name: JAMIESHA VICTORIA MRN: [id number] Date of Birth: 10-Jul-1934

## 2018-05-21 ENCOUNTER — Ambulatory Visit
Admission: RE | Admit: 2018-05-21 | Discharge: 2018-05-21 | Disposition: A | Discharge status: Home / Self Care | Payer: Medicare Other | Source: Ambulatory Visit | Attending: Geriatric Medicine | Admitting: Geriatric Medicine

## 2018-05-21 ENCOUNTER — Other Ambulatory Visit: Payer: Self-pay | Admitting: Geriatric Medicine

## 2018-05-21 DIAGNOSIS — I10 Essential (primary) hypertension: Secondary | ICD-10-CM | POA: Diagnosis not present

## 2018-05-21 DIAGNOSIS — Z23 Encounter for immunization: Secondary | ICD-10-CM | POA: Diagnosis not present

## 2018-05-21 DIAGNOSIS — M542 Cervicalgia: Secondary | ICD-10-CM | POA: Diagnosis not present

## 2018-05-21 DIAGNOSIS — Z79899 Other long term (current) drug therapy: Secondary | ICD-10-CM | POA: Diagnosis not present

## 2018-05-21 DIAGNOSIS — J449 Chronic obstructive pulmonary disease, unspecified: Secondary | ICD-10-CM | POA: Diagnosis not present

## 2018-05-22 ENCOUNTER — Encounter: Payer: Self-pay | Admitting: Occupational Therapy

## 2018-05-22 ENCOUNTER — Ambulatory Visit: Payer: Medicare Other | Admitting: Occupational Therapy

## 2018-05-22 ENCOUNTER — Ambulatory Visit: Payer: Medicare Other | Admitting: Physical Therapy

## 2018-05-22 DIAGNOSIS — R2689 Other abnormalities of gait and mobility: Secondary | ICD-10-CM

## 2018-05-22 DIAGNOSIS — R2681 Unsteadiness on feet: Secondary | ICD-10-CM

## 2018-05-22 DIAGNOSIS — R278 Other lack of coordination: Secondary | ICD-10-CM | POA: Diagnosis not present

## 2018-05-22 DIAGNOSIS — M6281 Muscle weakness (generalized): Secondary | ICD-10-CM

## 2018-05-22 NOTE — Therapy (Signed)
[facility] [address] [place], [place], [address] Phone: [phone]   Fax:  [phone]  Occupational Therapy Treatment  Patient Details  Name: Kim Sutton MRN: [id number] Date of Birth: 03/06/1934 No data recorded  Encounter Date: 05/22/2018  OT End of Session - 05/22/18 1211    Visit Number  6    Number of Visits  17    Date for OT Re-Evaluation  06/25/18    Authorization Type  cert. period  04/26/18-07/24/18    Authorization Time Period  Medicare / [place]    Authorization - Visit Number  6    Authorization - Number of Visits  10    OT Start Time  0802    OT Stop Time  0845    OT Time Calculation (min)  43 min    Activity Tolerance  Patient tolerated treatment well       Past Medical History:  Diagnosis Date  . Adrenal cortical adenoma of left adrenal gland    stable since 2009;  MRI in 04/2010 - no further scans  . CAD (coronary artery disease)    a. s/p NSTEMI in 2001 tx with BMS to RCA  . Colon polyps   . HLD (hyperlipidemia)   . Hx of cardiovascular stress test    Lexiscan Myoview (09/2013):  No ischemia, EF 77%, Low Risk.  Marland Kitchen Hyperglycemia   . Hypertension   . Myoclonus    on neurontin per neuro at [facility]  . Nephrolithiasis   . Nodule of right lung    a. 42m on CXR 10/2009;  CT 05/2010 ok - no further scans  . Tobacco abuse     Past Surgical History:  Procedure Laterality Date  . CHOLECYSTECTOMY    . CORONARY ANGIOPLASTY WITH STENT PLACEMENT    . TOTAL ABDOMINAL HYSTERECTOMY      There were no vitals filed for this visit.  Subjective Assessment - 05/22/18 0805    Subjective   I get neck pain - I had it all night - but its gone now. I get it mostly at night. I had an xray yesterday     Patient is accompained by:  Family member   husband   Pertinent History  PMH:  transverse myelitis, hx of shingles per husband; CAD; HTN, HLD, hard of hearing    Limitations  fall risk, hard of hearing    Patient Stated Goals  improve ability to pick up things, use RUE more for cleaning, strengthening    Currently in Pain?  No/denies                   OT Treatments/Exercises (OP) - 05/22/18 0001      ADLs   ADL Comments  Assessed 9 hole peg - pt has met STG ( 37.69).        Neurological Re-education Exercises   Other Exercises 2  Neuro re ed in supine to address RUE proximal stability and stable mobility as well as working toward functional mid reach.  Utilized PNF patterns with intermittent manual resistance throughout the arc as tolerated.  Also addressed closed chain bilateral mid reach with full reach of arms to activate serratus.  Transitioned to unilateral open chain reach in supine in PNF patterns as well as straight plane shoulder flexion for overhead reach with intermitent assistance Utlized resistance for forced activation of R side of trunk as well as RUE.  Transitioned into sitting and addressed body on arm movements  with UE's in abduction and ER and working on lateral weight shifts.  Pt with signficant head tilt and R scapula with elevation, upward rotatio and abduction however both seem to orignate with poor trunk control (pt has excessive elongation  and some rotation of R side of trunk.  Once trunk is activated and aligned, pt's head and R shoulder girdle come into good alignment.  Worked on forward lean (forward weight translation) onto UE's with UE's in full weight bearing and manual resistance to further activate trunk and RUE.  Pt fatigues quickly but is motivated.                OT Short Term Goals - 05/22/18 1208      OT SHORT TERM GOAL #1   Title  Pt will be independent with coordination, RUE strength HEP.--check STGs 05/25/18    Time  4    Period  Weeks    Status  On-going      OT SHORT TERM GOAL #2   Title  Pt will improve RUE functional use for eating as shown by improving PPT#2 by at least 5sec.    Baseline  23.34sec    Time  4    Period  Weeks     Status  On-going      OT SHORT TERM GOAL #3   Title  Pt will improve coordination for ADLs as shown by improving time on 9-hole peg test by at least 10sec.    Baseline  59.38sec    Time  4    Period  Weeks    Status  Achieved   05/22/2018  37.69     OT SHORT TERM GOAL #4   Title  Pt will improve R grip strength for opening containers/lifting objects by at least 5lbs.    Baseline  12lbs    Time  4    Period  Weeks    Status  On-going      OT SHORT TERM GOAL #5   Title  Pt will be able to demo at least 85* R shoulder flex for functional reaching.    Baseline  75*    Time  8    Period  Weeks    Status  On-going        OT Long Term Goals - 05/22/18 1209      OT LONG TERM GOAL #1   Title  Pt will be able to cut food mod I using AE prn.    Time  8    Period  Weeks    Status  On-going      OT LONG TERM GOAL #2   Title  Pt will be able to use RUE to eat with at least 50% of meal.    Time  8    Period  Weeks    Status  On-going      OT LONG TERM GOAL #3   Title  Pt will improve RUE functional reaching/coordination for ADLs as shown by improving score on box and blocks test by at least 8.    Baseline  22    Time  8    Period  Weeks    Status  On-going      OT LONG TERM GOAL #4   Title  Pt will improve R grip strength for opening containers/lifting objects by at least 10lbs.    Baseline  12lbs    Period  Weeks    Status  On-going  OT LONG TERM GOAL #5   Title  Pt will be able to use RUE for cleaning/home maintenance tasks for at least 29mn prior to rest break.    Time  8    Period  Weeks    Status  On-going      OT LONG TERM GOAL #6   Title  Pt will be able to demo at least 95* R shoulder flex for functional reaching.    Baseline  75*    Time  8    Period  Weeks    Status  On-going            Plan - 05/22/18 1209    Clinical Impression Statement  Pt progressing toward goals. Pt with slowly improving RUE low to mid reach however will need to  further address active trunk alignment.     Occupational Profile and client history currently impacting functional performance  Pt is now limited in IADL performance, particularly when using dominant RUE.  Pt has to switch to nondominant LUE during ADLs/IADLs.      Occupational performance deficits (Please refer to evaluation for details):  ADL's;IADL's;Leisure;Social Participation    Rehab Potential  Good    OT Frequency  2x / week    OT Duration  8 weeks    OT Treatment/Interventions  Self-care/ADL training;Therapeutic exercise;Patient/family education;Neuromuscular education;Moist Heat;Fluidtherapy;Energy conservation;FTherapist, nutritionalTherapeutic activities;Balance training;Passive range of motion;Manual Therapy;DME and/or AE instruction;Cryotherapy    Plan  NMR for trunk/head/R shoulder girdle active alignment, NMR for proximal R shoulder, RUE functional use    Consulted and Agree with Plan of Care  Patient;Family member/caregiver    Family Member Consulted  husband       Patient will benefit from skilled therapeutic intervention in order to improve the following deficits and impairments:  Decreased knowledge of use of DME, Decreased coordination, Decreased mobility, Decreased strength, Decreased range of motion, Decreased endurance, Decreased activity tolerance, Decreased balance, Impaired UE functional use  Visit Diagnosis: Muscle weakness (generalized)  Other lack of coordination  Unsteadiness on feet    Problem List Patient Active Problem List   Diagnosis Date Noted  . CAD (coronary artery disease) 09/04/2013  . HTN (hypertension) 09/04/2013  . HLD (hyperlipidemia) 09/04/2013  . Tobacco abuse 09/04/2013    PQuay Burow OTR/L 05/22/2018, 12:12 PM  [facility] [address]Phone: [phone]  Fax:  [phone] Name: Kim KNOCKMRN: [id number] of  Birth: 4Aug 05, 1935

## 2018-05-23 ENCOUNTER — Encounter: Payer: Self-pay | Admitting: Physical Therapy

## 2018-05-23 NOTE — Therapy (Deleted)
[facility] [address] [place], [place], [address] Phone: [phone]   Fax:  [phone]  Physical Therapy                                                                                                       Physical Therapy Treatment Patient Details  Name: Kim Sutton MRN: [id number] Date of Birth: [birth year] Referring Provider (PT): Dr. Lajean Manes   Encounter Date: 05/22/2018  PT End of Session - 05/23/18 1233    Visit Number  16    Number of Visits  23    Date for PT Re-Evaluation  06/22/18    Authorization Type  Medicare and [employer]    Authorization Time Period  03-22-18 - 06-20-18; 05-22-18 - 07-31-18    PT Start Time  0850    PT Stop Time  0930    PT Time Calculation (min)  40 min       Past Medical History:  Diagnosis Date  . Adrenal cortical adenoma of left adrenal gland    stable since 2009;  MRI in 04/2010 - no further scans  . CAD (coronary artery disease)    a. s/p NSTEMI in 2001 tx with BMS to RCA  . Colon polyps   . HLD (hyperlipidemia)   . Hx of cardiovascular stress test    Lexiscan Myoview (09/2013):  No ischemia, EF 77%, Low Risk.  Marland Kitchen Hyperglycemia   . Hypertension   . Myoclonus    on neurontin per neuro at [facility]  . Nephrolithiasis   . Nodule of right lung    a. 29m on CXR 10/2009;  CT 05/2010 ok - no further scans  . Tobacco abuse     Past Surgical History:  Procedure Laterality Date  . CHOLECYSTECTOMY    . CORONARY ANGIOPLASTY WITH STENT PLACEMENT    . TOTAL ABDOMINAL HYSTERECTOMY      There were no vitals filed for this visit.   Subjective Assessment - 05/23/18 1223    Subjective  Pt reports no changes since PT session last week; husband states pt is improving    Patient is accompained by:  Family member    Pertinent History  h/o transverse myelitis;  (husband reports Rt sided weakness is due to pt having had lesion on spine due to shingles approx. 3 yrs ago):  CAD; HTN     Patient Stated Goals  "get up and walk some place"    Currently in Pain?  No/denies                    Objective measurements completed on examination: See above findings.      [facility]ult PT Treatment/Exercise - 05/23/18 0001      Transfers   Transfers  Sit to Stand;Stand to Sit    Sit to Stand  5: Supervision;With upper extremity assist;From bed;From chair/3-in-1    Stand to Sit  5: Supervision;With upper extremity assist;To bed;To chair/3-in-1    Number of Reps  10 reps   5 reps feet on floor:  5 reps Lt foot on balance bubble      Ambulation/Gait   Ambulation/Gait  Yes    Ambulation/Gait Assistance  4: Min guard    Ambulation/Gait Assistance Details  increased Rt knee hyperextension noted with fatigue     Ambulation Distance (Feet)  350 Feet    Assistive device  None    Gait Pattern  Decreased hip/knee flexion - right;Decreased dorsiflexion - right;Decreased weight shift to right;Right genu recurvatum;Lateral trunk lean to right;Decreased dorsiflexion - left    Ambulation Surface  Level;Indoor      Exercises   Exercises  Knee/Hip      Knee/Hip Exercises: Standing   Heel Raises  Both;1 set;10 reps    Hip Flexion  Right;1 set;10 reps;Stengthening;Knee bent   3# weight 10 reps   Hip Abduction  Stengthening;Right;1 set;10 reps;Knee straight   3# weight    Hip Extension  Stengthening;Right;1 set;10 reps;Knee bent   3# weight used on RLE   Forward Step Up  Right;1 set;10 reps;Hand Hold: 1;Step Height: 6"          Balance Exercises - 05/23/18 1227      Balance Exercises: Standing   Step Over Hurdles / Cones  Pt performed stepping over orange hurdle 10 reps each leg with min to CGA with min. UE support     Other Standing Exercises  Pt performed tap ups to 6" step with LLE for Rt knee control with cues to prevent hyperextension as able;  pt used 1 UE support on rail with CGA           PT Short Term Goals - 04/24/18 0935      PT SHORT TERM GOAL #1    Title  Improve Berg score from 26/56 to >/= 31/56 to reduce fall risk.    Baseline  9/20: 43/56     Time  4    Period  Weeks    Status  Achieved      PT SHORT TERM GOAL #2   Title  Improve TUG score from 18.09 secs without device to </= 15 secs without device for improved functional mobility and reduced fall risk.    Baseline  9/17: 16.72 secs without AD/AFO donned. Improved.     Time  4    Period  Weeks    Status  Not Met      PT SHORT TERM GOAL #3   Title  Pt will amb. 250' with RW with CGA on flat, even surface for incr. community accessibility.      Baseline  9/17: Pt amb. 250' with RW, Supervision on even surfaces.     Time  4    Period  Weeks    Status  Achieved      PT SHORT TERM GOAL #4   Title  Increase gait velocity from 1.53 ft/sec to >/= 1.9 ft/sec without device for incr. gait efficiency.    Baseline  9/17: 17.44secs = 1.70f./sec with no AD/ AFO donned; Improved.    Time  4    Period  Weeks    Status  Not Met      PT SHORT TERM GOAL #5   Title  Independent in HEP for RLE strengthening and balance exercises.    Baseline  9/20: Pt states she is independent with HEP.    Time  4    Period  Weeks    Status  Achieved        PT Long Term Goals - 05/23/18 1843  PT LONG TERM GOAL #1   Title  Pt will increase Berg score from 26/56 to >/= 36/56 to reduce fall risk.    Baseline  05/17/18: 47/56    Status  Achieved    Target Date  05/22/18      PT LONG TERM GOAL #2   Title  AFO consult for RLE will be obtained - and AFO for RLE obtained if determined to be needed.     Baseline  05/17/18: consult is set for 05/24/18    Status  On-going    Target Date  06/30/18      PT LONG TERM GOAL #3   Title  Pt will amb. 500' with RW with supervision on flat, even surface for incr. community accessibility.    Status  Achieved      PT LONG TERM GOAL #4   Title  Improve TUG score from 18.09 secs to </= 13.5 secs without device to reduce fall risk.    Baseline  05/17/18:  13.35 sec's with no AD/brace used    Status  Achieved      PT LONG TERM GOAL #5   Title  Increase gait velocity from 1.53 ft/sec to >/= 2.2 ft/sec without device for incr. gait efficiency.     Baseline  05/17/18: 2.37 ft/sec no AD/brace used    Status  Achieved      Additional Long Term Goals   Additional Long Term Goals  Yes      PT LONG TERM GOAL #6   Title  Pt will be modified independent with household ambulation with use of Rt AFO.    Time  4    Period  Weeks    Status  New    Target Date  06/30/18      PT LONG TERM GOAL #7   Title  Pt will increase Berg balance test score from 47/56 to >/= 50/56 to reduce fall risk.    Baseline  47/56 on 05-17-18    Time  4    Period  Weeks    Status  New    Target Date  06/30/18      PT LONG TERM GOAL #8   Title  Negotiate 12 steps with 1 hand rail using a step over step sequence with ascension and step by step sequence with descension.     Time  4    Period  Weeks    Status  New    Target Date  06/30/18             Plan - 05/23/18 1847    Clinical Impression Statement  Pt has met all LTG's with exception of LTG #2 which is on-going as pt is scheduled for AFO consult on 05-24-18.  Pt has made good progress with increasing RLE strength and balance and gait; pt requests/agrees with recertification for 4 additional weeks.    Rehab Potential  Good    Clinical Impairments Affecting Rehab Potential  chronicity of deficits - approx. 3 yrs since onset (due to transverse myelitis?)    PT Frequency  2x / week    PT Duration  8 weeks    PT Treatment/Interventions  ADLs/Self Care Home Management;DME Instruction;Gait training;Stair training;Orthotic Fit/Training;Patient/family education;Neuromuscular re-education;Balance training;Therapeutic exercise;Therapeutic activities;Passive range of motion    PT Next Visit Plan  AFO consult scheduled for 05-24-18 with Gerald Stabs from [facility]  see above    Consulted and Agree with  Plan of Care  Patient;Family member/caregiver    Family Member Consulted  spouse - [name]       Patient will benefit from skilled therapeutic intervention in order to improve the following deficits and impairments:  Abnormal gait, Decreased activity tolerance, Decreased balance, Decreased coordination, Decreased strength, Impaired flexibility, Impaired tone, Impaired UE functional use, Pain  Visit Diagnosis: Other abnormalities of gait and mobility - Plan: PT plan of care cert/re-cert  Unsteadiness on feet - Plan: PT plan of care cert/re-cert  Muscle weakness (generalized) - Plan: PT plan of care cert/re-cert     Problem List Patient Active Problem List   Diagnosis Date Noted  . CAD (coronary artery disease) 09/04/2013  . HTN (hypertension) 09/04/2013  . HLD (hyperlipidemia) 09/04/2013  . Tobacco abuse 09/04/2013    Alda Lea, PT 05/23/2018, 7:06 PM  [facility] [address] [place], [place], [address] Phone: [phone]   Fax:  [phone]  Name: Kim Sutton MRN: [id number] Date of Birth: 1934-07-22

## 2018-05-24 ENCOUNTER — Ambulatory Visit: Payer: Medicare Other | Admitting: Occupational Therapy

## 2018-05-24 ENCOUNTER — Ambulatory Visit: Payer: Medicare Other | Admitting: Physical Therapy

## 2018-05-24 DIAGNOSIS — M6281 Muscle weakness (generalized): Secondary | ICD-10-CM | POA: Diagnosis not present

## 2018-05-24 DIAGNOSIS — R278 Other lack of coordination: Secondary | ICD-10-CM | POA: Diagnosis not present

## 2018-05-24 DIAGNOSIS — R2689 Other abnormalities of gait and mobility: Secondary | ICD-10-CM

## 2018-05-24 DIAGNOSIS — R2681 Unsteadiness on feet: Secondary | ICD-10-CM

## 2018-05-24 NOTE — Therapy (Signed)
[facility] [address] [place], [place], [address] Phone: [phone]   Fax:  [phone]  Occupational Therapy Treatment  Patient Details  Name: Kim Sutton MRN: [id number] Date of Birth: [birth year] No data recorded  Encounter Date: 05/24/2018  OT End of Session - 05/24/18 0814    Visit Number  7    Number of Visits  17    Date for OT Re-Evaluation  06/25/18    Authorization Type  cert. period  04/26/18-07/24/18    Authorization Time Period  Medicare / [place]    Authorization - Visit Number  7    Authorization - Number of Visits  10    OT Start Time  [id number]    OT Stop Time  0845    OT Time Calculation (min)  38 min    Activity Tolerance  Patient tolerated treatment well    Behavior During Therapy  WFL for tasks assessed/performed       Past Medical History:  Diagnosis Date  . Adrenal cortical adenoma of left adrenal gland    stable since 2009;  MRI in 04/2010 - no further scans  . CAD (coronary artery disease)    a. s/p NSTEMI in 2001 tx with BMS to RCA  . Colon polyps   . HLD (hyperlipidemia)   . Hx of cardiovascular stress test    Lexiscan Myoview (09/2013):  No ischemia, EF 77%, Low Risk.  Marland Kitchen Hyperglycemia   . Hypertension   . Myoclonus    on [facility]ont[facility] at [facility]  . Nephrolithiasis   . Nodule of right lung    a. 3m on CXR 10/2009;  CT 05/2010 ok - no further scans  . Tobacco abuse     Past Surgical History:  Procedure Laterality Date  . CHOLECYSTECTOMY    . CORONARY ANGIOPLASTY WITH STENT PLACEMENT    . TOTAL ABDOMINAL HYSTERECTOMY      There were no vitals filed for this visit.  Subjective Assessment - 05/24/18 0814    Pertinent History  PMH:  transverse myelitis, hx of shingles per husband; CAD; HTN, HLD, hard of hearing    Patient Stated Goals  improve ability to pick up things, use RUE more for cleaning, strengthening    Currently in Pain?  No/denies             Treatment:  Supine closed chain shoulder flexion and chest press, min mod facilitation Seated with arms in external rotation scapular retraction AA/ROM low range closed chain shoulder flexion mod facilitation RUE Standing to place red and yellow clothespins on vertical antenna, min-mod facilitation to avoid compensation. Seated placing perfection pieces into pegboard for RUE functional use, mod v.c Simulated eating and cutting food, incorporating RUE, pt with improved success, pt was encouraged to practice at home. (Pt met feeding goal)               OT Short Term Goals - 05/24/18 0834      OT SHORT TERM GOAL #1   Title  Pt will be independent with coordination, RUE strength HEP.--check STGs 05/25/18    Time  4    Period  Weeks    Status  On-going      OT SHORT TERM GOAL #2   Title  Pt will improve RUE functional use for eating as shown by improving PPT#2 by at least 5sec.    Baseline  23.34sec    Time  4    Period  Weeks  Status  Achieved   16.0 secs     OT SHORT TERM GOAL #3   Title  Pt will improve coordination for ADLs as shown by improving time on 9-hole peg test by at least 10sec.    Baseline  59.38sec    Time  4    Period  Weeks    Status  Achieved   05/22/2018  37.69     OT SHORT TERM GOAL #4   Title  Pt will improve R grip strength for opening containers/lifting objects by at least 5lbs.    Baseline  12lbs    Time  4    Period  Weeks    Status  On-going      OT SHORT TERM GOAL #5   Title  Pt will be able to demo at least 85* R shoulder flex for functional reaching.    Baseline  75*    Time  8    Period  Weeks    Status  On-going        OT Long Term Goals - 05/22/18 1209      OT LONG TERM GOAL #1   Title  Pt will be able to cut food mod I using AE prn.    Time  8    Period  Weeks    Status  On-going      OT LONG TERM GOAL #2   Title  Pt will be able to use RUE to eat with at least 50% of meal.    Time  8    Period  Weeks    Status  On-going       OT LONG TERM GOAL #3   Title  Pt will improve RUE functional reaching/coordination for ADLs as shown by improving score on box and blocks test by at least 8.    Baseline  22    Time  8    Period  Weeks    Status  On-going      OT LONG TERM GOAL #4   Title  Pt will improve R grip strength for opening containers/lifting objects by at least 10lbs.    Baseline  12lbs    Period  Weeks    Status  On-going      OT LONG TERM GOAL #5   Title  Pt will be able to use RUE for cleaning/home maintenance tasks for at least 62mn prior to rest break.    Time  8    Period  Weeks    Status  On-going      OT LONG TERM GOAL #6   Title  Pt will be able to demo at least 95* R shoulder flex for functional reaching.    Baseline  75*    Time  8    Period  Weeks    Status  On-going            Plan - 05/24/18 0815    Clinical Impression Statement  Pt progressing toward goals. Pt with slowly improving RUE functional use.    Occupational Profile and client history currently impacting functional performance  Pt is now limited in IADL performance, particularly when using dominant RUE.  Pt has to switch to nondominant LUE during ADLs/IADLs.      Occupational performance deficits (Please refer to evaluation for details):  ADL's;IADL's;Leisure;Social Participation    Rehab Potential  Good    OT Frequency  2x / week    OT Duration  8 weeks  OT Treatment/Interventions  Self-care/ADL training;Therapeutic exercise;Patient/family education;Neuromuscular education;Moist Heat;Fluidtherapy;Energy conservation;Therapist, nutritional;Therapeutic activities;Balance training;Passive range of motion;Manual Therapy;DME and/or AE instruction;Cryotherapy    Plan  check STG's, NMR    Consulted and Agree with Plan of Care  Patient;Family member/caregiver    Family Member Consulted  husband       Patient will benefit from skilled therapeutic intervention in order to improve the following deficits and  impairments:  Decreased knowledge of use of DME, Decreased coordination, Decreased mobility, Decreased strength, Decreased range of motion, Decreased endurance, Decreased activity tolerance, Decreased balance, Impaired UE functional use  Visit Diagnosis: Muscle weakness (generalized)  Other lack of coordination    Problem List Patient Active Problem List   Diagnosis Date Noted  . CAD (coronary artery disease) 09/04/2013  . HTN (hypertension) 09/04/2013  . HLD (hyperlipidemia) 09/04/2013  . Tobacco abuse 09/04/2013    Kemarion Abbey 05/24/2018, 8:45 AM  [facility] [address] [place], [place], [address] Phone: [phone]   Fax:  [phone]  Name: Kim Sutton MRN: [id number] Date of Birth: 24-Jan-1934

## 2018-05-25 ENCOUNTER — Encounter: Payer: Self-pay | Admitting: Physical Therapy

## 2018-05-25 NOTE — Therapy (Addendum)
[facility] [address] [place], [place], [address] Phone: [phone]   Fax:  [phone]  Physical Therapy Treatment  Patient Details  Name: Kim Sutton MRN: [id number] Date of Birth: 1934-05-11 Referring Provider (PT): Dr. Lajean Manes   Encounter Date: 05/24/2018  PT End of Session - 05/25/18 1510    Visit Number  17    Number of Visits  23    Date for PT Re-Evaluation  06/22/18    Authorization Type  Medicare and [employer]    Authorization Time Period  03-22-18 - 06-20-18; 05-22-18 - 07-31-18    PT Start Time  0850    PT Stop Time  0933    PT Time Calculation (min)  43 min       Past Medical History:  Diagnosis Date  . Adrenal cortical adenoma of left adrenal gland    stable since 2009;  MRI in 04/2010 - no further scans  . CAD (coronary artery disease)    a. s/p NSTEMI in 2001 tx with BMS to RCA  . Colon polyps   . HLD (hyperlipidemia)   . Hx of cardiovascular stress test    Lexiscan Myoview (09/2013):  No ischemia, EF 77%, Low Risk.  Marland Kitchen Hyperglycemia   . Hypertension   . Myoclonus    on neurontin per neuro at [facility]  . Nephrolithiasis   . Nodule of right lung    a. 33m on CXR 10/2009;  CT 05/2010 ok - no further scans  . Tobacco abuse     Past Surgical History:  Procedure Laterality Date  . CHOLECYSTECTOMY    . CORONARY ANGIOPLASTY WITH STENT PLACEMENT    . TOTAL ABDOMINAL HYSTERECTOMY      There were no vitals filed for this visit.  Subjective Assessment - 05/25/18 1503    Subjective  Pt reports no changes - [facility] for a consult for Rt AFO    Patient is accompained by:  Family member    Pertinent History  h/o transverse myelitis;  (husband reports Rt sided weakness is due to pt having had lesion on spine due to shingles approx. 3 yrs ago):  CAD; HTN    Patient Stated Goals  "get up and walk some place"    Currently in Pain?   No/denies                       [facility]25[facility] 0001      Transfers   Transfers  Sit to Stand;Stand to Sit    Sit to Stand  5: Supervision    Stand to Sit  5: Supervision;With upper extremity assist;To bed;To chair/3-in-1    Number of Reps  Other reps (comment)   5 reps without UE support     Ambulation/Gait   Ambulation/Gait  Yes    Ambulation/Gait Assistance  4: Min guard    Ambulation/Gait Assistance Details  Rt knee hyperextension occurred during gait training     Ambulation Distance (Feet)  250 Feet    Assistive device  None    Gait Pattern  Decreased hip/knee flexion - right;Decreased dorsiflexion - right;Decreased weight shift to right;Right genu recurvatum;Lateral trunk lean to right;Decreased dorsiflexion - left    Ambulation Surface  Level;Indoor      Self-Care   Other Self-Care Comments   CBrooke Pace present for orthotic consult for Rt AFO:  discussed AFO  recommended (custom plastic due to knee hyperextension)      Knee/Hip Exercises: Standing   Heel Raises  Both;1 set;10 reps    Hip Flexion  Right;1 set;10 reps;Stengthening;Knee bent   3# weight 10 reps   Hip Abduction  Stengthening;Right;1 set;10 reps;Knee straight   3# weight    Hip Extension  Stengthening;Right;1 set;10 reps;Knee bent   3# weight used on RLE   Forward Step Up  Right;1 set;10 reps;Hand Hold: 1;Step Height: 6"      NeuroRe-ed:  Alternate tap ups to 1st, 2nd step with UE support prn with CGA Stepping over and back of balance beam 10 reps each leg with UE support prn on counter top with CGA         PT Short Term Goals - 04/24/18 0935      PT SHORT TERM GOAL #1   Title  Improve Berg score from 26/56 to >/= 31/56 to reduce fall risk.    Baseline  9/20: 43/56     Time  4    Period  Weeks    Status  Achieved      PT SHORT TERM GOAL #2   Title  Improve TUG score from 18.09 secs without device to </= 15 secs without device for improved  functional mobility and reduced fall risk.    Baseline  9/17: 16.72 secs without AD/AFO donned. Improved.     Time  4    Period  Weeks    Status  Not Met      PT SHORT TERM GOAL #3   Title  Pt will amb. 250' with RW with CGA on flat, even surface for incr. community accessibility.      Baseline  9/17: Pt amb. 250' with RW, Supervision on even surfaces.     Time  4    Period  Weeks    Status  Achieved      PT SHORT TERM GOAL #4   Title  Increase gait velocity from 1.53 ft/sec to >/= 1.9 ft/sec without device for incr. gait efficiency.    Baseline  9/17: 17.44secs = 1.55f./sec with no AD/ AFO donned; Improved.    Time  4    Period  Weeks    Status  Not Met      PT SHORT TERM GOAL #5   Title  Independent in HEP for RLE strengthening and balance exercises.    Baseline  9/20: Pt states she is independent with HEP.    Time  4    Period  Weeks    Status  Achieved        PT Long Term Goals - 05/23/18 1843      PT LONG TERM GOAL #1   Title  Pt will increase Berg score from 26/56 to >/= 36/56 to reduce fall risk.    Baseline  05/17/18: 47/56    Status  Achieved    Target Date  05/22/18      PT LONG TERM GOAL #2   Title  AFO consult for RLE will be obtained - and AFO for RLE obtained if determined to be needed.     Baseline  05/17/18: consult is set for 05/24/18    Status  On-going    Target Date  06/30/18      PT LONG TERM GOAL #3   Title  Pt will amb. 500' with RW with supervision on flat, even surface for incr. community accessibility.    Status  Achieved  PT LONG TERM GOAL #4   Title  Improve TUG score from 18.09 secs to </= 13.5 secs without device to reduce fall risk.    Baseline  05/17/18: 13.35 sec's with no AD/brace used    Status  Achieved      PT LONG TERM GOAL #5   Title  Increase gait velocity from 1.53 ft/sec to >/= 2.2 ft/sec without device for incr. gait efficiency.     Baseline  05/17/18: 2.37 ft/sec no AD/brace used    Status  Achieved       Additional Long Term Goals   Additional Long Term Goals  Yes      PT LONG TERM GOAL #6   Title  Pt will be modified independent with household ambulation with use of Rt AFO.    Time  4    Period  Weeks    Status  New    Target Date  06/30/18      PT LONG TERM GOAL #7   Title  Pt will increase Berg balance test score from 47/56 to >/= 50/56 to reduce fall risk.    Baseline  47/56 on 05-17-18    Time  4    Period  Weeks    Status  New    Target Date  06/30/18      PT LONG TERM GOAL #8   Title  Negotiate 12 steps with 1 hand rail using a step over step sequence with ascension and step by step sequence with descension.     Time  4    Period  Weeks    Status  New    Target Date  06/30/18            Plan - 05/25/18 1511    Clinical Impression Statement  Pt exhibited Rt knee hyperextension at start of gait training - occurred without fatigue - orthotist, Brooke Pace, from [place] present and recommended custom plastic AFO to control knee hyperextension     Rehab Potential  Good    Clinical Impairments Affecting Rehab Potential  chronicity of deficits - approx. 3 yrs since onset (due to transverse myelitis?)    PT Frequency  2x / week    PT Duration  8 weeks    PT Treatment/Interventions  ADLs/Self Care Home Management;DME Instruction;Gait training;Stair training;Orthotic Fit/Training;Patient/family education;Neuromuscular re-education;Balance training;Therapeutic exercise;Therapeutic activities;Passive range of motion    PT Next Visit Plan  continue RLE strengthening and gait training    Consulted and Agree with Plan of Care  Patient;Family member/caregiver    Family Member Consulted  spouse - [name]       Patient will benefit from skilled therapeutic intervention in order to improve the following deficits and impairments:  Abnormal gait, Decreased activity tolerance, Decreased balance, Decreased coordination, Decreased strength, Impaired flexibility, Impaired tone, Impaired UE  functional use, Pain  Visit Diagnosis: Muscle weakness (generalized)  Other abnormalities of gait and mobility  Unsteadiness on feet     Problem List Patient Active Problem List   Diagnosis Date Noted  . CAD (coronary artery disease) 09/04/2013  . HTN (hypertension) 09/04/2013  . HLD (hyperlipidemia) 09/04/2013  . Tobacco abuse 09/04/2013    Alda Lea, PT 05/25/2018, 3:15 PM  [facility] [address] [place], [place], [address] Phone: [phone]   Fax:  [phone]  Name: MARLYNE TOTARO MRN: [id number] Date of Birth: 22-Nov-1933

## 2018-05-29 ENCOUNTER — Encounter: Payer: Self-pay | Admitting: Physical Therapy

## 2018-05-29 ENCOUNTER — Encounter: Payer: Self-pay | Admitting: Occupational Therapy

## 2018-05-29 ENCOUNTER — Ambulatory Visit: Payer: Medicare Other | Admitting: Occupational Therapy

## 2018-05-29 ENCOUNTER — Ambulatory Visit: Payer: Medicare Other | Admitting: Physical Therapy

## 2018-05-29 DIAGNOSIS — R278 Other lack of coordination: Secondary | ICD-10-CM

## 2018-05-29 DIAGNOSIS — M6281 Muscle weakness (generalized): Secondary | ICD-10-CM | POA: Diagnosis not present

## 2018-05-29 DIAGNOSIS — R2689 Other abnormalities of gait and mobility: Secondary | ICD-10-CM | POA: Diagnosis not present

## 2018-05-29 DIAGNOSIS — R2681 Unsteadiness on feet: Secondary | ICD-10-CM | POA: Diagnosis not present

## 2018-05-29 NOTE — Therapy (Signed)
[facility] [address] [place], [place], [address] Phone: [phone]   Fax:  [phone]  Occupational Therapy Treatment  Patient Details  Name: Kim Sutton MRN: [id number] Date of Birth: 01-28-1934 No data recorded  Encounter Date: 05/29/2018  OT End of Session - 05/29/18 0943    Visit Number  8    Number of Visits  17    Date for OT Re-Evaluation  06/25/18    Authorization Type  cert. period  04/26/18-07/24/18    Authorization Time Period  Medicare / [place] - Visit Number  8    Authorization - Number of Visits  10    OT Start Time  [phone]    OT Stop Time  1015    OT Time Calculation (min)  39 min    Activity Tolerance  Patient tolerated treatment well    Behavior During Therapy  WFL for tasks assessed/performed       Past Medical History:  Diagnosis Date  . Adrenal cortical adenoma of left adrenal gland    stable since 2009;  MRI in 04/2010 - no further scans  . CAD (coronary artery disease)    a. s/p NSTEMI in 2001 tx with BMS to RCA  . Colon polyps   . HLD (hyperlipidemia)   . Hx of cardiovascular stress test    Lexiscan Myoview (09/2013):  No ischemia, EF 77%, Low Risk.  Marland Kitchen Hyperglycemia   . Hypertension   . Myoclonus    on neurontin per neuro at [facility]  . Nephrolithiasis   . Nodule of right lung    a. 24m on CXR 10/2009;  CT 05/2010 ok - no further scans  . Tobacco abuse     Past Surgical History:  Procedure Laterality Date  . CHOLECYSTECTOMY    . CORONARY ANGIOPLASTY WITH STENT PLACEMENT    . TOTAL ABDOMINAL HYSTERECTOMY      There were no vitals filed for this visit.  Subjective Assessment - 05/29/18 0942    Subjective   just tired after PT.  I know that it is getting better because I can do things that I couldn't before like opening things.    Pertinent History  PMH:  transverse myelitis, hx of shingles per husband; CAD; HTN, HLD, hard of hearing    Limitations  fall risk,  hard of hearing    Patient Stated Goals  improve ability to pick up things, use RUE more for cleaning, strengthening    Currently in Pain?  No/denies      Placing small pegs in pegboard to copy design with min cueing for trunk/shoulder compensation.  Removing with in-hand manipulation with mod difficulty.   In supine, closed chain shoulder flex and chest press x15 each with ball.  In sitting, functional reaching to pick up coins  And place in container with mid-range functional reaching with min cueing for shoulder hike compensation.  In sitting, flipping cards with RUE incorporating shoulder ER, supination and finger ext with min v.c. For compensation.  Stacking 1-inch blocks for incr coordination, mid-range functional reach with min difficulty and cueing for shoulder/trunk compensation.     OT Education - 05/29/18 1007    Education Details  updated putty HEP to red    Person(s) Educated  Patient    Methods  Explanation;Demonstration;Verbal cues    Comprehension  Verbalized understanding;Returned demonstration;Verbal cues required       OT Short Term Goals - 05/29/18 02993  OT SHORT TERM GOAL #1   Title  Pt will be independent with coordination, RUE strength HEP.--check STGs 05/25/18    Time  4    Period  Weeks    Status  Achieved      OT SHORT TERM GOAL #2   Title  Pt will improve RUE functional use for eating as shown by improving PPT#2 by at least 5sec.    Baseline  23.34sec    Time  4    Period  Weeks    Status  Achieved   16.0 secs     OT SHORT TERM GOAL #3   Title  Pt will improve coordination for ADLs as shown by improving time on 9-hole peg test by at least 10sec.    Baseline  59.38sec    Time  4    Period  Weeks    Status  Achieved   05/22/2018  37.69     OT SHORT TERM GOAL #4   Title  Pt will improve R grip strength for opening containers/lifting objects by at least 5lbs.    Baseline  12lbs    Time  4    Period  Weeks    Status  Achieved    05/29/18:  22lbs     OT SHORT TERM GOAL #5   Title  Pt will be able to demo at least 85* R shoulder flex for functional reaching.    Baseline  75*    Time  8    Period  Weeks    Status  On-going   05/19/18 80*       OT Long Term Goals - 05/22/18 1209      OT LONG TERM GOAL #1   Title  Pt will be able to cut food mod I using AE prn.    Time  8    Period  Weeks    Status  On-going      OT LONG TERM GOAL #2   Title  Pt will be able to use RUE to eat with at least 50% of meal.    Time  8    Period  Weeks    Status  On-going      OT LONG TERM GOAL #3   Title  Pt will improve RUE functional reaching/coordination for ADLs as shown by improving score on box and blocks test by at least 8.    Baseline  22    Time  8    Period  Weeks    Status  On-going      OT LONG TERM GOAL #4   Title  Pt will improve R grip strength for opening containers/lifting objects by at least 10lbs.    Baseline  12lbs    Period  Weeks    Status  On-going      OT LONG TERM GOAL #5   Title  Pt will be able to use RUE for cleaning/home maintenance tasks for at least 45mn prior to rest break.    Time  8    Period  Weeks    Status  On-going      OT LONG TERM GOAL #6   Title  Pt will be able to demo at least 95* R shoulder flex for functional reaching.    Baseline  75*    Time  8    Period  Weeks    Status  On-going            Plan - 05/29/18 1000  Clinical Impression Statement  Pt progressing toward goals with 4/5 STGs met. Pt demo improving functional reach with less compensation, improved coordination and hand strength.    Occupational Profile and client history currently impacting functional performance  Pt is now limited in IADL performance, particularly when using dominant RUE.  Pt has to switch to nondominant LUE during ADLs/IADLs.      Occupational performance deficits (Please refer to evaluation for details):  ADL's;IADL's;Leisure;Social Participation    Rehab Potential  Good     OT Frequency  2x / week    OT Duration  8 weeks    OT Treatment/Interventions  Self-care/ADL training;Therapeutic exercise;Patient/family education;Neuromuscular education;Moist Heat;Fluidtherapy;Energy conservation;Therapist, nutritional;Therapeutic activities;Balance training;Passive range of motion;Manual Therapy;DME and/or AE instruction;Cryotherapy    Plan  continue with neuro re-ed, RUE functional use, coordination, strength    Consulted and Agree with Plan of Care  Patient;Family member/caregiver    Family Member Consulted  husband       Patient will benefit from skilled therapeutic intervention in order to improve the following deficits and impairments:  Decreased knowledge of use of DME, Decreased coordination, Decreased mobility, Decreased strength, Decreased range of motion, Decreased endurance, Decreased activity tolerance, Decreased balance, Impaired UE functional use  Visit Diagnosis: Other lack of coordination  Muscle weakness (generalized)  Unsteadiness on feet    Problem List Patient Active Problem List   Diagnosis Date Noted  . CAD (coronary artery disease) 09/04/2013  . HTN (hypertension) 09/04/2013  . HLD (hyperlipidemia) 09/04/2013  . Tobacco abuse 09/04/2013    [facility] 05/29/2018, 6:19 PM  [facility] [address] [place], [place], [address] Phone: [phone]   Fax:  [phone]  Name: Kim Sutton MRN: [id number] Date of Birth: 02/24/1934   Vianne Bulls, OTR/L [facility] [address] [place], [place]  [address] [phone] phone [phone] 05/29/18 6:19 PM

## 2018-05-29 NOTE — Therapy (Signed)
[facility] [address] [place], [place], [address] Phone: [phone]   Fax:  [phone]  Physical Therapy Treatment  Patient Details  Name: Kim Sutton MRN: [id number] Date of Birth: 1933/12/13 Referring Provider (PT): Dr. Lajean Manes   Encounter Date: 05/29/2018  PT End of Session - 05/29/18 0858    Visit Number  18    Number of Visits  23    Date for PT Re-Evaluation  06/22/18    Authorization Type  Medicare and [employer]ARP    Authorization Time Period  03-22-18 - 06-20-18; 05-22-18 - 07-31-18    PT Start Time  0803    PT Stop Time  0846    PT Time Calculation (min)  43 min       Past Medical History:  Diagnosis Date  . Adrenal cortical adenoma of left adrenal gland    stable since 2009;  MRI in 04/2010 - no further scans  . CAD (coronary artery disease)    a. s/p NSTEMI in 2001 tx with BMS to RCA  . Colon polyps   . HLD (hyperlipidemia)   . Hx of cardiovascular stress test    Lexiscan Myoview (09/2013):  No ischemia, EF 77%, Low Risk.  Marland Kitchen Hyperglycemia   . Hypertension   . Myoclonus    on neurontin per neuro at [facility]  . Nephrolithiasis   . Nodule of right lung    a. 20m on CXR 10/2009;  CT 05/2010 ok - no further scans  . Tobacco abuse     Past Surgical History:  Procedure Laterality Date  . CHOLECYSTECTOMY    . CORONARY ANGIOPLASTY WITH STENT PLACEMENT    . TOTAL ABDOMINAL HYSTERECTOMY      There were no vitals filed for this visit.  Subjective Assessment - 05/29/18 0806    Subjective  Pt reports "I'm just tired" - no changes otherwise    Pertinent History  h/o transverse myelitis;  (husband reports Rt sided weakness is due to pt having had lesion on spine due to shingles approx. 3 yrs ago):  CAD; HTN    Patient Stated Goals  "get up and walk some place"    Currently in Pain?  No/denies                       [facility] - 05[facility] 0807      Transfers    Transfers  Sit to Stand    Sit to Stand  5: Supervision    Stand to Sit  5: Supervision;With upper extremity assist;To bed;To chair/3-in-1    Number of Reps  10 reps    Comments  Lt foot on balance bubble      Ambulation/Gait   Ambulation/Gait  Yes    Ambulation/Gait Assistance  5: Supervision    Ambulation/Gait Assistance Details  cues for Rt knee control to prevent hyperextension    Ambulation Distance (Feet)  250 Feet    Assistive device  None    Gait Pattern  Decreased hip/knee flexion - right;Decreased dorsiflexion - right;Decreased weight shift to right;Right genu recurvatum;Lateral trunk lean to right;Decreased dorsiflexion - left    Ambulation Surface  Level;Indoor      Exercises   Exercises  Knee/Hip;Ankle      Knee/Hip Exercises: Stretches   Other Knee/Hip Stretches  Runner's stretch on step for RLE stretch 30 sec hold      Knee/Hip Exercises: Standing   Heel Raises  Both;1  set;10 reps    Hip Flexion  Right;1 set;10 reps;Stengthening;Knee bent   3# weight 10 reps   Hip Abduction  Stengthening;Right;1 set;10 reps;Knee straight   3# weight    Hip Extension  Stengthening;Right;1 set;10 reps;Knee bent   3# weight used on RLE   Forward Step Up  Right;1 set;10 reps;Hand Hold: 1;Step Height: 6"      Ankle Exercises: Stretches   Gastroc Stretch  1 rep;30 seconds       Standing Rt knee flexion with 3# weight - 10 reps - hips blocked at counter to prevent hip flexion    Balance Exercises - 05/29/18 0903      Balance Exercises: Standing   Rockerboard  Anterior/posterior;EO;EC;10 reps   with UE support with EC inside // bars   Step Over Hurdles / Cones  Pt performed stepping over orange hurdle 10 reps each leg with min to CGA with min. UE support     Other Standing Exercises  Pt performed tap ups to 6" step with LLE for improved Rt knee control      Pt performed SLS activity - rolling medium sized blue ball forward/back and laterally 5 reps each leg for improved  SLS Performed tapping balance bubble forward, then sideways with Lt foot for improved Rt knee control - 5 reps each (forward, sideways)     PT Short Term Goals - 04/24/18 0935      PT SHORT TERM GOAL #1   Title  Improve Berg score from 26/56 to >/= 31/56 to reduce fall risk.    Baseline  9/20: 43/56     Time  4    Period  Weeks    Status  Achieved      PT SHORT TERM GOAL #2   Title  Improve TUG score from 18.09 secs without device to </= 15 secs without device for improved functional mobility and reduced fall risk.    Baseline  9/17: 16.72 secs without AD/AFO donned. Improved.     Time  4    Period  Weeks    Status  Not Met      PT SHORT TERM GOAL #3   Title  Pt will amb. 250' with RW with CGA on flat, even surface for incr. community accessibility.      Baseline  9/17: Pt amb. 250' with RW, Supervision on even surfaces.     Time  4    Period  Weeks    Status  Achieved      PT SHORT TERM GOAL #4   Title  Increase gait velocity from 1.53 ft/sec to >/= 1.9 ft/sec without device for incr. gait efficiency.    Baseline  9/17: 17.44secs = 1.1f./sec with no AD/ AFO donned; Improved.    Time  4    Period  Weeks    Status  Not Met      PT SHORT TERM GOAL #5   Title  Independent in HEP for RLE strengthening and balance exercises.    Baseline  9/20: Pt states she is independent with HEP.    Time  4    Period  Weeks    Status  Achieved        PT Long Term Goals - 05/23/18 1843      PT LONG TERM GOAL #1   Title  Pt will increase Berg score from 26/56 to >/= 36/56 to reduce fall risk.    Baseline  05/17/18: 47/56    Status  Achieved  Target Date  05/22/18      PT LONG TERM GOAL #2   Title  AFO consult for RLE will be obtained - and AFO for RLE obtained if determined to be needed.     Baseline  05/17/18: consult is set for 05/24/18    Status  On-going    Target Date  06/30/18      PT LONG TERM GOAL #3   Title  Pt will amb. 500' with RW with supervision on flat, even  surface for incr. community accessibility.    Status  Achieved      PT LONG TERM GOAL #4   Title  Improve TUG score from 18.09 secs to </= 13.5 secs without device to reduce fall risk.    Baseline  05/17/18: 13.35 sec's with no AD/brace used    Status  Achieved      PT LONG TERM GOAL #5   Title  Increase gait velocity from 1.53 ft/sec to >/= 2.2 ft/sec without device for incr. gait efficiency.     Baseline  05/17/18: 2.37 ft/sec no AD/brace used    Status  Achieved      Additional Long Term Goals   Additional Long Term Goals  Yes      PT LONG TERM GOAL #6   Title  Pt will be modified independent with household ambulation with use of Rt AFO.    Time  4    Period  Weeks    Status  New    Target Date  06/30/18      PT LONG TERM GOAL #7   Title  Pt will increase Berg balance test score from 47/56 to >/= 50/56 to reduce fall risk.    Baseline  47/56 on 05-17-18    Time  4    Period  Weeks    Status  New    Target Date  06/30/18      PT LONG TERM GOAL #8   Title  Negotiate 12 steps with 1 hand rail using a step over step sequence with ascension and step by step sequence with descension.     Time  4    Period  Weeks    Status  New    Target Date  06/30/18            Plan - 05/29/18 0900    Clinical Impression Statement  Pt demonstrating improved gait and balance with increased RLE strength; Rt knee continues to hyperextend and increases with fatigue    Rehab Potential  Good    Clinical Impairments Affecting Rehab Potential  chronicity of deficits - approx. 3 yrs since onset (due to transverse myelitis?)    PT Frequency  2x / week    PT Duration  8 weeks    PT Treatment/Interventions  ADLs/Self Care Home Management;DME Instruction;Gait training;Stair training;Orthotic Fit/Training;Patient/family education;Neuromuscular re-education;Balance training;Therapeutic exercise;Therapeutic activities;Passive range of motion    PT Next Visit Plan  continue RLE strengthening and gait  training    Recommended Other Services  Husband to have called [employer] to schedule appt for custom AFO for RLE    Consulted and Agree with Plan of Care  Patient       Patient will benefit from skilled therapeutic intervention in order to improve the following deficits and impairments:  Abnormal gait, Decreased activity tolerance, Decreased balance, Decreased coordination, Decreased strength, Impaired flexibility, Impaired tone, Impaired UE functional use, Pain  Visit Diagnosis: Other abnormalities of gait and mobility  Unsteadiness on feet  Muscle weakness (generalized)     Problem List Patient Active Problem List   Diagnosis Date Noted  . CAD (coronary artery disease) 09/04/2013  . HTN (hypertension) 09/04/2013  . HLD (hyperlipidemia) 09/04/2013  . Tobacco abuse 09/04/2013    Alda Lea, PT 05/29/2018, 9:10 AM  [facility] [address] [place], [place], [address] Phone: [phone]   Fax:  [phone]  Name: KRYSTALE RINKENBERGER MRN: [id number] Date of Birth: [birth year]

## 2018-05-31 ENCOUNTER — Ambulatory Visit: Payer: Medicare Other | Admitting: Occupational Therapy

## 2018-05-31 ENCOUNTER — Encounter: Payer: Self-pay | Admitting: Physical Therapy

## 2018-05-31 ENCOUNTER — Ambulatory Visit: Payer: Medicare Other | Admitting: Physical Therapy

## 2018-05-31 DIAGNOSIS — R2681 Unsteadiness on feet: Secondary | ICD-10-CM

## 2018-05-31 DIAGNOSIS — R2689 Other abnormalities of gait and mobility: Secondary | ICD-10-CM

## 2018-05-31 DIAGNOSIS — R278 Other lack of coordination: Secondary | ICD-10-CM | POA: Diagnosis not present

## 2018-05-31 DIAGNOSIS — M6281 Muscle weakness (generalized): Secondary | ICD-10-CM

## 2018-05-31 NOTE — Therapy (Signed)
[facility] [address] [place], [place], [address] Phone: [phone]   Fax:  [phone]  Occupational Therapy Treatment  Patient Details  Name: Kim Sutton MRN: [id number] Date of Birth: 1934/04/09 No data recorded  Encounter Date: 05/31/2018  OT End of Session - 05/31/18 0831    Visit Number  9    Number of Visits  17    Date for OT Re-Evaluation  06/25/18    Authorization Type  cert. period  04/26/18-07/24/18    Authorization Time Period  Medicare / [place] - Visit Number  9    Authorization - Number of Visits  10    OT Start Time  0802    OT Stop Time  0845    OT Time Calculation (min)  43 min       Past Medical History:  Diagnosis Date  . Adrenal cortical adenoma of left adrenal gland    stable since 2009;  MRI in 04/2010 - no further scans  . CAD (coronary artery disease)    a. s/p NSTEMI in 2001 tx with BMS to RCA  . Colon polyps   . HLD (hyperlipidemia)   . Hx of cardiovascular stress test    Lexiscan Myoview (09/2013):  No ischemia, EF 77%, Low Risk.  Marland Kitchen Hyperglycemia   . Hypertension   . Myoclonus    on neurontin per neuro at [facility]  . Nephrolithiasis   . Nodule of right lung    a. 46mm on CXR 10/2009;  CT 05/2010 ok - no further scans  . Tobacco abuse     Past Surgical History:  Procedure Laterality Date  . CHOLECYSTECTOMY    . CORONARY ANGIOPLASTY WITH STENT PLACEMENT    . TOTAL ABDOMINAL HYSTERECTOMY      There were no vitals filed for this visit.  Subjective Assessment - 05/31/18 0810    Pertinent History  PMH:  transverse myelitis, hx of shingles per husband; CAD; HTN, HLD, hard of hearing    Limitations  fall risk, hard of hearing    Patient Stated Goals  improve ability to pick up things, use RUE more for cleaning, strengthening    Currently in Pain?  No/denies                 treatment: supine, closed chain shoulder flex and chest press x15 each with  pvc pipe frame  In sitting, functional reaching to pick up  And stack coins coins  Then to place in container with in hand manipulation with mid-range functional reaching with min cueing for shoulder hike compensation.  In sitting, flipping cards with RUE incorporating shoulder ER, supination and finger ext then dealing cards with thumb with min v.c. For compensation.  Rotating ball in right hand mod v.c for increased fine motor coordination  Therapist issued coordination activities for home  Standing to place/ remove graded clothespins for sustained pinch and low range functional reach, mod v.c to avoid compensations.            OT Short Term Goals - 05/29/18 0944      OT SHORT TERM GOAL #1   Title  Pt will be independent with coordination, RUE strength HEP.--check STGs 05/25/18    Time  4    Period  Weeks    Status  Achieved      OT SHORT TERM GOAL #2   Title  Pt will improve RUE functional use for eating as shown by  improving PPT#2 by at least 5sec.    Baseline  23.34sec    Time  4    Period  Weeks    Status  Achieved   16.0 secs     OT SHORT TERM GOAL #3   Title  Pt will improve coordination for ADLs as shown by improving time on 9-hole peg test by at least 10sec.    Baseline  59.38sec    Time  4    Period  Weeks    Status  Achieved   05/22/2018  37.69     OT SHORT TERM GOAL #4   Title  Pt will improve R grip strength for opening containers/lifting objects by at least 5lbs.    Baseline  12lbs    Time  4    Period  Weeks    Status  Achieved   05/29/18:  22lbs     OT SHORT TERM GOAL #5   Title  Pt will be able to demo at least 85* R shoulder flex for functional reaching.    Baseline  75*    Time  8    Period  Weeks    Status  On-going   05/19/18 80*       OT Long Term Goals - 05/22/18 1209      OT LONG TERM GOAL #1   Title  Pt will be able to cut food mod I using AE prn.    Time  8    Period  Weeks    Status  On-going      OT LONG TERM GOAL  #2   Title  Pt will be able to use RUE to eat with at least 50% of meal.    Time  8    Period  Weeks    Status  On-going      OT LONG TERM GOAL #3   Title  Pt will improve RUE functional reaching/coordination for ADLs as shown by improving score on box and blocks test by at least 8.    Baseline  22    Time  8    Period  Weeks    Status  On-going      OT LONG TERM GOAL #4   Title  Pt will improve R grip strength for opening containers/lifting objects by at least 10lbs.    Baseline  12lbs    Period  Weeks    Status  On-going      OT LONG TERM GOAL #5   Title  Pt will be able to use RUE for cleaning/home maintenance tasks for at least 42min prior to rest break.    Time  8    Period  Weeks    Status  On-going      OT LONG TERM GOAL #6   Title  Pt will be able to demo at least 95* R shoulder flex for functional reaching.    Baseline  75*    Time  8    Period  Weeks    Status  On-going            Plan - 05/31/18 1228    Clinical Impression Statement  Pt demo improving functional reach with less compensation during functional activities    Occupational Profile and client history currently impacting functional performance  Pt is now limited in IADL performance, particularly when using dominant RUE.  Pt has to switch to nondominant LUE during ADLs/IADLs.      Occupational performance deficits (Please refer  to evaluation for details):  ADL's;IADL's;Leisure;Social Participation    Rehab Potential  Good    OT Frequency  2x / week    OT Duration  8 weeks    OT Treatment/Interventions  Self-care/ADL training;Therapeutic exercise;Patient/family education;Neuromuscular education;Moist Heat;Fluidtherapy;Energy conservation;Therapist, nutritional;Therapeutic activities;Balance training;Passive range of motion;Manual Therapy;DME and/or AE instruction;Cryotherapy    Plan  continue with neuro re-ed, RUE functional use, coordination, strength    Consulted and Agree with Plan of Care   Patient;Family member/caregiver    Family Member Consulted  husband       Patient will benefit from skilled therapeutic intervention in order to improve the following deficits and impairments:     Visit Diagnosis: Other lack of coordination  Muscle weakness (generalized)  Unsteadiness on feet    Problem List Patient Active Problem List   Diagnosis Date Noted  . CAD (coronary artery disease) 09/04/2013  . HTN (hypertension) 09/04/2013  . HLD (hyperlipidemia) 09/04/2013  . Tobacco abuse 09/04/2013    Karrissa Parchment 05/31/2018, 12:29 PM  [facility] [address] [place], [place], [address] Phone: [phone]   Fax:  [phone]  Name: Kim Sutton MRN: [id number] Date of Birth: 1934/07/15

## 2018-05-31 NOTE — Therapy (Signed)
[facility] [address] [place], [place], [address] Phone: [phone]   Fax:  [phone]  Physical Therapy Treatment  Patient Details  Name: Kim Sutton MRN: [id number] Date of Birth: [birth year] Referring Provider (PT): Dr. Lajean Manes   Encounter Date: 05/31/2018  PT End of Session - 05/31/18 1001    Visit Number  19    Number of Visits  23    Date for PT Re-Evaluation  06/22/18    Authorization Type  Medicare and AARP    Authorization Time Period  03-22-18 - 06-20-18; 05-22-18 - 07-31-18    PT Start Time  0848    PT Stop Time  0930    PT Time Calculation (min)  42 min    Equipment Utilized During Treatment  Gait belt    Activity Tolerance  Patient tolerated treatment well    Behavior During Therapy  [facility] for tasks assessed/performed       Past Medical History:  Diagnosis Date  . Adrenal cortical adenoma of left adrenal gland    stable since 2009;  MRI in 04/2010 - no further scans  . CAD (coronary artery disease)    a. s/p NSTEMI in 2001 tx with BMS to RCA  . Colon polyps   . HLD (hyperlipidemia)   . Hx of cardiovascular stress test    Lexiscan Myoview (09/2013):  No ischemia, EF 77%, Low Risk.  Marland Kitchen Hyperglycemia   . Hypertension   . Myoclonus    on neurontin per neuro at [facility]  . Nephrolithiasis   . Nodule of right lung    a. 52m on CXR 10/2009;  CT 05/2010 ok - no further scans  . Tobacco abuse     Past Surgical History:  Procedure Laterality Date  . CHOLECYSTECTOMY    . CORONARY ANGIOPLASTY WITH STENT PLACEMENT    . TOTAL ABDOMINAL HYSTERECTOMY      There were no vitals filed for this visit.  Subjective Assessment - 05/31/18 0851    Subjective  States she is tired from staying up to watch a ball game last night. She is going today to get fitted for brace.    Patient is accompained by:  Family member    Pertinent History  h/o transverse myelitis;  (husband reports Rt sided weakness is due to  pt having had lesion on spine due to shingles approx. 3 yrs ago):  CAD; HTN    Patient Stated Goals  "get up and walk some place"    Currently in Pain?  No/denies                       [facility] - [facility] 0854      Transfers   Transfers  Sit to Stand    Sit to Stand  5: Supervision;Without upper extremity assist;From bed;From chair/3-in-1   staggered stance, right ahead of left    Stand to Sit  5: Supervision;To bed;To chair/3-in-1;With upper extremity assist;Without upper extremity assist   Pt required UE support after a couple reps due to fatigue.    Number of Reps  Other reps (comment)   5   Comments  --      Ambulation/Gait   Ambulation/Gait  Yes    Ambulation/Gait Assistance  5: Supervision    Ambulation Distance (Feet)  230 Feet    Assistive device  None    Gait Pattern  Decreased hip/knee flexion - right;Decreased dorsiflexion - right;Decreased weight  shift to right;Right genu recurvatum;Lateral trunk lean to right;Decreased dorsiflexion - left   cues to slow cadence for safety    Ambulation Surface  Level;Indoor    Gait Comments  Verbal cues for correct posture, foward head gaze, and to inc hip flexion for LE clearance.       High Level Balance   High Level Balance Activities  Marching forwards;Backward walking;Negotiating over obstacles    High Level Balance Comments  In parallel bars: Stepping over small black foam obstacle leading with right foot; toe taps on raised platform x10 reps, marching forward and backwards walking on red mat; lat weight shifts x30 sec; staggered stance weight shifts with slight knee flexion x 30 bilaterally. Verbal cues for correct posture and to increase step height for safe clearance of RLE. Required 2 seated rest breaks.          Exercises   Exercises  Knee/Hip;Ankle                Knee/Hip Exercises: Standing   Heel Raises  --    Hip Flexion  --    Hip Abduction  --    Hip Extension  --     Forward Step Up  Right;Left;1 set;10 reps;Hand Hold: 1;Step Height: 6"    Other Standing Knee Exercises  Mini squats x2 sets 10 reps. Verbal cues for correct exercise technique and equal weight bearing.                     PT Short Term Goals - 04/24/18 0935      PT SHORT TERM GOAL #1   Title  Improve Berg score from 26/56 to >/= 31/56 to reduce fall risk.    Baseline  9/20: 43/56     Time  4    Period  Weeks    Status  Achieved      PT SHORT TERM GOAL #2   Title  Improve TUG score from 18.09 secs without device to </= 15 secs without device for improved functional mobility and reduced fall risk.    Baseline  9/17: 16.72 secs without AD/AFO donned. Improved.     Time  4    Period  Weeks    Status  Not Met      PT SHORT TERM GOAL #3   Title  Pt will amb. 250' with RW with CGA on flat, even surface for incr. community accessibility.      Baseline  9/17: Pt amb. 250' with RW, Supervision on even surfaces.     Time  4    Period  Weeks    Status  Achieved      PT SHORT TERM GOAL #4   Title  Increase gait velocity from 1.53 ft/sec to >/= 1.9 ft/sec without device for incr. gait efficiency.    Baseline  9/17: 17.44secs = 1.31f./sec with no AD/ AFO donned; Improved.    Time  4    Period  Weeks    Status  Not Met      PT SHORT TERM GOAL #5   Title  Independent in HEP for RLE strengthening and balance exercises.    Baseline  9/20: Pt states she is independent with HEP.    Time  4    Period  Weeks    Status  Achieved        PT Long Term Goals - 05/23/18 1843      PT LONG TERM GOAL #1   Title  Pt will increase Berg score from 26/56 to >/= 36/56 to reduce fall risk.    Baseline  05/17/18: 47/56    Status  Achieved    Target Date  05/22/18      PT LONG TERM GOAL #2   Title  AFO consult for RLE will be obtained - and AFO for RLE obtained if determined to be needed.     Baseline  05/17/18: consult is set for 05/24/18    Status  On-going    Target Date  06/30/18       PT LONG TERM GOAL #3   Title  Pt will amb. 500' with RW with supervision on flat, even surface for incr. community accessibility.    Status  Achieved      PT LONG TERM GOAL #4   Title  Improve TUG score from 18.09 secs to </= 13.5 secs without device to reduce fall risk.    Baseline  05/17/18: 13.35 sec's with no AD/brace used    Status  Achieved      PT LONG TERM GOAL #5   Title  Increase gait velocity from 1.53 ft/sec to >/= 2.2 ft/sec without device for incr. gait efficiency.     Baseline  05/17/18: 2.37 ft/sec no AD/brace used    Status  Achieved      Additional Long Term Goals   Additional Long Term Goals  Yes      PT LONG TERM GOAL #6   Title  Pt will be modified independent with household ambulation with use of Rt AFO.    Time  4    Period  Weeks    Status  New    Target Date  06/30/18      PT LONG TERM GOAL #7   Title  Pt will increase Berg balance test score from 47/56 to >/= 50/56 to reduce fall risk.    Baseline  47/56 on 05-17-18    Time  4    Period  Weeks    Status  New    Target Date  06/30/18      PT LONG TERM GOAL #8   Title  Negotiate 12 steps with 1 hand rail using a step over step sequence with ascension and step by step sequence with descension.     Time  4    Period  Weeks    Status  New    Target Date  06/30/18            Plan - 05/31/18 1003    Clinical Impression Statement  Today's session focused on gait, balance, and functional LE stengthening. RLE continues to fatigue quickly and has poor clearance with balance and strength exercises. Pt requires supervison to min a for higher level balance with verbal cues for safety, posture, and RLE clearance. Pt is going today after session to get fitted for brace. Pt would benefit from further PT to continue progression towards goals.      Rehab Potential  Good    Clinical Impairments Affecting Rehab Potential  chronicity of deficits - approx. 3 yrs since onset (due to transverse myelitis?)    PT  Frequency  2x / week    PT Duration  8 weeks    PT Treatment/Interventions  ADLs/Self Care Home Management;DME Instruction;Gait training;Stair training;Orthotic Fit/Training;Patient/family education;Neuromuscular re-education;Balance training;Therapeutic exercise;Therapeutic activities;Passive range of motion    PT Next Visit Plan  continue RLE strengthening and gait training    Consulted and Agree with Plan of Care  Patient       Patient will benefit from skilled therapeutic intervention in order to improve the following deficits and impairments:  Abnormal gait, Decreased activity tolerance, Decreased balance, Decreased coordination, Decreased strength, Impaired flexibility, Impaired tone, Impaired UE functional use, Pain  Visit Diagnosis: Other abnormalities of gait and mobility  Unsteadiness on feet  Muscle weakness (generalized)     Problem List Patient Active Problem List   Diagnosis Date Noted  . CAD (coronary artery disease) 09/04/2013  . HTN (hypertension) 09/04/2013  . HLD (hyperlipidemia) 09/04/2013  . Tobacco abuse 09/04/2013    Cecile Sheerer, SPTA 05/31/2018, 10:18 AM  [facility] [address] [place], [place], [address] Phone: [phone]   Fax:  [phone]  Name: Kim Sutton MRN: [id number] Date of Birth: 1934/04/10

## 2018-05-31 NOTE — Patient Instructions (Signed)
  Coordination Activities  Perform the following activities for 20 minutes 1 times per day with right hand(s).   Rotate ball in fingertips (clockwise and counter-clockwise).  Flip cards 1 at a time as fast as you can.  Deal cards with your thumb (Hold deck in hand and push card off top with thumb).  Pick up coins and stack.  Pick up coins one at a time until you get 5-10 in your hand, then move coins from palm to fingertips to stack one at a time.

## 2018-06-01 ENCOUNTER — Ambulatory Visit: Payer: Medicare Other | Attending: Geriatric Medicine | Admitting: Physical Therapy

## 2018-06-01 ENCOUNTER — Encounter: Payer: Self-pay | Admitting: Physical Therapy

## 2018-06-01 DIAGNOSIS — R2681 Unsteadiness on feet: Secondary | ICD-10-CM | POA: Diagnosis not present

## 2018-06-01 DIAGNOSIS — M542 Cervicalgia: Secondary | ICD-10-CM

## 2018-06-01 DIAGNOSIS — R278 Other lack of coordination: Secondary | ICD-10-CM | POA: Diagnosis not present

## 2018-06-01 DIAGNOSIS — M6281 Muscle weakness (generalized): Secondary | ICD-10-CM | POA: Diagnosis not present

## 2018-06-01 DIAGNOSIS — R293 Abnormal posture: Secondary | ICD-10-CM | POA: Diagnosis not present

## 2018-06-01 DIAGNOSIS — R2689 Other abnormalities of gait and mobility: Secondary | ICD-10-CM

## 2018-06-01 NOTE — Therapy (Signed)
[facility] [address] [place], [place], [address] Phone: [phone]   Fax:  [phone]  Physical Therapy Re-Evaluation  Patient Details  Name: Kim Sutton MRN: [id number] Date of Birth: 1934/03/05 Referring Provider (PT): Dr. Lajean Manes   Encounter Date: 06/01/2018  PT End of Session - 06/01/18 0958    Visit Number  20    Number of Visits  32    Date for PT Re-Evaluation  07/20/18    Authorization Type  Medicare and [employer]    Authorization Time Period  03-22-18 - 06-20-18; 05-22-18 - 07-31-18    PT Start Time  0847    PT Stop Time  0930    PT Time Calculation (min)  43 min    Activity Tolerance  Patient tolerated treatment well    Behavior During Therapy  [facility] for tasks assessed/performed       Past Medical History:  Diagnosis Date  . Adrenal cortical adenoma of left adrenal gland    stable since 2009;  MRI in 04/2010 - no further scans  . CAD (coronary artery disease)    a. s/p NSTEMI in 2001 tx with BMS to RCA  . Colon polyps   . HLD (hyperlipidemia)   . Hx of cardiovascular stress test    Lexiscan Myoview (09/2013):  No ischemia, EF 77%, Low Risk.  Marland Kitchen Hyperglycemia   . Hypertension   . Myoclonus    on neurontin per neuro at [facility]  . Nephrolithiasis   . Nodule of right lung    a. 33m on CXR 10/2009;  CT 05/2010 ok - no further scans  . Tobacco abuse     Past Surgical History:  Procedure Laterality Date  . CHOLECYSTECTOMY    . CORONARY ANGIOPLASTY WITH STENT PLACEMENT    . TOTAL ABDOMINAL HYSTERECTOMY      There were no vitals filed for this visit.   Subjective Assessment - 06/01/18 0852    Subjective  patient reporting a history of headaches - seen by PCP - had xrays with noted arthritis. Pain at L posterior neck. Pain is typically constant - more so when fatigues. Pain is reduced with tylenol.     Patient is accompained by:  Family member    Pertinent History  h/o transverse myelitis;   (husband reports Rt sided weakness is due to pt having had lesion on spine due to shingles approx. 3 yrs ago):  CAD; HTN    Patient Stated Goals  "get up and walk some place" "reduce neck pain"    Currently in Pain?  Yes    Pain Score  2     Pain Location  Neck    Pain Orientation  Left;Posterior    Pain Descriptors / Indicators  Tender    Pain Type  Chronic pain    Pain Onset  More than a month ago    Pain Frequency  Constant    Aggravating Factors   fatigue    Pain Relieving Factors  tylenol         [facility] PT Assessment - 06/01/18 0856      Assessment   Medical Diagnosis  R hemiparesis; cervical spine arthritis    Referring Provider (PT)  Dr. HLajean Manes   Prior Therapy  current POC for falls/balance      Precautions   Precautions  Fall      Restrictions   Weight Bearing Restrictions  No      Balance Screen  Has the patient fallen in the past 6 months  No    Has the patient had a decrease in activity level because of a fear of falling?   No    Is the patient reluctant to leave their home because of a fear of falling?   No      Home Environment   Living Environment  Private residence    Living Arrangements  Spouse/significant other    Type of [place] to enter    Entrance Stairs-Number of Steps  3    Entrance Stairs-Rails  Can reach both    [place] to live on main level with bedroom/bathroom    [place] - 2 wheels      Cognition   Overall Cognitive Status  Within Functional Limits for tasks assessed      Posture/Postural Control   Posture/Postural Control  Postural limitations    Postural Limitations  Rounded Shoulders;Forward head   L lateral flexion   Posture Comments  inability to maintain midline and self correct without cueing      ROM / Strength   AROM / PROM / Strength  AROM;PROM;Strength      AROM   Overall AROM   Deficits    AROM Assessment Site  Cervical    Cervical Flexion  31    Cervical  Extension  29    Cervical - Right Side Bend  10    Cervical - Left Side Bend  39   starts in a L lateral flexion posture   Cervical - Right Rotation  WNL    Cervical - Left Rotation  WNL      PROM   Overall PROM   Deficits    PROM Assessment Site  Cervical    Cervical - Right Side Bend  restricted and painful    Cervical - Left Side Bend  restricted and painful      Strength   Overall Strength Comments  LUE grossly WNL, RUE shoulder strength grossly 3 to 3+/5      Palpation   Palpation comment  TTP at B UT, B cervical paraspinals, surrounding musculature                Objective measurements completed on examination: See above findings.      [facility] Adult PT Treatment/Exercise - 06/01/18 0856      Exercises   Exercises  Neck      Neck Exercises: Seated   Neck Retraction  5 reps;5 secs    Neck Retraction Limitations  difficulty    Cervical Rotation  Both;10 reps    Cervical Rotation Limitations  to comfortable end range    Other Seated Exercise  scap retraction 10 x 5 sec hold      Neck Exercises: Stretches   Upper Trapezius Stretch  Left;2 reps;30 seconds               PT Short Term Goals - 04/24/18 0935      PT SHORT TERM GOAL #1   Title  Improve Berg score from 26/56 to >/= 31/56 to reduce fall risk.    Baseline  9/20: 43/56     Time  4    Period  Weeks    Status  Achieved      PT SHORT TERM GOAL #2   Title  Improve TUG score from 18.09 secs without device to </= 15 secs without device  for improved functional mobility and reduced fall risk.    Baseline  9/17: 16.72 secs without AD/AFO donned. Improved.     Time  4    Period  Weeks    Status  Not Met      PT SHORT TERM GOAL #3   Title  Pt will amb. 250' with RW with CGA on flat, even surface for incr. community accessibility.      Baseline  9/17: Pt amb. 250' with RW, Supervision on even surfaces.     Time  4    Period  Weeks    Status  Achieved      PT SHORT TERM GOAL #4   Title   Increase gait velocity from 1.53 ft/sec to >/= 1.9 ft/sec without device for incr. gait efficiency.    Baseline  9/17: 17.44secs = 1.29f./sec with no AD/ AFO donned; Improved.    Time  4    Period  Weeks    Status  Not Met      PT SHORT TERM GOAL #5   Title  Independent in HEP for RLE strengthening and balance exercises.    Baseline  9/20: Pt states she is independent with HEP.    Time  4    Period  Weeks    Status  Achieved        PT Long Term Goals - 06/01/18 1008      PT LONG TERM GOAL #1   Title  Pt will increase Berg score from 26/56 to >/= 36/56 to reduce fall risk.    Baseline  05/17/18: 47/56    Status  Achieved      PT LONG TERM GOAL #2   Title  AFO consult for RLE will be obtained - and AFO for RLE obtained if determined to be needed.     Baseline  05/17/18: consult is set for 05/24/18    Status  On-going      PT LONG TERM GOAL #3   Title  Pt will amb. 500' with RW with supervision on flat, even surface for incr. community accessibility.    Status  Achieved      PT LONG TERM GOAL #4   Title  Improve TUG score from 18.09 secs to </= 13.5 secs without device to reduce fall risk.    Baseline  05/17/18: 13.35 sec's with no AD/brace used    Status  Achieved      PT LONG TERM GOAL #5   Title  Increase gait velocity from 1.53 ft/sec to >/= 2.2 ft/sec without device for incr. gait efficiency.     Baseline  05/17/18: 2.37 ft/sec no AD/brace used    Status  Achieved      Additional Long Term Goals   Additional Long Term Goals  Yes      PT LONG TERM GOAL #6   Title  Pt will be modified independent with household ambulation with use of Rt AFO.    Time  4    Period  Weeks    Status  New      PT LONG TERM GOAL #7   Title  Pt will increase Berg balance test score from 47/56 to >/= 50/56 to reduce fall risk.    Baseline  47/56 on 05-17-18    Time  4    Period  Weeks    Status  New      PT LONG TERM GOAL #8   Title  Negotiate 12 steps with 1 hand  rail using a step  over step sequence with ascension and step by step sequence with descension.     Time  4    Period  Weeks    Status  New      PT LONG TERM GOAL  #9   TITLE  Patient to demonstate appropraite posturing with ability to self-correcct 50% of the time for reduced stress/strain on neck and upper back musculature    Time  4    Period  Weeks    Status  New    Target Date  07/20/18      PT LONG TERM GOAL  #10   TITLE  Patient to improve cervical AROM to [facility] without pain provocation    Time  4    Period  Weeks    Status  New    Target Date  07/20/18      PT LONG TERM GOAL  #11   TITLE  Patient to report reduction in headache frequency and general neck pain by >/= 50%    Time  4    Period  Weeks    Status  New    Target Date  07/20/18             Plan - 06/01/18 1000    Clinical Impression Statement  Patient seen in this clinic from 05/03/18 - 05/31/18 with POC focusing on balance and fall prevention. Patient presenting today with new referral for cervical spine arthritis. Patient today demosntating altered posturing with reduced self-awareness and inability to self correct without cueing (verbal and tactile). Patient with limited cervical AROM, PROM, as well as TTP at B UT, B cervical paraspinals, and surrounding musculature. Patient to benefit from skilled PT to address pain and posturing for improved mechanics and hopeful reduced pain. PT providing HEP handout and education on gentle cervical ROM, stretching, and strengthening activities for mid back. Husband present for carryover.     Rehab Potential  Good    Clinical Impairments Affecting Rehab Potential  chronicity of deficits - approx. 3 yrs since onset (due to transverse myelitis?)    PT Frequency  2x / week    PT Duration  8 weeks    PT Treatment/Interventions  ADLs/Self Care Home Management;DME Instruction;Gait training;Stair training;Orthotic Fit/Training;Patient/family education;Neuromuscular re-education;Balance  training;Therapeutic exercise;Therapeutic activities;Passive range of motion;Moist Heat;Traction;Dry needling;Taping;Manual techniques;Spinal Manipulations;Joint Manipulations    PT Next Visit Plan  continue RLE strengthening and gait training; postural training, cervical manual prn, stretching    PT Home Exercise Plan  [id number] - cervical    Consulted and Agree with Plan of Care  Patient;Family member/caregiver    Family Member Consulted  spouse - Kim Sutton       Patient will benefit from skilled therapeutic intervention in order to improve the following deficits and impairments:  Abnormal gait, Decreased activity tolerance, Decreased balance, Decreased coordination, Decreased strength, Impaired flexibility, Impaired tone, Impaired UE functional use, Pain, Postural dysfunction, Improper body mechanics, Decreased range of motion, Increased fascial restricitons  Visit Diagnosis: Muscle weakness (generalized)  Unsteadiness on feet  Other abnormalities of gait and mobility  Cervicalgia  Abnormal posture     Problem List Patient Active Problem List   Diagnosis Date Noted  . CAD (coronary artery disease) 09/04/2013  . HTN (hypertension) 09/04/2013  . HLD (hyperlipidemia) 09/04/2013  . Tobacco abuse 09/04/2013    Lanney Gins, PT, DPT Supplemental Physical Therapist 06/01/18 10:25 AM Pager: [phone] Office: [place]  [place], [place], [address] Phone: [phone]   Fax:  [phone]  Name: Kim Sutton MRN: [id number] Date of Birth: 1933/10/13

## 2018-06-05 ENCOUNTER — Encounter: Payer: Medicare Other | Admitting: Occupational Therapy

## 2018-06-05 ENCOUNTER — Ambulatory Visit: Payer: Self-pay

## 2018-06-07 ENCOUNTER — Ambulatory Visit: Payer: Medicare Other | Admitting: Physical Therapy

## 2018-06-07 ENCOUNTER — Ambulatory Visit: Payer: Medicare Other | Admitting: Occupational Therapy

## 2018-06-07 ENCOUNTER — Encounter: Payer: Self-pay | Admitting: Occupational Therapy

## 2018-06-07 DIAGNOSIS — M542 Cervicalgia: Secondary | ICD-10-CM

## 2018-06-07 DIAGNOSIS — R2689 Other abnormalities of gait and mobility: Secondary | ICD-10-CM | POA: Diagnosis not present

## 2018-06-07 DIAGNOSIS — R278 Other lack of coordination: Secondary | ICD-10-CM

## 2018-06-07 DIAGNOSIS — M6281 Muscle weakness (generalized): Secondary | ICD-10-CM

## 2018-06-07 DIAGNOSIS — R293 Abnormal posture: Secondary | ICD-10-CM

## 2018-06-07 DIAGNOSIS — R2681 Unsteadiness on feet: Secondary | ICD-10-CM

## 2018-06-07 NOTE — Therapy (Signed)
[facility] [address] [place], [place], [address] Phone: [phone]   Fax:  [phone]  Occupational Therapy Treatment  Patient Details  Name: Kim Sutton MRN: [id number] Date of Birth: Mar 03, 1934 No data recorded  Encounter Date: 06/07/2018  OT End of Session - 06/07/18 0900    Visit Number  10    Number of Visits  17    Date for OT Re-Evaluation  06/25/18    Authorization Type  cert. period  04/26/18-07/24/18    Authorization Time Period  Medicare / [place] - Visit Number  10    Authorization - Number of Visits  10    OT Start Time  [phone]    OT Stop Time  1015    OT Time Calculation (min)  42 min    Activity Tolerance  Patient tolerated treatment well    Behavior During Therapy  WFL for tasks assessed/performed       Past Medical History:  Diagnosis Date  . Adrenal cortical adenoma of left adrenal gland    stable since 2009;  MRI in 04/2010 - no further scans  . CAD (coronary artery disease)    a. s/p NSTEMI in 2001 tx with BMS to RCA  . Colon polyps   . HLD (hyperlipidemia)   . Hx of cardiovascular stress test    Lexiscan Myoview (09/2013):  No ischemia, EF 77%, Low Risk.  Marland Kitchen Hyperglycemia   . Hypertension   . Myoclonus    on neurontin per neuro at [facility]  . Nephrolithiasis   . Nodule of right lung    a. 63mm on CXR 10/2009;  CT 05/2010 ok - no further scans  . Tobacco abuse     Past Surgical History:  Procedure Laterality Date  . CHOLECYSTECTOMY    . CORONARY ANGIOPLASTY WITH STENT PLACEMENT    . TOTAL ABDOMINAL HYSTERECTOMY      There were no vitals filed for this visit.  Subjective Assessment - 06/07/18 0859    Subjective   reaction from flu shot since last session (been in bed).  Pt/husband reports that pt is using RUE more for eating and that husband has had to assist in cutting food lately (was able to cut steak last night with incr time/difficulty)    Pertinent History  PMH:   transverse myelitis, hx of shingles per husband; CAD; HTN, HLD, hard of hearing    Limitations  fall risk, hard of hearing    Patient Stated Goals  improve ability to pick up things, use RUE more for cleaning, strengthening    Currently in Pain?  No/denies       Placing small pegs in pegboard to copy design with min cueing for trunk/shoulder compensation.  Removing with in-hand manipulation with min-mod difficulty.   In supine, closed chain shoulder flex and chest press x10 each with ball.  Then 2nd set x8 each (due to fatigue).  Removing small pegs from red putty for incr hand strength with min-mod cueing for compensation patterns.  1 rest break while competed other activities and then resumed to complete x13 pegs.  AAROM shoulder movement/light wt. Bearing to wipe table in circles with RUE in standing.  Pt fatigued quickly.  In standing, functional reaching to place checkers in connect 4 slots with mod cueing for shoulder/trunk/neck compensation.    Arm bike x61min level 1 for reciprocal movement, min v.c. For compensation/head position.       OT Short Term  Goals - 05/29/18 0944      OT SHORT TERM GOAL #1   Title  Pt will be independent with coordination, RUE strength HEP.--check STGs 05/25/18    Time  4    Period  Weeks    Status  Achieved      OT SHORT TERM GOAL #2   Title  Pt will improve RUE functional use for eating as shown by improving PPT#2 by at least 5sec.    Baseline  23.34sec    Time  4    Period  Weeks    Status  Achieved   16.0 secs     OT SHORT TERM GOAL #3   Title  Pt will improve coordination for ADLs as shown by improving time on 9-hole peg test by at least 10sec.    Baseline  59.38sec    Time  4    Period  Weeks    Status  Achieved   05/22/2018  37.69     OT SHORT TERM GOAL #4   Title  Pt will improve R grip strength for opening containers/lifting objects by at least 5lbs.    Baseline  12lbs    Time  4    Period  Weeks    Status  Achieved    05/29/18:  22lbs     OT SHORT TERM GOAL #5   Title  Pt will be able to demo at least 85* R shoulder flex for functional reaching.    Baseline  75*    Time  8    Period  Weeks    Status  On-going   05/19/18 80*       OT Long Term Goals - 06/07/18 0952      OT LONG TERM GOAL #1   Title  Pt will be able to cut food mod I using AE prn.    Time  8    Period  Weeks    Status  Achieved   06/07/18     OT LONG TERM GOAL #2   Title  Pt will be able to use RUE to eat with at least 50% of meal.    Time  8    Period  Weeks    Status  Achieved   06/07/18:  75% per pt/husband      OT LONG TERM GOAL #3   Title  Pt will improve RUE functional reaching/coordination for ADLs as shown by improving score on box and blocks test by at least 8.    Baseline  22    Time  8    Period  Weeks    Status  On-going      OT LONG TERM GOAL #4   Title  Pt will improve R grip strength for opening containers/lifting objects by at least 10lbs.    Baseline  12lbs    Period  Weeks    Status  On-going      OT LONG TERM GOAL #5   Title  Pt will be able to use RUE for cleaning/home maintenance tasks for at least 42min prior to rest break.    Time  8    Period  Weeks    Status  On-going      OT LONG TERM GOAL #6   Title  Pt will be able to demo at least 95* R shoulder flex for functional reaching.    Baseline  75*    Time  8    Period  Weeks  Status  On-going            Plan - 06/07/18 0900    Clinical Impression Statement  Pt demo improving functional reach with less compensation during functional activities and incr RUE functional use.  Pt reports incr ability to eat with RUE and is progressing well.  Pt would benefit from continued occupational therapy to improve RUE functional use, strength, and coordination for incr ease/participation with ADLs/IADLs.  Reporting period:  04/26/18-06/07/18.    Occupational Profile and client history currently impacting functional performance  Pt is now limited  in IADL performance, particularly when using dominant RUE.  Pt has to switch to nondominant LUE during ADLs/IADLs.      Occupational performance deficits (Please refer to evaluation for details):  ADL's;IADL's;Leisure;Social Participation    Rehab Potential  Good    OT Frequency  2x / week    OT Duration  8 weeks    OT Treatment/Interventions  Self-care/ADL training;Therapeutic exercise;Patient/family education;Neuromuscular education;Moist Heat;Fluidtherapy;Energy conservation;Therapist, nutritional;Therapeutic activities;Balance training;Passive range of motion;Manual Therapy;DME and/or AE instruction;Cryotherapy    Plan  continue with neuro re-ed, RUE functional use, coordination, strength    OT Home Exercise Plan  Education provided:  coordination HEP, putty HEP, ball HEP    Consulted and Agree with Plan of Care  Patient;Family member/caregiver    Family Member Consulted  husband       Patient will benefit from skilled therapeutic intervention in order to improve the following deficits and impairments:  Decreased knowledge of use of DME, Decreased coordination, Decreased mobility, Decreased strength, Decreased range of motion, Decreased endurance, Decreased activity tolerance, Decreased balance, Impaired UE functional use  Visit Diagnosis: Other lack of coordination  Unsteadiness on feet  Abnormal posture  Muscle weakness (generalized)  Other abnormalities of gait and mobility    Problem List Patient Active Problem List   Diagnosis Date Noted  . CAD (coronary artery disease) 09/04/2013  . HTN (hypertension) 09/04/2013  . HLD (hyperlipidemia) 09/04/2013  . Tobacco abuse 09/04/2013    [facility] [place] [facility] 06/07/2018, 10:30 AM  [facility] [address] [place], [place], [address] Phone: [phone]   Fax:  [phone]  Name: Kim Sutton MRN: [id number] Date of Birth: April 22, 1934   Vianne Bulls,  OTR/L [facility] [address] [place], [place]  [address] [phone] phone [phone] 06/07/18 10:30 AM

## 2018-06-08 NOTE — Patient Instructions (Signed)
Upper Cervical Rotation    Rotate head slowly from side to side as if saying "no". Do not turn head completely to either side. Keep motion small. Repeat __3_ times per set. Do _1___ sets per session. Do _2___ sessions per day.  USE TOWEL around neck - gently pull towel along jaw line with top hand to provide gentle overpressure for stretch with rotation - hold 30 secs  http://orth.exer.us/375   Copyright  VHI. All rights reserved.

## 2018-06-08 NOTE — Therapy (Signed)
[facility] [address] [place], [place], [address] Phone: [phone]   Fax:  [phone]  Physical Therapy Treatment  Patient Details  Name: Kim Sutton MRN: [id number] Date of Birth: [birth year] Referring Provider (PT): Dr. Lajean Manes   Encounter Date: 06/07/2018  PT End of Session - 06/08/18 1754    Visit Number  21    Number of Visits  32    Date for PT Re-Evaluation  07/20/18    Authorization Type  Medicare and [employer]    Authorization Time Period  03-22-18 - 06-20-18; 05-22-18 - 07-31-18    PT Start Time  0846    PT Stop Time  0930    PT Time Calculation (min)  44 min       Past Medical History:  Diagnosis Date  . Adrenal cortical adenoma of left adrenal gland    stable since 2009;  MRI in 04/2010 - no further scans  . CAD (coronary artery disease)    a. s/p NSTEMI in 2001 tx with BMS to RCA  . Colon polyps   . HLD (hyperlipidemia)   . Hx of cardiovascular stress test    Lexiscan Myoview (09/2013):  No ischemia, EF 77%, Low Risk.  Marland Kitchen Hyperglycemia   . Hypertension   . Myoclonus    on neurontin per neuro at [facility]  . Nephrolithiasis   . Nodule of right lung    a. 65m on CXR 10/2009;  CT 05/2010 ok - no further scans  . Tobacco abuse     Past Surgical History:  Procedure Laterality Date  . CHOLECYSTECTOMY    . CORONARY ANGIOPLASTY WITH STENT PLACEMENT    . TOTAL ABDOMINAL HYSTERECTOMY      There were no vitals filed for this visit.  Subjective Assessment - 06/08/18 1711    Subjective  Pt states she received flu shot which made her sick; was in bed almost all weekend due to not feeling well; has not done exercises due to having been sick    Patient is accompained by:  Family member   spouse - [name]  Pertinent History  h/o transverse myelitis;  (husband reports Rt sided weakness is due to pt having had lesion on spine due to shingles approx. 3 yrs ago):  CAD; HTN    Patient Stated Goals  "get up  and walk some place" "reduce neck pain"    Currently in Pain?  No/denies                       [facility] Adult PT Treatment/Exercise - 06/08/18 0001      Ambulation/Gait   Ambulation/Gait  Yes    Ambulation/Gait Assistance  4: Min guard    Ambulation/Gait Assistance Details  cues for step length and to control Rt knee hyperextension    Ambulation Distance (Feet)  230 Feet    Assistive device  None    Gait Pattern  Decreased hip/knee flexion - right;Decreased dorsiflexion - right;Decreased weight shift to right;Right genu recurvatum;Lateral trunk lean to right;Decreased dorsiflexion - left   cues to slow cadence for safety    Ambulation Surface  Level;Indoor      High Level Balance   High Level Balance Activities  Side stepping;Other (comment)   squats added to sidestepping inside // bars     Knee/Hip Exercises: Standing   Heel Raises  Both;1 set;10 reps    Hip Flexion  Right;1 set;10 reps;Stengthening;Knee bent   3#  weight 10 reps   Hip Abduction  Stengthening;Right;1 set;10 reps;Knee straight   3# weight    Hip Extension  Stengthening;Right;1 set;10 reps;Knee bent   3# weight used on RLE   Forward Step Up  Right;1 set;10 reps;Hand Hold: 1;Step Height: 6"    Step Down  Right;1 set;10 reps;Hand Hold: 2;Step Height: 4"      Neck Exercises: Stretches   Upper Trapezius Stretch  Left;1 rep;30 seconds    Other Neck Stretches  Mulligan's stretch with use of towel - for Lt cervical rotators           Balance Exercises - 06/08/18 1723      Balance Exercises: Standing   Other Standing Exercises  Pt performed tap ups to 6" step with LLE for improved Rt knee control      Pt performed amb. On tiptoes inside // bars - 10' x 2 reps without UE support with SBA  PT Education - 06/08/18 1744    Education Details  added cervical stretch for rotators to HEP (Mulligan's stretch with use of towel)    Person(s) Educated  Patient;Spouse    Methods  Explanation;Demonstration     Comprehension  Verbalized understanding;Returned demonstration       PT Short Term Goals - 04/24/18 0935      PT SHORT TERM GOAL #1   Title  Improve Berg score from 26/56 to >/= 31/56 to reduce fall risk.    Baseline  9/20: 43/56     Time  4    Period  Weeks    Status  Achieved      PT SHORT TERM GOAL #2   Title  Improve TUG score from 18.09 secs without device to </= 15 secs without device for improved functional mobility and reduced fall risk.    Baseline  9/17: 16.72 secs without AD/AFO donned. Improved.     Time  4    Period  Weeks    Status  Not Met      PT SHORT TERM GOAL #3   Title  Pt will amb. 250' with RW with CGA on flat, even surface for incr. community accessibility.      Baseline  9/17: Pt amb. 250' with RW, Supervision on even surfaces.     Time  4    Period  Weeks    Status  Achieved      PT SHORT TERM GOAL #4   Title  Increase gait velocity from 1.53 ft/sec to >/= 1.9 ft/sec without device for incr. gait efficiency.    Baseline  9/17: 17.44secs = 1.104f./sec with no AD/ AFO donned; Improved.    Time  4    Period  Weeks    Status  Not Met      PT SHORT TERM GOAL #5   Title  Independent in HEP for RLE strengthening and balance exercises.    Baseline  9/20: Pt states she is independent with HEP.    Time  4    Period  Weeks    Status  Achieved        PT Long Term Goals - 06/01/18 1008      PT LONG TERM GOAL #1   Title  Pt will increase Berg score from 26/56 to >/= 36/56 to reduce fall risk.    Baseline  05/17/18: 47/56    Status  Achieved      PT LONG TERM GOAL #2   Title  AFO consult for RLE will be obtained -  and AFO for RLE obtained if determined to be needed.     Baseline  05/17/18: consult is set for 05/24/18    Status  On-going      PT LONG TERM GOAL #3   Title  Pt will amb. 500' with RW with supervision on flat, even surface for incr. community accessibility.    Status  Achieved      PT LONG TERM GOAL #4   Title  Improve TUG score from  18.09 secs to </= 13.5 secs without device to reduce fall risk.    Baseline  05/17/18: 13.35 sec's with no AD/brace used    Status  Achieved      PT LONG TERM GOAL #5   Title  Increase gait velocity from 1.53 ft/sec to >/= 2.2 ft/sec without device for incr. gait efficiency.     Baseline  05/17/18: 2.37 ft/sec no AD/brace used    Status  Achieved      Additional Long Term Goals   Additional Long Term Goals  Yes      PT LONG TERM GOAL #6   Title  Pt will be modified independent with household ambulation with use of Rt AFO.    Time  4    Period  Weeks    Status  New      PT LONG TERM GOAL #7   Title  Pt will increase Berg balance test score from 47/56 to >/= 50/56 to reduce fall risk.    Baseline  47/56 on 05-17-18    Time  4    Period  Weeks    Status  New      PT LONG TERM GOAL #8   Title  Negotiate 12 steps with 1 hand rail using a step over step sequence with ascension and step by step sequence with descension.     Time  4    Period  Weeks    Status  New      PT LONG TERM GOAL  #9   TITLE  Patient to demonstate appropraite posturing with ability to self-correcct 50% of the time for reduced stress/strain on neck and upper back musculature    Time  4    Period  Weeks    Status  New    Target Date  07/20/18      PT LONG TERM GOAL  #10   TITLE  Patient to improve cervical AROM to [facility] without pain provocation    Time  4    Period  Weeks    Status  New    Target Date  07/20/18      PT LONG TERM GOAL  #11   TITLE  Patient to report reduction in headache frequency and general neck pain by >/= 50%    Time  4    Period  Weeks    Status  New    Target Date  07/20/18            Plan - 06/08/18 1755    Clinical Impression Statement  Pt presents with decreased activity tolerance today due to having been sick due to having received flu shot per pt's report.  Pt reports no neck pain but rather reports headaches at times at base of skull.  Pt continues to exhibit Rt genu  recurvatum which increases with fatigue.  Pt states she will be receiving AFO from [facility] in next couple of weeks.  Rehab Potential  Good    Clinical Impairments Affecting Rehab Potential  chronicity of deficits - approx. 3 yrs since onset (due to transverse myelitis?)    PT Frequency  2x / week    PT Duration  8 weeks    PT Treatment/Interventions  ADLs/Self Care Home Management;DME Instruction;Gait training;Stair training;Orthotic Fit/Training;Patient/family education;Neuromuscular re-education;Balance training;Therapeutic exercise;Therapeutic activities;Passive range of motion;Moist Heat;Traction;Dry needling;Taping;Manual techniques;Spinal Manipulations;Joint Manipulations    PT Next Visit Plan  continue RLE strengthening and gait training; postural training, cervical manual prn, stretching    PT Home Exercise Plan  [id number] - cervical    Consulted and Agree with Plan of Care  Patient;Family member/caregiver    Family Member Consulted  spouse - [name]       Patient will benefit from skilled therapeutic intervention in order to improve the following deficits and impairments:  Abnormal gait, Decreased activity tolerance, Decreased balance, Decreased coordination, Decreased strength, Impaired flexibility, Impaired tone, Impaired UE functional use, Pain, Postural dysfunction, Improper body mechanics, Decreased range of motion, Increased fascial restricitons  Visit Diagnosis: Other abnormalities of gait and mobility  Unsteadiness on feet  Cervicalgia  Muscle weakness (generalized)     Problem List Patient Active Problem List   Diagnosis Date Noted  . CAD (coronary artery disease) 09/04/2013  . HTN (hypertension) 09/04/2013  . HLD (hyperlipidemia) 09/04/2013  . Tobacco abuse 09/04/2013    Alda Lea, PT  06/08/2018, 6:08 PM  [facility] [address] [place], [place], [address] Phone: [phone]   Fax:  [phone]  Name: Kim Sutton MRN: [id number] Date of Birth: Apr 01, 1934

## 2018-06-12 ENCOUNTER — Ambulatory Visit: Payer: Medicare Other | Admitting: Physical Therapy

## 2018-06-12 ENCOUNTER — Ambulatory Visit: Payer: Medicare Other | Admitting: Occupational Therapy

## 2018-06-12 DIAGNOSIS — R2689 Other abnormalities of gait and mobility: Secondary | ICD-10-CM

## 2018-06-12 DIAGNOSIS — R278 Other lack of coordination: Secondary | ICD-10-CM | POA: Diagnosis not present

## 2018-06-12 DIAGNOSIS — R293 Abnormal posture: Secondary | ICD-10-CM | POA: Diagnosis not present

## 2018-06-12 DIAGNOSIS — R2681 Unsteadiness on feet: Secondary | ICD-10-CM

## 2018-06-12 DIAGNOSIS — M6281 Muscle weakness (generalized): Secondary | ICD-10-CM

## 2018-06-12 DIAGNOSIS — M542 Cervicalgia: Secondary | ICD-10-CM | POA: Diagnosis not present

## 2018-06-12 NOTE — Therapy (Signed)
[facility] [address] [place], [place], [address] Phone: [phone]   Fax:  [phone]  Occupational Therapy Treatment  Patient Details  Name: Kim Sutton MRN: [id number] Date of Birth: 1934-05-09 No data recorded  Encounter Date: 06/12/2018  OT End of Session - 06/12/18 0920    Visit Number  11    Number of Visits  17    Date for OT Re-Evaluation  06/25/18    Authorization Type  cert. period  04/26/18-07/24/18    Authorization Time Period  Medicare / [place] - Visit Number  11    Authorization - Number of Visits  20    OT Start Time  0848    OT Stop Time  0930    OT Time Calculation (min)  42 min    Activity Tolerance  Patient tolerated treatment well    Behavior During Therapy  WFL for tasks assessed/performed       Past Medical History:  Diagnosis Date  . Adrenal cortical adenoma of left adrenal gland    stable since 2009;  MRI in 04/2010 - no further scans  . CAD (coronary artery disease)    a. s/p NSTEMI in 2001 tx with BMS to RCA  . Colon polyps   . HLD (hyperlipidemia)   . Hx of cardiovascular stress test    Lexiscan Myoview (09/2013):  No ischemia, EF 77%, Low Risk.  Marland Kitchen Hyperglycemia   . Hypertension   . Myoclonus    on neurontin per neuro at [facility]  . Nephrolithiasis   . Nodule of right lung    a. 22mm on CXR 10/2009;  CT 05/2010 ok - no further scans  . Tobacco abuse     Past Surgical History:  Procedure Laterality Date  . CHOLECYSTECTOMY    . CORONARY ANGIOPLASTY WITH STENT PLACEMENT    . TOTAL ABDOMINAL HYSTERECTOMY      There were no vitals filed for this visit.  Subjective Assessment - 06/12/18 0854    Subjective   Pt reports not doing much at home due to being sick    Pertinent History  PMH:  transverse myelitis, hx of shingles per husband; CAD; HTN, HLD, hard of hearing    Patient Stated Goals  improve ability to pick up things, use RUE more for cleaning,  strengthening    Currently in Pain?  No/denies              Treatment: In supine, closed chain shoulder flex and chest press x10 each with ball, min v.c followed by low range shoulder flexion in seated, mod facilitation.  light wt. Bearing for wall push ups, attempted wall slides however pt demonstrates significant compensation so task was discontinued   In standing, functional reaching to place/ remove large-small pegs from pegboard with RUE, min difficulty/v.c to avoid shoulder hike.   Removing with in-hand manipulation with min-mod difficulty.   Gripper level 1 for sustained grip to pick up 1 inch blocks, pt only completed approx 1/3 of blocks due to fatigue, several rest breaks required.  Arm bike x 6 min level 1 for reciprocal movement, min v.c. For compensation/head position               OT Short Term Goals - 05/29/18 0944      OT SHORT TERM GOAL #1   Title  Pt will be independent with coordination, RUE strength HEP.--check STGs 05/25/18    Time  4  Period  Weeks    Status  Achieved      OT SHORT TERM GOAL #2   Title  Pt will improve RUE functional use for eating as shown by improving PPT#2 by at least 5sec.    Baseline  23.34sec    Time  4    Period  Weeks    Status  Achieved   16.0 secs     OT SHORT TERM GOAL #3   Title  Pt will improve coordination for ADLs as shown by improving time on 9-hole peg test by at least 10sec.    Baseline  59.38sec    Time  4    Period  Weeks    Status  Achieved   05/22/2018  37.69     OT SHORT TERM GOAL #4   Title  Pt will improve R grip strength for opening containers/lifting objects by at least 5lbs.    Baseline  12lbs    Time  4    Period  Weeks    Status  Achieved   05/29/18:  22lbs     OT SHORT TERM GOAL #5   Title  Pt will be able to demo at least 85* R shoulder flex for functional reaching.    Baseline  75*    Time  8    Period  Weeks    Status  On-going   05/19/18 80*       OT Long Term  Goals - 06/07/18 0952      OT LONG TERM GOAL #1   Title  Pt will be able to cut food mod I using AE prn.    Time  8    Period  Weeks    Status  Achieved   06/07/18     OT LONG TERM GOAL #2   Title  Pt will be able to use RUE to eat with at least 50% of meal.    Time  8    Period  Weeks    Status  Achieved   06/07/18:  75% per pt/husband      OT LONG TERM GOAL #3   Title  Pt will improve RUE functional reaching/coordination for ADLs as shown by improving score on box and blocks test by at least 8.    Baseline  22    Time  8    Period  Weeks    Status  On-going      OT LONG TERM GOAL #4   Title  Pt will improve R grip strength for opening containers/lifting objects by at least 10lbs.    Baseline  12lbs    Period  Weeks    Status  On-going      OT LONG TERM GOAL #5   Title  Pt will be able to use RUE for cleaning/home maintenance tasks for at least 71min prior to rest break.    Time  8    Period  Weeks    Status  On-going      OT LONG TERM GOAL #6   Title  Pt will be able to demo at least 95* R shoulder flex for functional reaching.    Baseline  75*    Time  8    Period  Weeks    Status  On-going            Plan - 06/12/18 0925    Clinical Impression Statement  Pt is progressing towards goals for RUE coordination and functional use. Pt continues  to be limited by compensatory shoulder hike.    Occupational Profile and client history currently impacting functional performance  Pt is now limited in IADL performance, particularly when using dominant RUE.  Pt has to switch to nondominant LUE during ADLs/IADLs.      Occupational performance deficits (Please refer to evaluation for details):  ADL's;IADL's;Leisure;Social Participation    Rehab Potential  Good    OT Frequency  2x / week    OT Duration  8 weeks    OT Treatment/Interventions  Self-care/ADL training;Therapeutic exercise;Patient/family education;Neuromuscular education;Moist Heat;Fluidtherapy;Energy  conservation;Therapist, nutritional;Therapeutic activities;Balance training;Passive range of motion;Manual Therapy;DME and/or AE instruction;Cryotherapy    Plan  continue with neuro re-ed, RUE functional use, coordination, strength    OT Home Exercise Plan  Education provided:  coordination HEP, putty HEP, ball HEP    Consulted and Agree with Plan of Care  Patient;Family member/caregiver    Family Member Consulted  husband       Patient will benefit from skilled therapeutic intervention in order to improve the following deficits and impairments:  Decreased knowledge of use of DME, Decreased coordination, Decreased mobility, Decreased strength, Decreased range of motion, Decreased endurance, Decreased activity tolerance, Decreased balance, Impaired UE functional use  Visit Diagnosis: Other lack of coordination  Muscle weakness (generalized)  Abnormal posture  Other abnormalities of gait and mobility    Problem List Patient Active Problem List   Diagnosis Date Noted  . CAD (coronary artery disease) 09/04/2013  . HTN (hypertension) 09/04/2013  . HLD (hyperlipidemia) 09/04/2013  . Tobacco abuse 09/04/2013    Kam Kushnir 06/12/2018, 9:28 AM  [facility] [address] [place], [place], [address] Phone: [phone]   Fax:  [phone]  Name: Kim Sutton MRN: [id number] Date of Birth: 1934-01-18

## 2018-06-13 NOTE — Therapy (Signed)
[facility] [address] [place], [place], [address] Phone: [phone]   Fax:  [phone]  Physical Therapy Treatment  Patient Details  Name: Kim Sutton MRN: [id number] Date of Birth: [birth year] Referring Provider (PT): Dr. Lajean Manes   Encounter Date: 06/12/2018  PT End of Session - 06/13/18 2109    Visit Number  22    Number of Visits  32    Date for PT Re-Evaluation  07/20/18    Authorization Type  Medicare and [employer]    Authorization Time Period  03-22-18 - 06-20-18; 05-22-18 - 07-31-18    PT Start Time  0933    PT Stop Time  1016    PT Time Calculation (min)  43 min       Past Medical History:  Diagnosis Date  . Adrenal cortical adenoma of left adrenal gland    stable since 2009;  MRI in 04/2010 - no further scans  . CAD (coronary artery disease)    a. s/p NSTEMI in 2001 tx with BMS to RCA  . Colon polyps   . HLD (hyperlipidemia)   . Hx of cardiovascular stress test    Lexiscan Myoview (09/2013):  No ischemia, EF 77%, Low Risk.  Marland Kitchen Hyperglycemia   . Hypertension   . Myoclonus    on neurontin per neuro at [facility]  . Nephrolithiasis   . Nodule of right lung    a. 27m on CXR 10/2009;  CT 05/2010 ok - no further scans  . Tobacco abuse     Past Surgical History:  Procedure Laterality Date  . CHOLECYSTECTOMY    . CORONARY ANGIOPLASTY WITH STENT PLACEMENT    . TOTAL ABDOMINAL HYSTERECTOMY      There were no vitals filed for this visit.  Subjective Assessment - 06/13/18 2104    Subjective  Pt states [place] states she is able to amb. in home now without walker or cane    Patient is accompained by:  Family member    Pertinent History  h/o transverse myelitis;  (husband reports Rt sided weakness is due to pt having had lesion on spine due to shingles approx. 3 yrs ago):  CAD; HTN    Patient Stated Goals  "get up and walk some place" "reduce neck pain"    Currently in Pain?  No/denies                       [facility] Adult PT Treatment/Exercise - 06/13/18 0001      Transfers   Transfers  Sit to Stand    Sit to Stand  4: Min guard    Number of Reps  Other reps (comment)   5   Comments  Lt foot on balance bubble      Ambulation/Gait   Ambulation/Gait  Yes    Ambulation/Gait Assistance  4: Min guard    Ambulation/Gait Assistance Details  cues for step length and to control Rt genu recurvatum     Ambulation Distance (Feet)  200 Feet    Assistive device  None    Gait Pattern  Decreased hip/knee flexion - right;Decreased dorsiflexion - right;Decreased weight shift to right;Right genu recurvatum;Lateral trunk lean to right;Decreased dorsiflexion - left   cues to slow cadence for safety    Ambulation Surface  Level;Indoor      Knee/Hip Exercises: Standing   Heel Raises  Both;1 set;10 reps    Hip  Flexion  Right;1 set;10 reps;Stengthening;Knee bent   3# weight 10 reps   Hip Abduction  Stengthening;Right;1 set;10 reps;Knee straight   3# weight    Hip Extension  Stengthening;Right;1 set;10 reps;Knee bent   3# weight used on RLE   Forward Step Up  Right;1 set;10 reps;Hand Hold: 1;Step Height: 6"    Other Standing Knee Exercises  Rt hip and knee flexion with 3# weight in standing          Balance Exercises - 06/13/18 2108      Balance Exercises: Standing   Rockerboard  Anterior/posterior;EO;EC;10 reps   with UE support with EC inside // bars   Step Over Hurdles / Cones  Pt performed stepping over ornage hurdle iwth each foot 10 reps each with UE support prn    Other Standing Exercises  Pt performed tap ups to 6" step with LLE for improved Rt knee control          PT Short Term Goals - 04/24/18 0935      PT SHORT TERM GOAL #1   Title  Improve Berg score from 26/56 to >/= 31/56 to reduce fall risk.    Baseline  9/20: 43/56     Time  4    Period  Weeks    Status  Achieved      PT SHORT TERM GOAL #2   Title   Improve TUG score from 18.09 secs without device to </= 15 secs without device for improved functional mobility and reduced fall risk.    Baseline  9/17: 16.72 secs without AD/AFO donned. Improved.     Time  4    Period  Weeks    Status  Not Met      PT SHORT TERM GOAL #3   Title  Pt will amb. 250' with RW with CGA on flat, even surface for incr. community accessibility.      Baseline  9/17: Pt amb. 250' with RW, Supervision on even surfaces.     Time  4    Period  Weeks    Status  Achieved      PT SHORT TERM GOAL #4   Title  Increase gait velocity from 1.53 ft/sec to >/= 1.9 ft/sec without device for incr. gait efficiency.    Baseline  9/17: 17.44secs = 1.30f./sec with no AD/ AFO donned; Improved.    Time  4    Period  Weeks    Status  Not Met      PT SHORT TERM GOAL #5   Title  Independent in HEP for RLE strengthening and balance exercises.    Baseline  9/20: Pt states she is independent with HEP.    Time  4    Period  Weeks    Status  Achieved        PT Long Term Goals - 06/01/18 1008      PT LONG TERM GOAL #1   Title  Pt will increase Berg score from 26/56 to >/= 36/56 to reduce fall risk.    Baseline  05/17/18: 47/56    Status  Achieved      PT LONG TERM GOAL #2   Title  AFO consult for RLE will be obtained - and AFO for RLE obtained if determined to be needed.     Baseline  05/17/18: consult is set for 05/24/18    Status  On-going      PT LONG TERM GOAL #3   Title  Pt will amb. 500'  with RW with supervision on flat, even surface for incr. community accessibility.    Status  Achieved      PT LONG TERM GOAL #4   Title  Improve TUG score from 18.09 secs to </= 13.5 secs without device to reduce fall risk.    Baseline  05/17/18: 13.35 sec's with no AD/brace used    Status  Achieved      PT LONG TERM GOAL #5   Title  Increase gait velocity from 1.53 ft/sec to >/= 2.2 ft/sec without device for incr. gait efficiency.     Baseline  05/17/18: 2.37 ft/sec no AD/brace  used    Status  Achieved      Additional Long Term Goals   Additional Long Term Goals  Yes      PT LONG TERM GOAL #6   Title  Pt will be modified independent with household ambulation with use of Rt AFO.    Time  4    Period  Weeks    Status  New      PT LONG TERM GOAL #7   Title  Pt will increase Berg balance test score from 47/56 to >/= 50/56 to reduce fall risk.    Baseline  47/56 on 05-17-18    Time  4    Period  Weeks    Status  New      PT LONG TERM GOAL #8   Title  Negotiate 12 steps with 1 hand rail using a step over step sequence with ascension and step by step sequence with descension.     Time  4    Period  Weeks    Status  New      PT LONG TERM GOAL  #9   TITLE  Patient to demonstate appropraite posturing with ability to self-correcct 50% of the time for reduced stress/strain on neck and upper back musculature    Time  4    Period  Weeks    Status  New    Target Date  07/20/18      PT LONG TERM GOAL  #10   TITLE  Patient to improve cervical AROM to [facility] without pain provocation    Time  4    Period  Weeks    Status  New    Target Date  07/20/18      PT LONG TERM GOAL  #11   TITLE  Patient to report reduction in headache frequency and general neck pain by >/= 50%    Time  4    Period  Weeks    Status  New    Target Date  07/20/18            Plan - 06/13/18 2110    Clinical Impression Statement  Pt progressing well with improving RLE strength and balance; pt continues to have increased Rt knee hyperextension with fatigue and also decreased push off RLE due to weak plantarflexors     Rehab Potential  Good    Clinical Impairments Affecting Rehab Potential  chronicity of deficits - approx. 3 yrs since onset (due to transverse myelitis?)    PT Frequency  2x / week    PT Duration  8 weeks    PT Treatment/Interventions  ADLs/Self Care Home Management;DME Instruction;Gait training;Stair training;Orthotic Fit/Training;Patient/family education;Neuromuscular  re-education;Balance training;Therapeutic exercise;Therapeutic activities;Passive range of motion;Moist Heat;Traction;Dry needling;Taping;Manual techniques;Spinal Manipulations;Joint Manipulations    PT Next Visit Plan  continue RLE strengthening and gait training; postural training, cervical manual prn, stretching  PT Home Exercise Plan  [id number] - cervical    Consulted and Agree with Plan of Care  Patient;Family member/caregiver    Family Member Consulted  spouse - Kim Sutton       Patient will benefit from skilled therapeutic intervention in order to improve the following deficits and impairments:  Abnormal gait, Decreased activity tolerance, Decreased balance, Decreased coordination, Decreased strength, Impaired flexibility, Impaired tone, Impaired UE functional use, Pain, Postural dysfunction, Improper body mechanics, Decreased range of motion, Increased fascial restricitons  Visit Diagnosis: Muscle weakness (generalized)  Other abnormalities of gait and mobility  Unsteadiness on feet     Problem List Patient Active Problem List   Diagnosis Date Noted  . CAD (coronary artery disease) 09/04/2013  . HTN (hypertension) 09/04/2013  . HLD (hyperlipidemia) 09/04/2013  . Tobacco abuse 09/04/2013    Alda Lea, PT 06/13/2018, 9:19 PM  [facility] [address] [place], [place], [address] Phone: [phone]   Fax:  [phone]  Name: Kim Sutton MRN: [id number] Date of Birth: October 04, 1933

## 2018-06-14 ENCOUNTER — Ambulatory Visit: Payer: Medicare Other | Admitting: Occupational Therapy

## 2018-06-14 ENCOUNTER — Encounter: Payer: Self-pay | Admitting: Occupational Therapy

## 2018-06-14 ENCOUNTER — Ambulatory Visit: Payer: Medicare Other | Admitting: Physical Therapy

## 2018-06-14 DIAGNOSIS — R2689 Other abnormalities of gait and mobility: Secondary | ICD-10-CM | POA: Diagnosis not present

## 2018-06-14 DIAGNOSIS — R2681 Unsteadiness on feet: Secondary | ICD-10-CM | POA: Diagnosis not present

## 2018-06-14 DIAGNOSIS — M6281 Muscle weakness (generalized): Secondary | ICD-10-CM

## 2018-06-14 DIAGNOSIS — R278 Other lack of coordination: Secondary | ICD-10-CM

## 2018-06-14 DIAGNOSIS — R293 Abnormal posture: Secondary | ICD-10-CM

## 2018-06-14 DIAGNOSIS — M542 Cervicalgia: Secondary | ICD-10-CM | POA: Diagnosis not present

## 2018-06-14 NOTE — Therapy (Signed)
[facility] [address] [place], [place], [address] Phone: [phone]   Fax:  [phone]  Occupational Therapy Treatment  Patient Details  Name: Kim Sutton MRN: [id number] Date of Birth: 1934-02-07 No data recorded  Encounter Date: 06/14/2018  OT End of Session - 06/14/18 0931    Visit Number  12    Number of Visits  17    Date for OT Re-Evaluation  06/25/18    Authorization Type  cert. period  04/26/18-07/24/18    Authorization Time Period  Medicare / [place] - Visit Number  12    Authorization - Number of Visits  20    OT Start Time  [phone]    OT Stop Time  1015    OT Time Calculation (min)  42 min    Activity Tolerance  Patient tolerated treatment well    Behavior During Therapy  WFL for tasks assessed/performed       Past Medical History:  Diagnosis Date  . Adrenal cortical adenoma of left adrenal gland    stable since 2009;  MRI in 04/2010 - no further scans  . CAD (coronary artery disease)    a. s/p NSTEMI in 2001 tx with BMS to RCA  . Colon polyps   . HLD (hyperlipidemia)   . Hx of cardiovascular stress test    Lexiscan Myoview (09/2013):  No ischemia, EF 77%, Low Risk.  Marland Kitchen Hyperglycemia   . Hypertension   . Myoclonus    on neurontin per neuro at [facility]  . Nephrolithiasis   . Nodule of right lung    a. 96mm on CXR 10/2009;  CT 05/2010 ok - no further scans  . Tobacco abuse     Past Surgical History:  Procedure Laterality Date  . CHOLECYSTECTOMY    . CORONARY ANGIOPLASTY WITH STENT PLACEMENT    . TOTAL ABDOMINAL HYSTERECTOMY      There were no vitals filed for this visit.  Subjective Assessment - 06/14/18 0930    Subjective   Got my new leg brace yesterday    Pertinent History  PMH:  transverse myelitis, hx of shingles per husband; CAD; HTN, HLD, hard of hearing    Patient Stated Goals  improve ability to pick up things, use RUE more for cleaning, strengthening    Currently in  Pain?  No/denies        Placing grooved pegs in pegboard with mod difficulty with in-hand manipulation with RUE, min cues for shoulder hike.  In standing, low-mid range functional reaching with RUE to place/remove clothespins with 1-8lb resistance on vertical and horizontal poles with min difficulty/cueing for shoulder compensation.  Placing small pegs in pegboard to copy design with R hand with min difficulty/min cueing for posture.  Removing using in hand manipulation with min difficulty.   In standing with hands on table in modified quadruped, forward/backward wt. Shifts with min cueing for incr scapular depression.  AAROM shoulder movement/light wt. Bearing to wipe table in circles with RUE in standing.    Arm bike x 60min level 1 for reciprocal movement and conditioning without rest.     OT Education - 06/14/18 1245    Education Details  Yellow theraband HEP (low level horizontal abduction, shoulder ext, rows/scapular retraction)    Person(s) Educated  Patient;Spouse    Methods  Explanation;Demonstration;Verbal cues;Handout    Comprehension  Verbalized understanding;Returned demonstration;Verbal cues required       OT Short Term Goals - 05/29/18  [place] #1   Title  Pt will be independent with coordination, RUE strength HEP.--check STGs 05/25/18    Time  4    Period  Weeks    Status  Achieved      OT SHORT TERM GOAL #2   Title  Pt will improve RUE functional use for eating as shown by improving PPT#2 by at least 5sec.    Baseline  23.34sec    Time  4    Period  Weeks    Status  Achieved   16.0 secs     OT SHORT TERM GOAL #3   Title  Pt will improve coordination for ADLs as shown by improving time on 9-hole peg test by at least 10sec.    Baseline  59.38sec    Time  4    Period  Weeks    Status  Achieved   05/22/2018  37.69     OT SHORT TERM GOAL #4   Title  Pt will improve R grip strength for opening containers/lifting objects by at least  5lbs.    Baseline  12lbs    Time  4    Period  Weeks    Status  Achieved   05/29/18:  22lbs     OT SHORT TERM GOAL #5   Title  Pt will be able to demo at least 85* R shoulder flex for functional reaching.    Baseline  75*    Time  8    Period  Weeks    Status  On-going   05/19/18 80*       OT Long Term Goals - 06/07/18 0952      OT LONG TERM GOAL #1   Title  Pt will be able to cut food mod I using AE prn.    Time  8    Period  Weeks    Status  Achieved   06/07/18     OT LONG TERM GOAL #2   Title  Pt will be able to use RUE to eat with at least 50% of meal.    Time  8    Period  Weeks    Status  Achieved   06/07/18:  75% per pt/husband      OT LONG TERM GOAL #3   Title  Pt will improve RUE functional reaching/coordination for ADLs as shown by improving score on box and blocks test by at least 8.    Baseline  22    Time  8    Period  Weeks    Status  On-going      OT LONG TERM GOAL #4   Title  Pt will improve R grip strength for opening containers/lifting objects by at least 10lbs.    Baseline  12lbs    Period  Weeks    Status  On-going      OT LONG TERM GOAL #5   Title  Pt will be able to use RUE for cleaning/home maintenance tasks for at least 11min prior to rest break.    Time  8    Period  Weeks    Status  On-going      OT LONG TERM GOAL #6   Title  Pt will be able to demo at least 95* R shoulder flex for functional reaching.    Baseline  75*    Time  8    Period  Weeks    Status  On-going  Plan - 06/14/18 0931    Occupational Profile and client history currently impacting functional performance  Pt is now limited in IADL performance, particularly when using dominant RUE.  Pt has to switch to nondominant LUE during ADLs/IADLs.      Occupational performance deficits (Please refer to evaluation for details):  ADL's;IADL's;Leisure;Social Participation    Rehab Potential  Good    OT Frequency  2x / week    OT Duration  8 weeks    OT  Treatment/Interventions  Self-care/ADL training;Therapeutic exercise;Patient/family education;Neuromuscular education;Moist Heat;Fluidtherapy;Energy conservation;Therapist, nutritional;Therapeutic activities;Balance training;Passive range of motion;Manual Therapy;DME and/or AE instruction;Cryotherapy    Plan  continue with neuro re-ed, RUE functional use, coordination, strength    OT Home Exercise Plan  Education provided:  coordination HEP, putty HEP, ball HEP    Consulted and Agree with Plan of Care  Patient;Family member/caregiver    Family Member Consulted  husband       Patient will benefit from skilled therapeutic intervention in order to improve the following deficits and impairments:  Decreased knowledge of use of DME, Decreased coordination, Decreased mobility, Decreased strength, Decreased range of motion, Decreased endurance, Decreased activity tolerance, Decreased balance, Impaired UE functional use  Visit Diagnosis: Muscle weakness (generalized)  Other lack of coordination  Abnormal posture  Other abnormalities of gait and mobility  Unsteadiness on feet    Problem List Patient Active Problem List   Diagnosis Date Noted  . CAD (coronary artery disease) 09/04/2013  . HTN (hypertension) 09/04/2013  . HLD (hyperlipidemia) 09/04/2013  . Tobacco abuse 09/04/2013    [facility] 06/14/2018, 12:46 PM  [facility] [address] [place], [place], [address] Phone: [phone]   Fax:  [phone]  Name: Kim Sutton MRN: [id number] Date of Birth: Mar 16, 1934   Vianne Bulls, OTR/L [facility] [address] [place], [place]  [address] [phone] phone [phone] 06/14/18 12:46 PM

## 2018-06-14 NOTE — Patient Instructions (Signed)
    Strengthening: Resisted Extension   Attach one end to door.  Hold tubing in one hand, arm forward. Pull arm back, elbow straight. Repeat 10 times per set. Do 1 sessions per day.   Resisted Horizontal Abduction: Bilateral   Sit or stand, tubing in both hands, palms down and arms out in front. Keeping arms straight, pinch shoulder blades together and stretch arms out. HOLD LOW AT BELLY BUTTON LEVEL. Repeat 10 times per set.  Do 1 sessions per day.  Scapular Retraction: Rowing (Eccentric) - Arms - Side (Resistance Band)    Hold end of band in each hand. Pull back until elbows are even with trunk. Keep elbows by sides, thumbs up. Slowly release for 3-5 seconds. Use _yllow_ resistance band. 10reps per day.

## 2018-06-15 ENCOUNTER — Encounter: Payer: Self-pay | Admitting: Physical Therapy

## 2018-06-15 NOTE — Therapy (Signed)
[facility] [address] [place], [place], [address] Phone: [phone]   Fax:  [phone]  Physical Therapy Treatment  Patient Details  Name: Kim Sutton MRN: [id number] Date of Birth: 1934-05-26 Referring Provider (PT): Dr. Lajean Manes   Encounter Date: 06/14/2018  PT End of Session - 06/15/18 1733    Visit Number  23    Number of Visits  32    Date for PT Re-Evaluation  07/20/18    Authorization Type  Medicare and AARP    Authorization Time Period  03-22-18 - 06-20-18; 05-22-18 - 07-31-18    PT Start Time  0850    PT Stop Time  0930    PT Time Calculation (min)  40 min       Past Medical History:  Diagnosis Date  . Adrenal cortical adenoma of left adrenal gland    stable since 2009;  MRI in 04/2010 - no further scans  . CAD (coronary artery disease)    a. s/p NSTEMI in 2001 tx with BMS to RCA  . Colon polyps   . HLD (hyperlipidemia)   . Hx of cardiovascular stress test    Lexiscan Myoview (09/2013):  No ischemia, EF 77%, Low Risk.  Marland Kitchen Hyperglycemia   . Hypertension   . Myoclonus    on neurontin per neuro at [facility]  . Nephrolithiasis   . Nodule of right lung    a. 72m on CXR 10/2009;  CT 05/2010 ok - no further scans  . Tobacco abuse     Past Surgical History:  Procedure Laterality Date  . CHOLECYSTECTOMY    . CORONARY ANGIOPLASTY WITH STENT PLACEMENT    . TOTAL ABDOMINAL HYSTERECTOMY      There were no vitals filed for this visit.  Subjective Assessment - 06/15/18 1646    Subjective  Pt states she received the AFO from [employer] on Wed. - pt states "I walk better without it"    Patient is accompained by:  Family member    Pertinent History  h/o transverse myelitis;  (husband reports Rt sided weakness is due to pt having had lesion on spine due to shingles approx. 3 yrs ago):  CAD; HTN    Patient Stated Goals  "get up and walk some place" "reduce neck pain"    Currently in Pain?  No/denies                        [facility] Adult PT Treatment/Exercise - 06/15/18 0001      Transfers   Transfers  Sit to Stand    Sit to Stand  4: Min guard    Number of Reps  Other reps (comment)    Comments  Lt foot on balance bubble      Ambulation/Gait   Ambulation/Gait  Yes    Ambulation/Gait Assistance  4: Min guard    Ambulation/Gait Assistance Details  custom AFO on RLE: used heel wedge to reduce knee hyperextension as AFO alone did not control genu recurvatum     Ambulation Distance (Feet)  350 Feet   115' with AFO only - no heel wedge   Assistive device  None    Gait Pattern  Decreased hip/knee flexion - right;Decreased dorsiflexion - right;Decreased weight shift to right;Right genu recurvatum;Lateral trunk lean to right;Decreased dorsiflexion - left   cues to slow cadence for safety    Ambulation Surface  Level;Indoor    Gait Comments  Heel wedge in  Rt shoe helped to control Rt knee hyperextension       Knee/Hip Exercises: Standing   Heel Raises  Both;1 set;10 reps      gait training initially with AFO only - 115' ; then without AFO for gait pattern comparison with AFO; decreased eccentric control Rt foot noted without use of custom orthosis Heel wedge added with AFO - significant improvement in Rt genu recurvatum noted with use of heel wedge under AFO       PT Education - 06/15/18 1720    Education Details  instructed pt in wear schedule for AFO; use of heel wedge under AFO used to reduce knee hyperextension; instructed pt to try using wedge in other shoes if not wearing brace while walking in home    Person(s) Educated  Patient;Spouse    Methods  Explanation;Demonstration    Comprehension  Verbalized understanding;Returned demonstration       PT Short Term Goals - 04/24/18 0935      PT SHORT TERM GOAL #1   Title  Improve Berg score from 26/56 to >/= 31/56 to reduce fall risk.    Baseline  9/20: 43/56     Time  4    Period  Weeks    Status  Achieved       PT SHORT TERM GOAL #2   Title  Improve TUG score from 18.09 secs without device to </= 15 secs without device for improved functional mobility and reduced fall risk.    Baseline  9/17: 16.72 secs without AD/AFO donned. Improved.     Time  4    Period  Weeks    Status  Not Met      PT SHORT TERM GOAL #3   Title  Pt will amb. 250' with RW with CGA on flat, even surface for incr. community accessibility.      Baseline  9/17: Pt amb. 250' with RW, Supervision on even surfaces.     Time  4    Period  Weeks    Status  Achieved      PT SHORT TERM GOAL #4   Title  Increase gait velocity from 1.53 ft/sec to >/= 1.9 ft/sec without device for incr. gait efficiency.    Baseline  9/17: 17.44secs = 1.38f./sec with no AD/ AFO donned; Improved.    Time  4    Period  Weeks    Status  Not Met      PT SHORT TERM GOAL #5   Title  Independent in HEP for RLE strengthening and balance exercises.    Baseline  9/20: Pt states she is independent with HEP.    Time  4    Period  Weeks    Status  Achieved        PT Long Term Goals - 06/01/18 1008      PT LONG TERM GOAL #1   Title  Pt will increase Berg score from 26/56 to >/= 36/56 to reduce fall risk.    Baseline  05/17/18: 47/56    Status  Achieved      PT LONG TERM GOAL #2   Title  AFO consult for RLE will be obtained - and AFO for RLE obtained if determined to be needed.     Baseline  05/17/18: consult is set for 05/24/18    Status  On-going      PT LONG TERM GOAL #3   Title  Pt will amb. 500' with RW with supervision on flat, even  surface for incr. community accessibility.    Status  Achieved      PT LONG TERM GOAL #4   Title  Improve TUG score from 18.09 secs to </= 13.5 secs without device to reduce fall risk.    Baseline  05/17/18: 13.35 sec's with no AD/brace used    Status  Achieved      PT LONG TERM GOAL #5   Title  Increase gait velocity from 1.53 ft/sec to >/= 2.2 ft/sec without device for incr. gait efficiency.     Baseline   05/17/18: 2.37 ft/sec no AD/brace used    Status  Achieved      Additional Long Term Goals   Additional Long Term Goals  Yes      PT LONG TERM GOAL #6   Title  Pt will be modified independent with household ambulation with use of Rt AFO.    Time  4    Period  Weeks    Status  New      PT LONG TERM GOAL #7   Title  Pt will increase Berg balance test score from 47/56 to >/= 50/56 to reduce fall risk.    Baseline  47/56 on 05-17-18    Time  4    Period  Weeks    Status  New      PT LONG TERM GOAL #8   Title  Negotiate 12 steps with 1 hand rail using a step over step sequence with ascension and step by step sequence with descension.     Time  4    Period  Weeks    Status  New      PT LONG TERM GOAL  #9   TITLE  Patient to demonstate appropraite posturing with ability to self-correcct 50% of the time for reduced stress/strain on neck and upper back musculature    Time  4    Period  Weeks    Status  New    Target Date  07/20/18      PT LONG TERM GOAL  #10   TITLE  Patient to improve cervical AROM to [facility] without pain provocation    Time  4    Period  Weeks    Status  New    Target Date  07/20/18      PT LONG TERM GOAL  #11   TITLE  Patient to report reduction in headache frequency and general neck pain by >/= 50%    Time  4    Period  Weeks    Status  New    Target Date  07/20/18            Plan - 06/15/18 1738    Clinical Impression Statement  Pt needs use of heel wedge in combination with Rt custom AFO for increased foot clearance and for control of Rt knee hyperextension in stance; AFO was used alone initially and Rt knee continued to hyperextend, with increased hyperextension noted with fatigue;  heel wedge used and decreased genu recurvatum noted     Rehab Potential  Good    Clinical Impairments Affecting Rehab Potential  chronicity of deficits - approx. 3 yrs since onset (due to transverse myelitis?)    PT Frequency  2x / week    PT Duration  8 weeks    PT  Treatment/Interventions  ADLs/Self Care Home Management;DME Instruction;Gait training;Stair training;Orthotic Fit/Training;Patient/family education;Neuromuscular re-education;Balance training;Therapeutic exercise;Therapeutic activities;Passive range of motion;Moist Heat;Traction;Dry needling;Taping;Manual techniques;Spinal Manipulations;Joint Manipulations    PT Next  Visit Plan  continue RLE strengthening and gait training with AFO on RLE:  postural training, cervical manual prn, stretching    Consulted and Agree with Plan of Care  Patient;Family member/caregiver    Family Member Consulted  spouse - [name]       Patient will benefit from skilled therapeutic intervention in order to improve the following deficits and impairments:  Abnormal gait, Decreased activity tolerance, Decreased balance, Decreased coordination, Decreased strength, Impaired flexibility, Impaired tone, Impaired UE functional use, Pain, Postural dysfunction, Improper body mechanics, Decreased range of motion, Increased fascial restricitons  Visit Diagnosis: Other abnormalities of gait and mobility  Muscle weakness (generalized)     Problem List Patient Active Problem List   Diagnosis Date Noted  . CAD (coronary artery disease) 09/04/2013  . HTN (hypertension) 09/04/2013  . HLD (hyperlipidemia) 09/04/2013  . Tobacco abuse 09/04/2013    Alda Lea, PT 06/15/2018, 5:48 PM  [facility] [address] [place], [place], [address] Phone: [phone]   Fax:  [phone]  Name: Kim Sutton MRN: [id number] Date of Birth: [birth year]

## 2018-06-18 NOTE — Therapy (Signed)
[facility] [address] [place], [place], [address] Phone: [phone]   Fax:  [phone]  Physical Therapy Treatment  Patient Details  Name: Kim Sutton MRN: [id number] Date of Birth: 1934/07/22 Referring Provider (PT): Dr. Lajean Manes   Encounter Date: 05/22/2018   05/23/18 1233  PT Visits / Re-Eval  Visit Number 16  Number of Visits 23  Date for PT Re-Evaluation 06/22/18  Authorization  Authorization Type Medicare and AARP  Authorization Time Period 03-22-18 - 06-20-18; 05-22-18 - 07-31-18  PT Time Calculation  PT Start Time 0850  PT Stop Time 0930  PT Time Calculation (min) 40 min     Past Medical History:  Diagnosis Date  . Adrenal cortical adenoma of left adrenal gland    stable since 2009;  MRI in 04/2010 - no further scans  . CAD (coronary artery disease)    a. s/p NSTEMI in 2001 tx with BMS to RCA  . Colon polyps   . HLD (hyperlipidemia)   . Hx of cardiovascular stress test    Lexiscan Myoview (09/2013):  No ischemia, EF 77%, Low Risk.  Marland Kitchen Hyperglycemia   . Hypertension   . Myoclonus    on neurontin per neuro at [facility]  . Nephrolithiasis   . Nodule of right lung    a. 60m on CXR 10/2009;  CT 05/2010 ok - no further scans  . Tobacco abuse     Past Surgical History:  Procedure Laterality Date  . CHOLECYSTECTOMY    . CORONARY ANGIOPLASTY WITH STENT PLACEMENT    . TOTAL ABDOMINAL HYSTERECTOMY      There were no vitals filed for this visit.     05/23/18 1223  Symptoms/Limitations  Subjective Pt reports no changes since PT session last week; husband states pt is improving  Patient is accompained by: Family member  Pertinent History h/o transverse myelitis;  (husband reports Rt sided weakness is due to pt having had lesion on spine due to shingles approx. 3 yrs ago):  CAD; HTN  Patient Stated Goals "get up and walk some place"  Pain Assessment  Currently in Pain? No/denies      05/23/18 0001  Transfers  Transfers Sit to Stand;Stand to Sit  Sit to Stand 5: Supervision;With upper extremity assist;From bed;From chair/3-in-1  Stand to Sit 5: Supervision;With upper extremity assist;To bed;To chair/3-in-1  Number of Reps 10 reps (5 reps feet on floor:  5 reps Lt foot on balance bubble )  Ambulation/Gait  Ambulation/Gait Yes  Ambulation/Gait Assistance 4: Min guard  Ambulation/Gait Assistance Details increased Rt knee hyperextension noted with fatigue   Ambulation Distance (Feet) 350 Feet  Assistive device None  Gait Pattern Decreased hip/knee flexion - right;Decreased dorsiflexion - right;Decreased weight shift to right;Right genu recurvatum;Lateral trunk lean to right;Decreased dorsiflexion - left  Ambulation Surface Level;Indoor  Exercises  Exercises Knee/Hip  Knee/Hip Exercises: Standing  Heel Raises Both;1 set;10 reps  Hip Flexion Right;1 set;10 reps;Stengthening;Knee bent (3# weight 10 reps)  Hip Abduction Stengthening;Right;1 set;10 reps;Knee straight (3# weight )  Hip Extension Stengthening;Right;1 set;10 reps;Knee bent (3# weight used on RLE)  Forward Step Up Right;1 set;10 reps;Hand Hold: 1;Step Height: 6"     05/23/18 1227  Balance Exercises: Standing  Step Over Hurdles / Cones Pt performed stepping over orange hurdle 10 reps each leg with min to CGA with min. UE support   Other Standing Exercises Pt performed tap ups to 6" step with LLE for Rt knee control with cues  to prevent hyperextension as able;  pt used 1 UE support on rail with CGA         PT Short Term Goals - 04/24/18 0935      PT SHORT TERM GOAL #1   Title  Improve Berg score from 26/56 to >/= 31/56 to reduce fall risk.    Baseline  9/20: 43/56     Time  4    Period  Weeks    Status  Achieved      PT SHORT TERM GOAL #2   Title  Improve TUG score from 18.09 secs without device to </= 15 secs without device for improved functional mobility and reduced fall risk.    Baseline   9/17: 16.72 secs without AD/AFO donned. Improved.     Time  4    Period  Weeks    Status  Not Met      PT SHORT TERM GOAL #3   Title  Pt will amb. 250' with RW with CGA on flat, even surface for incr. community accessibility.      Baseline  9/17: Pt amb. 250' with RW, Supervision on even surfaces.     Time  4    Period  Weeks    Status  Achieved      PT SHORT TERM GOAL #4   Title  Increase gait velocity from 1.53 ft/sec to >/= 1.9 ft/sec without device for incr. gait efficiency.    Baseline  9/17: 17.44secs = 1.82f./sec with no AD/ AFO donned; Improved.    Time  4    Period  Weeks    Status  Not Met      PT SHORT TERM GOAL #5   Title  Independent in HEP for RLE strengthening and balance exercises.    Baseline  9/20: Pt states she is independent with HEP.    Time  4    Period  Weeks    Status  Achieved        PT Long Term Goals - 06/01/18 1008      PT LONG TERM GOAL #1   Title  Pt will increase Berg score from 26/56 to >/= 36/56 to reduce fall risk.    Baseline  05/17/18: 47/56    Status  Achieved      PT LONG TERM GOAL #2   Title  AFO consult for RLE will be obtained - and AFO for RLE obtained if determined to be needed.     Baseline  05/17/18: consult is set for 05/24/18    Status  On-going      PT LONG TERM GOAL #3   Title  Pt will amb. 500' with RW with supervision on flat, even surface for incr. community accessibility.    Status  Achieved      PT LONG TERM GOAL #4   Title  Improve TUG score from 18.09 secs to </= 13.5 secs without device to reduce fall risk.    Baseline  05/17/18: 13.35 sec's with no AD/brace used    Status  Achieved      PT LONG TERM GOAL #5   Title  Increase gait velocity from 1.53 ft/sec to >/= 2.2 ft/sec without device for incr. gait efficiency.     Baseline  05/17/18: 2.37 ft/sec no AD/brace used    Status  Achieved      Additional Long Term Goals   Additional Long Term Goals  Yes      PT LONG TERM GOAL #6  Title  Pt will be  modified independent with household ambulation with use of Rt AFO.    Time  4    Period  Weeks    Status  New      PT LONG TERM GOAL #7   Title  Pt will increase Berg balance test score from 47/56 to >/= 50/56 to reduce fall risk.    Baseline  47/56 on 05-17-18    Time  4    Period  Weeks    Status  New      PT LONG TERM GOAL #8   Title  Negotiate 12 steps with 1 hand rail using a step over step sequence with ascension and step by step sequence with descension.     Time  4    Period  Weeks    Status  New      PT LONG TERM GOAL  #9   TITLE  Patient to demonstate appropraite posturing with ability to self-correcct 50% of the time for reduced stress/strain on neck and upper back musculature    Time  4    Period  Weeks    Status  New    Target Date  07/20/18      PT LONG TERM GOAL  #10   TITLE  Patient to improve cervical AROM to [facility] without pain provocation    Time  4    Period  Weeks    Status  New    Target Date  07/20/18      PT LONG TERM GOAL  #11   TITLE  Patient to report reduction in headache frequency and general neck pain by >/= 50%    Time  4    Period  Weeks    Status  New    Target Date  07/20/18         05/23/18 1847  Plan  Clinical Impression Statement Pt has met all LTG's with exception of LTG #2 which is on-going as pt is scheduled for AFO consult on 05-24-18.  Pt has made good progress with increasing RLE strength and balance and gait; pt requests/agrees with recertification for 4 additional weeks.  Pt will benefit from skilled therapeutic intervention in order to improve on the following deficits Abnormal gait;Decreased activity tolerance;Decreased balance;Decreased coordination;Decreased strength;Impaired flexibility;Impaired tone;Impaired UE functional use;Pain  Rehab Potential Good  Clinical Impairments Affecting Rehab Potential chronicity of deficits - approx. 3 yrs since onset (due to transverse myelitis?)  PT Frequency 2x / week  PT Duration 8  weeks  PT Treatment/Interventions ADLs/Self Care Home Management;DME Instruction;Gait training;Stair training;Orthotic Fit/Training;Patient/family education;Neuromuscular re-education;Balance training;Therapeutic exercise;Therapeutic activities;Passive range of motion  PT Next Visit Plan AFO consult scheduled for 05-24-18 with Gerald Stabs from [facility] see above  Consulted and Agree with Plan of Care Patient;Family member/caregiver  Family Member Consulted spouse - [name]        Patient will benefit from skilled therapeutic intervention in order to improve the following deficits and impairments:  Abnormal gait, Decreased activity tolerance, Decreased balance, Decreased coordination, Decreased strength, Impaired flexibility, Impaired tone, Impaired UE functional use, Pain  Visit Diagnosis: Other abnormalities of gait and mobility - Plan: PT plan of care cert/re-cert  Unsteadiness on feet - Plan: PT plan of care cert/re-cert  Muscle weakness (generalized) - Plan: PT plan of care cert/re-cert     Problem List Patient Active Problem List   Diagnosis Date Noted  . CAD (coronary artery disease) 09/04/2013  . HTN (hypertension) 09/04/2013  .  HLD (hyperlipidemia) 09/04/2013  . Tobacco abuse 09/04/2013    Alda Lea, PT 06/18/2018, 3:51 PM  [facility] [address] [place], [place], [address] Phone: [phone]   Fax:  [phone]  Name: ZAYLA AGAR MRN: [id number] Date of Birth: 1934/01/30

## 2018-06-19 ENCOUNTER — Ambulatory Visit: Payer: Medicare Other | Admitting: Occupational Therapy

## 2018-06-19 ENCOUNTER — Ambulatory Visit: Payer: Medicare Other

## 2018-06-19 DIAGNOSIS — R293 Abnormal posture: Secondary | ICD-10-CM | POA: Diagnosis not present

## 2018-06-19 DIAGNOSIS — R2681 Unsteadiness on feet: Secondary | ICD-10-CM | POA: Diagnosis not present

## 2018-06-19 DIAGNOSIS — M6281 Muscle weakness (generalized): Secondary | ICD-10-CM

## 2018-06-19 DIAGNOSIS — R278 Other lack of coordination: Secondary | ICD-10-CM

## 2018-06-19 DIAGNOSIS — R2689 Other abnormalities of gait and mobility: Secondary | ICD-10-CM

## 2018-06-19 DIAGNOSIS — M542 Cervicalgia: Secondary | ICD-10-CM | POA: Diagnosis not present

## 2018-06-19 NOTE — Therapy (Signed)
[facility] [address] [place], [place], [address] Phone: [phone]   Fax:  [phone]  Physical Therapy Treatment  Patient Details  Name: Kim Sutton MRN: [id number] Date of Birth: 07/30/1934 Referring Provider (PT): Dr. Lajean Manes   Encounter Date: 06/19/2018  PT End of Session - 06/19/18 0850    Visit Number  24    Number of Visits  32    Date for PT Re-Evaluation  07/20/18    Authorization Type  Medicare and [employer]    Authorization Time Period  03-22-18 - 06-20-18; 05-22-18 - 07-31-18    PT Start Time  0848    PT Stop Time  0930    PT Time Calculation (min)  42 min    Equipment Utilized During Treatment  Gait belt    Activity Tolerance  Patient tolerated treatment well    Behavior During Therapy  [facility] for tasks assessed/performed       Past Medical History:  Diagnosis Date  . Adrenal cortical adenoma of left adrenal gland    stable since 2009;  MRI in 04/2010 - no further scans  . CAD (coronary artery disease)    a. s/p NSTEMI in 2001 tx with BMS to RCA  . Colon polyps   . HLD (hyperlipidemia)   . Hx of cardiovascular stress test    Lexiscan Myoview (09/2013):  No ischemia, EF 77%, Low Risk.  Marland Kitchen Hyperglycemia   . Hypertension   . Myoclonus    on neurontin per neuro at [facility]  . Nephrolithiasis   . Nodule of right lung    a. 46m on CXR 10/2009;  CT 05/2010 ok - no further scans  . Tobacco abuse     Past Surgical History:  Procedure Laterality Date  . CHOLECYSTECTOMY    . CORONARY ANGIOPLASTY WITH STENT PLACEMENT    . TOTAL ABDOMINAL HYSTERECTOMY      There were no vitals filed for this visit.  Subjective Assessment - 06/19/18 0849    Subjective  No falls, HEP is going well.     Patient is accompained by:  Family member    Pertinent History  h/o transverse myelitis;  (husband reports Rt sided weakness is due to pt having had lesion on spine due to shingles approx. 3 yrs ago):  CAD; HTN     Patient Stated Goals  "get up and walk some place" "reduce neck pain"    Currently in Pain?  No/denies         [facility] 06[facility]18 0850      Transfers   Transfers  Sit to Stand;Stand to Sit    Sit to Stand  4: Min guard    Sit to Stand Details (indicate cue type and reason)  From physioball, multiple advances prior to standing on first attempt.     Stand to Sit  4: Min guard    Stand to Sit Details  To physioball, Vc's for controlled descent    Number of Reps  10 reps;2 sets      Ambulation/Gait   Ambulation/Gait  Yes    Ambulation/Gait Assistance  4: Min guard;Other (comment)   HHA   Ambulation/Gait Assistance Details  RLE AFO, R heel wedge. Pt fatigued quickly after one lap around gym. HHA with initial lap, min guard with second lap with some instability and crossover gait. VC's for heel strike/toe off, pacing and safety awareness.     Ambulation Distance (Feet)  115 Feet  x2   Assistive device  None    Gait Pattern  Decreased hip/knee flexion - right;Decreased dorsiflexion - right;Decreased weight shift to right;Right genu recurvatum;Lateral trunk lean to right;Decreased dorsiflexion - left    Ambulation Surface  Level;Indoor      Exercises   Exercises  Knee/Hip      Knee/Hip Exercises: Aerobic   Nustep  L3, 10 mins, BLE use only.       Knee/Hip Exercises: Seated   Long Arc Quad  AROM;Strengthening;Both;2 sets;10 reps    Long Arc Quad Limitations  Pt seated on Physioball alt. UE/LE with each rep, minimal instability, demonstrated posterior lean while lifling RLE, VC's for proper breathing and to avoid compensation.     Marching  AROM;Strengthening;Both;2 sets;10 reps    Marching Limitations  Seated on Physioball, VC's for proper breathing techniques while lifting RLE.              PT Education - 06/19/18 1027    Education Details  Added RLE heel wedge to daily shoes, educated pt on the importance inserting wedge into each shoe change to  prevent hyperextension during ambulation.     Person(s) Educated  Patient    Methods  Explanation;Demonstration;Verbal cues    Comprehension  Verbalized understanding;Need further instruction;Verbal cues required;Returned demonstration       PT Short Term Goals - 04/24/18 0935      PT SHORT TERM GOAL #1   Title  Improve Berg score from 26/56 to >/= 31/56 to reduce fall risk.    Baseline  9/20: 43/56     Time  4    Period  Weeks    Status  Achieved      PT SHORT TERM GOAL #2   Title  Improve TUG score from 18.09 secs without device to </= 15 secs without device for improved functional mobility and reduced fall risk.    Baseline  9/17: 16.72 secs without AD/AFO donned. Improved.     Time  4    Period  Weeks    Status  Not Met      PT SHORT TERM GOAL #3   Title  Pt will amb. 250' with RW with CGA on flat, even surface for incr. community accessibility.      Baseline  9/17: Pt amb. 250' with RW, Supervision on even surfaces.     Time  4    Period  Weeks    Status  Achieved      PT SHORT TERM GOAL #4   Title  Increase gait velocity from 1.53 ft/sec to >/= 1.9 ft/sec without device for incr. gait efficiency.    Baseline  9/17: 17.44secs = 1.57f./sec with no AD/ AFO donned; Improved.    Time  4    Period  Weeks    Status  Not Met      PT SHORT TERM GOAL #5   Title  Independent in HEP for RLE strengthening and balance exercises.    Baseline  9/20: Pt states she is independent with HEP.    Time  4    Period  Weeks    Status  Achieved        PT Long Term Goals - 06/01/18 1008      PT LONG TERM GOAL #1   Title  Pt will increase Berg score from 26/56 to >/= 36/56 to reduce fall risk.    Baseline  05/17/18: 47/56    Status  Achieved      PT  LONG TERM GOAL #2   Title  AFO consult for RLE will be obtained - and AFO for RLE obtained if determined to be needed.     Baseline  05/17/18: consult is set for 05/24/18    Status  On-going      PT LONG TERM GOAL #3   Title  Pt  will amb. 500' with RW with supervision on flat, even surface for incr. community accessibility.    Status  Achieved      PT LONG TERM GOAL #4   Title  Improve TUG score from 18.09 secs to </= 13.5 secs without device to reduce fall risk.    Baseline  05/17/18: 13.35 sec's with no AD/brace used    Status  Achieved      PT LONG TERM GOAL #5   Title  Increase gait velocity from 1.53 ft/sec to >/= 2.2 ft/sec without device for incr. gait efficiency.     Baseline  05/17/18: 2.37 ft/sec no AD/brace used    Status  Achieved      Additional Long Term Goals   Additional Long Term Goals  Yes      PT LONG TERM GOAL #6   Title  Pt will be modified independent with household ambulation with use of Rt AFO.    Time  4    Period  Weeks    Status  New      PT LONG TERM GOAL #7   Title  Pt will increase Berg balance test score from 47/56 to >/= 50/56 to reduce fall risk.    Baseline  47/56 on 05-17-18    Time  4    Period  Weeks    Status  New      PT LONG TERM GOAL #8   Title  Negotiate 12 steps with 1 hand rail using a step over step sequence with ascension and step by step sequence with descension.     Time  4    Period  Weeks    Status  New      PT LONG TERM GOAL  #9   TITLE  Patient to demonstate appropraite posturing with ability to self-correcct 50% of the time for reduced stress/strain on neck and upper back musculature    Time  4    Period  Weeks    Status  New    Target Date  07/20/18      PT LONG TERM GOAL  #10   TITLE  Patient to improve cervical AROM to [facility] without pain provocation    Time  4    Period  Weeks    Status  New    Target Date  07/20/18      PT LONG TERM GOAL  #11   TITLE  Patient to report reduction in headache frequency and general neck pain by >/= 50%    Time  4    Period  Weeks    Status  New    Target Date  07/20/18            Plan - 06/19/18 1030    Clinical Impression Statement  Todays skilled session focused on Education on/ gait  training with R AFO/heel wedge to increase foot clearance/decrease hyperextension, STS transfers from compliant surface and BLE strengthening.     Rehab Potential  Good    Clinical Impairments Affecting Rehab Potential  chronicity of deficits - approx. 3 yrs since onset (due to transverse myelitis?)    PT Frequency  2x / week    PT Duration  8 weeks    PT Treatment/Interventions  ADLs/Self Care Home Management;DME Instruction;Gait training;Stair training;Orthotic Fit/Training;Patient/family education;Neuromuscular re-education;Balance training;Therapeutic exercise;Therapeutic activities;Passive range of motion;Moist Heat;Traction;Dry needling;Taping;Manual techniques;Spinal Manipulations;Joint Manipulations    PT Next Visit Plan  continue RLE strengthening and gait training with AFO on RLE:  postural training, cervical manual prn, stretching    Consulted and Agree with Plan of Care  Patient;Family member/caregiver    Family Member Consulted  spouse - [name]       Patient will benefit from skilled therapeutic intervention in order to improve the following deficits and impairments:  Abnormal gait, Decreased activity tolerance, Decreased balance, Decreased coordination, Decreased strength, Impaired flexibility, Impaired tone, Impaired UE functional use, Pain, Postural dysfunction, Improper body mechanics, Decreased range of motion, Increased fascial restricitons  Visit Diagnosis: Muscle weakness (generalized)  Other lack of coordination  Unsteadiness on feet     Problem List Patient Active Problem List   Diagnosis Date Noted  . CAD (coronary artery disease) 09/04/2013  . HTN (hypertension) 09/04/2013  . HLD (hyperlipidemia) 09/04/2013  . Tobacco abuse 09/04/2013    , PTA   A  06/19/2018, 10:35 AM  [facility] [address] [place], [place], [address] Phone: [phone]   Fax:   [phone]  Name: Kim Sutton MRN: [id number] Date of Birth: 06-01-1934

## 2018-06-19 NOTE — Therapy (Signed)
[facility] [address] [place], [place], [address] Phone: [phone]   Fax:  [phone]  Occupational Therapy Treatment  Patient Details  Name: Kim Sutton MRN: [id number] Date of Birth: [birth year] No data recorded  Encounter Date: 06/19/2018  OT End of Session - 06/19/18 0838    Visit Number  13    Number of Visits  17    Date for OT Re-Evaluation  06/25/18    Authorization Type  cert. period  04/26/18-07/24/18    Authorization Time Period  Medicare / [place] - Visit Number  13    Authorization - Number of Visits  20    OT Start Time  [phone]    OT Stop Time  0845    OT Time Calculation (min)  42 min       Past Medical History:  Diagnosis Date  . Adrenal cortical adenoma of left adrenal gland    stable since 2009;  MRI in 04/2010 - no further scans  . CAD (coronary artery disease)    a. s/p NSTEMI in 2001 tx with BMS to RCA  . Colon polyps   . HLD (hyperlipidemia)   . Hx of cardiovascular stress test    Lexiscan Myoview (09/2013):  No ischemia, EF 77%, Low Risk.  Marland Kitchen Hyperglycemia   . Hypertension   . Myoclonus    on neurontin per neuro at [facility]  . Nephrolithiasis   . Nodule of right lung    a. 28mm on CXR 10/2009;  CT 05/2010 ok - no further scans  . Tobacco abuse     Past Surgical History:  Procedure Laterality Date  . CHOLECYSTECTOMY    . CORONARY ANGIOPLASTY WITH STENT PLACEMENT    . TOTAL ABDOMINAL HYSTERECTOMY      There were no vitals filed for this visit.  Subjective Assessment - 06/19/18 0837    Pertinent History  PMH:  transverse myelitis, hx of shingles per husband; CAD; HTN, HLD, hard of hearing    Patient Stated Goals  improve ability to pick up things, use RUE more for cleaning, strengthening    Currently in Pain?  No/denies              Treatment: supine closed chain shoulder flexion and chest press, 10-15 reps each, min v.c, pt fatigued quickly Seated low  range closed chain chest press, 10 reps, , min v.c for positioning Standing functional reach outside of base of support to grasp pegs with RUE and place in pegboard, low range, mod v.c to avoid compensation. Arm bike x 6 mins level 1 for conditioning/ reciprocal movement Red putty exercises for sustained grip and pinch, min v.c for positioning and to avoid compensation.               OT Short Term Goals - 05/29/18 0944      OT SHORT TERM GOAL #1   Title  Pt will be independent with coordination, RUE strength HEP.--check STGs 05/25/18    Time  4    Period  Weeks    Status  Achieved      OT SHORT TERM GOAL #2   Title  Pt will improve RUE functional use for eating as shown by improving PPT#2 by at least 5sec.    Baseline  23.34sec    Time  4    Period  Weeks    Status  Achieved   16.0 secs     OT SHORT TERM  GOAL #3   Title  Pt will improve coordination for ADLs as shown by improving time on 9-hole peg test by at least 10sec.    Baseline  59.38sec    Time  4    Period  Weeks    Status  Achieved   05/22/2018  37.69     OT SHORT TERM GOAL #4   Title  Pt will improve R grip strength for opening containers/lifting objects by at least 5lbs.    Baseline  12lbs    Time  4    Period  Weeks    Status  Achieved   05/29/18:  22lbs     OT SHORT TERM GOAL #5   Title  Pt will be able to demo at least 85* R shoulder flex for functional reaching.    Baseline  75*    Time  8    Period  Weeks    Status  On-going   05/19/18 80*       OT Long Term Goals - 06/07/18 0952      OT LONG TERM GOAL #1   Title  Pt will be able to cut food mod I using AE prn.    Time  8    Period  Weeks    Status  Achieved   06/07/18     OT LONG TERM GOAL #2   Title  Pt will be able to use RUE to eat with at least 50% of meal.    Time  8    Period  Weeks    Status  Achieved   06/07/18:  75% per pt/husband      OT LONG TERM GOAL #3   Title  Pt will improve RUE functional  reaching/coordination for ADLs as shown by improving score on box and blocks test by at least 8.    Baseline  22    Time  8    Period  Weeks    Status  On-going      OT LONG TERM GOAL #4   Title  Pt will improve R grip strength for opening containers/lifting objects by at least 10lbs.    Baseline  12lbs    Period  Weeks    Status  On-going      OT LONG TERM GOAL #5   Title  Pt will be able to use RUE for cleaning/home maintenance tasks for at least 70min prior to rest break.    Time  8    Period  Weeks    Status  On-going      OT LONG TERM GOAL #6   Title  Pt will be able to demo at least 95* R shoulder flex for functional reaching.    Baseline  75*    Time  8    Period  Weeks    Status  On-going            Plan - 06/19/18 5916    Clinical Impression Statement  Pt is progressing towards goals. She continues to require verbal cues to avoid compensation.    Occupational Profile and client history currently impacting functional performance  Pt is now limited in IADL performance, particularly when using dominant RUE.  Pt has to switch to nondominant LUE during ADLs/IADLs.      Occupational performance deficits (Please refer to evaluation for details):  ADL's;IADL's;Leisure;Social Participation    Rehab Potential  Good    OT Frequency  2x / week    OT Duration  8 weeks    OT Treatment/Interventions  Self-care/ADL training;Therapeutic exercise;Patient/family education;Neuromuscular education;Moist Heat;Fluidtherapy;Energy conservation;Therapist, nutritional;Therapeutic activities;Balance training;Passive range of motion;Manual Therapy;DME and/or AE instruction;Cryotherapy    Plan  continue with neuro re-ed, RUE functional use, coordination, strength    OT Home Exercise Plan  Education provided:  coordination HEP, putty HEP, ball HEP    Consulted and Agree with Plan of Care  Patient;Family member/caregiver    Family Member Consulted  husband       Patient will benefit  from skilled therapeutic intervention in order to improve the following deficits and impairments:  Decreased knowledge of use of DME, Decreased coordination, Decreased mobility, Decreased strength, Decreased range of motion, Decreased endurance, Decreased activity tolerance, Decreased balance, Impaired UE functional use  Visit Diagnosis: Muscle weakness (generalized)  Other lack of coordination  Abnormal posture  Other abnormalities of gait and mobility    Problem List Patient Active Problem List   Diagnosis Date Noted  . CAD (coronary artery disease) 09/04/2013  . HTN (hypertension) 09/04/2013  . HLD (hyperlipidemia) 09/04/2013  . Tobacco abuse 09/04/2013    Dailey Alberson 06/19/2018, 8:43 AM  [facility] [address] [place], [place], [address] Phone: [phone]   Fax:  [phone]  Name: KWANZA CANCELLIERE MRN: [id number] Date of Birth: 09-10-1933

## 2018-06-21 ENCOUNTER — Ambulatory Visit: Payer: Medicare Other | Admitting: Occupational Therapy

## 2018-06-21 ENCOUNTER — Ambulatory Visit: Payer: Medicare Other | Admitting: Physical Therapy

## 2018-06-21 ENCOUNTER — Encounter: Payer: Self-pay | Admitting: Occupational Therapy

## 2018-06-21 DIAGNOSIS — M6281 Muscle weakness (generalized): Secondary | ICD-10-CM

## 2018-06-21 DIAGNOSIS — M542 Cervicalgia: Secondary | ICD-10-CM | POA: Diagnosis not present

## 2018-06-21 DIAGNOSIS — R2681 Unsteadiness on feet: Secondary | ICD-10-CM

## 2018-06-21 DIAGNOSIS — R278 Other lack of coordination: Secondary | ICD-10-CM

## 2018-06-21 DIAGNOSIS — R293 Abnormal posture: Secondary | ICD-10-CM

## 2018-06-21 DIAGNOSIS — R2689 Other abnormalities of gait and mobility: Secondary | ICD-10-CM

## 2018-06-21 NOTE — Therapy (Signed)
[facility] [address] [place], [place], [address] Phone: [phone]   Fax:  [phone]  Occupational Therapy Treatment  Patient Details  Name: Kim Sutton MRN: [id number] Date of Birth: October 25, 1933 No data recorded  Encounter Date: 06/21/2018  OT End of Session - 06/21/18 0755    Visit Number  14    Number of Visits  17    Date for OT Re-Evaluation  06/25/18    Authorization Type  cert. period  04/26/18-07/24/18    Authorization Time Period  Medicare / [place] - Visit Number  14    Authorization - Number of Visits  20    OT Start Time  0750    OT Stop Time  0830    OT Time Calculation (min)  40 min    Activity Tolerance  Patient tolerated treatment well    Behavior During Therapy  WFL for tasks assessed/performed       Past Medical History:  Diagnosis Date  . Adrenal cortical adenoma of left adrenal gland    stable since 2009;  MRI in 04/2010 - no further scans  . CAD (coronary artery disease)    a. s/p NSTEMI in 2001 tx with BMS to RCA  . Colon polyps   . HLD (hyperlipidemia)   . Hx of cardiovascular stress test    Lexiscan Myoview (09/2013):  No ischemia, EF 77%, Low Risk.  Marland Kitchen Hyperglycemia   . Hypertension   . Myoclonus    on neurontin per neuro at [facility]  . Nephrolithiasis   . Nodule of right lung    a. 59mm on CXR 10/2009;  CT 05/2010 ok - no further scans  . Tobacco abuse     Past Surgical History:  Procedure Laterality Date  . CHOLECYSTECTOMY    . CORONARY ANGIOPLASTY WITH STENT PLACEMENT    . TOTAL ABDOMINAL HYSTERECTOMY      There were no vitals filed for this visit.  Subjective Assessment - 06/21/18 0755    Pertinent History  PMH:  transverse myelitis, hx of shingles per husband; CAD; HTN, HLD, hard of hearing    Patient Stated Goals  improve ability to pick up things, use RUE more for cleaning, strengthening    Currently in Pain?  No/denies         Neuro re-ed:  In  supine, BUEs closed-chain/AAROM shoulder flex and chest press with ball with intermittent min facilitation for normal movement patterns.  In modified quadruped, weight shifts to the R, forward/backward wt. Shifts, min cueing, min facilitation for incr core/scapular stability, incr tricep activation   In sitting, wt. Bearing though hand on mat with body on arm movements, LUE reaching across body for decr tone, incr scapular stability, incr tricep activation.  In standing, rolling ball forward/back on mat for shoulder AAROM with min-mod cueing for compensation.  Therapeutic Activities:  Low range functional reaching in sitting to place/remove various-sized pegs from arc pegboard, min cueing for compensation.  Mid-range functional reaching to place large pegs in vertical pegboard with mod cueing/facilitation for normal movement patterns.   Self-care:   Discussed progress towards goals and pt desires to continue OT due to progress.  Therefore, plan to renew next week.  (see goal section below)        OT Short Term Goals - 06/21/18 2025      OT SHORT TERM GOAL #1   Title  Pt will be independent with coordination, RUE strength HEP.--check STGs  05/25/18    Time  4    Period  Weeks    Status  Achieved      OT SHORT TERM GOAL #2   Title  Pt will improve RUE functional use for eating as shown by improving PPT#2 by at least 5sec.    Baseline  23.34sec    Time  4    Period  Weeks    Status  Achieved   16.0 secs     OT SHORT TERM GOAL #3   Title  Pt will improve coordination for ADLs as shown by improving time on 9-hole peg test by at least 10sec.    Baseline  59.38sec    Time  4    Period  Weeks    Status  Achieved   05/22/2018  37.69     OT SHORT TERM GOAL #4   Title  Pt will improve R grip strength for opening containers/lifting objects by at least 5lbs.    Baseline  12lbs    Time  4    Period  Weeks    Status  Achieved   05/29/18:  22lbs     OT SHORT TERM GOAL #5    Title  Pt will be able to demo at least 85* R shoulder flex for functional reaching.    Baseline  75*    Time  8    Period  Weeks    Status  Achieved   05/19/18 80*, 06/21/18:  90*       OT Long Term Goals - 06/21/18 0810      OT LONG TERM GOAL #1   Title  Pt will be able to cut food mod I using AE prn.    Time  8    Period  Weeks    Status  Achieved   06/07/18     OT LONG TERM GOAL #2   Title  Pt will be able to use RUE to eat with at least 50% of meal.    Time  8    Period  Weeks    Status  Achieved   06/07/18:  75% per pt/husband      OT LONG TERM GOAL #3   Title  Pt will improve RUE functional reaching/coordination for ADLs as shown by improving score on box and blocks test by at least 8.    Baseline  22    Time  8    Period  Weeks    Status  On-going      OT LONG TERM GOAL #4   Title  Pt will improve R grip strength for opening containers/lifting objects by at least 10lbs.    Baseline  12lbs    Period  Weeks    Status  On-going      OT LONG TERM GOAL #5   Title  Pt will be able to use RUE for cleaning/home maintenance tasks for at least 24min prior to rest break.    Time  8    Period  Weeks    Status  On-going      OT LONG TERM GOAL #6   Title  Pt will be able to demo at least 95* R shoulder flex for functional reaching.    Baseline  75*    Time  8    Period  Weeks    Status  On-going   06/21/18:  90*           Plan - 06/21/18 0915  Clinical Impression Statement  Pt is making good progress towards goals with improving RUE functional use and shoulder ROM.    Occupational Profile and client history currently impacting functional performance  Pt is now limited in IADL performance, particularly when using dominant RUE.  Pt has to switch to nondominant LUE during ADLs/IADLs.      Occupational performance deficits (Please refer to evaluation for details):  ADL's;IADL's;Leisure;Social Participation    Rehab Potential  Good    OT Frequency  2x / week     OT Duration  8 weeks    OT Treatment/Interventions  Self-care/ADL training;Therapeutic exercise;Patient/family education;Neuromuscular education;Moist Heat;Fluidtherapy;Energy conservation;Therapist, nutritional;Therapeutic activities;Balance training;Passive range of motion;Manual Therapy;DME and/or AE instruction;Cryotherapy    Plan  check goals and renew next week, scapular stabilty/neuro re-ed, RUE functional use    OT Home Exercise Plan  Education provided:  coordination HEP, putty HEP, ball HEP    Consulted and Agree with Plan of Care  Patient;Family member/caregiver    Family Member Consulted  husband       Patient will benefit from skilled therapeutic intervention in order to improve the following deficits and impairments:  Decreased knowledge of use of DME, Decreased coordination, Decreased mobility, Decreased strength, Decreased range of motion, Decreased endurance, Decreased activity tolerance, Decreased balance, Impaired UE functional use  Visit Diagnosis: Muscle weakness (generalized)  Other lack of coordination  Abnormal posture  Other abnormalities of gait and mobility  Unsteadiness on feet    Problem List Patient Active Problem List   Diagnosis Date Noted  . CAD (coronary artery disease) 09/04/2013  . HTN (hypertension) 09/04/2013  . HLD (hyperlipidemia) 09/04/2013  . Tobacco abuse 09/04/2013    [facility] 06/21/2018, 9:49 AM  [facility] [address] [place], [place], [address] Phone: [phone]   Fax:  [phone]  Name: KAHLEN BOYDE MRN: [id number] Date of Birth: 01/18/1934   Vianne Bulls, OTR/L [facility] [address] [place], [place]  [address] [phone] phone [phone] 06/21/18 9:49 AM

## 2018-06-22 NOTE — Therapy (Signed)
[facility] [address] [place], [place], [address] Phone: [phone]   Fax:  [phone]  Physical Therapy Treatment  Patient Details  Name: Kim Sutton MRN: [id number] Date of Birth: [birth year] Referring Provider (PT): Dr. Lajean Manes   Encounter Date: 06/21/2018  PT End of Session - 06/22/18 1517    Visit Number  25    Number of Visits  32    Date for PT Re-Evaluation  07/20/18    Authorization Type  Medicare and [employer]ARP    Authorization Time Period  03-22-18 - 06-20-18; 05-22-18 - 07-31-18    PT Start Time  0848    PT Stop Time  0932    PT Time Calculation (min)  44 min       Past Medical History:  Diagnosis Date  . Adrenal cortical adenoma of left adrenal gland    stable since 2009;  MRI in 04/2010 - no further scans  . CAD (coronary artery disease)    a. s/p NSTEMI in 2001 tx with BMS to RCA  . Colon polyps   . HLD (hyperlipidemia)   . Hx of cardiovascular stress test    Lexiscan Myoview (09/2013):  No ischemia, EF 77%, Low Risk.  Marland Kitchen Hyperglycemia   . Hypertension   . Myoclonus    on neurontin per neuro at [facility]  . Nephrolithiasis   . Nodule of right lung    a. 96m on CXR 10/2009;  CT 05/2010 ok - no further scans  . Tobacco abuse     Past Surgical History:  Procedure Laterality Date  . CHOLECYSTECTOMY    . CORONARY ANGIOPLASTY WITH STENT PLACEMENT    . TOTAL ABDOMINAL HYSTERECTOMY      There were no vitals filed for this visit.  Subjective Assessment - 06/22/18 1510    Subjective  Pt is not wearing AFO today - states "I think I walk better without it"; pt wearing Skechers shoes with heel wedge in Rt shoe    Patient is accompained by:  Family member    Pertinent History  h/o transverse myelitis;  (husband reports Rt sided weakness is due to pt having had lesion on spine due to shingles approx. 3 yrs ago):  CAD; HTN    Patient Stated Goals  "get up and walk some place" "reduce neck pain"    Currently in Pain?  No/denies                       [facility] Adult PT Treatment/Exercise - 06/22/18 0001      Ambulation/Gait   Ambulation/Gait  Yes    Ambulation/Gait Assistance  4: Min guard    Ambulation/Gait Assistance Details  no AFO; heel wedge in Rt shoe    Ambulation Distance (Feet)  230 Feet    Assistive device  None    Gait Pattern  Decreased hip/knee flexion - right;Decreased dorsiflexion - right;Decreased weight shift to right;Right genu recurvatum;Lateral trunk lean to right;Decreased dorsiflexion - left    Ambulation Surface  Level;Indoor    Stairs  Yes    Stairs Assistance  5: Supervision    Stair Management Technique  One rail Left;Alternating pattern;Step to pattern;Forwards   alternating with ascesnsion; step to with descension    Number of Stairs  4    Height of Stairs  6    Gait Comments  Heel wedge in Rt shoe helped to control Rt knee hyperextension       High  Level Balance   High Level Balance Activities  Side stepping;Other (comment)   squats added to sidestepping - inside // bars     Knee/Hip Exercises: Standing   Heel Raises  Both;1 set;10 reps    Hip Flexion  Right;1 set;10 reps;Stengthening;Knee bent   3# weight 10 reps   Hip Abduction  Stengthening;Right;1 set;10 reps;Knee straight   3# weight    Hip Extension  Stengthening;Right;1 set;10 reps;Knee bent   3# weight used on RLE   Forward Step Up  Right;1 set;10 reps;Hand Hold: 1;Step Height: 6"    Other Standing Knee Exercises  Rt hip and knee flexion with 3# weight in standing    Other Standing Knee Exercises  Lt hip flexion/extension and abduction with LLE 10 reps each for improved RLE SLS - with UE support prn       NeuroRe-ed:  Alternate tap ups to 1st and 2nd step with minimal UE support - 10 reps each foot  Ladder on floor- negotiating with step over step sequence with min hand held assist progressing to less assist - 4 reps  Cone taps to 3 cones with min hand held assist for  balance Touching balance bubble with each foot for improved SLS and Rt knee control with cues to keep knee slightly flexed to reduce knee hyperextension         PT Short Term Goals - 04/24/18 0935      PT SHORT TERM GOAL #1   Title  Improve Berg score from 26/56 to >/= 31/56 to reduce fall risk.    Baseline  9/20: 43/56     Time  4    Period  Weeks    Status  Achieved      PT SHORT TERM GOAL #2   Title  Improve TUG score from 18.09 secs without device to </= 15 secs without device for improved functional mobility and reduced fall risk.    Baseline  9/17: 16.72 secs without AD/AFO donned. Improved.     Time  4    Period  Weeks    Status  Not Met      PT SHORT TERM GOAL #3   Title  Pt will amb. 250' with RW with CGA on flat, even surface for incr. community accessibility.      Baseline  9/17: Pt amb. 250' with RW, Supervision on even surfaces.     Time  4    Period  Weeks    Status  Achieved      PT SHORT TERM GOAL #4   Title  Increase gait velocity from 1.53 ft/sec to >/= 1.9 ft/sec without device for incr. gait efficiency.    Baseline  9/17: 17.44secs = 1.28f./sec with no AD/ AFO donned; Improved.    Time  4    Period  Weeks    Status  Not Met      PT SHORT TERM GOAL #5   Title  Independent in HEP for RLE strengthening and balance exercises.    Baseline  9/20: Pt states she is independent with HEP.    Time  4    Period  Weeks    Status  Achieved        PT Long Term Goals - 06/01/18 1008      PT LONG TERM GOAL #1   Title  Pt will increase Berg score from 26/56 to >/= 36/56 to reduce fall risk.    Baseline  05/17/18: 47/56    Status  Achieved  PT LONG TERM GOAL #2   Title  AFO consult for RLE will be obtained - and AFO for RLE obtained if determined to be needed.     Baseline  05/17/18: consult is set for 05/24/18    Status  On-going      PT LONG TERM GOAL #3   Title  Pt will amb. 500' with RW with supervision on flat, even surface for incr. community  accessibility.    Status  Achieved      PT LONG TERM GOAL #4   Title  Improve TUG score from 18.09 secs to </= 13.5 secs without device to reduce fall risk.    Baseline  05/17/18: 13.35 sec's with no AD/brace used    Status  Achieved      PT LONG TERM GOAL #5   Title  Increase gait velocity from 1.53 ft/sec to >/= 2.2 ft/sec without device for incr. gait efficiency.     Baseline  05/17/18: 2.37 ft/sec no AD/brace used    Status  Achieved      Additional Long Term Goals   Additional Long Term Goals  Yes      PT LONG TERM GOAL #6   Title  Pt will be modified independent with household ambulation with use of Rt AFO.    Time  4    Period  Weeks    Status  New      PT LONG TERM GOAL #7   Title  Pt will increase Berg balance test score from 47/56 to >/= 50/56 to reduce fall risk.    Baseline  47/56 on 05-17-18    Time  4    Period  Weeks    Status  New      PT LONG TERM GOAL #8   Title  Negotiate 12 steps with 1 hand rail using a step over step sequence with ascension and step by step sequence with descension.     Time  4    Period  Weeks    Status  New      PT LONG TERM GOAL  #9   TITLE  Patient to demonstate appropraite posturing with ability to self-correcct 50% of the time for reduced stress/strain on neck and upper back musculature    Time  4    Period  Weeks    Status  New    Target Date  07/20/18      PT LONG TERM GOAL  #10   TITLE  Patient to improve cervical AROM to [facility] without pain provocation    Time  4    Period  Weeks    Status  New    Target Date  07/20/18      PT LONG TERM GOAL  #11   TITLE  Patient to report reduction in headache frequency and general neck pain by >/= 50%    Time  4    Period  Weeks    Status  New    Target Date  07/20/18            Plan - 06/22/18 1517    Clinical Impression Statement  Pt did very well with gait training with use of heel wedge only in Rt shoe (without AFO) and without use of assistive device; pt continues to  need cues to decrease Lt lateral cervical flexion and to hold head upright during gait.  Pt cont to c/o headaches intermittently and may benefit from dry needling.  Pt has progressed well  with D/C from PT addressing balance and gait planned for next week.      Clinical Impairments Affecting Rehab Potential  chronicity of deficits - approx. 3 yrs since onset (due to transverse myelitis?)    PT Frequency  2x / week    PT Duration  8 weeks    PT Treatment/Interventions  ADLs/Self Care Home Management;DME Instruction;Gait training;Stair training;Orthotic Fit/Training;Patient/family education;Neuromuscular re-education;Balance training;Therapeutic exercise;Therapeutic activities;Passive range of motion;Moist Heat;Traction;Dry needling;Taping;Manual techniques;Spinal Manipulations;Joint Manipulations    PT Next Visit Plan  schedule for dry needling? E-stim/ultrasound for Lt upper trap trigger points; check LTG's - may continue PT for cervical pain      PT Home Exercise Plan  [id number] - cervical    Consulted and Agree with Plan of Care  Patient    Family Member Consulted  spouse - [name]       Patient will benefit from skilled therapeutic intervention in order to improve the following deficits and impairments:  Abnormal gait, Decreased activity tolerance, Decreased balance, Decreased coordination, Decreased strength, Impaired flexibility, Impaired tone, Impaired UE functional use, Pain, Postural dysfunction, Improper body mechanics, Decreased range of motion, Increased fascial restricitons  Visit Diagnosis: Muscle weakness (generalized)  Other abnormalities of gait and mobility  Unsteadiness on feet     Problem List Patient Active Problem List   Diagnosis Date Noted  . CAD (coronary artery disease) 09/04/2013  . HTN (hypertension) 09/04/2013  . HLD (hyperlipidemia) 09/04/2013  . Tobacco abuse 09/04/2013    Alda Lea, PT 06/22/2018, 3:31 PM  [facility] [address] [place], [place], [address] Phone: [phone]   Fax:  [phone]  Name: Kim Sutton MRN: [id number] Date of Birth: 08-Feb-1934

## 2018-06-25 ENCOUNTER — Encounter: Payer: Self-pay | Admitting: Occupational Therapy

## 2018-06-25 ENCOUNTER — Ambulatory Visit: Payer: Medicare Other | Admitting: Occupational Therapy

## 2018-06-25 ENCOUNTER — Ambulatory Visit: Payer: Medicare Other | Admitting: Physical Therapy

## 2018-06-25 DIAGNOSIS — M6281 Muscle weakness (generalized): Secondary | ICD-10-CM

## 2018-06-25 DIAGNOSIS — R2681 Unsteadiness on feet: Secondary | ICD-10-CM | POA: Diagnosis not present

## 2018-06-25 DIAGNOSIS — M542 Cervicalgia: Secondary | ICD-10-CM | POA: Diagnosis not present

## 2018-06-25 DIAGNOSIS — R278 Other lack of coordination: Secondary | ICD-10-CM

## 2018-06-25 DIAGNOSIS — R2689 Other abnormalities of gait and mobility: Secondary | ICD-10-CM

## 2018-06-25 DIAGNOSIS — R293 Abnormal posture: Secondary | ICD-10-CM

## 2018-06-25 NOTE — Therapy (Signed)
[facility] [address] [place], [place], [address] Phone: [phone]   Fax:  [phone]  Physical Therapy Treatment  Patient Details  Name: Kim Sutton MRN: [id number] Date of Birth: 05/07/1934 Referring Provider (PT): Dr. Lajean Manes   Encounter Date: 06/25/2018  PT End of Session - 06/25/18 1906    Visit Number  26    Number of Visits  32    Date for PT Re-Evaluation  07/20/18    Authorization Type  Medicare and AARP    Authorization Time Period  03-22-18 - 06-20-18; 05-22-18 - 07-31-18    PT Start Time  0847    PT Stop Time  0930    PT Time Calculation (min)  43 min       Past Medical History:  Diagnosis Date  . Adrenal cortical adenoma of left adrenal gland    stable since 2009;  MRI in 04/2010 - no further scans  . CAD (coronary artery disease)    a. s/p NSTEMI in 2001 tx with BMS to RCA  . Colon polyps   . HLD (hyperlipidemia)   . Hx of cardiovascular stress test    Lexiscan Myoview (09/2013):  No ischemia, EF 77%, Low Risk.  Marland Kitchen Hyperglycemia   . Hypertension   . Myoclonus    on neurontin per neuro at [facility]  . Nephrolithiasis   . Nodule of right lung    a. 26m on CXR 10/2009;  CT 05/2010 ok - no further scans  . Tobacco abuse     Past Surgical History:  Procedure Laterality Date  . CHOLECYSTECTOMY    . CORONARY ANGIOPLASTY WITH STENT PLACEMENT    . TOTAL ABDOMINAL HYSTERECTOMY      There were no vitals filed for this visit.  Subjective Assessment - 06/25/18 1857    Subjective  Pt reports she is tired today, but doesn't know why; reports no changes since previous PT session    Patient is accompained by:  Family member    Pertinent History  h/o transverse myelitis;  (husband reports Rt sided weakness is due to pt having had lesion on spine due to shingles approx. 3 yrs ago):  CAD; HTN    Patient Stated Goals  "get up and walk some place" "reduce neck pain"    Currently in Pain?  No/denies                        [facility] - 06/25/18 0903      Transfers   Transfers  Sit to Stand;Stand to Sit    Sit to Stand  5: Supervision    Sit to Stand Details (indicate cue type and reason)  from high/low mat table; no UE support used    Stand to Sit  5: Supervision    Number of Reps  Other reps (comment)   5     Ambulation/Gait   Ambulation/Gait  Yes    Ambulation/Gait Assistance  4: Min guard    Ambulation/Gait Assistance Details  no AFO, heel wedge in Rt shoe    Ambulation Distance (Feet)  230 Feet    Assistive device  None    Gait Pattern  Decreased hip/knee flexion - right;Decreased dorsiflexion - right;Decreased weight shift to right;Right genu recurvatum;Lateral trunk lean to right;Decreased dorsiflexion - left    Ambulation Surface  Level;Indoor    Stairs  Yes    Stairs Assistance  5: Supervision  Stair Management Technique  One rail Left;Alternating pattern;Forwards;Step to pattern   step to pattern w/descension;  alternating with ascension   Number of Stairs  4    Height of Stairs  6    Gait Comments  Heel wedge in Rt shoe helped to control Rt knee hyperextension       Knee/Hip Exercises: Standing   Heel Raises  Both;1 set;10 reps   RLE only 1 set 10 reps with bil. UE support   Hip Flexion  Right;1 set;10 reps;Stengthening;Knee bent   3# weight 10 reps   Hip Abduction  Stengthening;Right;1 set;10 reps;Knee straight   3# weight    Hip Extension  Stengthening;Right;1 set;10 reps;Knee bent   3# weight used on RLE   Forward Step Up  Right;1 set;10 reps;Hand Hold: 1;Step Height: 6"    Other Standing Knee Exercises  Rt hip and knee flexion with 3# weight in standing      Knee/Hip Exercises: Seated   Long Arc Quad  Strengthening;Right;1 set;10 reps;Weights   6#         Balance Exercises - 06/25/18 1905      Balance Exercises: Standing   Rockerboard  Anterior/posterior;Lateral;10 reps;Intermittent UE support   10 reps  each direction   Step Over Hurdles / Cones  Pt performed stepping over and back of black of balance beam 10 reps each foot with UE support prn    Other Standing Exercises  Pt performed tap ups to 6" step with LLE for improved Rt knee control      Sidestepping with squats inside // bars 10'x 4 reps without UE support Amb. Forwards and backwards on tiptoes 10' inside // bars with UE support prn with SBA    PT Short Term Goals - 04/24/18 0935      PT SHORT TERM GOAL #1   Title  Improve Berg score from 26/56 to >/= 31/56 to reduce fall risk.    Baseline  9/20: 43/56     Time  4    Period  Weeks    Status  Achieved      PT SHORT TERM GOAL #2   Title  Improve TUG score from 18.09 secs without device to </= 15 secs without device for improved functional mobility and reduced fall risk.    Baseline  9/17: 16.72 secs without AD/AFO donned. Improved.     Time  4    Period  Weeks    Status  Not Met      PT SHORT TERM GOAL #3   Title  Pt will amb. 250' with RW with CGA on flat, even surface for incr. community accessibility.      Baseline  9/17: Pt amb. 250' with RW, Supervision on even surfaces.     Time  4    Period  Weeks    Status  Achieved      PT SHORT TERM GOAL #4   Title  Increase gait velocity from 1.53 ft/sec to >/= 1.9 ft/sec without device for incr. gait efficiency.    Baseline  9/17: 17.44secs = 1.45f./sec with no AD/ AFO donned; Improved.    Time  4    Period  Weeks    Status  Not Met      PT SHORT TERM GOAL #5   Title  Independent in HEP for RLE strengthening and balance exercises.    Baseline  9/20: Pt states she is independent with HEP.    Time  4    Period  Weeks    Status  Achieved        PT Long Term Goals - 06/01/18 1008      PT LONG TERM GOAL #1   Title  Pt will increase Berg score from 26/56 to >/= 36/56 to reduce fall risk.    Baseline  05/17/18: 47/56    Status  Achieved      PT LONG TERM GOAL #2   Title  AFO consult for RLE will be obtained -  and AFO for RLE obtained if determined to be needed.     Baseline  05/17/18: consult is set for 05/24/18    Status  On-going      PT LONG TERM GOAL #3   Title  Pt will amb. 500' with RW with supervision on flat, even surface for incr. community accessibility.    Status  Achieved      PT LONG TERM GOAL #4   Title  Improve TUG score from 18.09 secs to </= 13.5 secs without device to reduce fall risk.    Baseline  05/17/18: 13.35 sec's with no AD/brace used    Status  Achieved      PT LONG TERM GOAL #5   Title  Increase gait velocity from 1.53 ft/sec to >/= 2.2 ft/sec without device for incr. gait efficiency.     Baseline  05/17/18: 2.37 ft/sec no AD/brace used    Status  Achieved      Additional Long Term Goals   Additional Long Term Goals  Yes      PT LONG TERM GOAL #6   Title  Pt will be modified independent with household ambulation with use of Rt AFO.    Time  4    Period  Weeks    Status  New      PT LONG TERM GOAL #7   Title  Pt will increase Berg balance test score from 47/56 to >/= 50/56 to reduce fall risk.    Baseline  47/56 on 05-17-18    Time  4    Period  Weeks    Status  New      PT LONG TERM GOAL #8   Title  Negotiate 12 steps with 1 hand rail using a step over step sequence with ascension and step by step sequence with descension.     Time  4    Period  Weeks    Status  New      PT LONG TERM GOAL  #9   TITLE  Patient to demonstate appropraite posturing with ability to self-correcct 50% of the time for reduced stress/strain on neck and upper back musculature    Time  4    Period  Weeks    Status  New    Target Date  07/20/18      PT LONG TERM GOAL  #10   TITLE  Patient to improve cervical AROM to [facility] without pain provocation    Time  4    Period  Weeks    Status  New    Target Date  07/20/18      PT LONG TERM GOAL  #11   TITLE  Patient to report reduction in headache frequency and general neck pain by >/= 50%    Time  4    Period  Weeks    Status   New    Target Date  07/20/18            Plan - 06/25/18 1906  Clinical Impression Statement  Pt continues to improve with gait with use of heel wedge in Rt shoe (pt has AFO for community ambulation distances but does not like to wear for household ambulation);  pt continues to have Rt knee hyperextension which increases with fatigue but pt able to reduce hyperextension with cues.    Rehab Potential  Good    Clinical Impairments Affecting Rehab Potential  chronicity of deficits - approx. 3 yrs since onset (due to transverse myelitis?)    PT Frequency  2x / week    PT Duration  8 weeks    PT Treatment/Interventions  ADLs/Self Care Home Management;DME Instruction;Gait training;Stair training;Orthotic Fit/Training;Patient/family education;Neuromuscular re-education;Balance training;Therapeutic exercise;Therapeutic activities;Passive range of motion;Moist Heat;Traction;Dry needling;Taping;Manual techniques;Spinal Manipulations;Joint Manipulations    PT Next Visit Plan  schedule for dry needling? E-stim/ultrasound for Lt upper trap trigger points; check LTG's - may continue PT for cervical pain      PT Home Exercise Plan  [id number] - cervical    Consulted and Agree with Plan of Care  Patient    Family Member Consulted  spouse - Kim Sutton       Patient will benefit from skilled therapeutic intervention in order to improve the following deficits and impairments:  Abnormal gait, Decreased activity tolerance, Decreased balance, Decreased coordination, Decreased strength, Impaired flexibility, Impaired tone, Impaired UE functional use, Pain, Postural dysfunction, Improper body mechanics, Decreased range of motion, Increased fascial restricitons  Visit Diagnosis: Other abnormalities of gait and mobility  Muscle weakness (generalized)  Unsteadiness on feet     Problem List Patient Active Problem List   Diagnosis Date Noted  . CAD (coronary artery disease) 09/04/2013  . HTN (hypertension)  09/04/2013  . HLD (hyperlipidemia) 09/04/2013  . Tobacco abuse 09/04/2013    Alda Lea, PT 06/25/2018, 7:15 PM  [facility] [address] [place], [place], [address] Phone: [phone]   Fax:  [phone]  Name: Kim Sutton MRN: [id number] Date of Birth: 10-24-1933

## 2018-06-25 NOTE — Therapy (Addendum)
[facility] [address] [place], [place], [address] Phone: [phone]   Fax:  [phone]  Occupational Therapy Treatment  Patient Details  Name: Kim Sutton MRN: [id number] Date of Birth: 06/20/1934 No data recorded  Encounter Date: 06/25/2018  OT End of Session - 06/25/18 0754    Visit Number  15    Number of Visits  24   Date for OT Re-Evaluation  07/27/18    Authorization Type  cert. period  04/26/18-07/24/18    Authorization Time Period  Medicare / [place] - Visit Number  15    Authorization - Number of Visits  20    OT Start Time  [phone]    OT Stop Time  0830    OT Time Calculation (min)  41 min    Activity Tolerance  Patient tolerated treatment well    Behavior During Therapy  WFL for tasks assessed/performed       Past Medical History:  Diagnosis Date  . Adrenal cortical adenoma of left adrenal gland    stable since 2009;  MRI in 04/2010 - no further scans  . CAD (coronary artery disease)    a. s/p NSTEMI in 2001 tx with BMS to RCA  . Colon polyps   . HLD (hyperlipidemia)   . Hx of cardiovascular stress test    Lexiscan Myoview (09/2013):  No ischemia, EF 77%, Low Risk.  Marland Kitchen Hyperglycemia   . Hypertension   . Myoclonus    on neurontin per neuro at [facility]  . Nephrolithiasis   . Nodule of right lung    a. 36mm on CXR 10/2009;  CT 05/2010 ok - no further scans  . Tobacco abuse     Past Surgical History:  Procedure Laterality Date  . CHOLECYSTECTOMY    . CORONARY ANGIOPLASTY WITH STENT PLACEMENT    . TOTAL ABDOMINAL HYSTERECTOMY      There were no vitals filed for this visit.  Subjective Assessment - 06/25/18 0754    Subjective   not awake yet    Pertinent History  PMH:  transverse myelitis, hx of shingles per husband; CAD; HTN, HLD, hard of hearing    Patient Stated Goals  improve ability to pick up things, use RUE more for cleaning, strengthening    Currently in Pain?  No/denies           Neuro re-ed:  In supine, BUEs closed-chain/AAROM shoulder flex and chest press and horizontal abduction/adduction with ball with intermittent min facilitation/cues for normal movement patterns.  In supine, tricep extension AROM with min cueing, chest press open chain with RUE with 1lb wt. And min facilitation for compensation/proper positioning   Therapeutic Activities:  In standing, Mid-range functional reaching to place large pegs in vertical pegboard with min cueing/facilitation for normal movement patterns x81min without rest.   Picking up blocks with gripper set on level 1 (black spring) for sustained grip strength with mod difficulty (to place in bowl positioned at low level), min cues for shoulder/head compensation  Placing grooved pegs in pegboard for incr coordination/in-hand manipulation with min-mod difficulty, min cues for shoulder/head compensation.  Checked box and blocks test, grip strength--see goals section below.           OT Short Term Goals - 06/21/18 0812      OT SHORT TERM GOAL #1   Title  Pt will be independent with coordination, RUE strength HEP.--check STGs 05/25/18    Time  4  Period  Weeks    Status  Achieved      OT SHORT TERM GOAL #2   Title  Pt will improve RUE functional use for eating as shown by improving PPT#2 by at least 5sec.    Baseline  23.34sec    Time  4    Period  Weeks    Status  Achieved   16.0 secs     OT SHORT TERM GOAL #3   Title  Pt will improve coordination for ADLs as shown by improving time on 9-hole peg test by at least 10sec.    Baseline  59.38sec    Time  4    Period  Weeks    Status  Achieved   05/22/2018  37.69     OT SHORT TERM GOAL #4   Title  Pt will improve R grip strength for opening containers/lifting objects by at least 5lbs.    Baseline  12lbs    Time  4    Period  Weeks    Status  Achieved   05/29/18:  22lbs     OT SHORT TERM GOAL #5   Title  Pt will be able to demo at least 85* R  shoulder flex for functional reaching.    Baseline  75*    Time  8    Period  Weeks    Status  Achieved   05/19/18 80*, 06/21/18:  90*       OT Long Term Goals - 06/21/18 0810      OT LONG TERM GOAL #1   Title  Pt will be able to cut food mod I using AE prn.    Time  8    Period  Weeks    Status  Achieved   06/07/18     OT LONG TERM GOAL #2   Title  Pt will be able to use RUE to eat with at least 50% of meal.    Time  8    Period  Weeks    Status  Achieved   06/07/18:  75% per pt/husband      OT LONG TERM GOAL #3   Title  Pt will improve RUE functional reaching/coordination for ADLs as shown by improving score on box and blocks test by at least 8.    Baseline  22    Time  8    Period  Weeks    Status  On-going      OT LONG TERM GOAL #4   Title  Pt will improve R grip strength for opening containers/lifting objects by at least 10lbs.    Baseline  12lbs    Period  Weeks    Status  On-going      OT LONG TERM GOAL #5   Title  Pt will be able to use RUE for cleaning/home maintenance tasks for at least 71min prior to rest break.    Time  8    Period  Weeks    Status  On-going      OT LONG TERM GOAL #6   Title  Pt will be able to demo at least 95* R shoulder flex for functional reaching.    Baseline  75*    Time  8    Period  Weeks    Status  On-going   06/21/18:  90*           Plan - 06/25/18 0754    Clinical Impression Statement  Pt is making good progress towards  goals with improving RUE functional use and shoulder ROM.    Occupational Profile and client history currently impacting functional performance  Pt is now limited in IADL performance, particularly when using dominant RUE.  Pt has to switch to nondominant LUE during ADLs/IADLs.      Occupational performance deficits (Please refer to evaluation for details):  ADL's;IADL's;Leisure;Social Participation    Rehab Potential  Good    OT Frequency  2x / week    OT Duration  8 weeks    OT  Treatment/Interventions  Self-care/ADL training;Therapeutic exercise;Patient/family education;Neuromuscular education;Moist Heat;Fluidtherapy;Energy conservation;Therapist, nutritional;Therapeutic activities;Balance training;Passive range of motion;Manual Therapy;DME and/or AE instruction;Cryotherapy    Plan  renew next session, scapular stabilty/neuro re-ed, RUE functional use    OT Home Exercise Plan  Education provided:  coordination HEP, putty HEP, ball HEP    Consulted and Agree with Plan of Care  Patient;Family member/caregiver    Family Member Consulted  husband       Patient will benefit from skilled therapeutic intervention in order to improve the following deficits and impairments:  Decreased knowledge of use of DME, Decreased coordination, Decreased mobility, Decreased strength, Decreased range of motion, Decreased endurance, Decreased activity tolerance, Decreased balance, Impaired UE functional use  Visit Diagnosis: Muscle weakness (generalized)  Abnormal posture  Other lack of coordination  Other abnormalities of gait and mobility  Unsteadiness on feet    Problem List Patient Active Problem List   Diagnosis Date Noted  . CAD (coronary artery disease) 09/04/2013  . HTN (hypertension) 09/04/2013  . HLD (hyperlipidemia) 09/04/2013  . Tobacco abuse 09/04/2013    [facility] 06/25/2018, 8:02 AM  [facility] [address] [place], [place], [address] Phone: [phone]   Fax:  [phone]  Name: Kim Sutton MRN: [id number] Date of Birth: 11/29/1933   Vianne Bulls, OTR/L [facility] [address] [place], [place]  [address] [phone] phone [phone] 06/25/18 8:02 AM

## 2018-06-26 ENCOUNTER — Encounter: Payer: Self-pay | Admitting: Occupational Therapy

## 2018-06-26 ENCOUNTER — Ambulatory Visit: Payer: Medicare Other | Admitting: Occupational Therapy

## 2018-06-26 ENCOUNTER — Ambulatory Visit: Payer: Medicare Other | Admitting: Physical Therapy

## 2018-06-26 DIAGNOSIS — M6281 Muscle weakness (generalized): Secondary | ICD-10-CM

## 2018-06-26 DIAGNOSIS — R2689 Other abnormalities of gait and mobility: Secondary | ICD-10-CM | POA: Diagnosis not present

## 2018-06-26 DIAGNOSIS — M542 Cervicalgia: Secondary | ICD-10-CM | POA: Diagnosis not present

## 2018-06-26 DIAGNOSIS — R293 Abnormal posture: Secondary | ICD-10-CM | POA: Diagnosis not present

## 2018-06-26 DIAGNOSIS — R278 Other lack of coordination: Secondary | ICD-10-CM

## 2018-06-26 DIAGNOSIS — R2681 Unsteadiness on feet: Secondary | ICD-10-CM

## 2018-06-26 NOTE — Therapy (Signed)
[facility] [address] [place], [place], [address] Phone: [phone]   Fax:  [phone]  Physical Therapy Treatment  Patient Details  Name: Kim Sutton MRN: [id number] Date of Birth: [birth year] Referring Provider (PT): Dr. Lajean Manes   Encounter Date: 06/26/2018  PT End of Session - 06/27/18 1354    Visit Number  27    Number of Visits  32    Date for PT Re-Evaluation  07/20/18    Authorization Type  Medicare and [employer]    Authorization Time Period  03-22-18 - 06-20-18; 05-22-18 - 07-31-18    PT Start Time  0847    PT Stop Time  0932    PT Time Calculation (min)  45 min       Past Medical History:  Diagnosis Date  . Adrenal cortical adenoma of left adrenal gland    stable since 2009;  MRI in 04/2010 - no further scans  . CAD (coronary artery disease)    a. s/p NSTEMI in 2001 tx with BMS to RCA  . Colon polyps   . HLD (hyperlipidemia)   . Hx of cardiovascular stress test    Lexiscan Myoview (09/2013):  No ischemia, EF 77%, Low Risk.  Marland Kitchen Hyperglycemia   . Hypertension   . Myoclonus    on neurontin per neuro at [facility]  . Nephrolithiasis   . Nodule of right lung    a. 35m on CXR 10/2009;  CT 05/2010 ok - no further scans  . Tobacco abuse     Past Surgical History:  Procedure Laterality Date  . CHOLECYSTECTOMY    . CORONARY ANGIOPLASTY WITH STENT PLACEMENT    . TOTAL ABDOMINAL HYSTERECTOMY      There were no vitals filed for this visit.  Subjective Assessment - 06/27/18 1353    Subjective  Pt reports she is doing well; husband states he is pleased with the progress pt has made; plan is to now continue with tx for cervical tightness/headaches    Patient is accompained by:  Family member    Pertinent History  h/o transverse myelitis;  (husband reports Rt sided weakness is due to pt having had lesion on spine due to shingles approx. 3 yrs ago):  CAD; HTN    Patient Stated Goals  "get up and walk some  place" "reduce neck pain"    Currently in Pain?  No/denies       [facility] 06/27/18 0001      Berg Balance Test   Sit to Stand  Able to stand without using hands and stabilize independently    Standing Unsupported  Able to stand safely 2 minutes    Sitting with Back Unsupported but Feet Supported on Floor or Stool  Able to sit safely and securely 2 minutes    Stand to Sit  Sits safely with minimal use of hands    Transfers  Able to transfer safely, minor use of hands    Standing Unsupported with Eyes Closed  Able to stand 10 seconds safely    Standing Ubsupported with Feet Together  Able to place feet together independently and stand 1 minute safely    From Standing, Reach Forward with Outstretched Arm  Can reach confidently >25 cm (10")    From Standing Position, Pick up Object from Floor  Able to pick up shoe safely and easily    From Standing Position, Turn to Look Behind Over each Shoulder  Looks  behind from both sides and weight shifts well    Turn 360 Degrees  Able to turn 360 degrees safely in 4 seconds or less    Standing Unsupported, Alternately Place Feet on Step/Stool  Able to complete >2 steps/needs minimal assist    Standing Unsupported, One Foot in Front  Able to plae foot ahead of the other independently and hold 30 seconds    Standing on One Leg  Able to lift leg independently and hold equal to or more than 3 seconds   3.38   Total Score  50       TUG score 10.72 secs without device  Gait velocity 3.06 ft/sec without device  TherEx;  Pt performed sit to stand 5 reps without UE support from mat;  5 reps with LUE support from mat Heel raises bil. LE's 10 reps with UE support     Gait: pt gait trained 115' without device with cues for increased step length and for increased Rt lateral cervical flexion Step training - pt negotiated 4 steps x 3 reps with use of Lt hand rail using step over step sequence with ascension and Step by step  sequence with descension                PT Short Term Goals - 04/24/18 0935      PT SHORT TERM GOAL #1   Title  Improve Berg score from 26/56 to >/= 31/56 to reduce fall risk.    Baseline  9/20: 43/56     Time  4    Period  Weeks    Status  Achieved      PT SHORT TERM GOAL #2   Title  Improve TUG score from 18.09 secs without device to </= 15 secs without device for improved functional mobility and reduced fall risk.    Baseline  9/17: 16.72 secs without AD/AFO donned. Improved.     Time  4    Period  Weeks    Status  Not Met      PT SHORT TERM GOAL #3   Title  Pt will amb. 250' with RW with CGA on flat, even surface for incr. community accessibility.      Baseline  9/17: Pt amb. 250' with RW, Supervision on even surfaces.     Time  4    Period  Weeks    Status  Achieved      PT SHORT TERM GOAL #4   Title  Increase gait velocity from 1.53 ft/sec to >/= 1.9 ft/sec without device for incr. gait efficiency.    Baseline  9/17: 17.44secs = 1.70f./sec with no AD/ AFO donned; Improved.    Time  4    Period  Weeks    Status  Not Met      PT SHORT TERM GOAL #5   Title  Independent in HEP for RLE strengthening and balance exercises.    Baseline  9/20: Pt states she is independent with HEP.    Time  4    Period  Weeks    Status  Achieved        PT Long Term Goals - 06/26/18 0910      PT LONG TERM GOAL #1   Title  Pt will increase Berg score from 26/56 to >/= 36/56 to reduce fall risk.    Baseline  05/17/18: 47/56    Status  Achieved      PT LONG TERM GOAL #2   Title  AFO  consult for RLE will be obtained - and AFO for RLE obtained if determined to be needed.     Baseline  05/17/18: consult is set for 05/24/18    Status  Achieved      PT LONG TERM GOAL #3   Title  Pt will amb. 500' with RW with supervision on flat, even surface for incr. community accessibility.    Status  Achieved      PT LONG TERM GOAL #4   Title  Improve TUG score from 18.09 secs to </=  13.5 secs without device to reduce fall risk.    Baseline  05/17/18: 13.35 sec's with no AD/brace used; 10.72 secs - 06-26-18 (no device used)    Status  Achieved      PT LONG TERM GOAL #5   Title  Increase gait velocity from 1.53 ft/sec to >/= 2.2 ft/sec without device for incr. gait efficiency.     Baseline  05/17/18: 2.37 ft/sec no AD/brace used; 10.71 sec = 3.06 ft/sec    Status  Achieved      PT LONG TERM GOAL #6   Title  Pt will be modified independent with household ambulation with use of Rt AFO.    Status  Achieved      PT LONG TERM GOAL #7   Title  Pt will increase Berg balance test score from 47/56 to >/= 50/56 to reduce fall risk.    Baseline  47/56 on 05-17-18; score 50/56 on 06-26-18    Status  Achieved      PT LONG TERM GOAL #8   Title  Negotiate 12 steps with 1 hand rail using a step over step sequence with ascension and step by step sequence with descension.     Status  Achieved      PT LONG TERM GOAL  #9   TITLE  Patient to demonstate appropraite posturing with ability to self-correcct 50% of the time for reduced stress/strain on neck and upper back musculature      PT LONG TERM GOAL  #10   TITLE  Patient to improve cervical AROM to [facility] without pain provocation      PT LONG TERM GOAL  #11   TITLE  Patient to report reduction in headache frequency and general neck pain by >/= 50%            Plan - 06/27/18 1400    Clinical Impression Statement  Pt has met LTG's #1-8, with goals #1-5 previously assessed on 05-22-18 and achieved at that time;  upgraded LTG's #6-8 have been achieved with Berg balance test score now 50/56, increased from 47/56 on 05-22-18: gait velocity has increased to 3.06 ft/sec without device.  Pt has received custom AFO for RLE which she uses for comunity ambulation distances, but is not using for household ambulation.  Pt is using heel wedge in Rt shoe which is helping to reduce Rt genu recurvatum in stance.  PT treatment now will focus on  cervical musc. tightness which pt states is producing occasional headaches; pt has trigger point in Rt upper trapezius which is very tender to palpation.  Pt has made excellent progress with improving gait and balance.  Rehab Potential  Good    Clinical Impairments Affecting Rehab Potential  chronicity of deficits - approx. 3 yrs since onset (due to transverse myelitis?)    PT Frequency  2x / week    PT Duration  8 weeks    PT Treatment/Interventions  ADLs/Self Care Home Management;DME Instruction;Gait training;Stair training;Orthotic Fit/Training;Patient/family education;Neuromuscular re-education;Balance training;Therapeutic exercise;Therapeutic activities;Passive range of motion;Moist Heat;Traction;Dry needling;Taping;Manual techniques;Spinal Manipulations;Joint Manipulations    PT Next Visit Plan  schedule for dry needling? E-stim/ultrasound for Lt upper trap trigger --continue PT for cervical pain      PT Home Exercise Plan  [id number] - cervical    Consulted and Agree with Plan of Care  Patient    Family Member Consulted  spouse - [name]       Patient will benefit from skilled therapeutic intervention in order to improve the following deficits and impairments:  Abnormal gait, Decreased activity tolerance, Decreased balance, Decreased coordination, Decreased strength, Impaired flexibility, Impaired tone, Impaired UE functional use, Pain, Postural dysfunction, Improper body mechanics, Decreased range of motion, Increased fascial restricitons  Visit Diagnosis: Muscle weakness (generalized)  Other abnormalities of gait and mobility  Unsteadiness on feet     Problem List Patient Active Problem List   Diagnosis Date Noted  . CAD (coronary artery disease) 09/04/2013  . HTN (hypertension) 09/04/2013  . HLD (hyperlipidemia) 09/04/2013  . Tobacco abuse 09/04/2013    Alda Lea, PT 06/27/2018, 2:12  PM  [facility] [address] [place], [place], [address] Phone: [phone]   Fax:  [phone]  Name: Kim Sutton MRN: [id number] Date of Birth: 1934/06/11

## 2018-06-26 NOTE — Therapy (Addendum)
[facility] [address] [place], [place], [address] Phone: [phone]   Fax:  [phone]  Occupational Therapy Treatment  Patient Details  Name: Kim Sutton MRN: [id number] Date of Birth: 05-29-1934 No data recorded  Encounter Date: 06/26/2018  OT End of Session - 06/26/18 0755    Visit Number  16    Number of Visits  17    Date for OT Re-Evaluation  06/25/18    Authorization Type cert. period  06/26/18-09/24/18    Authorization Time Period  Medicare / [place] - Visit Number  1    Authorization - Number of Visits  10    OT Start Time  [phone]    OT Stop Time  0830    OT Time Calculation (min)  41 min    Activity Tolerance  Patient tolerated treatment well    Behavior During Therapy  WFL for tasks assessed/performed       Past Medical History:  Diagnosis Date  . Adrenal cortical adenoma of left adrenal gland    stable since 2009;  MRI in 04/2010 - no further scans  . CAD (coronary artery disease)    a. s/p NSTEMI in 2001 tx with BMS to RCA  . Colon polyps   . HLD (hyperlipidemia)   . Hx of cardiovascular stress test    Lexiscan Myoview (09/2013):  No ischemia, EF 77%, Low Risk.  Marland Kitchen Hyperglycemia   . Hypertension   . Myoclonus    on neurontin per neuro at [facility]  . Nephrolithiasis   . Nodule of right lung    a. 29m on CXR 10/2009;  CT 05/2010 ok - no further scans  . Tobacco abuse     Past Surgical History:  Procedure Laterality Date  . CHOLECYSTECTOMY    . CORONARY ANGIOPLASTY WITH STENT PLACEMENT    . TOTAL ABDOMINAL HYSTERECTOMY      There were no vitals filed for this visit.  Subjective Assessment - 06/26/18 0754    Subjective   doing ok; using RUE more   Pertinent History  PMH:  transverse myelitis, hx of shingles per husband; CAD; HTN, HLD, hard of hearing    Patient Stated Goals  improve ability to pick up things, use RUE more for cleaning, strengthening    Currently in Pain?   No/denies        Neuro re-ed:  In supine, BUEs closed-chain/AAROM shoulder flex,chest press, diagonals/R abduction, tricep extension with ball with intermittent min facilitation/cues for normal movement patterns.  In modified quadruped, forward/backward wt. Shifts, alternating UE lifts in shoulder extension with scapular retraction with min cueing for normal movement patterns.  Therapeutic Activities:  In standing, Mid-range functional reaching to place small pegs in vertical pegboard with min cueing/facilitation for normal movement patterns for incr coordination and activity tolerance RUE.     Picking up blocks with gripper set on level 1 (black spring) for sustained grip strength with min difficulty/drops (to place in bowl positioned at low level), min cues for shoulder/head compensation           OT Short Term Goals - 06/21/18 0812      OT SHORT TERM GOAL #1   Title  Pt will be independent with coordination, RUE strength HEP.--check STGs 05/25/18    Time  4    Period  Weeks    Status  Achieved      OT SHORT TERM GOAL #2   Title  Pt will  improve RUE functional use for eating as shown by improving PPT#2 by at least 5sec.    Baseline  23.34sec    Time  4    Period  Weeks    Status  Achieved   16.0 secs     OT SHORT TERM GOAL #3   Title  Pt will improve coordination for ADLs as shown by improving time on 9-hole peg test by at least 10sec.    Baseline  59.38sec    Time  4    Period  Weeks    Status  Achieved   05/22/2018  37.69     OT SHORT TERM GOAL #4   Title  Pt will improve R grip strength for opening containers/lifting objects by at least 5lbs.    Baseline  12lbs    Time  4    Period  Weeks    Status  Achieved   05/29/18:  22lbs     OT SHORT TERM GOAL #5   Title  Pt will be able to demo at least 85* R shoulder flex for functional reaching.    Baseline  75*    Time  8    Period  Weeks    Status  Achieved   05/19/18 80*, 06/21/18:  90*       OT  Long Term Goals - 06/25/18 0815      OT LONG TERM GOAL #1   Title  Pt will be able to cut food mod I using AE prn.    Time  8    Period  Weeks    Status  Achieved   06/07/18     OT LONG TERM GOAL #2   Title  Pt will be able to use RUE to eat with at least 50% of meal.    Time  8    Period  Weeks    Status  Achieved   06/07/18:  75% per pt/husband      OT LONG TERM GOAL #3   Title  Pt will improve RUE functional reaching/coordination for ADLs as shown by improving score on box and blocks test by at least 8.    Baseline  22    Time  8    Period  Weeks    Status  On-going   06/25/18:  28 blocks     OT LONG TERM GOAL #4   Title  Pt will improve R grip strength for opening containers/lifting objects by at least 10lbs.    Baseline  12lbs    Period  Weeks    Status  On-going   06/25/18:  16lbs     OT LONG TERM GOAL #5   Title  Pt will be able to use RUE for cleaning/home maintenance tasks for at least 78mn prior to rest break.    Time  8    Period  Weeks    Status  Achieved   06/25/18     OT LONG TERM GOAL #6   Title  Pt will be able to demo at least 95* R shoulder flex for functional reaching.    Baseline  75*    Time  8    Period  Weeks    Status  On-going   06/21/18:  90*           Plan - 06/26/18 0755    Clinical Impression Statement Pt is making good progress towards goals  with improving RUE functional use and activity tolerance despite length of  time  since onset and severity. Pt continues to demo compensation patterns  with fatigue, but decr overall.  Pt would benefit from further  occupational therapy to work on remaining umet LTGs to continue to  maximize dominant RUE functional use for improved ADLs/IADLs (all STGs  met). Progress Note Reporting period: 06/07/18-22/26/19.    Occupational Profile and client history currently impacting functional performance  Pt is now limited in IADL performance, particularly when using dominant RUE.  Pt has to switch  to nondominant LUE during ADLs/IADLs.      Occupational performance deficits (Please refer to evaluation for details):  ADL's;IADL's;Leisure;Social Participation    Rehab Potential  Good    OT Frequency  2x / week    OT Duration  8 weeks    OT Treatment/Interventions  Self-care/ADL training;Therapeutic exercise;Patient/family education;Neuromuscular education;Moist Heat;Fluidtherapy;Energy conservation;Therapist, nutritional;Therapeutic activities;Balance training;Passive range of motion;Manual Therapy;DME and/or AE instruction;Cryotherapy    Plan  neuro re-ed, RUE functional use, scapular stability (renewal completed 06/26/18)    OT Home Exercise Plan  Education provided:  coordination HEP, putty HEP, ball HEP    Consulted and Agree with Plan of Care  Patient;Family member/caregiver    Family Member Consulted  husband       Patient will benefit from skilled therapeutic intervention in order to improve the following deficits and impairments:  Decreased knowledge of use of DME, Decreased coordination, Decreased mobility, Decreased strength, Decreased range of motion, Decreased endurance, Decreased activity tolerance, Decreased balance, Impaired UE functional use  Visit Diagnosis: Muscle weakness (generalized)  Abnormal posture  Other lack of coordination  Other abnormalities of gait and mobility  Unsteadiness on feet    Problem List Patient Active Problem List   Diagnosis Date Noted  . CAD (coronary artery disease) 09/04/2013  . HTN (hypertension) 09/04/2013  . HLD (hyperlipidemia) 09/04/2013  . Tobacco abuse 09/04/2013    [facility] 06/26/2018, 8:07 AM  [facility] [address] [place], [place], [address] Phone: [phone]   Fax:  [phone]  Name: GRAYSEN DEPAULA MRN: [id number] Date of Birth: 1934-02-10   Vianne Bulls, OTR/L [facility] [address] [place], [place]  [address] [phone] phone [phone] 06/26/18 8:20 AM

## 2018-07-04 ENCOUNTER — Ambulatory Visit: Payer: Medicare Other | Attending: Geriatric Medicine | Admitting: Occupational Therapy

## 2018-07-04 DIAGNOSIS — R2681 Unsteadiness on feet: Secondary | ICD-10-CM | POA: Insufficient documentation

## 2018-07-04 DIAGNOSIS — M542 Cervicalgia: Secondary | ICD-10-CM | POA: Diagnosis not present

## 2018-07-04 DIAGNOSIS — M6281 Muscle weakness (generalized): Secondary | ICD-10-CM | POA: Diagnosis not present

## 2018-07-04 DIAGNOSIS — R2689 Other abnormalities of gait and mobility: Secondary | ICD-10-CM | POA: Diagnosis not present

## 2018-07-04 DIAGNOSIS — R278 Other lack of coordination: Secondary | ICD-10-CM | POA: Insufficient documentation

## 2018-07-04 DIAGNOSIS — R293 Abnormal posture: Secondary | ICD-10-CM | POA: Insufficient documentation

## 2018-07-04 NOTE — Therapy (Signed)
[facility] [address] [place], [place], [address] Phone: [phone]   Fax:  [phone]  Occupational Therapy Treatment  Patient Details  Name: Kim Sutton MRN: [id number] Date of Birth: 01/25/1934 No data recorded  Encounter Date: 07/04/2018  OT End of Session - 07/04/18 0810    Visit Number  17    Number of Visits  24    Date for OT Re-Evaluation  07/25/18    Authorization Type  cert. period  06/26/18-09/24/18    Authorization Time Period  Medicare / [place]    Authorization - Visit Number  2    Authorization - Number of Visits  10    OT Start Time  0805    OT Stop Time  0845    OT Time Calculation (min)  40 min    Activity Tolerance  Patient tolerated treatment well    Behavior During Therapy  WFL for tasks assessed/performed       Past Medical History:  Diagnosis Date  . Adrenal cortical adenoma of left adrenal gland    stable since 2009;  MRI in 04/2010 - no further scans  . CAD (coronary artery disease)    a. s/p NSTEMI in 2001 tx with BMS to RCA  . Colon polyps   . HLD (hyperlipidemia)   . Hx of cardiovascular stress test    Lexiscan Myoview (09/2013):  No ischemia, EF 77%, Low Risk.  Marland Kitchen Hyperglycemia   . Hypertension   . Myoclonus    on neurontin per neuro at [facility]  . Nephrolithiasis   . Nodule of right lung    a. 38mm on CXR 10/2009;  CT 05/2010 ok - no further scans  . Tobacco abuse     Past Surgical History:  Procedure Laterality Date  . CHOLECYSTECTOMY    . CORONARY ANGIOPLASTY WITH STENT PLACEMENT    . TOTAL ABDOMINAL HYSTERECTOMY      There were no vitals filed for this visit.  Subjective Assessment - 07/04/18 0807    Subjective   denies pain    Pertinent History  PMH:  transverse myelitis, hx of shingles per husband; CAD; HTN, HLD, hard of hearing    Patient Stated Goals  improve ability to pick up things, use RUE more for cleaning, strengthening    Currently in Pain?  No/denies                   Neuro re-ed:  In supine, BUEs closed-chain/AAROM shoulder flex,chest press, diagonals/R abduction, tricep extension with ball with intermittent min-mod  facilitation/cues for normal movement patterns.Pt reports increased stiffness this a.m.  In modified quadruped, forward/backward wt. Shifts, alternating UE lifts in shoulder extension with scapular retraction with min cueing for normal movement patterns.  Therapeutic Activities:   In standing, Mid-range functional reaching to placelargepegs in vertical pegboard with min cueing/facilitation for normal movement patterns for incr coordination and activity tolerance RUE.    In standing, functional mid-range reach  To place/ remove graded clothespins from vertical antennae with RUE for sustained pinch, min facillitation to avoid compensation.                     OT Short Term Goals - 06/21/18 0812      OT SHORT TERM GOAL #1   Title  Pt will be independent with coordination, RUE strength HEP.--check STGs 05/25/18    Time  4    Period  Weeks    Status  Achieved  OT SHORT TERM GOAL #2   Title  Pt will improve RUE functional use for eating as shown by improving PPT#2 by at least 5sec.    Baseline  23.34sec    Time  4    Period  Weeks    Status  Achieved   16.0 secs     OT SHORT TERM GOAL #3   Title  Pt will improve coordination for ADLs as shown by improving time on 9-hole peg test by at least 10sec.    Baseline  59.38sec    Time  4    Period  Weeks    Status  Achieved   05/22/2018  37.69     OT SHORT TERM GOAL #4   Title  Pt will improve R grip strength for opening containers/lifting objects by at least 5lbs.    Baseline  12lbs    Time  4    Period  Weeks    Status  Achieved   05/29/18:  22lbs     OT SHORT TERM GOAL #5   Title  Pt will be able to demo at least 85* R shoulder flex for functional reaching.    Baseline  75*    Time  8    Period  Weeks    Status  Achieved    05/19/18 80*, 06/21/18:  90*       OT Long Term Goals - 06/25/18 0815      OT LONG TERM GOAL #1   Title  Pt will be able to cut food mod I using AE prn.    Time  8    Period  Weeks    Status  Achieved   06/07/18     OT LONG TERM GOAL #2   Title  Pt will be able to use RUE to eat with at least 50% of meal.    Time  8    Period  Weeks    Status  Achieved   06/07/18:  75% per pt/husband      OT LONG TERM GOAL #3   Title  Pt will improve RUE functional reaching/coordination for ADLs as shown by improving score on box and blocks test by at least 8.    Baseline  22    Time  8    Period  Weeks    Status  On-going   06/25/18:  28 blocks     OT LONG TERM GOAL #4   Title  Pt will improve R grip strength for opening containers/lifting objects by at least 10lbs.    Baseline  12lbs    Period  Weeks    Status  On-going   06/25/18:  16lbs     OT LONG TERM GOAL #5   Title  Pt will be able to use RUE for cleaning/home maintenance tasks for at least 12min prior to rest break.    Time  8    Period  Weeks    Status  Achieved   06/25/18     OT LONG TERM GOAL #6   Title  Pt will be able to demo at least 95* R shoulder flex for functional reaching.    Baseline  75*    Time  8    Period  Weeks    Status  On-going   06/21/18:  90*           Plan - 07/04/18 0858    Clinical Impression Statement  Pt is progressing towards goals. She reports increased difficulty  with movement this moring since it is cold outside. Pt reports feeling better and looser by end of session    Occupational Profile and client history currently impacting functional performance  Pt is now limited in IADL performance, particularly when using dominant RUE.  Pt has to switch to nondominant LUE during ADLs/IADLs.      Occupational performance deficits (Please refer to evaluation for details):  ADL's;IADL's;Leisure;Social Participation    Rehab Potential  Good    OT Frequency  2x / week    OT Duration  8 weeks     OT Treatment/Interventions  Self-care/ADL training;Therapeutic exercise;Patient/family education;Neuromuscular education;Moist Heat;Fluidtherapy;Energy conservation;Therapist, nutritional;Therapeutic activities;Balance training;Passive range of motion;Manual Therapy;DME and/or AE instruction;Cryotherapy    Plan  neuro re-ed, RUE functional use, scapular stability     Consulted and Agree with Plan of Care  Patient;Family member/caregiver    Family Member Consulted  husband       Patient will benefit from skilled therapeutic intervention in order to improve the following deficits and impairments:  Decreased knowledge of use of DME, Decreased coordination, Decreased mobility, Decreased strength, Decreased range of motion, Decreased endurance, Decreased activity tolerance, Decreased balance, Impaired UE functional use  Visit Diagnosis: Muscle weakness (generalized)  Abnormal posture  Other lack of coordination  Other abnormalities of gait and mobility    Problem List Patient Active Problem List   Diagnosis Date Noted  . CAD (coronary artery disease) 09/04/2013  . HTN (hypertension) 09/04/2013  . HLD (hyperlipidemia) 09/04/2013  . Tobacco abuse 09/04/2013    [facility] 07/04/2018, 9:01 AM  [facility] [address] [place], [place], [address] Phone: [phone]   Fax:  [phone]  Name: Kim Sutton MRN: [id number] Date of Birth: [birth year]

## 2018-07-09 ENCOUNTER — Ambulatory Visit: Payer: Medicare Other | Admitting: Occupational Therapy

## 2018-07-09 ENCOUNTER — Encounter: Payer: Self-pay | Admitting: Occupational Therapy

## 2018-07-09 DIAGNOSIS — R293 Abnormal posture: Secondary | ICD-10-CM | POA: Diagnosis not present

## 2018-07-09 DIAGNOSIS — R2689 Other abnormalities of gait and mobility: Secondary | ICD-10-CM | POA: Diagnosis not present

## 2018-07-09 DIAGNOSIS — M542 Cervicalgia: Secondary | ICD-10-CM | POA: Diagnosis not present

## 2018-07-09 DIAGNOSIS — M6281 Muscle weakness (generalized): Secondary | ICD-10-CM

## 2018-07-09 DIAGNOSIS — R278 Other lack of coordination: Secondary | ICD-10-CM | POA: Diagnosis not present

## 2018-07-09 DIAGNOSIS — R2681 Unsteadiness on feet: Secondary | ICD-10-CM | POA: Diagnosis not present

## 2018-07-09 NOTE — Therapy (Signed)
[facility] [address] [place], [place], [address] Phone: [phone]   Fax:  [phone]  Occupational Therapy Treatment  Patient Details  Name: NAVADA OSTERHOUT MRN: [id number] Date of Birth: 1934-01-05 No data recorded  Encounter Date: 07/09/2018  OT End of Session - 07/09/18 0756    Visit Number  18    Number of Visits  24    Date for OT Re-Evaluation  07/25/18    Authorization Type  cert. period  06/26/18-09/24/18    Authorization Time Period  Medicare / [place]    Authorization - Visit Number  3    Authorization - Number of Visits  10    OT Start Time  [phone]    OT Stop Time  [phone]    OT Time Calculation (min)  40 min    Activity Tolerance  Patient tolerated treatment well    Behavior During Therapy  [facility] for tasks assessed/performed       Past Medical History:  Diagnosis Date  . Adrenal cortical adenoma of left adrenal gland    stable since 2009;  MRI in 04/2010 - no further scans  . CAD (coronary artery disease)    a. s/p NSTEMI in 2001 tx with BMS to RCA  . Colon polyps   . HLD (hyperlipidemia)   . Hx of cardiovascular stress test    Lexiscan Myoview (09/2013):  No ischemia, EF 77%, Low Risk.  Marland Kitchen Hyperglycemia   . Hypertension   . Myoclonus    on neurontin per neuro at [facility]  . Nephrolithiasis   . Nodule of right lung    a. 43mm on CXR 10/2009;  CT 05/2010 ok - no further scans  . Tobacco abuse     Past Surgical History:  Procedure Laterality Date  . CHOLECYSTECTOMY    . CORONARY ANGIOPLASTY WITH STENT PLACEMENT    . TOTAL ABDOMINAL HYSTERECTOMY      There were no vitals filed for this visit.  Subjective Assessment - 07/09/18 0756    Subjective   denies pain    Pertinent History  PMH:  transverse myelitis, hx of shingles per husband; CAD; HTN, HLD, hard of hearing    Patient Stated Goals  improve ability to pick up things, use RUE more for cleaning, strengthening    Currently in Pain?  No/denies        Neuro re-ed:  In supine, BUEs closed-chain/AAROM shoulder flex,chest press, diagonals/R abduction, with ball with intermittent min-mod  facilitation/cues for normal movement patterns.   In modified quadruped, forward/backward wt. Shifts with min cueing for alignment/head position.  In standing, shoulder flex AAROM with UE ranger for higher level reach with min cueing for decr compensation.   Therapeutic Activities:  In standing, Mid-range functional reaching to place medium pegs in vertical pegboard with min cueing for normal movement patterns for incr coordination and activity tolerance RUE.     Flipping cards incorporating reaching across body and laterally (low range) with ER and trunk/neck rotation to the right, supination for improved shoulder positioning with min cues and incr activity tolerance.     Picking up blocks with gripper set on level 1 (black spring) for sustained grip strength with mod difficulty and low range functional reach.                      OT Short Term Goals - 06/21/18 0812      OT SHORT TERM GOAL #1   Title  Pt will be independent with coordination, RUE strength HEP.--check STGs 05/25/18    Time  4    Period  Weeks    Status  Achieved      OT SHORT TERM GOAL #2   Title  Pt will improve RUE functional use for eating as shown by improving PPT#2 by at least 5sec.    Baseline  23.34sec    Time  4    Period  Weeks    Status  Achieved   16.0 secs     OT SHORT TERM GOAL #3   Title  Pt will improve coordination for ADLs as shown by improving time on 9-hole peg test by at least 10sec.    Baseline  59.38sec    Time  4    Period  Weeks    Status  Achieved   05/22/2018  37.69     OT SHORT TERM GOAL #4   Title  Pt will improve R grip strength for opening containers/lifting objects by at least 5lbs.    Baseline  12lbs    Time  4    Period  Weeks    Status  Achieved   05/29/18:  22lbs     OT SHORT TERM GOAL #5   Title  Pt will  be able to demo at least 85* R shoulder flex for functional reaching.    Baseline  75*    Time  8    Period  Weeks    Status  Achieved   05/19/18 80*, 06/21/18:  90*       OT Long Term Goals - 06/25/18 0815      OT LONG TERM GOAL #1   Title  Pt will be able to cut food mod I using AE prn.    Time  8    Period  Weeks    Status  Achieved   06/07/18     OT LONG TERM GOAL #2   Title  Pt will be able to use RUE to eat with at least 50% of meal.    Time  8    Period  Weeks    Status  Achieved   06/07/18:  75% per pt/husband      OT LONG TERM GOAL #3   Title  Pt will improve RUE functional reaching/coordination for ADLs as shown by improving score on box and blocks test by at least 8.    Baseline  22    Time  8    Period  Weeks    Status  On-going   06/25/18:  28 blocks     OT LONG TERM GOAL #4   Title  Pt will improve R grip strength for opening containers/lifting objects by at least 10lbs.    Baseline  12lbs    Period  Weeks    Status  On-going   06/25/18:  16lbs     OT LONG TERM GOAL #5   Title  Pt will be able to use RUE for cleaning/home maintenance tasks for at least 66min prior to rest break.    Time  8    Period  Weeks    Status  Achieved   06/25/18     OT LONG TERM GOAL #6   Title  Pt will be able to demo at least 95* R shoulder flex for functional reaching.    Baseline  75*    Time  8    Period  Weeks    Status  On-going  06/21/18:  90*           Plan - 07/09/18 0800    Occupational Profile and client history currently impacting functional performance  Pt is now limited in IADL performance, particularly when using dominant RUE.  Pt has to switch to nondominant LUE during ADLs/IADLs.      Occupational performance deficits (Please refer to evaluation for details):  ADL's;IADL's;Leisure;Social Participation    Rehab Potential  Good    OT Frequency  2x / week    OT Duration  8 weeks    OT Treatment/Interventions  Self-care/ADL training;Therapeutic  exercise;Patient/family education;Neuromuscular education;Moist Heat;Fluidtherapy;Energy conservation;Therapist, nutritional;Therapeutic activities;Balance training;Passive range of motion;Manual Therapy;DME and/or AE instruction;Cryotherapy    Plan  neuro re-ed, RUE functional use, scapular stability     Consulted and Agree with Plan of Care  Patient;Family member/caregiver    Family Member Consulted  husband       Patient will benefit from skilled therapeutic intervention in order to improve the following deficits and impairments:  Decreased knowledge of use of DME, Decreased coordination, Decreased mobility, Decreased strength, Decreased range of motion, Decreased endurance, Decreased activity tolerance, Decreased balance, Impaired UE functional use  Visit Diagnosis: Muscle weakness (generalized)  Abnormal posture  Other lack of coordination  Other abnormalities of gait and mobility  Unsteadiness on feet    Problem List Patient Active Problem List   Diagnosis Date Noted  . CAD (coronary artery disease) 09/04/2013  . HTN (hypertension) 09/04/2013  . HLD (hyperlipidemia) 09/04/2013  . Tobacco abuse 09/04/2013    [facility] 07/09/2018, 8:00 AM  [facility] [address] [place], [place], [address] Phone: [phone]   Fax:  [phone]  Name: CLARABELL MATSUOKA MRN: [id number] Date of Birth: 1934-01-12   Vianne Bulls, OTR/L [facility] [address] [place], [place]  [address] [phone] phone [phone] 07/09/18 1:25 PM

## 2018-07-11 ENCOUNTER — Encounter: Payer: Self-pay | Admitting: Physical Therapy

## 2018-07-11 ENCOUNTER — Ambulatory Visit: Payer: Medicare Other | Admitting: Physical Therapy

## 2018-07-11 ENCOUNTER — Ambulatory Visit: Payer: Medicare Other | Admitting: Occupational Therapy

## 2018-07-11 DIAGNOSIS — R293 Abnormal posture: Secondary | ICD-10-CM | POA: Diagnosis not present

## 2018-07-11 DIAGNOSIS — M6281 Muscle weakness (generalized): Secondary | ICD-10-CM

## 2018-07-11 DIAGNOSIS — R278 Other lack of coordination: Secondary | ICD-10-CM | POA: Diagnosis not present

## 2018-07-11 DIAGNOSIS — R2689 Other abnormalities of gait and mobility: Secondary | ICD-10-CM | POA: Diagnosis not present

## 2018-07-11 DIAGNOSIS — R2681 Unsteadiness on feet: Secondary | ICD-10-CM | POA: Diagnosis not present

## 2018-07-11 DIAGNOSIS — M542 Cervicalgia: Secondary | ICD-10-CM

## 2018-07-11 NOTE — Therapy (Signed)
[facility] [address] [place], [place], [address] Phone: [phone]   Fax:  [phone]  Occupational Therapy Treatment  Patient Details  Name: Kim Sutton MRN: [id number] Date of Birth: [birth year] No data recorded  Encounter Date: 07/11/2018  OT End of Session - 07/11/18 1252    Visit Number  19    Number of Visits  24    Authorization Type  cert. period  06/26/18-09/24/18    Authorization - Visit Number  4    Authorization - Number of Visits  10    OT Start Time  [phone]    OT Stop Time  0930    OT Time Calculation (min)  40 min    Activity Tolerance  Patient tolerated treatment well    Behavior During Therapy  [facility] for tasks assessed/performed       Past Medical History:  Diagnosis Date  . Adrenal cortical adenoma of left adrenal gland    stable since 2009;  MRI in 04/2010 - no further scans  . CAD (coronary artery disease)    a. s/p NSTEMI in 2001 tx with BMS to RCA  . Colon polyps   . HLD (hyperlipidemia)   . Hx of cardiovascular stress test    Lexiscan Myoview (09/2013):  No ischemia, EF 77%, Low Risk.  Marland Kitchen Hyperglycemia   . Hypertension   . Myoclonus    on neurontin per neuro at [facility]  . Nephrolithiasis   . Nodule of right lung    a. 41mm on CXR 10/2009;  CT 05/2010 ok - no further scans  . Tobacco abuse     Past Surgical History:  Procedure Laterality Date  . CHOLECYSTECTOMY    . CORONARY ANGIOPLASTY WITH STENT PLACEMENT    . TOTAL ABDOMINAL HYSTERECTOMY      There were no vitals filed for this visit.  Subjective Assessment - 07/11/18 1255    Subjective   denies pain    Limitations  fall risk, hard of hearing    Patient Stated Goals  improve ability to pick up things, use RUE more for cleaning, strengthening    Currently in Pain?  No/denies                   Neuro re-ed:  In supine, BUEs closed-chain/AAROM shoulder flex,chest press, diagonals/R abduction, with frame and ball  with intermittent min-mod  facilitation/cues for normal movement patterns.   In modified quadruped, forward/backward wt. Shifts with min cueing for alignment/head position then modified push up  In standing, shoulder flex AAROM rolling ball forwards and backwards at mat  Therapeutic Activities:  In standing, Mid-range functional reaching to place discs on various height targets min cueing for normal movement patterns for incr coordination and activity tolerance RUE.               OT Short Term Goals - 06/21/18 0812      OT SHORT TERM GOAL #1   Title  Pt will be independent with coordination, RUE strength HEP.--check STGs 05/25/18    Time  4    Period  Weeks    Status  Achieved      OT SHORT TERM GOAL #2   Title  Pt will improve RUE functional use for eating as shown by improving PPT#2 by at least 5sec.    Baseline  23.34sec    Time  4    Period  Weeks    Status  Achieved   16.0 secs  OT SHORT TERM GOAL #3   Title  Pt will improve coordination for ADLs as shown by improving time on 9-hole peg test by at least 10sec.    Baseline  59.38sec    Time  4    Period  Weeks    Status  Achieved   05/22/2018  37.69     OT SHORT TERM GOAL #4   Title  Pt will improve R grip strength for opening containers/lifting objects by at least 5lbs.    Baseline  12lbs    Time  4    Period  Weeks    Status  Achieved   05/29/18:  22lbs     OT SHORT TERM GOAL #5   Title  Pt will be able to demo at least 85* R shoulder flex for functional reaching.    Baseline  75*    Time  8    Period  Weeks    Status  Achieved   05/19/18 80*, 06/21/18:  90*       OT Long Term Goals - 06/25/18 0815      OT LONG TERM GOAL #1   Title  Pt will be able to cut food mod I using AE prn.    Time  8    Period  Weeks    Status  Achieved   06/07/18     OT LONG TERM GOAL #2   Title  Pt will be able to use RUE to eat with at least 50% of meal.    Time  8    Period  Weeks    Status  Achieved    06/07/18:  75% per pt/husband      OT LONG TERM GOAL #3   Title  Pt will improve RUE functional reaching/coordination for ADLs as shown by improving score on box and blocks test by at least 8.    Baseline  22    Time  8    Period  Weeks    Status  On-going   06/25/18:  28 blocks     OT LONG TERM GOAL #4   Title  Pt will improve R grip strength for opening containers/lifting objects by at least 10lbs.    Baseline  12lbs    Period  Weeks    Status  On-going   06/25/18:  16lbs     OT LONG TERM GOAL #5   Title  Pt will be able to use RUE for cleaning/home maintenance tasks for at least 42min prior to rest break.    Time  8    Period  Weeks    Status  Achieved   06/25/18     OT LONG TERM GOAL #6   Title  Pt will be able to demo at least 95* R shoulder flex for functional reaching.    Baseline  75*    Time  8    Period  Weeks    Status  On-going   06/21/18:  90*           Plan - 07/11/18 1254    Clinical Impression Statement  Pt is progressing towards goals.  She is demonstrating improving activity tolerance, but was tired from a busy day yesterday    Occupational Profile and client history currently impacting functional performance  Pt is now limited in IADL performance, particularly when using dominant RUE.  Pt has to switch to nondominant LUE during ADLs/IADLs.      Occupational performance deficits (Please refer to evaluation  for details):  ADL's;IADL's;Leisure;Social Participation    Rehab Potential  Good    OT Frequency  2x / week    OT Duration  8 weeks    OT Treatment/Interventions  Self-care/ADL training;Therapeutic exercise;Patient/family education;Neuromuscular education;Moist Heat;Fluidtherapy;Energy conservation;Therapist, nutritional;Therapeutic activities;Balance training;Passive range of motion;Manual Therapy;DME and/or AE instruction;Cryotherapy    Plan  neuro re-ed, RUE functional use, scapular stability     Consulted and Agree with Plan of Care   Patient;Family member/caregiver    Family Member Consulted  husband       Patient will benefit from skilled therapeutic intervention in order to improve the following deficits and impairments:  Decreased knowledge of use of DME, Decreased coordination, Decreased mobility, Decreased strength, Decreased range of motion, Decreased endurance, Decreased activity tolerance, Decreased balance, Impaired UE functional use  Visit Diagnosis: Muscle weakness (generalized)  Abnormal posture  Other lack of coordination  Other abnormalities of gait and mobility  Unsteadiness on feet    Problem List Patient Active Problem List   Diagnosis Date Noted  . CAD (coronary artery disease) 09/04/2013  . HTN (hypertension) 09/04/2013  . HLD (hyperlipidemia) 09/04/2013  . Tobacco abuse 09/04/2013    RINE,KATHRYN 07/11/2018, 12:56 PM  [facility] [address] [place], [place], [address] Phone: [phone]   Fax:  [phone]  Name: Kim Sutton MRN: [id number] Date of Birth: 1934-07-19

## 2018-07-12 NOTE — Addendum Note (Signed)
Addended by: Lamar Benes on: 07/12/2018 05:20 PM   Modules accepted: Orders

## 2018-07-12 NOTE — Addendum Note (Signed)
Addended by: Lamar Benes on: 07/12/2018 12:30 PM   Modules accepted: Orders

## 2018-07-12 NOTE — Therapy (Signed)
[facility] [address] [place], [place], [address] Phone: [phone]   Fax:  [phone]  Physical Therapy Treatment  Patient Details  Name: Kim Sutton MRN: [id number] Date of Birth: 1934-07-09 Referring Provider (PT): Dr. Lajean Manes   Encounter Date: 07/11/2018  PT End of Session - 07/11/18 0934    Visit Number  28    Number of Visits  32    Date for PT Re-Evaluation  07/20/18    Authorization Type  Medicare and [employer]    Authorization Time Period  03-22-18 - 06-20-18; 05-22-18 - 07-31-18    PT Start Time  0932    PT Stop Time  1015    PT Time Calculation (min)  43 min    Activity Tolerance  Patient tolerated treatment well    Behavior During Therapy  [facility] for tasks assessed/performed       Past Medical History:  Diagnosis Date  . Adrenal cortical adenoma of left adrenal gland    stable since 2009;  MRI in 04/2010 - no further scans  . CAD (coronary artery disease)    a. s/p NSTEMI in 2001 tx with BMS to RCA  . Colon polyps   . HLD (hyperlipidemia)   . Hx of cardiovascular stress test    Lexiscan Myoview (09/2013):  No ischemia, EF 77%, Low Risk.  Marland Kitchen Hyperglycemia   . Hypertension   . Myoclonus    on neurontin per neuro at [facility]  . Nephrolithiasis   . Nodule of right lung    a. 29m on CXR 10/2009;  CT 05/2010 ok - no further scans  . Tobacco abuse     Past Surgical History:  Procedure Laterality Date  . CHOLECYSTECTOMY    . CORONARY ANGIOPLASTY WITH STENT PLACEMENT    . TOTAL ABDOMINAL HYSTERECTOMY      There were no vitals filed for this visit.  Subjective Assessment - 07/11/18 0933    Subjective  Taking Tylenol up to 4 times a day [place] keep [place] Denies any pain currently, does still have have muslce tightness on left>right side.     Patient is accompained by:  Family member    Pertinent History  h/o transverse myelitis;  (husband reports Rt sided weakness is due to pt having had  lesion on spine due to shingles approx. 3 yrs ago):  CAD; HTN    Patient Stated Goals  "get up and walk some place" "reduce neck pain"    Currently in Pain?  No/denies    Pain Score  0-No pain             [facility] Adult PT Treatment/Exercise - 07/11/18 0935      Modalities   Modalities  Ultrasound      Ultrasound   Ultrasound Location  left upper trap/rhomboid    Ultrasound Parameters  100%, 1.5 w/cm2, 1 mhz x 10 minutes    Ultrasound Goals  Pain      Manual Therapy   Manual Therapy  Soft tissue mobilization;Myofascial release;Scapular mobilization;Manual Traction;Neural Stretch;Muscle Energy Technique    Soft tissue mobilization  left upper rap, scalenes, Rhomboids    Myofascial Release  to upper traps, rhomboids, scalenes and STM muscles.    Scapular Mobilization  in right sidelying: scapular mob passively in all directions for muslce stetching.     Manual Traction  suboccipital release for 30 sec's x 4-5 reps; then manual cervical traction for proloned holds after 2-3 reps for 15-20  sec's.     Muscle Energy Technique  concurrent with manual cervical distraction: scapular retraction for 10 reps, then scapular depression  (punching toward feet) x 10 reps.     Neural Stretch  passive stretching of left upper trap with passive cervical rotation toward the right with manual overpressure through left shoulder      Neck Exercises: Stretches   Upper Trapezius Stretch  Left;2 reps;30 seconds;Limitations    Other Neck Stretches  seated edge of mat: posterior shoulder rolls x 5 reps, scapular retraction for 5 sec holds x 5 reps. cues needed on form/technique.                PT Short Term Goals - 04/24/18 0935      PT SHORT TERM GOAL #1   Title  Improve Berg score from 26/56 to >/= 31/56 to reduce fall risk.    Baseline  9/20: 43/56     Time  4    Period  Weeks    Status  Achieved      PT SHORT TERM GOAL #2   Title  Improve TUG score from 18.09 secs without device to </= 15  secs without device for improved functional mobility and reduced fall risk.    Baseline  9/17: 16.72 secs without AD/AFO donned. Improved.     Time  4    Period  Weeks    Status  Not Met      PT SHORT TERM GOAL #3   Title  Pt will amb. 250' with RW with CGA on flat, even surface for incr. community accessibility.      Baseline  9/17: Pt amb. 250' with RW, Supervision on even surfaces.     Time  4    Period  Weeks    Status  Achieved      PT SHORT TERM GOAL #4   Title  Increase gait velocity from 1.53 ft/sec to >/= 1.9 ft/sec without device for incr. gait efficiency.    Baseline  9/17: 17.44secs = 1.40f./sec with no AD/ AFO donned; Improved.    Time  4    Period  Weeks    Status  Not Met      PT SHORT TERM GOAL #5   Title  Independent in HEP for RLE strengthening and balance exercises.    Baseline  9/20: Pt states she is independent with HEP.    Time  4    Period  Weeks    Status  Achieved        PT Long Term Goals - 06/26/18 0910      PT LONG TERM GOAL #1   Title  Pt will increase Berg score from 26/56 to >/= 36/56 to reduce fall risk.    Baseline  05/17/18: 47/56    Status  Achieved      PT LONG TERM GOAL #2   Title  AFO consult for RLE will be obtained - and AFO for RLE obtained if determined to be needed.     Baseline  05/17/18: consult is set for 05/24/18    Status  Achieved      PT LONG TERM GOAL #3   Title  Pt will amb. 500' with RW with supervision on flat, even surface for incr. community accessibility.    Status  Achieved      PT LONG TERM GOAL #4   Title  Improve TUG score from 18.09 secs to </= 13.5 secs without device to reduce fall risk.  Baseline  05/17/18: 13.35 sec's with no AD/brace used; 10.72 secs - 06-26-18 (no device used)    Status  Achieved      PT LONG TERM GOAL #5   Title  Increase gait velocity from 1.53 ft/sec to >/= 2.2 ft/sec without device for incr. gait efficiency.     Baseline  05/17/18: 2.37 ft/sec no AD/brace used; 10.71 sec =  3.06 ft/sec    Status  Achieved      PT LONG TERM GOAL #6   Title  Pt will be modified independent with household ambulation with use of Rt AFO.    Status  Achieved      PT LONG TERM GOAL #7   Title  Pt will increase Berg balance test score from 47/56 to >/= 50/56 to reduce fall risk.    Baseline  47/56 on 05-17-18; score 50/56 on 06-26-18    Status  Achieved      PT LONG TERM GOAL #8   Title  Negotiate 12 steps with 1 hand rail using a step over step sequence with ascension and step by step sequence with descension.     Status  Achieved      PT LONG TERM GOAL  #9   TITLE  Patient to demonstate appropraite posturing with ability to self-correcct 50% of the time for reduced stress/strain on neck and upper back musculature      PT LONG TERM GOAL  #10   TITLE  Patient to improve cervical AROM to [facility] without pain provocation      PT LONG TERM GOAL  #11   TITLE  Patient to report reduction in headache frequency and general neck pain by >/= 50%            Plan - 07/11/18 0934    Clinical Impression Statement  Today's skilled session focused on decreased cervical tightness and limited range of motion. Pt did report decreased tightess after ultrasound to the area. The patient did have sensitivity to touch/palpation of Traps/Rhomboid area of left side>right side. The pt did report decreased pain/tightness after session despite having increased pain with manual therapy. The pt is progressing toward goals and should benefit from continued PT to progress toward unmet goals.     Rehab Potential  Good    Clinical Impairments Affecting Rehab Potential  chronicity of deficits - approx. 3 yrs since onset (due to transverse myelitis?)    PT Frequency  2x / week    PT Duration  8 weeks    PT Treatment/Interventions  ADLs/Self Care Home Management;DME Instruction;Gait training;Stair training;Orthotic Fit/Training;Patient/family education;Neuromuscular re-education;Balance training;Therapeutic  exercise;Therapeutic activities;Passive range of motion;Moist Heat;Traction;Dry needling;Taping;Manual techniques;Spinal Manipulations;Joint Manipulations    PT Next Visit Plan  schedule for dry needling? E-stim/ultrasound for Lt upper trap trigger --continue PT for cervical pain      PT Home Exercise Plan  [id number] - cervical    Consulted and Agree with Plan of Care  Patient    Family Member Consulted  spouse - [name]       Patient will benefit from skilled therapeutic intervention in order to improve the following deficits and impairments:  Abnormal gait, Decreased activity tolerance, Decreased balance, Decreased coordination, Decreased strength, Impaired flexibility, Impaired tone, Impaired UE functional use, Pain, Postural dysfunction, Improper body mechanics, Decreased range of motion, Increased fascial restricitons  Visit Diagnosis: Muscle weakness (generalized)  Abnormal posture  Cervicalgia     Problem List Patient Active Problem List   Diagnosis Date Noted  . CAD (coronary  artery disease) 09/04/2013  . HTN (hypertension) 09/04/2013  . HLD (hyperlipidemia) 09/04/2013  . Tobacco abuse 09/04/2013    Willow Ora, PTA, [place] [address], [place], [place] [address] [phone] 07/12/18, 12:36 AM   Name: Kim Sutton MRN: [id number] Date of Birth: 10-01-1933

## 2018-07-13 ENCOUNTER — Ambulatory Visit: Payer: Medicare Other | Admitting: Physical Therapy

## 2018-07-16 ENCOUNTER — Encounter: Payer: Self-pay | Admitting: Occupational Therapy

## 2018-07-16 ENCOUNTER — Ambulatory Visit: Payer: Medicare Other | Admitting: Occupational Therapy

## 2018-07-16 DIAGNOSIS — R2689 Other abnormalities of gait and mobility: Secondary | ICD-10-CM | POA: Diagnosis not present

## 2018-07-16 DIAGNOSIS — M542 Cervicalgia: Secondary | ICD-10-CM | POA: Diagnosis not present

## 2018-07-16 DIAGNOSIS — R2681 Unsteadiness on feet: Secondary | ICD-10-CM

## 2018-07-16 DIAGNOSIS — M6281 Muscle weakness (generalized): Secondary | ICD-10-CM | POA: Diagnosis not present

## 2018-07-16 DIAGNOSIS — R293 Abnormal posture: Secondary | ICD-10-CM

## 2018-07-16 DIAGNOSIS — R278 Other lack of coordination: Secondary | ICD-10-CM | POA: Diagnosis not present

## 2018-07-16 NOTE — Therapy (Addendum)
[facility] [address] [place], [place], [address] Phone: [phone]   Fax:  [phone]  Occupational Therapy Treatment  Patient Details  Name: Kim Sutton MRN: [id number] Date of Birth: 1933-10-10 No data recorded  Encounter Date: 07/16/2018  OT End of Session - 07/16/18 0806    Visit Number  20    Number of Visits  24    Date for OT Re-Evaluation  07/25/18    Authorization Type  cert. period  06/26/18-09/24/18    Authorization Time Period  Medicare / [place] - Visit Number  20   Authorization - Number of Visits  30    OT Start Time  [phone]    OT Stop Time  [phone]    OT Time Calculation (min)  40 min    Activity Tolerance  Patient tolerated treatment well    Behavior During Therapy  [facility] for tasks assessed/performed       Past Medical History:  Diagnosis Date  . Adrenal cortical adenoma of left adrenal gland    stable since 2009;  MRI in 04/2010 - no further scans  . CAD (coronary artery disease)    a. s/p NSTEMI in 2001 tx with BMS to RCA  . Colon polyps   . HLD (hyperlipidemia)   . Hx of cardiovascular stress test    Lexiscan Myoview (09/2013):  No ischemia, EF 77%, Low Risk.  Marland Kitchen Hyperglycemia   . Hypertension   . Myoclonus    on neurontin per neuro at [facility]  . Nephrolithiasis   . Nodule of right lung    a. 28mm on CXR 10/2009;  CT 05/2010 ok - no further scans  . Tobacco abuse     Past Surgical History:  Procedure Laterality Date  . CHOLECYSTECTOMY    . CORONARY ANGIOPLASTY WITH STENT PLACEMENT    . TOTAL ABDOMINAL HYSTERECTOMY      There were no vitals filed for this visit.  Subjective Assessment - 07/16/18 0805    Subjective   didn't sleep well last night, but reports that she can do a lot of things that she couldn't before   Pertinent History  PMH:  transverse myelitis, hx of shingles per husband; CAD; HTN, HLD, hard of hearing    Limitations  fall risk, hard of hearing     Patient Stated Goals  improve ability to pick up things, use RUE more for cleaning, strengthening    Currently in Pain?  No/denies        Neuro re-ed:  In supine, BUEs closed-chain/AAROM shoulder flex,chest press, diagonals/R abduction, with ball with intermittent min facilitation/cues for normal movement patterns.   In modified quadruped, forward/backward wt. Shifts with min cueing for alignment/head position.  In sitting, wt bearing through R hand on mat with body on arm movements for incr tricep ext and scapular stability.  In prone, scapular retraction with shoulder ext. With min cues, then wt. Bearing through elbows with chest/head lift for scapular depression with min cues.  In standing, scapular retraction/shoulder ext AAROM with cane   Therapeutic Activities:  In standing, Mid-range functional reaching to place medium pegs in vertical pegboard with min cueing for normal movement patterns for incr coordination and activity tolerance RUE.     Picking up blocks with gripper set on level 1 (black spring) for sustained grip strength with mod difficulty and low range functional reach.  Completing Purdue Pegboard with mod difficulty for incr coordination and mod cueing for  trunk compensation.      OT Short Term Goals - 06/21/18 0812      OT SHORT TERM GOAL #1   Title  Pt will be independent with coordination, RUE strength HEP.--check STGs 05/25/18    Time  4    Period  Weeks    Status  Achieved      OT SHORT TERM GOAL #2   Title  Pt will improve RUE functional use for eating as shown by improving PPT#2 by at least 5sec.    Baseline  23.34sec    Time  4    Period  Weeks    Status  Achieved   16.0 secs     OT SHORT TERM GOAL #3   Title  Pt will improve coordination for ADLs as shown by improving time on 9-hole peg test by at least 10sec.    Baseline  59.38sec    Time  4    Period  Weeks    Status  Achieved   05/22/2018  37.69     OT SHORT TERM GOAL #4   Title  Pt  will improve R grip strength for opening containers/lifting objects by at least 5lbs.    Baseline  12lbs    Time  4    Period  Weeks    Status  Achieved   05/29/18:  22lbs     OT SHORT TERM GOAL #5   Title  Pt will be able to demo at least 85* R shoulder flex for functional reaching.    Baseline  75*    Time  8    Period  Weeks    Status  Achieved   05/19/18 80*, 06/21/18:  90*       OT Long Term Goals - 06/25/18 0815      OT LONG TERM GOAL #1   Title  Pt will be able to cut food mod I using AE prn.    Time  8    Period  Weeks    Status  Achieved   06/07/18     OT LONG TERM GOAL #2   Title  Pt will be able to use RUE to eat with at least 50% of meal.    Time  8    Period  Weeks    Status  Achieved   06/07/18:  75% per pt/husband      OT LONG TERM GOAL #3   Title  Pt will improve RUE functional reaching/coordination for ADLs as shown by improving score on box and blocks test by at least 8.    Baseline  22    Time  8    Period  Weeks    Status  On-going   06/25/18:  28 blocks     OT LONG TERM GOAL #4   Title  Pt will improve R grip strength for opening containers/lifting objects by at least 10lbs.    Baseline  12lbs    Period  Weeks    Status  On-going   06/25/18:  16lbs     OT LONG TERM GOAL #5   Title  Pt will be able to use RUE for cleaning/home maintenance tasks for at least 56min prior to rest break.    Time  8    Period  Weeks    Status  Achieved   06/25/18     OT LONG TERM GOAL #6   Title  Pt will be able to demo at least 95* R shoulder flex  for functional reaching.    Baseline  75*    Time  8    Period  Weeks    Status  On-going   06/21/18:  90*           Plan - 07/16/18 0808    Clinical Impression Statement Progress note reporting period:  06/07/18-07/16/18.   Pt is progressing towards goals.  She is demonstrating improving activity tolerance, coordination, strength, and RUE functional use.   Pt would benefit from additional occupational  therapy to continue to progress RUE functional use and activity tolerance.   Occupational Profile and client history currently impacting functional performance  Pt is now limited in IADL performance, particularly when using dominant RUE.  Pt has to switch to nondominant LUE during ADLs/IADLs.      Occupational performance deficits (Please refer to evaluation for details):  ADL's;IADL's;Leisure;Social Participation    Rehab Potential  Good    OT Frequency  2x / week    OT Duration  8 weeks    OT Treatment/Interventions  Self-care/ADL training;Therapeutic exercise;Patient/family education;Neuromuscular education;Moist Heat;Fluidtherapy;Energy conservation;Therapist, nutritional;Therapeutic activities;Balance training;Passive range of motion;Manual Therapy;DME and/or AE instruction;Cryotherapy    Plan  neuro re-ed, RUE functional use, scapular stability     Consulted and Agree with Plan of Care  Patient;Family member/caregiver    Family Member Consulted  husband       Patient will benefit from skilled therapeutic intervention in order to improve the following deficits and impairments:  Decreased knowledge of use of DME, Decreased coordination, Decreased mobility, Decreased strength, Decreased range of motion, Decreased endurance, Decreased activity tolerance, Decreased balance, Impaired UE functional use  Visit Diagnosis: Muscle weakness (generalized)  Abnormal posture  Other lack of coordination  Unsteadiness on feet    Problem List Patient Active Problem List   Diagnosis Date Noted  . CAD (coronary artery disease) 09/04/2013  . HTN (hypertension) 09/04/2013  . HLD (hyperlipidemia) 09/04/2013  . Tobacco abuse 09/04/2013    [facility] 07/16/2018, 8:11 AM  [facility] [address] [place], [place], [address] Phone: [phone]   Fax:  [phone]  Name: Kim Sutton MRN: [id number] Date of Birth:  1934/04/14   Vianne Bulls, OTR/L [facility] [address] [place], [place]  [address] [phone] phone [phone] 07/16/18 8:11 AM

## 2018-07-17 ENCOUNTER — Ambulatory Visit: Payer: Medicare Other | Admitting: Physical Therapy

## 2018-07-18 ENCOUNTER — Encounter: Payer: Self-pay | Admitting: Physical Therapy

## 2018-07-18 ENCOUNTER — Ambulatory Visit: Payer: Medicare Other | Admitting: Physical Therapy

## 2018-07-18 DIAGNOSIS — M6281 Muscle weakness (generalized): Secondary | ICD-10-CM

## 2018-07-18 DIAGNOSIS — R2681 Unsteadiness on feet: Secondary | ICD-10-CM | POA: Diagnosis not present

## 2018-07-18 DIAGNOSIS — M542 Cervicalgia: Secondary | ICD-10-CM

## 2018-07-18 DIAGNOSIS — R293 Abnormal posture: Secondary | ICD-10-CM

## 2018-07-18 DIAGNOSIS — R278 Other lack of coordination: Secondary | ICD-10-CM | POA: Diagnosis not present

## 2018-07-18 DIAGNOSIS — R2689 Other abnormalities of gait and mobility: Secondary | ICD-10-CM | POA: Diagnosis not present

## 2018-07-18 NOTE — Therapy (Signed)
[facility] [address] [place], [place], [address] Phone: [phone]   Fax:  [phone]  Physical Therapy Treatment  Patient Details  Name: Kim Sutton MRN: [id number] Date of Birth: 12/25/1933 Referring Provider (PT): Dr. Lajean Manes   Encounter Date: 07/18/2018  PT End of Session - 07/18/18 1248    Visit Number  29    Number of Visits  32    Date for PT Re-Evaluation  07/20/18    Authorization Type  Medicare and [employer]    Authorization Time Period  03-22-18 - 06-20-18; 05-22-18 - 07-31-18    PT Start Time  0841    PT Stop Time  0927    PT Time Calculation (min)  46 min    Activity Tolerance  Patient tolerated treatment well    Behavior During Therapy  [facility] for tasks assessed/performed       Past Medical History:  Diagnosis Date  . Adrenal cortical adenoma of left adrenal gland    stable since 2009;  MRI in 04/2010 - no further scans  . CAD (coronary artery disease)    a. s/p NSTEMI in 2001 tx with BMS to RCA  . Colon polyps   . HLD (hyperlipidemia)   . Hx of cardiovascular stress test    Lexiscan Myoview (09/2013):  No ischemia, EF 77%, Low Risk.  Marland Kitchen Hyperglycemia   . Hypertension   . Myoclonus    on neurontin per neuro at [facility]  . Nephrolithiasis   . Nodule of right lung    a. 69m on CXR 10/2009;  CT 05/2010 ok - no further scans  . Tobacco abuse     Past Surgical History:  Procedure Laterality Date  . CHOLECYSTECTOMY    . CORONARY ANGIOPLASTY WITH STENT PLACEMENT    . TOTAL ABDOMINAL HYSTERECTOMY      There were no vitals filed for this visit.  Subjective Assessment - 07/18/18 0844    Subjective  doing well - had good relief following last session - great reduction in headache symptoms    Patient is accompained by:  Family member    Pertinent History  h/o transverse myelitis;  (husband reports Rt sided weakness is due to pt having had lesion on spine due to shingles approx. 3 yrs ago):  CAD;  HTN    Patient Stated Goals  "get up and walk some place" "reduce neck pain"    Currently in Pain?  No/denies                       [facility] - 07/[facility]/18 0001      Exercises   Exercises  Neck      Neck Exercises: Seated   Neck Retraction  10 reps;5 secs    Neck Retraction Limitations  in mirror; heavy verbal/tactile cueing wiht poor form    Money  15 reps;5 secs    Money Limitations  + scap squeeze - in mirror for feedback    Other Seated Exercise  scap retraction 15 x 5 sec hold with cueing to reduce UT involvement      Manual Therapy   Manual Therapy  Soft tissue mobilization;Myofascial release;Passive ROM    Manual therapy comments  patient hooklying with bolster    Soft tissue mobilization  STM to B UT, B cervical paraspinals, B suboccipital mm    Myofascial Release  manual trigger point release to B UT and cervical paraspinals  Passive ROM  PROM in B UT stretch 2 x 30 sec each side       Neck Exercises: Stretches   Upper Trapezius Stretch  Right;Left;2 reps;30 seconds    Upper Trapezius Stretch Limitations  in sitting - no overpressure               PT Short Term Goals - 04/24/18 0935      PT SHORT TERM GOAL #1   Title  Improve Berg score from 26/56 to >/= 31/56 to reduce fall risk.    Baseline  9/20: 43/56     Time  4    Period  Weeks    Status  Achieved      PT SHORT TERM GOAL #2   Title  Improve TUG score from 18.09 secs without device to </= 15 secs without device for improved functional mobility and reduced fall risk.    Baseline  9/17: 16.72 secs without AD/AFO donned. Improved.     Time  4    Period  Weeks    Status  Not Met      PT SHORT TERM GOAL #3   Title  Pt will amb. 250' with RW with CGA on flat, even surface for incr. community accessibility.      Baseline  9/17: Pt amb. 250' with RW, Supervision on even surfaces.     Time  4    Period  Weeks    Status  Achieved      PT SHORT TERM GOAL #4    Title  Increase gait velocity from 1.53 ft/sec to >/= 1.9 ft/sec without device for incr. gait efficiency.    Baseline  9/17: 17.44secs = 1.49f./sec with no AD/ AFO donned; Improved.    Time  4    Period  Weeks    Status  Not Met      PT SHORT TERM GOAL #5   Title  Independent in HEP for RLE strengthening and balance exercises.    Baseline  9/20: Pt states she is independent with HEP.    Time  4    Period  Weeks    Status  Achieved        PT Long Term Goals - 06/26/18 0910      PT LONG TERM GOAL #1   Title  Pt will increase Berg score from 26/56 to >/= 36/56 to reduce fall risk.    Baseline  05/17/18: 47/56    Status  Achieved      PT LONG TERM GOAL #2   Title  AFO consult for RLE will be obtained - and AFO for RLE obtained if determined to be needed.     Baseline  05/17/18: consult is set for 05/24/18    Status  Achieved      PT LONG TERM GOAL #3   Title  Pt will amb. 500' with RW with supervision on flat, even surface for incr. community accessibility.    Status  Achieved      PT LONG TERM GOAL #4   Title  Improve TUG score from 18.09 secs to </= 13.5 secs without device to reduce fall risk.    Baseline  05/17/18: 13.35 sec's with no AD/brace used; 10.72 secs - 06-26-18 (no device used)    Status  Achieved      PT LONG TERM GOAL #5   Title  Increase gait velocity from 1.53 ft/sec to >/= 2.2 ft/sec without device for incr. gait efficiency.  Baseline  05/17/18: 2.37 ft/sec no AD/brace used; 10.71 sec = 3.06 ft/sec    Status  Achieved      PT LONG TERM GOAL #6   Title  Pt will be modified independent with household ambulation with use of Rt AFO.    Status  Achieved      PT LONG TERM GOAL #7   Title  Pt will increase Berg balance test score from 47/56 to >/= 50/56 to reduce fall risk.    Baseline  47/56 on 05-17-18; score 50/56 on 06-26-18    Status  Achieved      PT LONG TERM GOAL #8   Title  Negotiate 12 steps with 1 hand rail using a step over step sequence  with ascension and step by step sequence with descension.     Status  Achieved      PT LONG TERM GOAL  #9   TITLE  Patient to demonstate appropraite posturing with ability to self-correcct 50% of the time for reduced stress/strain on neck and upper back musculature      PT LONG TERM GOAL  #10   TITLE  Patient to improve cervical AROM to [facility] without pain provocation      PT LONG TERM GOAL  #11   TITLE  Patient to report reduction in headache frequency and general neck pain by >/= 50%            Plan - 07/18/18 1248    Clinical Impression Statement  Attempted TDN to L UT today per patient request - education provided prior to treatment regarding risk, justification as well as expectations with treatment - patient with poor tolerance to initial needle stick, therefore further treatment deferred. Session with large manual focus for hopeful reduced tissue tension and overall pain pattens. Does demonstrate increaed tension/palpable trigger points with patient tender to deep palpation. Ecucation on need to correct/improve upon posturing and general flexibility at neck/upper back as this is likely primary source of pain.     Rehab Potential  Good    Clinical Impairments Affecting Rehab Potential  chronicity of deficits - approx. 3 yrs since onset (due to transverse myelitis?)    PT Frequency  2x / week    PT Duration  8 weeks    PT Treatment/Interventions  ADLs/Self Care Home Management;DME Instruction;Gait training;Stair training;Orthotic Fit/Training;Patient/family education;Neuromuscular re-education;Balance training;Therapeutic exercise;Therapeutic activities;Passive range of motion;Moist Heat;Traction;Dry needling;Taping;Manual techniques;Spinal Manipulations;Joint Manipulations    PT Next Visit Plan  E-stim/ultrasound for Lt upper trap trigger --continue PT for cervical pain      PT Home Exercise Plan  [id number] - cervical    Consulted and Agree with Plan of Care  Patient    Family Member  Consulted  spouse - [name]       Patient will benefit from skilled therapeutic intervention in order to improve the following deficits and impairments:  Abnormal gait, Decreased activity tolerance, Decreased balance, Decreased coordination, Decreased strength, Impaired flexibility, Impaired tone, Impaired UE functional use, Pain, Postural dysfunction, Improper body mechanics, Decreased range of motion, Increased fascial restricitons  Visit Diagnosis: Muscle weakness (generalized)  Abnormal posture  Cervicalgia     Problem List Patient Active Problem List   Diagnosis Date Noted  . CAD (coronary artery disease) 09/04/2013  . HTN (hypertension) 09/04/2013  . HLD (hyperlipidemia) 09/04/2013  . Tobacco abuse 09/04/2013     Lanney Gins, PT, DPT Supplemental Physical Therapist 07/18/18 12:56 PM Pager: [phone] Office: [facility] [place] [phone]  [place], [place], [address] Phone: [phone]   Fax:  [phone]  Name: MARIELL NESTER MRN: [id number] Date of Birth: 1934-06-06

## 2018-07-18 NOTE — Patient Instructions (Signed)
Axial Extension (Chin Tuck)    Pull chin in and lengthen back of neck. Hold __5-10__ seconds while counting out loud. Repeat __10-15__ times. Do __3__ sessions per day.  Resisted External Rotation: in Neutral - Bilateral    Sit or stand, tubing in both hands, elbows at sides, bent to 90, forearms forward. Pinch shoulder blades together and rotate forearms out. Keep elbows at sides. Repeat _15___ times per set. Do __2__ sets per session.

## 2018-07-19 ENCOUNTER — Ambulatory Visit: Payer: Medicare Other | Admitting: Occupational Therapy

## 2018-07-19 ENCOUNTER — Encounter: Payer: Self-pay | Admitting: Occupational Therapy

## 2018-07-19 DIAGNOSIS — R278 Other lack of coordination: Secondary | ICD-10-CM | POA: Diagnosis not present

## 2018-07-19 DIAGNOSIS — R2681 Unsteadiness on feet: Secondary | ICD-10-CM

## 2018-07-19 DIAGNOSIS — M6281 Muscle weakness (generalized): Secondary | ICD-10-CM

## 2018-07-19 DIAGNOSIS — R2689 Other abnormalities of gait and mobility: Secondary | ICD-10-CM | POA: Diagnosis not present

## 2018-07-19 DIAGNOSIS — R293 Abnormal posture: Secondary | ICD-10-CM

## 2018-07-19 DIAGNOSIS — M542 Cervicalgia: Secondary | ICD-10-CM | POA: Diagnosis not present

## 2018-07-19 NOTE — Therapy (Signed)
[facility] [address] [place], [place], [address] Phone: [phone]   Fax:  [phone]  Occupational Therapy Treatment  Patient Details  Name: Kim Sutton MRN: [id number] Date of Birth: 02-May-1934 No data recorded  Encounter Date: 07/19/2018  OT End of Session - 07/19/18 0815    Visit Number  21    Number of Visits  24    Date for OT Re-Evaluation  07/25/18    Authorization Type  cert. period  06/26/18-09/24/18    Authorization Time Period  Medicare / [place]    Authorization - Visit Number  6    Authorization - Number of Visits  10    OT Start Time  [phone]    OT Stop Time  0930    OT Time Calculation (min)  41 min    Activity Tolerance  Patient tolerated treatment well    Behavior During Therapy  [facility] for tasks assessed/performed       Past Medical History:  Diagnosis Date  . Adrenal cortical adenoma of left adrenal gland    stable since 2009;  MRI in 04/2010 - no further scans  . CAD (coronary artery disease)    a. s/p NSTEMI in 2001 tx with BMS to RCA  . Colon polyps   . HLD (hyperlipidemia)   . Hx of cardiovascular stress test    Lexiscan Myoview (09/2013):  No ischemia, EF 77%, Low Risk.  Marland Kitchen Hyperglycemia   . Hypertension   . Myoclonus    on neurontin per neuro at [facility]  . Nephrolithiasis   . Nodule of right lung    a. 22mm on CXR 10/2009;  CT 05/2010 ok - no further scans  . Tobacco abuse     Past Surgical History:  Procedure Laterality Date  . CHOLECYSTECTOMY    . CORONARY ANGIOPLASTY WITH STENT PLACEMENT    . TOTAL ABDOMINAL HYSTERECTOMY      There were no vitals filed for this visit.  Subjective Assessment - 07/19/18 0756    Subjective   This is tough    Pertinent History  PMH:  transverse myelitis, hx of shingles per husband; CAD; HTN, HLD, hard of hearing    Limitations  fall risk, hard of hearing    Patient Stated Goals  improve ability to pick up things, use RUE more for cleaning,  strengthening    Currently in Pain?  No/denies       Neuro re-ed:  In supine, BUEs closed-chain/AAROM shoulder flex,diagonals/R abduction, with ball with intermittent min cues for normal movement patterns.  Then open chain shoulder, chest press with 1lb weight x10 with min facilitation for normal movement patterns.  In modified quadruped, forward/backward wt. Shifts with min cueing for alignment/head position.  In sitting, wt bearing through R hand on mat with body on arm movements for incr tricep ext and scapular stability.  In prone, scapular retraction with shoulder ext. With min cues, then wt. Bearing through elbows with chest/head lift for scapular depression with  Head turns and min cues.   Therapeutic Activities:  In sitting/standing, Mid-range functional reaching to place/remove clothespins on vertical pole with min cueing for normal movement patterns for incr coordination and activity tolerance RUE.     Placing small pegs in pegboard for incr coordination with min difficulty.  Flipping cards with functional reach and trunk rotation with min cues/focus on ER, supination, head turns with trunk rotation.         OT Short Term Goals -  06/21/18 0812      OT SHORT TERM GOAL #1   Title  Pt will be independent with coordination, RUE strength HEP.--check STGs 05/25/18    Time  4    Period  Weeks    Status  Achieved      OT SHORT TERM GOAL #2   Title  Pt will improve RUE functional use for eating as shown by improving PPT#2 by at least 5sec.    Baseline  23.34sec    Time  4    Period  Weeks    Status  Achieved   16.0 secs     OT SHORT TERM GOAL #3   Title  Pt will improve coordination for ADLs as shown by improving time on 9-hole peg test by at least 10sec.    Baseline  59.38sec    Time  4    Period  Weeks    Status  Achieved   05/22/2018  37.69     OT SHORT TERM GOAL #4   Title  Pt will improve R grip strength for opening containers/lifting objects by at least  5lbs.    Baseline  12lbs    Time  4    Period  Weeks    Status  Achieved   05/29/18:  22lbs     OT SHORT TERM GOAL #5   Title  Pt will be able to demo at least 85* R shoulder flex for functional reaching.    Baseline  75*    Time  8    Period  Weeks    Status  Achieved   05/19/18 80*, 06/21/18:  90*       OT Long Term Goals - 06/25/18 0815      OT LONG TERM GOAL #1   Title  Pt will be able to cut food mod I using AE prn.    Time  8    Period  Weeks    Status  Achieved   06/07/18     OT LONG TERM GOAL #2   Title  Pt will be able to use RUE to eat with at least 50% of meal.    Time  8    Period  Weeks    Status  Achieved   06/07/18:  75% per pt/husband      OT LONG TERM GOAL #3   Title  Pt will improve RUE functional reaching/coordination for ADLs as shown by improving score on box and blocks test by at least 8.    Baseline  22    Time  8    Period  Weeks    Status  On-going   06/25/18:  28 blocks     OT LONG TERM GOAL #4   Title  Pt will improve R grip strength for opening containers/lifting objects by at least 10lbs.    Baseline  12lbs    Period  Weeks    Status  On-going   06/25/18:  16lbs     OT LONG TERM GOAL #5   Title  Pt will be able to use RUE for cleaning/home maintenance tasks for at least 98min prior to rest break.    Time  8    Period  Weeks    Status  Achieved   06/25/18     OT LONG TERM GOAL #6   Title  Pt will be able to demo at least 95* R shoulder flex for functional reaching.    Baseline  75*  Time  8    Period  Weeks    Status  On-going   06/21/18:  90*           Plan - 07/19/18 0815    Clinical Impression Statement  Pt continues to make progress towards goals and demo improving activity tolerance, but continues to need cueing for head/trunk alignment.    Occupational Profile and client history currently impacting functional performance  Pt is now limited in IADL performance, particularly when using dominant RUE.  Pt has to  switch to nondominant LUE during ADLs/IADLs.      Occupational performance deficits (Please refer to evaluation for details):  ADL's;IADL's;Leisure;Social Participation    Rehab Potential  Good    OT Frequency  2x / week    OT Duration  8 weeks    OT Treatment/Interventions  Self-care/ADL training;Therapeutic exercise;Patient/family education;Neuromuscular education;Moist Heat;Fluidtherapy;Energy conservation;Therapist, nutritional;Therapeutic activities;Balance training;Passive range of motion;Manual Therapy;DME and/or AE instruction;Cryotherapy    Plan  check remaining goals and anticipate d/c next week    Consulted and Agree with Plan of Care  Patient;Family member/caregiver    Family Member Consulted  husband       Patient will benefit from skilled therapeutic intervention in order to improve the following deficits and impairments:  Decreased knowledge of use of DME, Decreased coordination, Decreased mobility, Decreased strength, Decreased range of motion, Decreased endurance, Decreased activity tolerance, Decreased balance, Impaired UE functional use  Visit Diagnosis: Muscle weakness (generalized)  Abnormal posture  Other lack of coordination  Unsteadiness on feet    Problem List Patient Active Problem List   Diagnosis Date Noted  . CAD (coronary artery disease) 09/04/2013  . HTN (hypertension) 09/04/2013  . HLD (hyperlipidemia) 09/04/2013  . Tobacco abuse 09/04/2013    [facility] 07/19/2018, 12:41 PM  [facility] [address] [place], [place], [address] Phone: [phone]   Fax:  [phone]  Name: Kim Sutton MRN: [id number] Date of Birth: [birth year]   Vianne Bulls, OTR/L [facility] [address] [place], [place]  [address] [phone] phone [phone] 07/19/18 12:41 PM

## 2018-07-20 ENCOUNTER — Encounter: Payer: Self-pay | Admitting: Physical Therapy

## 2018-07-20 ENCOUNTER — Ambulatory Visit: Payer: Medicare Other | Admitting: Physical Therapy

## 2018-07-20 DIAGNOSIS — R278 Other lack of coordination: Secondary | ICD-10-CM | POA: Diagnosis not present

## 2018-07-20 DIAGNOSIS — R293 Abnormal posture: Secondary | ICD-10-CM

## 2018-07-20 DIAGNOSIS — M6281 Muscle weakness (generalized): Secondary | ICD-10-CM | POA: Diagnosis not present

## 2018-07-20 DIAGNOSIS — R2681 Unsteadiness on feet: Secondary | ICD-10-CM | POA: Diagnosis not present

## 2018-07-20 DIAGNOSIS — M542 Cervicalgia: Secondary | ICD-10-CM

## 2018-07-20 DIAGNOSIS — R2689 Other abnormalities of gait and mobility: Secondary | ICD-10-CM | POA: Diagnosis not present

## 2018-07-20 NOTE — Patient Instructions (Signed)
Scapular Retraction: Rowing (Eccentric) - Arms - 45 Degrees (Resistance Band)    Hold end of band in each hand. Pull back until elbows are even with trunk.  Thumbs up. Slowly release for 3-5 seconds. Use ___red_____ resistance band. __15_ reps per set, _2__ sets per day.   Strengthening: Resisted Extension    Hold tubing in both hand, arm forward. Pull arm back, elbow straight. Repeat __15__ times per set. Do __2__ sets per session.

## 2018-07-20 NOTE — Therapy (Addendum)
[facility] [address] [place], [place], [address] Phone: [phone]   Fax:  [phone]  Physical Therapy Treatment  Patient Details  Name: Kim Sutton MRN: [id number] Date of Birth: 1933-11-17 Referring Provider (PT): Dr. Lajean Manes  Progress Note Reporting Period 06/01/18 to 07/20/18  See note below for Objective Data and Assessment of Progress/Goals.    Encounter Date: 07/20/2018  PT End of Session - 07/20/18 0803    Visit Number  30    Number of Visits  32    Date for PT Re-Evaluation  07/20/18    Authorization Type  Medicare and [employer]    Authorization Time Period  03-22-18 - 06-20-18; 05-22-18 - 07-31-18    PT Start Time  0800    PT Stop Time  0844    PT Time Calculation (min)  44 min    Activity Tolerance  Patient tolerated treatment well    Behavior During Therapy  [facility] for tasks assessed/performed       Past Medical History:  Diagnosis Date  . Adrenal cortical adenoma of left adrenal gland    stable since 2009;  MRI in 04/2010 - no further scans  . CAD (coronary artery disease)    a. s/p NSTEMI in 2001 tx with BMS to RCA  . Colon polyps   . HLD (hyperlipidemia)   . Hx of cardiovascular stress test    Lexiscan Myoview (09/2013):  No ischemia, EF 77%, Low Risk.  Marland Kitchen Hyperglycemia   . Hypertension   . Myoclonus    on neurontin per neuro at [facility]  . Nephrolithiasis   . Nodule of right lung    a. 97m on CXR 10/2009;  CT 05/2010 ok - no further scans  . Tobacco abuse     Past Surgical History:  Procedure Laterality Date  . CHOLECYSTECTOMY    . CORONARY ANGIOPLASTY WITH STENT PLACEMENT    . TOTAL ABDOMINAL HYSTERECTOMY      There were no vitals filed for this visit.  Subjective Assessment - 07/20/18 0802    Subjective  reports compliance with HEP - no headaches recently    Patient is accompained by:  Family member    Pertinent History  h/o transverse myelitis;  (husband reports Rt sided  weakness is due to pt having had lesion on spine due to shingles approx. 3 yrs ago):  CAD; HTN    Patient Stated Goals  "get up and walk some place" "reduce neck pain"    Currently in Pain?  No/denies    Pain Score  0-No pain                       [facility] Adult PT Treatment/Exercise - 07/20/18 0001      Exercises   Other Exercises   seated on blue physioball for core engagement: alternating marches x 10, alternating LAQ x 10, scap retraction x 10, resisted rows with red tband x 10 - all requiring Min A for psoturing with cueing for reduced UT involvement      Neck Exercises: Standing   Other Standing Exercises  rows - red tband x 15 reps - cueing for posturing    Other Standing Exercises  resisted shoulder extension + scap retraction - red tband x 15 reps - cueing for posture      Neck Exercises: Seated   Neck Retraction  10 reps;5 secs    Neck Retraction Limitations  continued cueing for form  Money  15 reps;5 secs    Money Limitations  + scap squeeze - added red resistance band      Manual Therapy   Manual Therapy  Soft tissue mobilization;Myofascial release    Manual therapy comments  patient seated    Soft tissue mobilization  STM to B thoracic paraspinals, B infra, B LS, B UT    Myofascial Release  manual trigger point release to B thoracic paraspinals, B LS, B infra               PT Short Term Goals - 04/24/18 0935      PT SHORT TERM GOAL #1   Title  Improve Berg score from 26/56 to >/= 31/56 to reduce fall risk.    Baseline  9/20: 43/56     Time  4    Period  Weeks    Status  Achieved      PT SHORT TERM GOAL #2   Title  Improve TUG score from 18.09 secs without device to </= 15 secs without device for improved functional mobility and reduced fall risk.    Baseline  9/17: 16.72 secs without AD/AFO donned. Improved.     Time  4    Period  Weeks    Status  Not Met      PT SHORT TERM GOAL #3   Title  Pt will amb. 250' with RW with CGA on flat,  even surface for incr. community accessibility.      Baseline  9/17: Pt amb. 250' with RW, Supervision on even surfaces.     Time  4    Period  Weeks    Status  Achieved      PT SHORT TERM GOAL #4   Title  Increase gait velocity from 1.53 ft/sec to >/= 1.9 ft/sec without device for incr. gait efficiency.    Baseline  9/17: 17.44secs = 1.74f./sec with no AD/ AFO donned; Improved.    Time  4    Period  Weeks    Status  Not Met      PT SHORT TERM GOAL #5   Title  Independent in HEP for RLE strengthening and balance exercises.    Baseline  9/20: Pt states she is independent with HEP.    Time  4    Period  Weeks    Status  Achieved        PT Long Term Goals - 06/26/18 0910      PT LONG TERM GOAL #1   Title  Pt will increase Berg score from 26/56 to >/= 36/56 to reduce fall risk.    Baseline  05/17/18: 47/56    Status  Achieved      PT LONG TERM GOAL #2   Title  AFO consult for RLE will be obtained - and AFO for RLE obtained if determined to be needed.     Baseline  05/17/18: consult is set for 05/24/18    Status  Achieved      PT LONG TERM GOAL #3   Title  Pt will amb. 500' with RW with supervision on flat, even surface for incr. community accessibility.    Status  Achieved      PT LONG TERM GOAL #4   Title  Improve TUG score from 18.09 secs to </= 13.5 secs without device to reduce fall risk.    Baseline  05/17/18: 13.35 sec's with no AD/brace used; 10.72 secs - 06-26-18 (no device used)    Status  Achieved      PT LONG TERM GOAL #5   Title  Increase gait velocity from 1.53 ft/sec to >/= 2.2 ft/sec without device for incr. gait efficiency.     Baseline  05/17/18: 2.37 ft/sec no AD/brace used; 10.71 sec = 3.06 ft/sec    Status  Achieved      PT LONG TERM GOAL #6   Title  Pt will be modified independent with household ambulation with use of Rt AFO.    Status  Achieved      PT LONG TERM GOAL #7   Title  Pt will increase Berg balance test score from 47/56 to >/= 50/56  to reduce fall risk.    Baseline  47/56 on 05-17-18; score 50/56 on 06-26-18    Status  Achieved      PT LONG TERM GOAL #8   Title  Negotiate 12 steps with 1 hand rail using a step over step sequence with ascension and step by step sequence with descension.     Status  Achieved      PT LONG TERM GOAL  #9   TITLE  Patient to demonstate appropraite posturing with ability to self-correcct 50% of the time for reduced stress/strain on neck and upper back musculature   Status: in progress: noted continued head tilt and rotation with inability to self correct without cueing or mirror placement.      PT LONG TERM GOAL  #10   TITLE  Patient to improve cervical AROM to [facility] without pain provocation   Status: in progress: improved cervical AROM with continued manual therapy and HEP for home stretching     PT LONG TERM GOAL  #11   TITLE  Patient to report reduction in headache frequency and general neck pain by >/= 50%   Status: Near met: reports overall reduction in daily pain, however still experiences pain with palpation and stretching demonstrating continued muscular tightness.            Plan - 07/20/18 0851    Clinical Impression Statement  Session today focusing on postural re-education as well as general periscapular strengthening and core stengthening to promote a more upright posture. Patient requiring consistent verbal/tactile cueing for full carryover. Does demonstrate R shoulder hiking with all UE movements as well as L head tilt - cueing to improve with limited carryover. Will continue to progess towards goals.     Rehab Potential  Good    Clinical Impairments Affecting Rehab Potential  chronicity of deficits - approx. 3 yrs since onset (due to transverse myelitis?)    PT Frequency  2x / week    PT Duration  8 weeks    PT Treatment/Interventions  ADLs/Self Care Home Management;DME Instruction;Gait training;Stair training;Orthotic Fit/Training;Patient/family  education;Neuromuscular re-education;Balance training;Therapeutic exercise;Therapeutic activities;Passive range of motion;Moist Heat;Traction;Dry needling;Taping;Manual techniques;Spinal Manipulations;Joint Manipulations    PT Next Visit Plan  E-stim/ultrasound for Lt upper trap trigger --continue PT for cervical pain      PT Home Exercise Plan  [id number] - cervical    Consulted and Agree with Plan of Care  Patient    Family Member Consulted  spouse - [name]       Patient will benefit from skilled therapeutic intervention in order to improve the following deficits and impairments:  Abnormal gait, Decreased activity tolerance, Decreased balance, Decreased coordination, Decreased strength, Impaired flexibility, Impaired tone, Impaired UE functional use, Pain, Postural dysfunction, Improper body mechanics, Decreased range of motion, Increased fascial restricitons  Visit Diagnosis: Muscle weakness (generalized)  Abnormal posture  Cervicalgia     Problem List Patient Active Problem List   Diagnosis Date Noted  . CAD (coronary artery disease) 09/04/2013  . HTN (hypertension) 09/04/2013  . HLD (hyperlipidemia) 09/04/2013  . Tobacco abuse 09/04/2013     Lanney Gins, PT, DPT Supplemental Physical Therapist 07/20/18 10:27 AM Pager: [phone] Office: [place] [facility] [address] [place], [place], [address] Phone: [phone]   Fax:  [phone]  Name: Kim Sutton MRN: [id number] Date of Birth: May 17, 1934   Patient making progress towards goals, however does continue to require skilled need for PT due to noted poor posturing, reduced ability to activate core musculature needed for posturing and overall stability. Patient requiring verbal and tactile cueing from PT throughout session to promote good mechanics in an effort to reduce overall pain experienced by patient that affects her daily life. Overall,  patient pleased with current functional progress, but demonstrates need for continued HEP use for stretching, posturing, body mechanics, and to promote reduced pain.   Lanney Gins, PT, DPT Supplemental Physical Therapist Pager: [phone] Office: [phone]

## 2018-07-23 ENCOUNTER — Ambulatory Visit: Payer: Medicare Other | Admitting: Occupational Therapy

## 2018-07-23 ENCOUNTER — Encounter: Payer: Self-pay | Admitting: Occupational Therapy

## 2018-07-23 DIAGNOSIS — M542 Cervicalgia: Secondary | ICD-10-CM | POA: Diagnosis not present

## 2018-07-23 DIAGNOSIS — R2681 Unsteadiness on feet: Secondary | ICD-10-CM

## 2018-07-23 DIAGNOSIS — R293 Abnormal posture: Secondary | ICD-10-CM | POA: Diagnosis not present

## 2018-07-23 DIAGNOSIS — M6281 Muscle weakness (generalized): Secondary | ICD-10-CM | POA: Diagnosis not present

## 2018-07-23 DIAGNOSIS — R278 Other lack of coordination: Secondary | ICD-10-CM

## 2018-07-23 DIAGNOSIS — R2689 Other abnormalities of gait and mobility: Secondary | ICD-10-CM

## 2018-07-23 NOTE — Therapy (Signed)
[facility] [address] [place], [place], [address] Phone: [phone]   Fax:  [phone]  Occupational Therapy Treatment  Patient Details  Name: Kim Sutton MRN: [id number] Date of Birth: 02-Feb-1934 No data recorded  Encounter Date: 07/23/2018  OT End of Session - 07/23/18 0828    Visit Number  22    Number of Visits  24    Date for OT Re-Evaluation  07/27/18    Authorization Type  cert. period  06/26/18-09/24/18    Authorization Time Period  Medicare / [place]    Authorization - Visit Number  7    Authorization - Number of Visits  10    OT Start Time  0830    OT Stop Time  0915    OT Time Calculation (min)  45 min    Activity Tolerance  Patient tolerated treatment well    Behavior During Therapy  WFL for tasks assessed/performed       Past Medical History:  Diagnosis Date  . Adrenal cortical adenoma of left adrenal gland    stable since 2009;  MRI in 04/2010 - no further scans  . CAD (coronary artery disease)    a. s/p NSTEMI in 2001 tx with BMS to RCA  . Colon polyps   . HLD (hyperlipidemia)   . Hx of cardiovascular stress test    Lexiscan Myoview (09/2013):  No ischemia, EF 77%, Low Risk.  Marland Kitchen Hyperglycemia   . Hypertension   . Myoclonus    on neurontin per neuro at [facility]  . Nephrolithiasis   . Nodule of right lung    a. 16m on CXR 10/2009;  CT 05/2010 ok - no further scans  . Tobacco abuse     Past Surgical History:  Procedure Laterality Date  . CHOLECYSTECTOMY    . CORONARY ANGIOPLASTY WITH STENT PLACEMENT    . TOTAL ABDOMINAL HYSTERECTOMY      There were no vitals filed for this visit.  Subjective Assessment - 07/23/18 0828    Subjective   Pt reports that she did all her Christmas shopping and wrapped all her presents the last 2 days.  Standing, legs are tired.      Pertinent History  PMH:  transverse myelitis, hx of shingles per husband; CAD; HTN, HLD, hard of hearing    Limitations  fall  risk, hard of hearing    Patient Stated Goals  improve ability to pick up things, use RUE more for cleaning, strengthening    Currently in Pain?  No/denies       Neuro re-ed:  In supine, BUEs closed-chain/AAROM shoulder flex,chest press, diagonals/R abduction, with ball with intermittent min cues/facilitation for normal movement patterns with fatigue today.    In modified quadruped, forward/backward wt. Shifts with min cueing for alignment/head position.  In prone, scapular retraction with shoulder ext. With min cues, then wt. Bearing through elbows with chest/head lift for scapular depression   Therapeutic Activities:  In sitting/standing, Mid-range functional reaching to place/remove clothespins on vertical pole with min cueing for normal movement patterns for incr coordination and activity tolerance RUE.     Placing grooved pegs in pegboard for incr coordination with min difficulty.     OT Education - 07/23/18 1101    Education Details  Yellow theraband HEP (low level horizontal abduction, shoulder ext, rows/scapular retraction)--reviewed, pt returned demo each x10    Person(s) Educated  Patient;Spouse    Methods  Explanation;Demonstration;Verbal cues    Comprehension  Verbalized understanding;Returned demonstration;Verbal cues required       OT Short Term Goals - 06/21/18 0812      OT SHORT TERM GOAL #1   Title  Pt will be independent with coordination, RUE strength HEP.--check STGs 05/25/18    Time  4    Period  Weeks    Status  Achieved      OT SHORT TERM GOAL #2   Title  Pt will improve RUE functional use for eating as shown by improving PPT#2 by at least 5sec.    Baseline  23.34sec    Time  4    Period  Weeks    Status  Achieved   16.0 secs     OT SHORT TERM GOAL #3   Title  Pt will improve coordination for ADLs as shown by improving time on 9-hole peg test by at least 10sec.    Baseline  59.38sec    Time  4    Period  Weeks    Status  Achieved    05/22/2018  37.69     OT SHORT TERM GOAL #4   Title  Pt will improve R grip strength for opening containers/lifting objects by at least 5lbs.    Baseline  12lbs    Time  4    Period  Weeks    Status  Achieved   05/29/18:  22lbs     OT SHORT TERM GOAL #5   Title  Pt will be able to demo at least 85* R shoulder flex for functional reaching.    Baseline  75*    Time  8    Period  Weeks    Status  Achieved   05/19/18 80*, 06/21/18:  90*       OT Long Term Goals - 07/23/18 0853      OT LONG TERM GOAL #1   Title  Pt will be able to cut food mod I using AE prn.    Time  8    Period  Weeks    Status  Achieved   06/07/18     OT LONG TERM GOAL #2   Title  Pt will be able to use RUE to eat with at least 50% of meal.    Time  8    Period  Weeks    Status  Achieved   06/07/18:  75% per pt/husband      OT LONG TERM GOAL #3   Title  Pt will improve RUE functional reaching/coordination for ADLs as shown by improving score on box and blocks test by at least 8.    Baseline  22    Time  8    Period  Weeks    Status  On-going   06/25/18:  28 blocks     OT LONG TERM GOAL #4   Title  Pt will improve R grip strength for opening containers/lifting objects by at least 10lbs.    Baseline  12lbs    Period  Weeks    Status  Not Met   06/25/18:  16lbs.  07/23/18:  20lbs     OT LONG TERM GOAL #5   Title  Pt will be able to use RUE for cleaning/home maintenance tasks for at least 71mn prior to rest break.    Time  8    Period  Weeks    Status  Achieved   06/25/18     OT LONG TERM GOAL #6   Title  Pt will be  able to demo at least 95* R shoulder flex for functional reaching.    Baseline  75*    Time  8    Period  Weeks    Status  On-going   06/21/18:  90*           Plan - 07/23/18 0829    Clinical Impression Statement  Pt demo improved grip strength.  Pt demo incr fatigue in shoulder today, but did a lot of activity yesterday.    Occupational Profile and client history  currently impacting functional performance  Pt is now limited in IADL performance, particularly when using dominant RUE.  Pt has to switch to nondominant LUE during ADLs/IADLs.      Occupational performance deficits (Please refer to evaluation for details):  ADL's;IADL's;Leisure;Social Participation    Rehab Potential  Good    OT Frequency  2x / week    OT Duration  8 weeks    OT Treatment/Interventions  Self-care/ADL training;Therapeutic exercise;Patient/family education;Neuromuscular education;Moist Heat;Fluidtherapy;Energy conservation;Therapist, nutritional;Therapeutic activities;Balance training;Passive range of motion;Manual Therapy;DME and/or AE instruction;Cryotherapy    Plan  check remaining goals and anticipate d/c next  session    Consulted and Agree with Plan of Care  Patient;Family member/caregiver    Family Member Consulted  husband       Patient will benefit from skilled therapeutic intervention in order to improve the following deficits and impairments:  Decreased knowledge of use of DME, Decreased coordination, Decreased mobility, Decreased strength, Decreased range of motion, Decreased endurance, Decreased activity tolerance, Decreased balance, Impaired UE functional use  Visit Diagnosis: Muscle weakness (generalized)  Abnormal posture  Other lack of coordination  Unsteadiness on feet  Other abnormalities of gait and mobility    Problem List Patient Active Problem List   Diagnosis Date Noted  . CAD (coronary artery disease) 09/04/2013  . HTN (hypertension) 09/04/2013  . HLD (hyperlipidemia) 09/04/2013  . Tobacco abuse 09/04/2013    [facility] 07/23/2018, 11:01 AM  [facility] [address] [place], [place], [address] Phone: [phone]   Fax:  [phone]  Name: Kim Sutton MRN: [id number] Date of Birth: 05/10/1934   Vianne Bulls, OTR/L [facility] [address] [place], [place]  [address] [phone] phone [phone] 07/23/18 11:01 AM

## 2018-07-27 ENCOUNTER — Ambulatory Visit: Payer: Medicare Other | Admitting: Occupational Therapy

## 2018-07-27 ENCOUNTER — Encounter: Payer: Self-pay | Admitting: Occupational Therapy

## 2018-07-27 DIAGNOSIS — R293 Abnormal posture: Secondary | ICD-10-CM

## 2018-07-27 DIAGNOSIS — R278 Other lack of coordination: Secondary | ICD-10-CM

## 2018-07-27 DIAGNOSIS — M542 Cervicalgia: Secondary | ICD-10-CM | POA: Diagnosis not present

## 2018-07-27 DIAGNOSIS — R2689 Other abnormalities of gait and mobility: Secondary | ICD-10-CM

## 2018-07-27 DIAGNOSIS — R2681 Unsteadiness on feet: Secondary | ICD-10-CM

## 2018-07-27 DIAGNOSIS — M6281 Muscle weakness (generalized): Secondary | ICD-10-CM

## 2018-07-27 NOTE — Therapy (Signed)
[facility] [address] [place], [place], [address] Phone: [phone]   Fax:  [phone]  Occupational Therapy Treatment  Patient Details  Name: Kim Sutton MRN: [id number] Date of Birth: 1934/02/11 No data recorded  Encounter Date: 07/27/2018  OT End of Session - 07/27/18 0959    Visit Number  23    Number of Visits  24    Date for OT Re-Evaluation  07/27/18    Authorization Type  cert. period  06/26/18-09/24/18    Authorization Time Period  Medicare / [employer] - Visit Number  8    Authorization - Number of Visits  10    OT Start Time  [phone]    OT Stop Time  1015    OT Time Calculation (min)  39 min    Activity Tolerance  Patient tolerated treatment well    Behavior During Therapy  WFL for tasks assessed/performed       Past Medical History:  Diagnosis Date  . Adrenal cortical adenoma of left adrenal gland    stable since 2009;  MRI in 04/2010 - no further scans  . CAD (coronary artery disease)    a. s/p NSTEMI in 2001 tx with BMS to RCA  . Colon polyps   . HLD (hyperlipidemia)   . Hx of cardiovascular stress test    Lexiscan Myoview (09/2013):  No ischemia, EF 77%, Low Risk.  Marland Kitchen Hyperglycemia   . Hypertension   . Myoclonus    on neurontin per neuro at [facility]  . Nephrolithiasis   . Nodule of right lung    a. 44m on CXR 10/2009;  CT 05/2010 ok - no further scans  . Tobacco abuse     Past Surgical History:  Procedure Laterality Date  . CHOLECYSTECTOMY    . CORONARY ANGIOPLASTY WITH STENT PLACEMENT    . TOTAL ABDOMINAL HYSTERECTOMY      There were no vitals filed for this visit.  Subjective Assessment - 07/27/18 0940    Subjective   Pt reports R eye infection, going to MD today.  "It's amazing what I can do now that I couldn't before"    Pertinent History  PMH:  transverse myelitis, hx of shingles per husband; CAD; HTN, HLD, hard of hearing    Limitations  fall risk, hard of hearing     Patient Stated Goals  improve ability to pick up things, use RUE more for cleaning, strengthening    Currently in Pain?  No/denies          Neuro re-ed:  In sitting, BUEs closed-chain/AAROM shoulder flex,chest press, diagonals/R abduction, with ball with intermittent min cues/facilitation for normal movement patterns.     Therapeutic Activities:  Placing grooved pegs in pegboard for incr coordination with min difficulty.  Removing pegs from red putty for incr R hand strength with mod difficulty   Self-Care:  Checked remaining goals and discussed progress--see below      OT Short Term Goals - 06/21/18 0812      OT SHORT TERM GOAL #1   Title  Pt will be independent with coordination, RUE strength HEP.--check STGs 05/25/18    Time  4    Period  Weeks    Status  Achieved      OT SHORT TERM GOAL #2   Title  Pt will improve RUE functional use for eating as shown by improving PPT#2 by at least 5sec.    Baseline  23.34sec  Time  4    Period  Weeks    Status  Achieved   16.0 secs     OT SHORT TERM GOAL #3   Title  Pt will improve coordination for ADLs as shown by improving time on 9-hole peg test by at least 10sec.    Baseline  59.38sec    Time  4    Period  Weeks    Status  Achieved   05/22/2018  37.69     OT SHORT TERM GOAL #4   Title  Pt will improve R grip strength for opening containers/lifting objects by at least 5lbs.    Baseline  12lbs    Time  4    Period  Weeks    Status  Achieved   05/29/18:  22lbs     OT SHORT TERM GOAL #5   Title  Pt will be able to demo at least 85* R shoulder flex for functional reaching.    Baseline  75*    Time  8    Period  Weeks    Status  Achieved   05/19/18 80*, 06/21/18:  90*       OT Long Term Goals - 07/27/18 0949      OT LONG TERM GOAL #1   Title  Pt will be able to cut food mod I using AE prn.    Time  8    Period  Weeks    Status  Achieved   06/07/18     OT LONG TERM GOAL #2   Title  Pt will be able to  use RUE to eat with at least 50% of meal.    Time  8    Period  Weeks    Status  Achieved   06/07/18:  75% per pt/husband      OT LONG TERM GOAL #3   Title  Pt will improve RUE functional reaching/coordination for ADLs as shown by improving score on box and blocks test by at least 8.    Baseline  22    Time  8    Period  Weeks    Status  Achieved   06/25/18:  28 blocks,  07/27/18:  36     OT LONG TERM GOAL #4   Title  Pt will improve R grip strength for opening containers/lifting objects by at least 10lbs.    Baseline  12lbs    Period  Weeks    Status  Achieved   06/25/18:  16lbs.  07/23/18:  20lbs.  07/27/18:  25lbs     OT LONG TERM GOAL #5   Title  Pt will be able to use RUE for cleaning/home maintenance tasks for at least 49mn prior to rest break.    Time  8    Period  Weeks    Status  Achieved   06/25/18     OT LONG TERM GOAL #6   Title  Pt will be able to demo at least 95* R shoulder flex for functional reaching.    Baseline  75*    Time  8    Period  Days    Status  Achieved   06/21/18:  90*.  07/27/18:  95*            Plan - 07/27/18 1000    Clinical Impression Statement  Pt has made excellent progress with all goals met.      Occupational Profile and client history currently impacting functional performance  Pt is now  limited in IADL performance, particularly when using dominant RUE.  Pt has to switch to nondominant LUE during ADLs/IADLs.      Occupational performance deficits (Please refer to evaluation for details):  ADL's;IADL's;Leisure;Social Participation    Rehab Potential  Good    OT Frequency  2x / week    OT Duration  8 weeks    OT Treatment/Interventions  Self-care/ADL training;Therapeutic exercise;Patient/family education;Neuromuscular education;Moist Heat;Fluidtherapy;Energy conservation;Therapist, nutritional;Therapeutic activities;Balance training;Passive range of motion;Manual Therapy;DME and/or AE instruction;Cryotherapy    Plan  d/c OT     Consulted and Agree with Plan of Care  Patient;Family member/caregiver    Family Member Consulted  husband       Patient will benefit from skilled therapeutic intervention in order to improve the following deficits and impairments:  Decreased knowledge of use of DME, Decreased coordination, Decreased mobility, Decreased strength, Decreased range of motion, Decreased endurance, Decreased activity tolerance, Decreased balance, Impaired UE functional use  Visit Diagnosis: Muscle weakness (generalized)  Abnormal posture  Other lack of coordination  Unsteadiness on feet  Other abnormalities of gait and mobility    Problem List Patient Active Problem List   Diagnosis Date Noted  . CAD (coronary artery disease) 09/04/2013  . HTN (hypertension) 09/04/2013  . HLD (hyperlipidemia) 09/04/2013  . Tobacco abuse 09/04/2013     OCCUPATIONAL THERAPY DISCHARGE SUMMARY  Visits from Start of Care: 23  Current functional level related to goals / functional outcomes: See above   Remaining deficits: Pt continues to demo decr RUE weakness, decr coordination, and abnormal posture, but all improved with significantly improved RUE functional use now.   Education / Equipment: Pt instructed in HEP and verbalized understanding.  Plan: Patient agrees to discharge.  Patient goals were met. Patient is being discharged due to meeting the stated rehab goals.  And being pleased with current functional level.?????      [facility] 07/27/2018, 10:01 AM  [facility] [address] [place], [place], [address] Phone: [phone]   Fax:  [phone]  Name: SHERILYN WINDHORST MRN: [id number] Date of Birth: 1933/12/16   Vianne Bulls, OTR/L [facility] [address] [place], [place]  [address] [phone] phone [phone] 07/27/18 10:02 AM

## 2018-08-02 DIAGNOSIS — J069 Acute upper respiratory infection, unspecified: Secondary | ICD-10-CM | POA: Diagnosis not present

## 2018-08-02 DIAGNOSIS — R05 Cough: Secondary | ICD-10-CM | POA: Diagnosis not present

## 2018-08-02 DIAGNOSIS — H1031 Unspecified acute conjunctivitis, right eye: Secondary | ICD-10-CM | POA: Diagnosis not present

## 2018-09-21 DIAGNOSIS — Z961 Presence of intraocular lens: Secondary | ICD-10-CM | POA: Diagnosis not present

## 2018-09-21 DIAGNOSIS — H524 Presbyopia: Secondary | ICD-10-CM | POA: Diagnosis not present

## 2018-09-21 DIAGNOSIS — H5340 Unspecified visual field defects: Secondary | ICD-10-CM | POA: Diagnosis not present

## 2018-11-13 DIAGNOSIS — D225 Melanocytic nevi of trunk: Secondary | ICD-10-CM | POA: Diagnosis not present

## 2018-11-13 DIAGNOSIS — L570 Actinic keratosis: Secondary | ICD-10-CM | POA: Diagnosis not present

## 2018-11-13 DIAGNOSIS — L910 Hypertrophic scar: Secondary | ICD-10-CM | POA: Diagnosis not present

## 2018-11-13 DIAGNOSIS — L218 Other seborrheic dermatitis: Secondary | ICD-10-CM | POA: Diagnosis not present

## 2018-11-13 DIAGNOSIS — D2261 Melanocytic nevi of right upper limb, including shoulder: Secondary | ICD-10-CM | POA: Diagnosis not present

## 2018-11-13 DIAGNOSIS — L821 Other seborrheic keratosis: Secondary | ICD-10-CM | POA: Diagnosis not present

## 2018-12-28 DIAGNOSIS — I700 Atherosclerosis of aorta: Secondary | ICD-10-CM | POA: Diagnosis not present

## 2018-12-28 DIAGNOSIS — Z0000 Encounter for general adult medical examination without abnormal findings: Secondary | ICD-10-CM | POA: Diagnosis not present

## 2018-12-28 DIAGNOSIS — Z1331 Encounter for screening for depression: Secondary | ICD-10-CM | POA: Diagnosis not present

## 2019-03-13 ENCOUNTER — Other Ambulatory Visit: Payer: Self-pay | Admitting: Geriatric Medicine

## 2019-03-13 DIAGNOSIS — G44209 Tension-type headache, unspecified, not intractable: Secondary | ICD-10-CM | POA: Diagnosis not present

## 2019-03-13 DIAGNOSIS — R350 Frequency of micturition: Secondary | ICD-10-CM | POA: Diagnosis not present

## 2019-03-13 DIAGNOSIS — J449 Chronic obstructive pulmonary disease, unspecified: Secondary | ICD-10-CM | POA: Diagnosis not present

## 2019-03-13 DIAGNOSIS — I10 Essential (primary) hypertension: Secondary | ICD-10-CM | POA: Diagnosis not present

## 2019-03-13 DIAGNOSIS — N644 Mastodynia: Secondary | ICD-10-CM | POA: Diagnosis not present

## 2019-03-13 DIAGNOSIS — I872 Venous insufficiency (chronic) (peripheral): Secondary | ICD-10-CM | POA: Diagnosis not present

## 2019-03-21 ENCOUNTER — Ambulatory Visit
Admission: RE | Admit: 2019-03-21 | Discharge: 2019-03-21 | Disposition: A | Discharge status: Home / Self Care | Payer: Medicare Other | Source: Ambulatory Visit | Attending: Geriatric Medicine | Admitting: Geriatric Medicine

## 2019-03-21 ENCOUNTER — Other Ambulatory Visit: Payer: Self-pay

## 2019-03-21 DIAGNOSIS — N6002 Solitary cyst of left breast: Secondary | ICD-10-CM | POA: Diagnosis not present

## 2019-03-21 DIAGNOSIS — N644 Mastodynia: Secondary | ICD-10-CM

## 2019-03-21 DIAGNOSIS — R928 Other abnormal and inconclusive findings on diagnostic imaging of breast: Secondary | ICD-10-CM | POA: Diagnosis not present

## 2019-03-25 DIAGNOSIS — M810 Age-related osteoporosis without current pathological fracture: Secondary | ICD-10-CM | POA: Diagnosis not present

## 2019-03-25 DIAGNOSIS — I2510 Atherosclerotic heart disease of native coronary artery without angina pectoris: Secondary | ICD-10-CM | POA: Diagnosis not present

## 2019-03-25 DIAGNOSIS — J449 Chronic obstructive pulmonary disease, unspecified: Secondary | ICD-10-CM | POA: Diagnosis not present

## 2019-03-25 DIAGNOSIS — E7800 Pure hypercholesterolemia, unspecified: Secondary | ICD-10-CM | POA: Diagnosis not present

## 2019-03-25 DIAGNOSIS — I10 Essential (primary) hypertension: Secondary | ICD-10-CM | POA: Diagnosis not present

## 2019-04-25 DIAGNOSIS — J449 Chronic obstructive pulmonary disease, unspecified: Secondary | ICD-10-CM | POA: Diagnosis not present

## 2019-04-25 DIAGNOSIS — I872 Venous insufficiency (chronic) (peripheral): Secondary | ICD-10-CM | POA: Diagnosis not present

## 2019-04-25 DIAGNOSIS — I10 Essential (primary) hypertension: Secondary | ICD-10-CM | POA: Diagnosis not present

## 2019-04-25 DIAGNOSIS — F5101 Primary insomnia: Secondary | ICD-10-CM | POA: Diagnosis not present

## 2019-05-12 ENCOUNTER — Emergency Department (HOSPITAL_BASED_OUTPATIENT_CLINIC_OR_DEPARTMENT_OTHER)
Admission: EM | Admit: 2019-05-12 | Discharge: 2019-05-12 | Disposition: A | Discharge status: Home / Self Care | Payer: Medicare Other | Attending: Emergency Medicine | Admitting: Emergency Medicine

## 2019-05-12 ENCOUNTER — Other Ambulatory Visit: Payer: Self-pay

## 2019-05-12 ENCOUNTER — Encounter (HOSPITAL_BASED_OUTPATIENT_CLINIC_OR_DEPARTMENT_OTHER): Payer: Self-pay | Admitting: Emergency Medicine

## 2019-05-12 ENCOUNTER — Emergency Department (HOSPITAL_BASED_OUTPATIENT_CLINIC_OR_DEPARTMENT_OTHER): Payer: Medicare Other

## 2019-05-12 DIAGNOSIS — E785 Hyperlipidemia, unspecified: Secondary | ICD-10-CM | POA: Insufficient documentation

## 2019-05-12 DIAGNOSIS — Z87891 Personal history of nicotine dependence: Secondary | ICD-10-CM | POA: Diagnosis not present

## 2019-05-12 DIAGNOSIS — Z9861 Coronary angioplasty status: Secondary | ICD-10-CM | POA: Diagnosis not present

## 2019-05-12 DIAGNOSIS — I252 Old myocardial infarction: Secondary | ICD-10-CM | POA: Insufficient documentation

## 2019-05-12 DIAGNOSIS — I10 Essential (primary) hypertension: Secondary | ICD-10-CM | POA: Diagnosis not present

## 2019-05-12 DIAGNOSIS — M25551 Pain in right hip: Secondary | ICD-10-CM | POA: Diagnosis not present

## 2019-05-12 DIAGNOSIS — Z79899 Other long term (current) drug therapy: Secondary | ICD-10-CM | POA: Diagnosis not present

## 2019-05-12 DIAGNOSIS — I2510 Atherosclerotic heart disease of native coronary artery without angina pectoris: Secondary | ICD-10-CM | POA: Diagnosis not present

## 2019-05-12 DIAGNOSIS — Z881 Allergy status to other antibiotic agents status: Secondary | ICD-10-CM | POA: Diagnosis not present

## 2019-05-12 DIAGNOSIS — M25561 Pain in right knee: Secondary | ICD-10-CM | POA: Insufficient documentation

## 2019-05-12 DIAGNOSIS — S79911A Unspecified injury of right hip, initial encounter: Secondary | ICD-10-CM | POA: Diagnosis not present

## 2019-05-12 DIAGNOSIS — S8991XA Unspecified injury of right lower leg, initial encounter: Secondary | ICD-10-CM | POA: Diagnosis not present

## 2019-05-12 DIAGNOSIS — W19XXXA Unspecified fall, initial encounter: Secondary | ICD-10-CM

## 2019-05-12 NOTE — ED Notes (Signed)
Patient transported to X-ray 

## 2019-05-12 NOTE — ED Provider Notes (Signed)
[facility] EMERGENCY DEPARTMENT Provider Note   CSN: [id number] Arrival date & time: 05/12/19  1220     History   Chief Complaint Chief Complaint  Patient presents with  . Fall    HPI Kim Sutton is a 85 y.o. female.     HPI   Last evening sat on edge of couch then slid off towards the right which is her weak side, husband partially caught her, easing the fall, continuing pain right hip/upper leg. Walks with walker when she goes out, today has been using walker around house,  Is bearing weight, but having pain. Walking better today than yesterday. No head trauma, no neck pain.  No back pain (has transverse myelitis)   Past Medical History:  Diagnosis Date  . Adrenal cortical adenoma of left adrenal gland    stable since 2009;  MRI in 04/2010 - no further scans  . CAD (coronary artery disease)    a. s/p NSTEMI in 2001 tx with BMS to RCA  . Colon polyps   . HLD (hyperlipidemia)   . Hx of cardiovascular stress test    Lexiscan Myoview (09/2013):  No ischemia, EF 77%, Low Risk.  Marland Kitchen Hyperglycemia   . Hypertension   . Myoclonus    on neurontin per neuro at [facility]  . Nephrolithiasis   . Nodule of right lung    a. 17mm on CXR 10/2009;  CT 05/2010 ok - no further scans  . Tobacco abuse     Patient Active Problem List   Diagnosis Date Noted  . CAD (coronary artery disease) 09/04/2013  . HTN (hypertension) 09/04/2013  . HLD (hyperlipidemia) 09/04/2013  . Tobacco abuse 09/04/2013    Past Surgical History:  Procedure Laterality Date  . CHOLECYSTECTOMY    . CORONARY ANGIOPLASTY WITH STENT PLACEMENT    . TOTAL ABDOMINAL HYSTERECTOMY       OB History   No obstetric history on file.      Home Medications    Prior to Admission medications   Medication Sig Start Date End Date Taking? Authorizing Provider  amLODipine (NORVASC) 5 MG tablet  07/22/13   [provider]  aspirin 81 MG tablet Take 81 mg by mouth daily.    [provider]   lisinopril-hydrochlorothiazide Reita May) 20-12.5 MG per tablet  07/22/13   [provider]  metoprolol succinate (TOPROL-XL) 50 MG 24 hr tablet Take 50 mg by mouth daily. Take with or immediately following a meal.    [provider]  simvastatin (ZOCOR) 20 MG tablet Take 20 mg by mouth daily.    [provider]  vitamin B-12 (CYANOCOBALAMIN) 1000 MCG tablet Take by mouth.    [provider]  ZETIA 10 MG tablet  07/22/13   [provider]    Family History History reviewed. No pertinent family history.  Social History Social History   Tobacco Use  . Smoking status: Former Smoker  Substance Use Topics  . Alcohol use: No  . Drug use: Not on file     Allergies   Ceclor [cefaclor]   Review of Systems Review of Systems  Constitutional: Negative for fever.  HENT: Negative for sore throat.   Eyes: Negative for visual disturbance.  Respiratory: Negative for cough and shortness of breath.   Cardiovascular: Negative for chest pain.  Gastrointestinal: Negative for abdominal pain, nausea and vomiting.  Genitourinary: Negative for difficulty urinating.  Musculoskeletal: Positive for arthralgias. Negative for back pain and neck pain.  Skin: Negative  for rash.  Neurological: Negative for syncope and headaches.     Physical Exam Updated Vital Signs BP (!) 168/74 (BP Location: Right Arm)   Pulse (!) 59   Temp 99.1 F (37.3 C) (Oral)   Resp 16   Ht 5\' 6"  (1.676 m)   Wt 74.8 kg   SpO2 97%   BMI 26.63 kg/m   Physical Exam Vitals signs and nursing note reviewed.  Constitutional:      General: She is not in acute distress.    Appearance: She is well-developed. She is not diaphoretic.  HENT:     Head: Normocephalic and atraumatic.  Eyes:     Conjunctiva/sclera: Conjunctivae normal.  Neck:     Musculoskeletal: Normal range of motion.  Cardiovascular:     Rate and Rhythm: Normal rate and regular rhythm.     Heart  sounds: Normal heart sounds. No murmur. No friction rub. No gallop.   Pulmonary:     Effort: Pulmonary effort is normal. No respiratory distress.     Breath sounds: Normal breath sounds. No wheezing or rales.  Abdominal:     General: There is no distension.     Palpations: Abdomen is soft.     Tenderness: There is no abdominal tenderness. There is no guarding.  Musculoskeletal:     Right hip: She exhibits tenderness and bony tenderness.     Right upper leg: She exhibits tenderness and bony tenderness.  Skin:    General: Skin is warm and dry.     Findings: No erythema or rash.  Neurological:     Mental Status: She is alert and oriented to person, place, and time.      ED Treatments / Results  Labs (all labs ordered are listed, but only abnormal results are displayed) Labs Reviewed - No data to display  EKG None  Radiology Dg Knee Complete 4 Views Right  Result Date: 05/12/2019 CLINICAL DATA:  Pain post fall. EXAM: RIGHT KNEE - COMPLETE 4+ VIEW COMPARISON:  None. FINDINGS: No evidence of fracture, dislocation, or joint effusion. No evidence of arthropathy or other focal bone abnormality. Vascular calcifications noted. IMPRESSION: 1. No acute fracture or dislocation identified about the right knee. 2. Vascular calcifications. Electronically Signed   By: Fidela Salisbury M.D.   On: 05/12/2019 14:30   Dg Hip Unilat  With Pelvis 2-3 Views Right  Result Date: 05/12/2019 CLINICAL DATA:  Right hip pain post fall. EXAM: DG HIP (WITH OR WITHOUT PELVIS) 2-3V RIGHT COMPARISON:  None. FINDINGS: There is no evidence of hip fracture or dislocation. Osteopenia. Mild osteoarthritic changes of the bilateral hips. Vascular calcifications noted. IMPRESSION: 1. No evidence of hip fracture. 2. Mild osteoarthritic changes of the bilateral hips. Electronically Signed   By: Fidela Salisbury M.D.   On: 05/12/2019 14:30    Procedures Procedures (including critical care time)  Medications Ordered  in ED Medications - No data to display   Initial Impression / Assessment and Plan / ED Course  I have reviewed the triage vital signs and the nursing notes.  Pertinent labs & imaging results that were available during my care of the patient were reviewed by me and considered in my medical decision making (see chart for details).        85yo female with history of hypertension, CAD, transverse myelitis with right sided weakness presents with concern for right hip pain after fall from couch last night.  No other areas of pain or injury by history or exam, low  mechanism.  XR without signs of fracture or dislocation.  Patient is bearing weight, improved from yesterday. Suspect likely muscular pain/contusion but discussed if does not improve recommend repeat or advanced imaging.  Patient discharged in stable condition with understanding of reasons to return.  Final Clinical Impressions(s) / ED Diagnoses   Final diagnoses:  Fall, initial encounter  Right hip pain    ED Discharge Orders    None       Gareth Morgan, MD 05/13/19 1140

## 2019-05-12 NOTE — ED Triage Notes (Signed)
Pt reports fall from chair yesterday, landing on right hip/thigh. Pt reports hx of transverse myelitis, causing right side weakness. Denies loc, denies dizziness. Pt reports 1 g tylenol at 0400 today with mild relief

## 2019-05-14 DIAGNOSIS — J449 Chronic obstructive pulmonary disease, unspecified: Secondary | ICD-10-CM | POA: Diagnosis not present

## 2019-05-14 DIAGNOSIS — I10 Essential (primary) hypertension: Secondary | ICD-10-CM | POA: Diagnosis not present

## 2019-05-14 DIAGNOSIS — E7800 Pure hypercholesterolemia, unspecified: Secondary | ICD-10-CM | POA: Diagnosis not present

## 2019-05-14 DIAGNOSIS — M810 Age-related osteoporosis without current pathological fracture: Secondary | ICD-10-CM | POA: Diagnosis not present

## 2019-05-14 DIAGNOSIS — I2510 Atherosclerotic heart disease of native coronary artery without angina pectoris: Secondary | ICD-10-CM | POA: Diagnosis not present

## 2019-06-18 DIAGNOSIS — F5101 Primary insomnia: Secondary | ICD-10-CM | POA: Diagnosis not present

## 2019-06-18 DIAGNOSIS — J449 Chronic obstructive pulmonary disease, unspecified: Secondary | ICD-10-CM | POA: Diagnosis not present

## 2019-06-18 DIAGNOSIS — R350 Frequency of micturition: Secondary | ICD-10-CM | POA: Diagnosis not present

## 2019-06-18 DIAGNOSIS — I10 Essential (primary) hypertension: Secondary | ICD-10-CM | POA: Diagnosis not present

## 2019-06-25 ENCOUNTER — Other Ambulatory Visit: Payer: Self-pay

## 2019-06-25 ENCOUNTER — Emergency Department (HOSPITAL_BASED_OUTPATIENT_CLINIC_OR_DEPARTMENT_OTHER)
Admission: EM | Admit: 2019-06-25 | Discharge: 2019-06-25 | Disposition: A | Discharge status: Home / Self Care | Payer: Medicare Other | Attending: Emergency Medicine | Admitting: Emergency Medicine

## 2019-06-25 ENCOUNTER — Encounter (HOSPITAL_BASED_OUTPATIENT_CLINIC_OR_DEPARTMENT_OTHER): Payer: Self-pay

## 2019-06-25 DIAGNOSIS — W208XXA Other cause of strike by thrown, projected or falling object, initial encounter: Secondary | ICD-10-CM | POA: Insufficient documentation

## 2019-06-25 DIAGNOSIS — Y999 Unspecified external cause status: Secondary | ICD-10-CM | POA: Diagnosis not present

## 2019-06-25 DIAGNOSIS — Z7982 Long term (current) use of aspirin: Secondary | ICD-10-CM | POA: Insufficient documentation

## 2019-06-25 DIAGNOSIS — Z87891 Personal history of nicotine dependence: Secondary | ICD-10-CM | POA: Diagnosis not present

## 2019-06-25 DIAGNOSIS — Z23 Encounter for immunization: Secondary | ICD-10-CM | POA: Diagnosis not present

## 2019-06-25 DIAGNOSIS — I2510 Atherosclerotic heart disease of native coronary artery without angina pectoris: Secondary | ICD-10-CM | POA: Insufficient documentation

## 2019-06-25 DIAGNOSIS — S81812A Laceration without foreign body, left lower leg, initial encounter: Secondary | ICD-10-CM | POA: Diagnosis not present

## 2019-06-25 DIAGNOSIS — Z79899 Other long term (current) drug therapy: Secondary | ICD-10-CM | POA: Diagnosis not present

## 2019-06-25 DIAGNOSIS — Y929 Unspecified place or not applicable: Secondary | ICD-10-CM | POA: Diagnosis not present

## 2019-06-25 DIAGNOSIS — I10 Essential (primary) hypertension: Secondary | ICD-10-CM | POA: Insufficient documentation

## 2019-06-25 DIAGNOSIS — Y9389 Activity, other specified: Secondary | ICD-10-CM | POA: Insufficient documentation

## 2019-06-25 DIAGNOSIS — S8992XA Unspecified injury of left lower leg, initial encounter: Secondary | ICD-10-CM | POA: Diagnosis present

## 2019-06-25 MED ORDER — TETANUS-DIPHTH-ACELL PERTUSSIS 5-2.5-18.5 LF-MCG/0.5 IM SUSP
0.5000 mL | Freq: Once | INTRAMUSCULAR | Status: AC
  Administered 2019-06-25: 0.5 mL via INTRAMUSCULAR
  Filled 2019-06-25: qty 0.5

## 2019-06-25 NOTE — Discharge Instructions (Addendum)
1. Medications: Tylenol or ibuprofen for pain, usual home medications  2. Treatment: ice for swelling, keep wound clean with warm soap and water and keep bandage dry, do not submerge in water for 24 hours  3. Follow Up: Please follow up with primary care provider in 2-5 days for wound check. Return to the emergency department for increased redness, drainage of pus from the wound   WOUND CARE  Keep area clean and dry for 24 hours. Do not remove bandage, if applied.  After 24 hours, remove bandage and wash wound gently with mild soap and warm water. Reapply a new bandage after cleaning wound, if directed.   Continue daily cleansing with soap and water. Return if you experience any of the following signs of infection: Swelling, redness, pus drainage, streaking, fever >101.0 F  Return if you experience excessive bleeding that does not stop after 15-20 minutes of constant, firm pressure.

## 2019-06-25 NOTE — ED Triage Notes (Signed)
Pt dropped a case of soda cans on left LE ~30 min PTA-husband with pt-NAD-to triage in w/c

## 2019-06-25 NOTE — ED Notes (Signed)
Derma clip and benzine at bedside

## 2019-06-25 NOTE — ED Provider Notes (Signed)
[facility] EMERGENCY DEPARTMENT Provider Note   CSN: [id number] Arrival date & time: 06/25/19  1401     History   Chief Complaint Chief Complaint  Patient presents with  . Leg Injury    HPI Kim Sutton is a 85 y.o. female with past medical history as listed below presents to emergency department today with chief complaint of leg injury.  Onset was acute happening just prior to arrival.  Patient states that she was loading boxes of soda cans into her car and accidentally dropped one hitting her in the left shin.  She has a laceration in the area.  She is describing dull aching pain localized to laceration.  She is able to ambulate and bear weight on left lower extremity.  Tetanus immunization is not up-to-date.  Denies any numbness, tingling, weakness, fever, chills.  She did not fall.  Patient is not anticoagulated.    Past Medical History:  Diagnosis Date  . Adrenal cortical adenoma of left adrenal gland    stable since 2009;  MRI in 04/2010 - no further scans  . CAD (coronary artery disease)    a. s/p NSTEMI in 2001 tx with BMS to RCA  . Colon polyps   . HLD (hyperlipidemia)   . Hx of cardiovascular stress test    Lexiscan Myoview (09/2013):  No ischemia, EF 77%, Low Risk.  Marland Kitchen Hyperglycemia   . Hypertension   . Myoclonus    on neurontin per neuro at [facility]  . Nephrolithiasis   . Nodule of right lung    a. 68mm on CXR 10/2009;  CT 05/2010 ok - no further scans  . Tobacco abuse     Patient Active Problem List   Diagnosis Date Noted  . CAD (coronary artery disease) 09/04/2013  . HTN (hypertension) 09/04/2013  . HLD (hyperlipidemia) 09/04/2013  . Tobacco abuse 09/04/2013    Past Surgical History:  Procedure Laterality Date  . CHOLECYSTECTOMY    . CORONARY ANGIOPLASTY WITH STENT PLACEMENT    . TOTAL ABDOMINAL HYSTERECTOMY       OB History   No obstetric history on file.      Home Medications    Prior to Admission medications   Medication Sig  Start Date End Date Taking? Authorizing Provider  amLODipine (NORVASC) 5 MG tablet  07/22/13   [provider]  aspirin 81 MG tablet Take 81 mg by mouth daily.    [provider]  lisinopril-hydrochlorothiazide Reita May) 20-12.5 MG per tablet  07/22/13   [provider]  metoprolol succinate (TOPROL-XL) 50 MG 24 hr tablet Take 50 mg by mouth daily. Take with or immediately following a meal.    [provider]  simvastatin (ZOCOR) 20 MG tablet Take 20 mg by mouth daily.    [provider]  vitamin B-12 (CYANOCOBALAMIN) 1000 MCG tablet Take by mouth.    [provider]  ZETIA 10 MG tablet  07/22/13   [provider]    Family History No family history on file.  Social History Social History   Tobacco Use  . Smoking status: Former Research scientist (life sciences)  . Smokeless tobacco: Never Used  Substance Use Topics  . Alcohol use: No  . Drug use: Not on file     Allergies   Ceclor [cefaclor]   Review of Systems Review of Systems  Constitutional: Negative for chills and fever.  Musculoskeletal: Negative for arthralgias, gait problem, joint swelling and myalgias.  Skin: Positive for wound.  Neurological: Negative for  weakness and numbness.     Physical Exam Updated Vital Signs BP (!) 147/71 (BP Location: Right Arm)   Pulse 60   Temp 98.7 F (37.1 C) (Oral)   Resp 14   Ht 5\' 6"  (1.676 m)   Wt 75.3 kg   SpO2 98%   BMI 26.79 kg/m   Physical Exam Vitals signs and nursing note reviewed.  Constitutional:      Appearance: She is well-developed. She is not ill-appearing or toxic-appearing.  HENT:     Head: Normocephalic and atraumatic.     Nose: Nose normal.  Eyes:     General: No scleral icterus.       Right eye: No discharge.        Left eye: No discharge.     Conjunctiva/sclera: Conjunctivae normal.  Neck:     Musculoskeletal: Normal range of motion.     Vascular: No JVD.  Cardiovascular:     Rate and Rhythm:  Normal rate and regular rhythm.     Pulses: Normal pulses.     Heart sounds: Normal heart sounds.  Pulmonary:     Effort: Pulmonary effort is normal.     Breath sounds: Normal breath sounds.  Abdominal:     General: There is no distension.  Musculoskeletal: Normal range of motion.     Right lower leg: No edema.     Left lower leg: No edema.  Skin:    General: Skin is warm and dry.          Comments: 7 cm superficial laceration to left shin.  Bleeding is controlled.  Neurological:     Mental Status: She is oriented to person, place, and time.     GCS: GCS eye subscore is 4. GCS verbal subscore is 5. GCS motor subscore is 6.     Comments: Fluent speech, no facial droop.  Psychiatric:        Behavior: Behavior normal.      ED Treatments / Results  Labs (all labs ordered are listed, but only abnormal results are displayed) Labs Reviewed - No data to display  EKG None  Radiology No results found.  Procedures Procedures (including critical care time)  Medications Ordered in ED Medications  Tdap (BOOSTRIX) injection 0.5 mL (0.5 mLs Intramuscular Given 06/25/19 1542)     Initial Impression / Assessment and Plan / ED Course  I have reviewed the triage vital signs and the nursing notes.  Pertinent labs & imaging results that were available during my care of the patient were reviewed by me and considered in my medical decision making (see chart for details).  Patient presents to the emergency department with laceration to left shin which occurred within 2 hours PTA. Patient nontoxic appearing, resting comfortably.  She ambulates with steady gait.  She is weightbearing on left lower extremity.  Laceration is superficial and more consistent with a skin tear.  Do not feel that x-rays necessary at this time, patient agrees to hold off on imaging at this time. Pressure irrigation performed. Wound explored and base of wound visualized in a bloodless field without evidence of foreign  body. Laceration is not amendable to suture repair.  Dressed with Xeroform and bulky dressing.  Patient tolerated well.  Tetanus updated at today's visit. Do not feel that abx are indicated at this time based on wound appearance. Discussed wound home care as well as need for wound recheck within 1 week.  I discussed results, treatment plan, need for follow-up, and return  precautions with the patient including signs of infection. Provided opportunity for questions, patient confirmed understanding and is in agreement with plan. The patient was discussed with and seen by Dr. Langston Masker who agrees with the treatment plan.      Final Clinical Impressions(s) / ED Diagnoses   Final diagnoses:  Leg laceration, left, initial encounter    ED Discharge Orders    None       Cherre Robins, PA-C 06/25/19 1556    Tegeler, Gwenyth Allegra, MD 06/25/19 2358

## 2019-07-13 ENCOUNTER — Encounter (HOSPITAL_BASED_OUTPATIENT_CLINIC_OR_DEPARTMENT_OTHER): Payer: Self-pay

## 2019-07-13 ENCOUNTER — Emergency Department (HOSPITAL_BASED_OUTPATIENT_CLINIC_OR_DEPARTMENT_OTHER)
Admission: EM | Admit: 2019-07-13 | Discharge: 2019-07-13 | Disposition: A | Discharge status: Home / Self Care | Payer: Medicare Other | Attending: Emergency Medicine | Admitting: Emergency Medicine

## 2019-07-13 DIAGNOSIS — I10 Essential (primary) hypertension: Secondary | ICD-10-CM | POA: Diagnosis not present

## 2019-07-13 DIAGNOSIS — Z881 Allergy status to other antibiotic agents status: Secondary | ICD-10-CM | POA: Insufficient documentation

## 2019-07-13 DIAGNOSIS — Y929 Unspecified place or not applicable: Secondary | ICD-10-CM | POA: Diagnosis not present

## 2019-07-13 DIAGNOSIS — Y939 Activity, unspecified: Secondary | ICD-10-CM | POA: Insufficient documentation

## 2019-07-13 DIAGNOSIS — Z79899 Other long term (current) drug therapy: Secondary | ICD-10-CM | POA: Insufficient documentation

## 2019-07-13 DIAGNOSIS — I2510 Atherosclerotic heart disease of native coronary artery without angina pectoris: Secondary | ICD-10-CM | POA: Insufficient documentation

## 2019-07-13 DIAGNOSIS — W208XXA Other cause of strike by thrown, projected or falling object, initial encounter: Secondary | ICD-10-CM | POA: Insufficient documentation

## 2019-07-13 DIAGNOSIS — Y999 Unspecified external cause status: Secondary | ICD-10-CM | POA: Diagnosis not present

## 2019-07-13 DIAGNOSIS — Z87891 Personal history of nicotine dependence: Secondary | ICD-10-CM | POA: Insufficient documentation

## 2019-07-13 DIAGNOSIS — E785 Hyperlipidemia, unspecified: Secondary | ICD-10-CM | POA: Insufficient documentation

## 2019-07-13 DIAGNOSIS — S81802A Unspecified open wound, left lower leg, initial encounter: Secondary | ICD-10-CM | POA: Insufficient documentation

## 2019-07-13 MED ORDER — BACITRACIN ZINC 500 UNIT/GM EX OINT: 1.0000 "application " | TOPICAL_OINTMENT | Freq: Two times a day (BID) | CUTANEOUS | 0 refills | Status: DC

## 2019-07-13 MED ORDER — BACITRACIN ZINC 500 UNIT/GM EX OINT
TOPICAL_OINTMENT | Freq: Two times a day (BID) | CUTANEOUS | Status: DC
  Administered 2019-07-13: 1 via TOPICAL

## 2019-07-13 NOTE — ED Triage Notes (Signed)
Continued pain in left leg, worsening swelling.

## 2019-07-13 NOTE — Discharge Instructions (Signed)
Clean wound with Dove soap, apply Bacitracin twice daily. Elevate the leg to help with swelling. Recheck with your doctor later this week, sooner if the wound developed cloudy drainage or surrounding redness.

## 2019-07-13 NOTE — ED Provider Notes (Signed)
[facility] EMERGENCY DEPARTMENT Provider Note   CSN: [id number] Arrival date & time: 07/13/19  [id number]     History Chief Complaint  Patient presents with  . Leg Pain    Kim Sutton is a 85 y.o. female.  85 year old female presents with husband for left lower leg wound.  Patient was seen in the emergency room on 06/25/2019 for left lower leg wound after a case of soda fell and hit her in the leg causing a skin tear to the leg.  Husband has been cleaning wound with diluted peroxide, concerned wound is not healing well and may be infected.        Past Medical History:  Diagnosis Date  . Adrenal cortical adenoma of left adrenal gland    stable since 2009;  MRI in 04/2010 - no further scans  . CAD (coronary artery disease)    a. s/p NSTEMI in 2001 tx with BMS to RCA  . Colon polyps   . HLD (hyperlipidemia)   . Hx of cardiovascular stress test    Lexiscan Myoview (09/2013):  No ischemia, EF 77%, Low Risk.  Marland Kitchen Hyperglycemia   . Hypertension   . Myoclonus    on neurontin per neuro at [facility]  . Nephrolithiasis   . Nodule of right lung    a. 31mm on CXR 10/2009;  CT 05/2010 ok - no further scans  . Tobacco abuse     Patient Active Problem List   Diagnosis Date Noted  . CAD (coronary artery disease) 09/04/2013  . HTN (hypertension) 09/04/2013  . HLD (hyperlipidemia) 09/04/2013  . Tobacco abuse 09/04/2013    Past Surgical History:  Procedure Laterality Date  . CHOLECYSTECTOMY    . CORONARY ANGIOPLASTY WITH STENT PLACEMENT    . TOTAL ABDOMINAL HYSTERECTOMY       OB History   No obstetric history on file.     No family history on file.  Social History   Tobacco Use  . Smoking status: Former Research scientist (life sciences)  . Smokeless tobacco: Never Used  Substance Use Topics  . Alcohol use: No  . Drug use: Not on file    Home Medications Prior to Admission medications   Medication Sig Start Date End Date Taking? Authorizing Provider  amLODipine (NORVASC) 5 MG tablet   07/22/13   [provider]  aspirin 81 MG tablet Take 81 mg by mouth daily.    [provider]  bacitracin ointment Apply 1 application topically 2 (two) times daily. 07/13/19   Tacy Learn, PA-C  lisinopril-hydrochlorothiazide (PRINZIDE,ZESTORETIC) 20-12.5 MG per tablet  07/22/13   [provider]  metoprolol succinate (TOPROL-XL) 50 MG 24 hr tablet Take 50 mg by mouth daily. Take with or immediately following a meal.    [provider]  simvastatin (ZOCOR) 20 MG tablet Take 20 mg by mouth daily.    [provider]  vitamin B-12 (CYANOCOBALAMIN) 1000 MCG tablet Take by mouth.    [provider]  ZETIA 10 MG tablet  07/22/13   [provider]    Allergies    Ceclor [cefaclor]  Review of Systems   Review of Systems  Constitutional: Negative for fever.  Musculoskeletal: Negative for arthralgias and myalgias.  Skin: Positive for wound.  Allergic/Immunologic: Negative for immunocompromised state.  Neurological: Negative for weakness and numbness.  All other systems reviewed and are negative.   Physical Exam Updated Vital Signs BP 129/73 (BP Location: Left Arm)   Pulse (!) 54   Temp  98.6 F (37 C) (Oral)   Resp 16   Ht 5\' 6"  (1.676 m)   Wt 74.8 kg   SpO2 96%   BMI 26.63 kg/m   Physical Exam Vitals and nursing note reviewed.  Constitutional:      General: She is not in acute distress.    Appearance: She is well-developed. She is not diaphoretic.  HENT:     Head: Normocephalic and atraumatic.  Cardiovascular:     Pulses: Normal pulses.  Pulmonary:     Effort: Pulmonary effort is normal.  Musculoskeletal:        General: Tenderness and signs of injury present.     Right lower leg: Edema present.     Left lower leg: Edema present.       Legs:     Comments: Mild symmetric edema to bilateral feet  Skin:    General: Skin is warm and dry.     Findings: No erythema.  Neurological:     Mental Status: She is  alert and oriented to person, place, and time.  Psychiatric:        Behavior: Behavior normal.    Media Information   Document Information  Photos    07/13/2019 10:26  Attached To:  Hospital Encounter on 07/13/19  Source Information  Roque Lias  [facility]    ED Results / Procedures / Treatments   Labs (all labs ordered are listed, but only abnormal results are displayed) Labs Reviewed - No data to display  EKG None  Radiology No results found.  Procedures Procedures (including critical care time)  Medications Ordered in ED Medications  bacitracin ointment (1 application Topical Given 07/13/19 1041)    ED Course  I have reviewed the triage vital signs and the nursing notes.  Pertinent labs & imaging results that were available during my care of the patient were reviewed by me and considered in my medical decision making (see chart for details).  Clinical Course as of Jul 12 1105  Sat Jul 13, 3347  [id number] 85 year old female with left lower leg wound.  Wound as pictured in chart, no purulent drainage, no surrounding erythema, do not suspect wound is infected at this time.  Advised to stop using peroxide on the wound, may use a mild soap such as Dove and dressed with bacitracin.  Also recommend elevating legs to help decrease swelling.  Patient should have wound check with PCP later in the week, sooner if she develops redness around the area or purulent drainage.  Patient's husband verbalizes understanding of discharge instructions and plan.   [LM]    Clinical Course User Index [LM] Roque Lias   MDM Rules/Calculators/A&P      Final Clinical Impression(s) / ED Diagnoses Final diagnoses:  Wound of left lower extremity, initial encounter    Rx / DC Orders ED Discharge Orders         Ordered    bacitracin ointment  2 times daily     07/13/19 1028           Roque Lias 07/13/19 1106    Davonna Belling,  MD 07/13/19 1520

## 2019-07-29 ENCOUNTER — Other Ambulatory Visit (HOSPITAL_COMMUNITY): Payer: Self-pay | Admitting: Geriatric Medicine

## 2019-07-29 DIAGNOSIS — M79605 Pain in left leg: Secondary | ICD-10-CM

## 2019-07-30 ENCOUNTER — Other Ambulatory Visit: Payer: Self-pay

## 2019-07-30 ENCOUNTER — Ambulatory Visit (HOSPITAL_COMMUNITY)
Admission: RE | Admit: 2019-07-30 | Discharge: 2019-07-30 | Disposition: A | Discharge status: Home / Self Care | Payer: Medicare Other | Source: Ambulatory Visit | Attending: Geriatric Medicine | Admitting: Geriatric Medicine

## 2019-07-30 DIAGNOSIS — M79605 Pain in left leg: Secondary | ICD-10-CM | POA: Insufficient documentation

## 2019-07-30 DIAGNOSIS — M7989 Other specified soft tissue disorders: Secondary | ICD-10-CM | POA: Diagnosis present

## 2019-07-30 NOTE — Progress Notes (Signed)
Left lower extremity venous duplex completed. Refer to "CV Proc" under chart review to view preliminary results.  07/30/2019 10:16 AM Kelby Aline., MHA, RVT, RDCS, RDMS

## 2019-08-09 DIAGNOSIS — E7800 Pure hypercholesterolemia, unspecified: Secondary | ICD-10-CM | POA: Diagnosis not present

## 2019-08-09 DIAGNOSIS — M810 Age-related osteoporosis without current pathological fracture: Secondary | ICD-10-CM | POA: Diagnosis not present

## 2019-08-09 DIAGNOSIS — I10 Essential (primary) hypertension: Secondary | ICD-10-CM | POA: Diagnosis not present

## 2019-08-09 DIAGNOSIS — I2510 Atherosclerotic heart disease of native coronary artery without angina pectoris: Secondary | ICD-10-CM | POA: Diagnosis not present

## 2019-08-09 DIAGNOSIS — J449 Chronic obstructive pulmonary disease, unspecified: Secondary | ICD-10-CM | POA: Diagnosis not present

## 2019-08-23 DIAGNOSIS — E7800 Pure hypercholesterolemia, unspecified: Secondary | ICD-10-CM | POA: Diagnosis not present

## 2019-08-23 DIAGNOSIS — I872 Venous insufficiency (chronic) (peripheral): Secondary | ICD-10-CM | POA: Diagnosis not present

## 2019-08-23 DIAGNOSIS — I10 Essential (primary) hypertension: Secondary | ICD-10-CM | POA: Diagnosis not present

## 2019-08-23 DIAGNOSIS — I700 Atherosclerosis of aorta: Secondary | ICD-10-CM | POA: Diagnosis not present

## 2019-08-23 DIAGNOSIS — Z23 Encounter for immunization: Secondary | ICD-10-CM | POA: Diagnosis not present

## 2019-08-23 DIAGNOSIS — J449 Chronic obstructive pulmonary disease, unspecified: Secondary | ICD-10-CM | POA: Diagnosis not present

## 2019-08-23 DIAGNOSIS — S81802D Unspecified open wound, left lower leg, subsequent encounter: Secondary | ICD-10-CM | POA: Diagnosis not present

## 2019-09-16 DIAGNOSIS — J449 Chronic obstructive pulmonary disease, unspecified: Secondary | ICD-10-CM | POA: Diagnosis not present

## 2019-09-16 DIAGNOSIS — I2510 Atherosclerotic heart disease of native coronary artery without angina pectoris: Secondary | ICD-10-CM | POA: Diagnosis not present

## 2019-09-16 DIAGNOSIS — E7800 Pure hypercholesterolemia, unspecified: Secondary | ICD-10-CM | POA: Diagnosis not present

## 2019-09-16 DIAGNOSIS — M810 Age-related osteoporosis without current pathological fracture: Secondary | ICD-10-CM | POA: Diagnosis not present

## 2019-09-16 DIAGNOSIS — I10 Essential (primary) hypertension: Secondary | ICD-10-CM | POA: Diagnosis not present

## 2019-09-23 DIAGNOSIS — H53002 Unspecified amblyopia, left eye: Secondary | ICD-10-CM | POA: Diagnosis not present

## 2019-09-23 DIAGNOSIS — H5203 Hypermetropia, bilateral: Secondary | ICD-10-CM | POA: Diagnosis not present

## 2019-09-23 DIAGNOSIS — Z961 Presence of intraocular lens: Secondary | ICD-10-CM | POA: Diagnosis not present

## 2019-10-10 DIAGNOSIS — M810 Age-related osteoporosis without current pathological fracture: Secondary | ICD-10-CM | POA: Diagnosis not present

## 2019-10-10 DIAGNOSIS — I2510 Atherosclerotic heart disease of native coronary artery without angina pectoris: Secondary | ICD-10-CM | POA: Diagnosis not present

## 2019-10-10 DIAGNOSIS — J449 Chronic obstructive pulmonary disease, unspecified: Secondary | ICD-10-CM | POA: Diagnosis not present

## 2019-10-10 DIAGNOSIS — E7800 Pure hypercholesterolemia, unspecified: Secondary | ICD-10-CM | POA: Diagnosis not present

## 2019-10-10 DIAGNOSIS — I10 Essential (primary) hypertension: Secondary | ICD-10-CM | POA: Diagnosis not present

## 2019-10-18 DIAGNOSIS — I10 Essential (primary) hypertension: Secondary | ICD-10-CM | POA: Diagnosis not present

## 2019-10-18 DIAGNOSIS — M25511 Pain in right shoulder: Secondary | ICD-10-CM | POA: Diagnosis not present

## 2019-10-18 DIAGNOSIS — G8929 Other chronic pain: Secondary | ICD-10-CM | POA: Diagnosis not present

## 2019-10-18 DIAGNOSIS — J449 Chronic obstructive pulmonary disease, unspecified: Secondary | ICD-10-CM | POA: Diagnosis not present

## 2019-10-18 DIAGNOSIS — M25512 Pain in left shoulder: Secondary | ICD-10-CM | POA: Diagnosis not present

## 2019-10-25 IMAGING — DX DG KNEE COMPLETE 4+V*R*
4 series · 4 of 4 positions shown · non-contrast
Comparison: None.

CLINICAL DATA: Pain post fall.

EXAM:
RIGHT KNEE - COMPLETE 4+ VIEW

[knee ap]
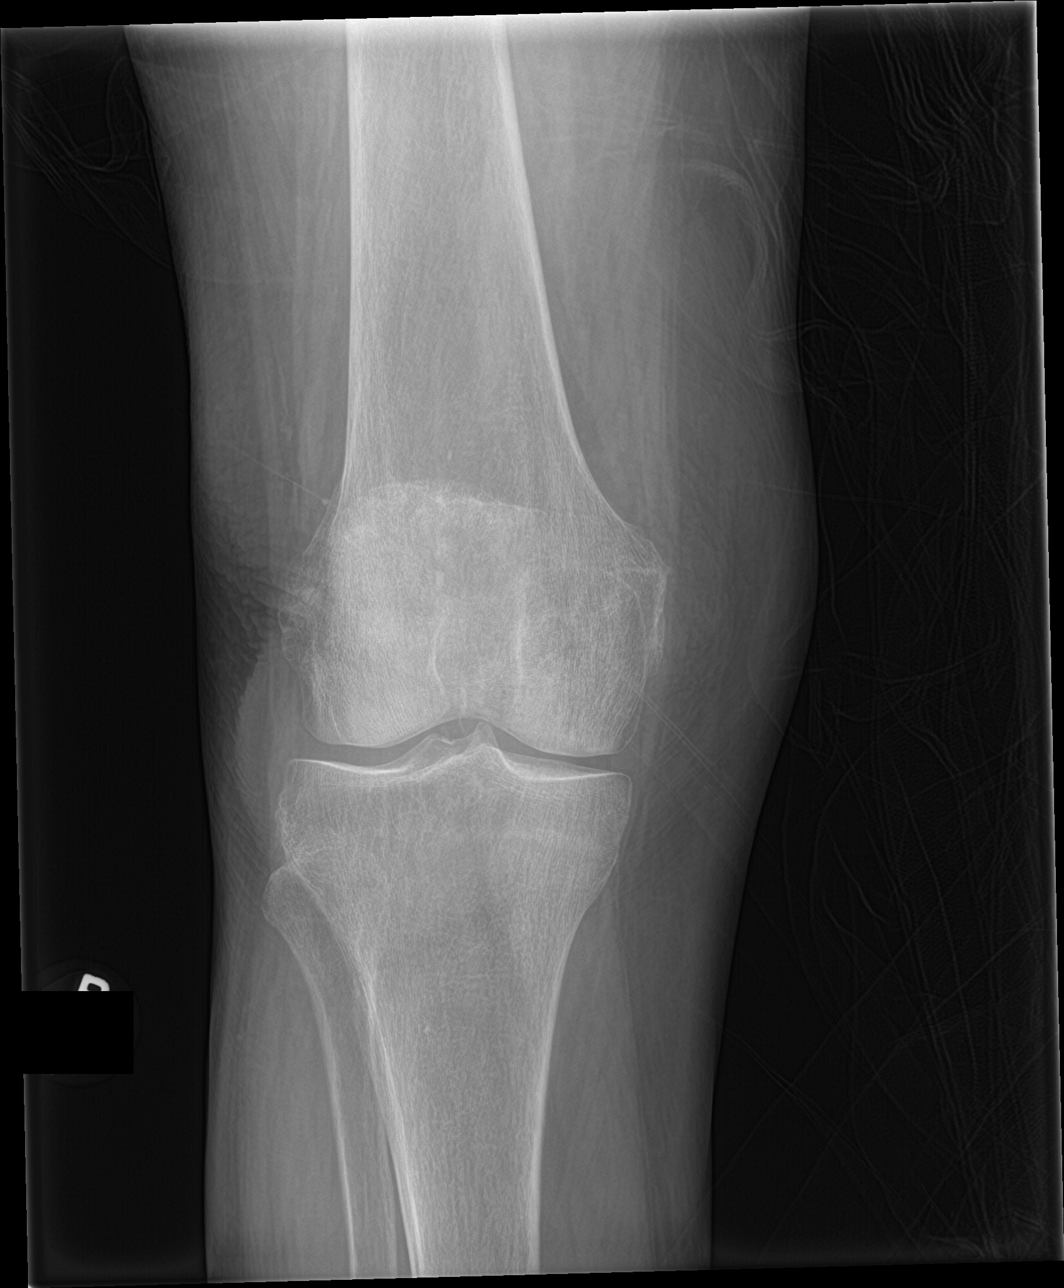

[knee lat]
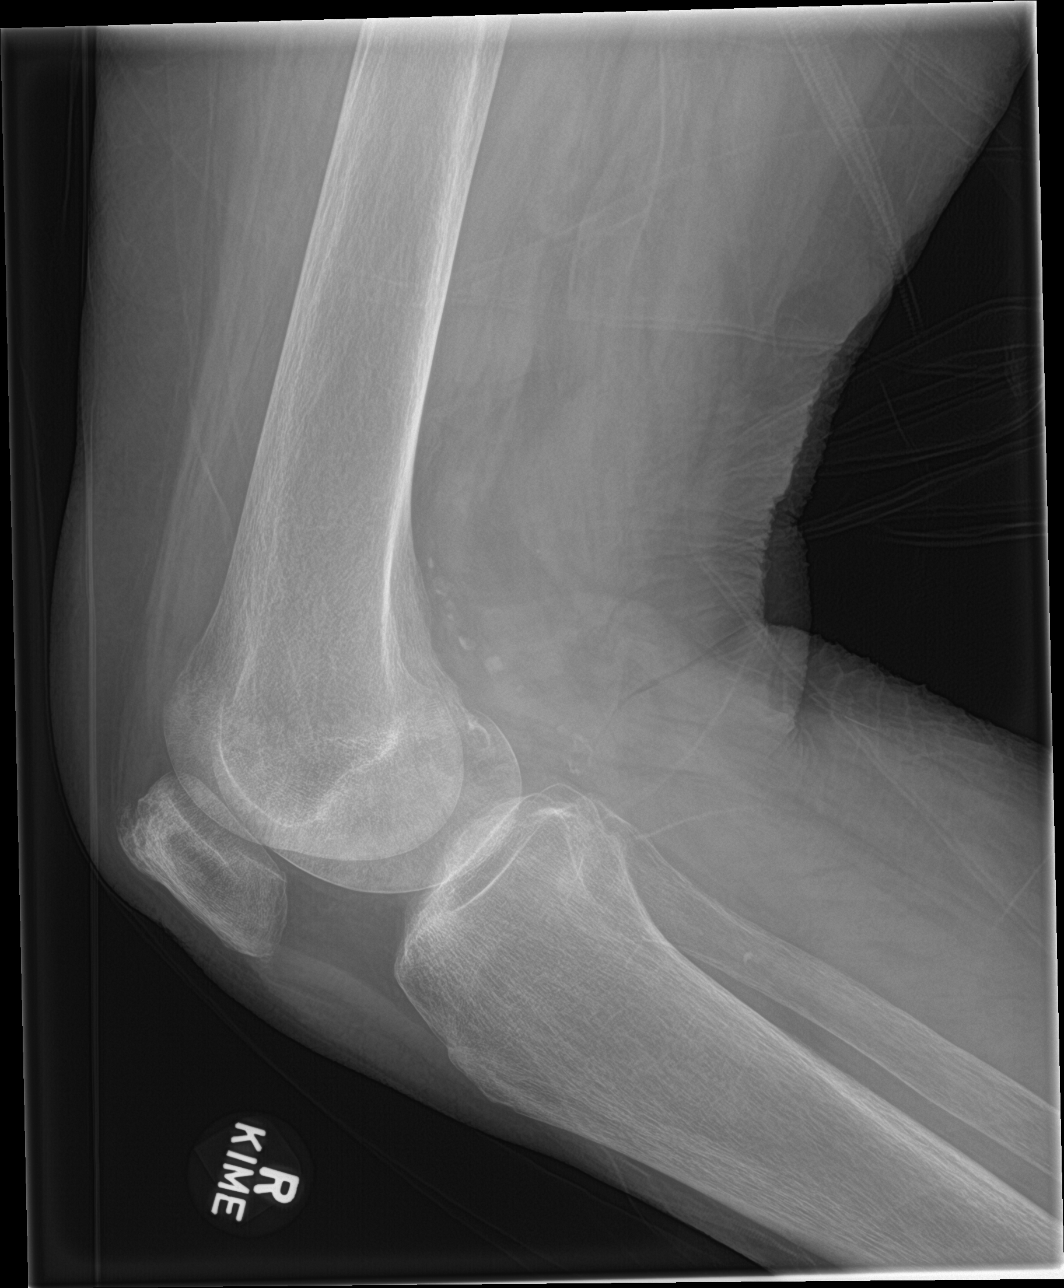

[knee obl (1 of 2)]
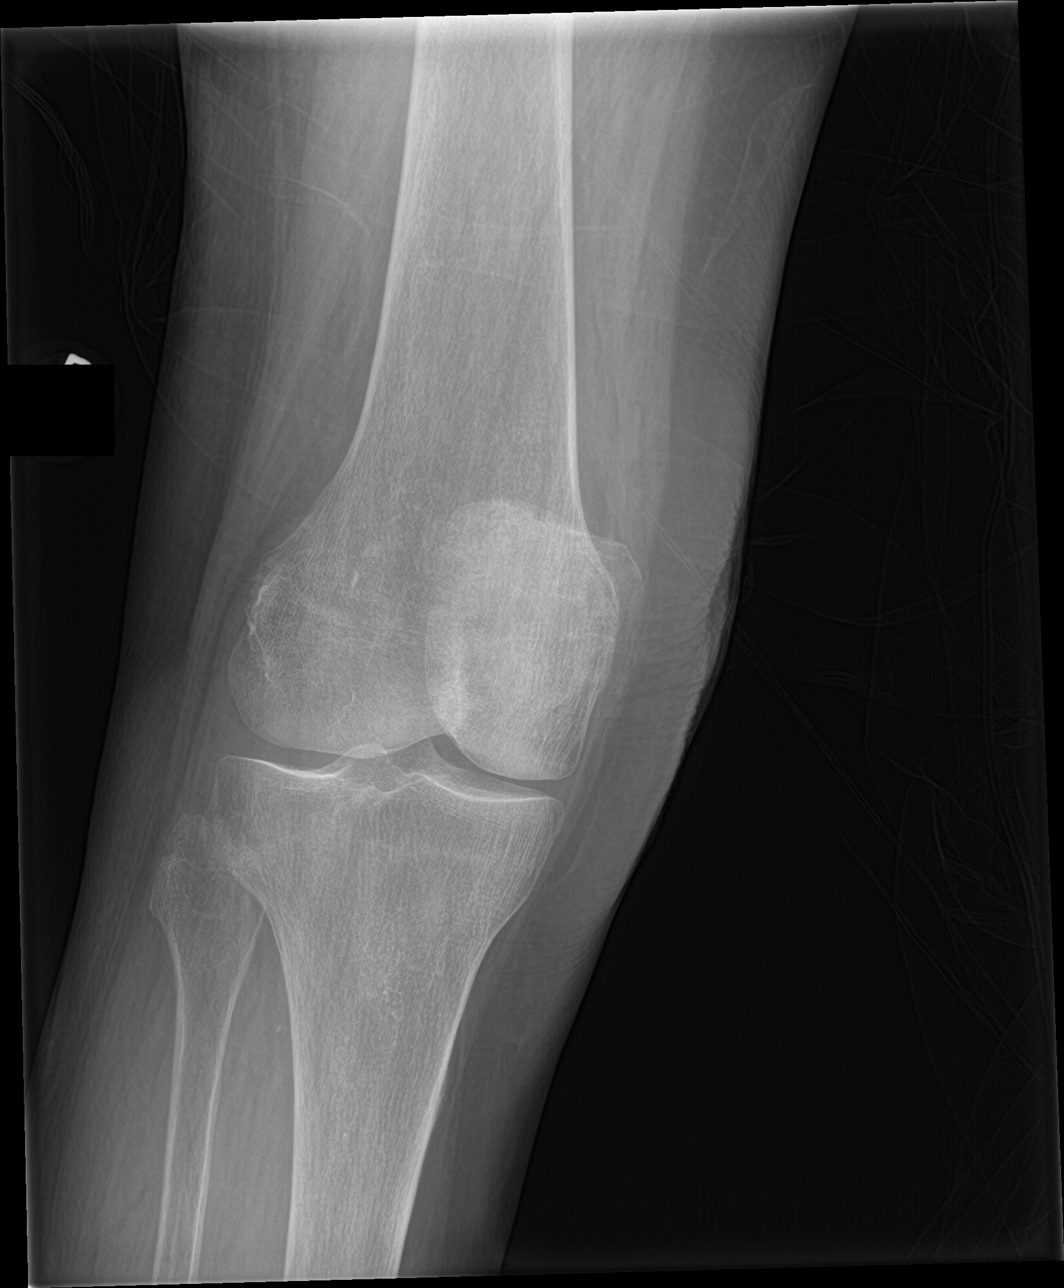

[knee obl (2 of 2)]
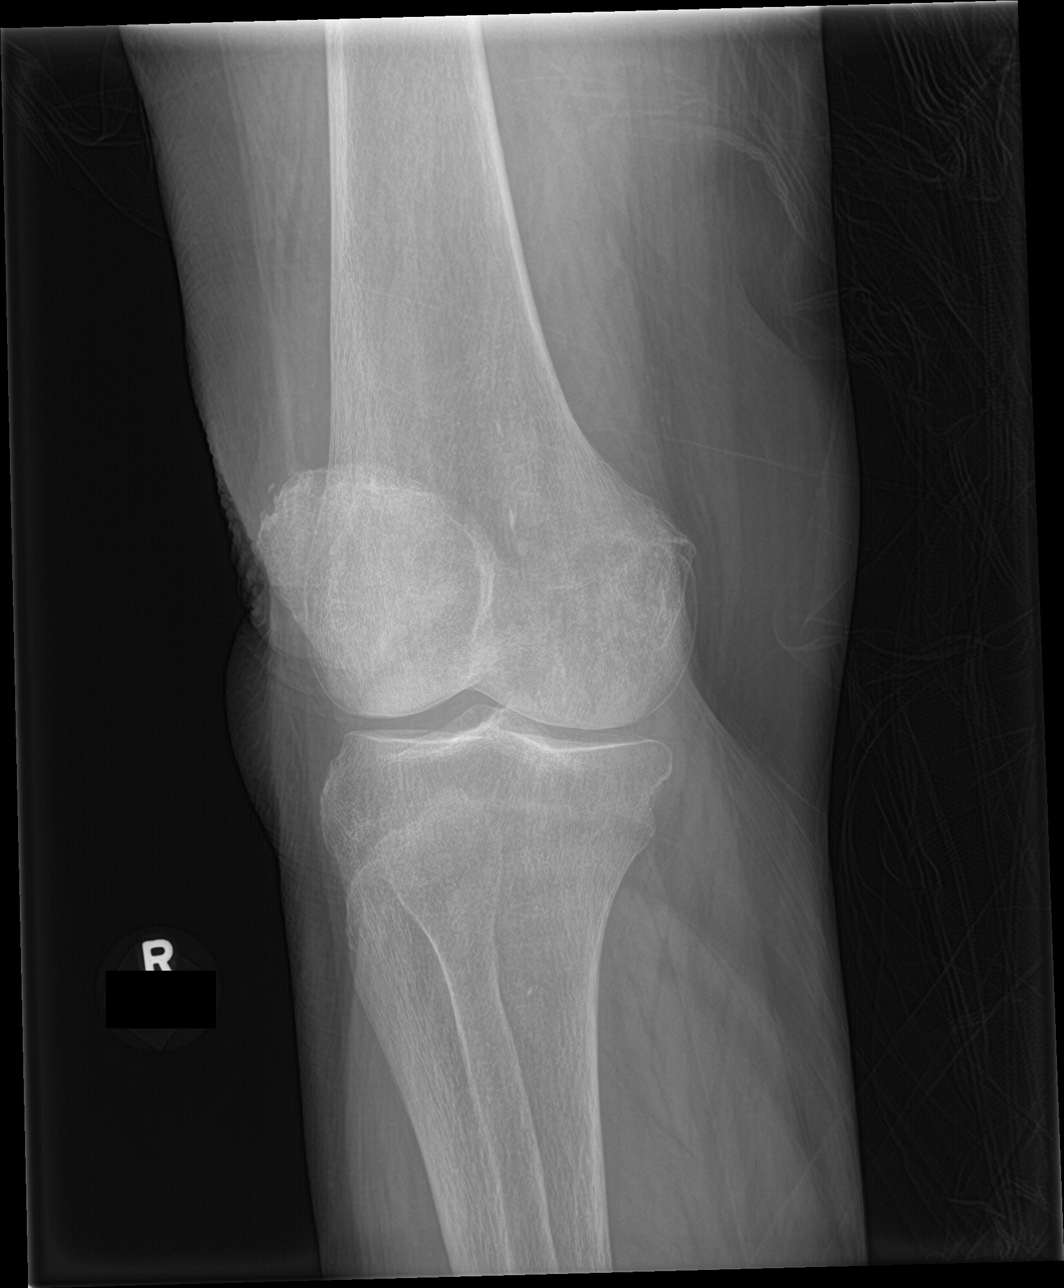

[4 of 4 positions shown; findings below may reference images not displayed]

FINDINGS: No evidence of fracture, dislocation, or joint effusion. No evidence
of arthropathy or other focal bone abnormality. Vascular
calcifications noted.
IMPRESSION: 1. No acute fracture or dislocation identified about the right knee.
2. Vascular calcifications.

## 2019-11-12 DIAGNOSIS — I2510 Atherosclerotic heart disease of native coronary artery without angina pectoris: Secondary | ICD-10-CM | POA: Diagnosis not present

## 2019-11-12 DIAGNOSIS — E7800 Pure hypercholesterolemia, unspecified: Secondary | ICD-10-CM | POA: Diagnosis not present

## 2019-11-12 DIAGNOSIS — M810 Age-related osteoporosis without current pathological fracture: Secondary | ICD-10-CM | POA: Diagnosis not present

## 2019-11-12 DIAGNOSIS — J449 Chronic obstructive pulmonary disease, unspecified: Secondary | ICD-10-CM | POA: Diagnosis not present

## 2019-11-12 DIAGNOSIS — I10 Essential (primary) hypertension: Secondary | ICD-10-CM | POA: Diagnosis not present

## 2019-11-29 DIAGNOSIS — J449 Chronic obstructive pulmonary disease, unspecified: Secondary | ICD-10-CM | POA: Diagnosis not present

## 2019-11-29 DIAGNOSIS — I872 Venous insufficiency (chronic) (peripheral): Secondary | ICD-10-CM | POA: Diagnosis not present

## 2019-11-29 DIAGNOSIS — I10 Essential (primary) hypertension: Secondary | ICD-10-CM | POA: Diagnosis not present

## 2019-11-29 DIAGNOSIS — M25511 Pain in right shoulder: Secondary | ICD-10-CM | POA: Diagnosis not present

## 2019-12-16 DIAGNOSIS — L570 Actinic keratosis: Secondary | ICD-10-CM | POA: Diagnosis not present

## 2019-12-16 DIAGNOSIS — D2239 Melanocytic nevi of other parts of face: Secondary | ICD-10-CM | POA: Diagnosis not present

## 2019-12-16 DIAGNOSIS — D2262 Melanocytic nevi of left upper limb, including shoulder: Secondary | ICD-10-CM | POA: Diagnosis not present

## 2019-12-16 DIAGNOSIS — L910 Hypertrophic scar: Secondary | ICD-10-CM | POA: Diagnosis not present

## 2019-12-16 DIAGNOSIS — L821 Other seborrheic keratosis: Secondary | ICD-10-CM | POA: Diagnosis not present

## 2019-12-16 DIAGNOSIS — D225 Melanocytic nevi of trunk: Secondary | ICD-10-CM | POA: Diagnosis not present

## 2020-01-24 ENCOUNTER — Ambulatory Visit
Admission: RE | Admit: 2020-01-24 | Discharge: 2020-01-24 | Disposition: A | Discharge status: Home / Self Care | Payer: Medicare Other | Source: Ambulatory Visit | Attending: Internal Medicine | Admitting: Internal Medicine

## 2020-01-24 ENCOUNTER — Other Ambulatory Visit: Payer: Self-pay | Admitting: Internal Medicine

## 2020-01-24 DIAGNOSIS — R1031 Right lower quadrant pain: Secondary | ICD-10-CM

## 2020-01-24 MED ORDER — IOPAMIDOL (ISOVUE-300) INJECTION 61%
100.0000 mL | Freq: Once | INTRAVENOUS | Status: AC | PRN
  Administered 2020-01-24: 12:00:00 100 mL via INTRAVENOUS

## 2020-02-13 ENCOUNTER — Emergency Department (HOSPITAL_BASED_OUTPATIENT_CLINIC_OR_DEPARTMENT_OTHER): Payer: Medicare Other

## 2020-02-13 ENCOUNTER — Encounter (HOSPITAL_BASED_OUTPATIENT_CLINIC_OR_DEPARTMENT_OTHER): Payer: Self-pay | Admitting: Emergency Medicine

## 2020-02-13 ENCOUNTER — Emergency Department (HOSPITAL_BASED_OUTPATIENT_CLINIC_OR_DEPARTMENT_OTHER)
Admission: EM | Admit: 2020-02-13 | Discharge: 2020-02-13 | Disposition: A | Discharge status: Home / Self Care | Payer: Medicare Other | Attending: Emergency Medicine | Admitting: Emergency Medicine

## 2020-02-13 DIAGNOSIS — Z87891 Personal history of nicotine dependence: Secondary | ICD-10-CM | POA: Insufficient documentation

## 2020-02-13 DIAGNOSIS — R0902 Hypoxemia: Secondary | ICD-10-CM | POA: Diagnosis not present

## 2020-02-13 DIAGNOSIS — I2510 Atherosclerotic heart disease of native coronary artery without angina pectoris: Secondary | ICD-10-CM | POA: Insufficient documentation

## 2020-02-13 DIAGNOSIS — R1031 Right lower quadrant pain: Secondary | ICD-10-CM | POA: Insufficient documentation

## 2020-02-13 DIAGNOSIS — K6289 Other specified diseases of anus and rectum: Secondary | ICD-10-CM

## 2020-02-13 DIAGNOSIS — R1084 Generalized abdominal pain: Secondary | ICD-10-CM | POA: Diagnosis not present

## 2020-02-13 DIAGNOSIS — K5900 Constipation, unspecified: Secondary | ICD-10-CM | POA: Diagnosis not present

## 2020-02-13 DIAGNOSIS — K429 Umbilical hernia without obstruction or gangrene: Secondary | ICD-10-CM | POA: Diagnosis not present

## 2020-02-13 DIAGNOSIS — R1030 Lower abdominal pain, unspecified: Secondary | ICD-10-CM | POA: Diagnosis not present

## 2020-02-13 DIAGNOSIS — Z79899 Other long term (current) drug therapy: Secondary | ICD-10-CM | POA: Insufficient documentation

## 2020-02-13 DIAGNOSIS — R1032 Left lower quadrant pain: Secondary | ICD-10-CM | POA: Diagnosis not present

## 2020-02-13 DIAGNOSIS — R55 Syncope and collapse: Secondary | ICD-10-CM | POA: Insufficient documentation

## 2020-02-13 DIAGNOSIS — N3289 Other specified disorders of bladder: Secondary | ICD-10-CM | POA: Diagnosis not present

## 2020-02-13 DIAGNOSIS — I10 Essential (primary) hypertension: Secondary | ICD-10-CM | POA: Diagnosis not present

## 2020-02-13 DIAGNOSIS — R109 Unspecified abdominal pain: Secondary | ICD-10-CM | POA: Diagnosis present

## 2020-02-13 DIAGNOSIS — K5730 Diverticulosis of large intestine without perforation or abscess without bleeding: Secondary | ICD-10-CM | POA: Diagnosis not present

## 2020-02-13 DIAGNOSIS — K5120 Ulcerative (chronic) proctitis without complications: Secondary | ICD-10-CM | POA: Insufficient documentation

## 2020-02-13 DIAGNOSIS — I4510 Unspecified right bundle-branch block: Secondary | ICD-10-CM | POA: Diagnosis not present

## 2020-02-13 LAB — COMPREHENSIVE METABOLIC PANEL
ALT: 22 U/L (ref 0–44)
AST: 21 U/L (ref 15–41)
Albumin: 4.3 g/dL (ref 3.5–5.0)
Alkaline Phosphatase: 41 U/L (ref 38–126)
Anion gap: 12 (ref 5–15)
BUN: 12 mg/dL (ref 8–23)
CO2: 29 mmol/L (ref 22–32)
Calcium: 9.6 mg/dL (ref 8.9–10.3)
Chloride: 99 mmol/L (ref 98–111)
Creatinine, Ser: 0.88 mg/dL (ref 0.44–1.00)
GFR calc Af Amer: >60 mL/min (ref >60)
GFR calc non Af Amer: >60 mL/min (ref >60)
Glucose, Bld: 116 mg/dL — ABNORMAL HIGH (ref 70–99)
Potassium: 4.2 mmol/L (ref 3.5–5.1)
Sodium: 140 mmol/L (ref 135–145)
Total Bilirubin: 0.8 mg/dL (ref 0.3–1.2)
Total Protein: 7.2 g/dL (ref 6.5–8.1)

## 2020-02-13 LAB — URINALYSIS, ROUTINE W REFLEX MICROSCOPIC
Bilirubin Urine: NEGATIVE
Glucose, UA: NEGATIVE mg/dL
Ketones, ur: NEGATIVE mg/dL
Nitrite: NEGATIVE
Protein, ur: NEGATIVE mg/dL
Specific Gravity, Urine: 1.02 (ref 1.005–1.030)
pH: 7 (ref 5.0–8.0)

## 2020-02-13 LAB — CBC WITH DIFFERENTIAL/PLATELET
Abs Immature Granulocytes: 0.08 10*3/uL — ABNORMAL HIGH (ref 0.00–0.07)
Basophils Absolute: 0 10*3/uL (ref 0.0–0.1)
Basophils Relative: 0 %
Eosinophils Absolute: 0 10*3/uL (ref 0.0–0.5)
Eosinophils Relative: 0 %
HCT: 44.2 % (ref 36.0–46.0)
Hemoglobin: 14.6 g/dL (ref 12.0–15.0)
Immature Granulocytes: 1 %
Lymphocytes Relative: 6 %
Lymphs Abs: 0.9 10*3/uL (ref 0.7–4.0)
MCH: 31 pg (ref 26.0–34.0)
MCHC: 33 g/dL (ref 30.0–36.0)
MCV: 93.8 fL (ref 80.0–100.0)
Monocytes Absolute: 0.8 10*3/uL (ref 0.1–1.0)
Monocytes Relative: 5 %
Neutro Abs: 14 10*3/uL — ABNORMAL HIGH (ref 1.7–7.7)
Neutrophils Relative %: 88 %
Platelets: 212 10*3/uL (ref 150–400)
RBC: 4.71 MIL/uL (ref 3.87–5.11)
RDW: 13.2 % (ref 11.5–15.5)
WBC: 15.8 10*3/uL — ABNORMAL HIGH (ref 4.0–10.5)
nRBC: 0 % (ref 0.0–0.2)

## 2020-02-13 LAB — URINALYSIS, MICROSCOPIC (REFLEX)

## 2020-02-13 LAB — LIPASE, BLOOD: Lipase: 26 U/L (ref 11–51)

## 2020-02-13 MED ORDER — SODIUM CHLORIDE 0.9 % IV BOLUS
1000.0000 mL | Freq: Once | INTRAVENOUS | Status: AC
  Administered 2020-02-13: 1000 mL via INTRAVENOUS

## 2020-02-13 MED ORDER — ONDANSETRON HCL 4 MG/2ML IJ SOLN
4.0000 mg | Freq: Once | INTRAMUSCULAR | Status: AC
  Administered 2020-02-13: 4 mg via INTRAVENOUS
  Filled 2020-02-13: qty 2

## 2020-02-13 MED ORDER — NYSTATIN 100000 UNIT/GM EX CREA
TOPICAL_CREAM | CUTANEOUS | 0 refills | Status: DC
Start: 2020-02-13 — End: 2022-05-04

## 2020-02-13 MED ORDER — FLUCONAZOLE 150 MG PO TABS
150.0000 mg | ORAL_TABLET | Freq: Once | ORAL | Status: AC
  Administered 2020-02-13: 150 mg via ORAL
  Filled 2020-02-13: qty 1

## 2020-02-13 MED ORDER — IOHEXOL 300 MG/ML  SOLN
100.0000 mL | Freq: Once | INTRAMUSCULAR | Status: AC | PRN
  Administered 2020-02-13: 100 mL via INTRAVENOUS

## 2020-02-13 MED ORDER — ONDANSETRON 4 MG PO TBDP
ORAL_TABLET | ORAL | 0 refills | Status: DC
Start: 2020-02-13 — End: 2022-05-04

## 2020-02-13 MED ORDER — MORPHINE SULFATE (PF) 4 MG/ML IV SOLN
4.0000 mg | Freq: Once | INTRAVENOUS | Status: AC
  Administered 2020-02-13: 4 mg via INTRAVENOUS
  Filled 2020-02-13: qty 1

## 2020-02-13 MED ORDER — AMOXICILLIN-POT CLAVULANATE 875-125 MG PO TABS: 1.0000 | ORAL_TABLET | Freq: Two times a day (BID) | ORAL | 0 refills | Status: DC

## 2020-02-13 MED ORDER — MORPHINE SULFATE 15 MG PO TABS: 7.5000 mg | ORAL_TABLET | ORAL | 0 refills | Status: DC | PRN

## 2020-02-13 NOTE — ED Notes (Signed)
Patient transported to CT 

## 2020-02-13 NOTE — ED Provider Notes (Signed)
Kim Sutton Provider Note   CSN: [id number] Arrival date & time: 02/13/20  1502     History Chief Complaint  Patient presents with  . Abdominal Pain    Kim Sutton is a 86 y.o. female.  86 yo F with a chief complaints of abdominal pain.  This is worse in the right lower area.  Going on for a few weeks now.  I been seen by her family doctor and had a CAT scan that was negative for acute intra-abdominal pathology.  Worsened over the past 48 hours or so.  Still able to eat and drink without issue.  Patient states that she is not constipated but she does have some trouble moving her bowels.  Typically spends 45 minutes to an hour on the toilet at a time.  Has been having a feeling like she cannot urinate.  Denies any low back pain.  Denies fevers.  Denies trauma.  She has been treated with antibiotics off and on for this by her family doctor.  Presumed to be urinary tract infection.  The patient also had event earlier today where she felt the pain was so severe that she stopped responding to her husband.  Lasted for couple seconds and then resolved.  Now back to her baseline.  She has been having chronic pain to both of her shoulders.  Worse on the left now than it has been previously.  Denies chest pain denies shortness of breath.  The history is provided by the patient and the spouse.  Abdominal Pain Pain location:  RLQ, LLQ and suprapubic Pain quality: aching and cramping   Pain radiates to:  Does not radiate Pain severity:  Moderate Onset quality:  Gradual Duration:  4 weeks Timing:  Intermittent Progression:  Waxing and waning Chronicity:  New Relieved by:  Nothing Worsened by:  Nothing Ineffective treatments:  None tried Associated symptoms: constipation   Associated symptoms: no chest pain, no chills, no diarrhea, no dysuria, no fever, no nausea, no shortness of breath and no vomiting        Past Medical History:  Diagnosis Date  .  Adrenal cortical adenoma of left adrenal gland    stable since 2009;  MRI in 04/2010 - no further scans  . CAD (coronary artery disease)    a. s/p NSTEMI in 2001 tx with BMS to RCA  . Colon polyps   . HLD (hyperlipidemia)   . Hx of cardiovascular stress test    Lexiscan Myoview (09/2013):  No ischemia, EF 77%, Low Risk.  Marland Kitchen Hyperglycemia   . Hypertension   . Myoclonus    on neurontin per neuro at [facility]  . Nephrolithiasis   . Nodule of right lung    a. 62mm on CXR 10/2009;  CT 05/2010 ok - no further scans  . Tobacco abuse     Patient Active Problem List   Diagnosis Date Noted  . CAD (coronary artery disease) 09/04/2013  . HTN (hypertension) 09/04/2013  . HLD (hyperlipidemia) 09/04/2013  . Tobacco abuse 09/04/2013    Past Surgical History:  Procedure Laterality Date  . CHOLECYSTECTOMY    . CORONARY ANGIOPLASTY WITH STENT PLACEMENT    . TOTAL ABDOMINAL HYSTERECTOMY       OB History   No obstetric history on file.     No family history on file.  Social History   Tobacco Use  . Smoking status: Former Research scientist (life sciences)  . Smokeless tobacco: Never Used  Substance Use Topics  .  Alcohol use: No  . Drug use: Not on file    Home Medications Prior to Admission medications   Medication Sig Start Date End Date Taking? Authorizing Provider  amLODipine (NORVASC) 5 MG tablet  07/22/13  Yes [provider]  aspirin 81 MG tablet Take 324 mg by mouth daily.    Yes [provider]  lisinopril-hydrochlorothiazide (PRINZIDE,ZESTORETIC) 20-12.5 MG per tablet  07/22/13  Yes [provider]  metoprolol succinate (TOPROL-XL) 50 MG 24 hr tablet Take 50 mg by mouth daily. Take with or immediately following a meal.   Yes [provider]  simvastatin (ZOCOR) 20 MG tablet Take 20 mg by mouth daily.   Yes [provider]  ZETIA 10 MG tablet  07/22/13  Yes [provider]  amoxicillin-clavulanate (AUGMENTIN) 875-125 MG tablet Take 1 tablet by mouth every  12 (twelve) hours. 02/13/20   Deno Etienne, DO  bacitracin ointment Apply 1 application topically 2 (two) times daily. 07/13/19   Tacy Learn, PA-C  morphine (MSIR) 15 MG tablet Take 0.5 tablets (7.5 mg total) by mouth every 4 (four) hours as needed for severe pain. 02/13/20   Deno Etienne, DO  nystatin cream (MYCOSTATIN) Apply to affected area 2 times daily 02/13/20   Deno Etienne, DO  ondansetron (ZOFRAN ODT) 4 MG disintegrating tablet 4mg  ODT q4 hours prn nausea/vomit 02/13/20   Deno Etienne, DO  vitamin B-12 (CYANOCOBALAMIN) 1000 MCG tablet Take by mouth.    [provider]    Allergies    Ceclor [cefaclor]  Review of Systems   Review of Systems  Constitutional: Negative for chills and fever.  HENT: Negative for congestion and rhinorrhea.   Eyes: Negative for redness and visual disturbance.  Respiratory: Negative for shortness of breath and wheezing.   Cardiovascular: Negative for chest pain and palpitations.  Gastrointestinal: Positive for abdominal pain and constipation. Negative for diarrhea, nausea and vomiting.  Genitourinary: Negative for dysuria and urgency.  Musculoskeletal: Negative for arthralgias and myalgias.  Skin: Negative for pallor and wound.  Neurological: Positive for syncope. Negative for dizziness and headaches.    Physical Exam Updated Vital Signs BP 139/71   Pulse 76   Temp 98.4 F (36.9 C) (Oral)   Resp (!) 28   Ht 5\' 6"  (1.676 m)   Wt 77.2 kg   SpO2 96%   BMI 27.47 kg/m   Physical Exam Vitals and nursing note reviewed.  Constitutional:      General: She is not in acute distress.    Appearance: She is well-developed. She is not diaphoretic.  HENT:     Head: Normocephalic and atraumatic.  Eyes:     Pupils: Pupils are equal, round, and reactive to light.  Cardiovascular:     Rate and Rhythm: Normal rate and regular rhythm.     Heart sounds: No murmur heard.  No friction rub. No gallop.   Pulmonary:     Effort: Pulmonary effort is normal.       Breath sounds: No wheezing or rales.  Abdominal:     General: There is no distension.     Palpations: Abdomen is soft.     Tenderness: There is no abdominal tenderness.  Genitourinary:    Comments: Erythema with satellite lesions around the rectum and the vaginal area.  Bladder is not visualized.  No hemorrhoids.  No bleeding. Musculoskeletal:        General: No tenderness.     Cervical back: Normal range of motion and neck supple.  Skin:    General: Skin is warm and dry.  Neurological:     Mental Status: She is alert and oriented to person, place, and time.  Psychiatric:        Behavior: Behavior normal.     ED Results / Procedures / Treatments   Labs (all labs ordered are listed, but only abnormal results are displayed) Labs Reviewed  CBC WITH DIFFERENTIAL/PLATELET - Abnormal; Notable for the following components:      Result Value   WBC 15.8 (*)    Neutro Abs 14.0 (*)    Abs Immature Granulocytes 0.08 (*)    All other components within normal limits  COMPREHENSIVE METABOLIC PANEL - Abnormal; Notable for the following components:   Glucose, Bld 116 (*)    All other components within normal limits  URINALYSIS, ROUTINE W REFLEX MICROSCOPIC - Abnormal; Notable for the following components:   Hgb urine dipstick TRACE (*)    Leukocytes,Ua TRACE (*)    All other components within normal limits  URINALYSIS, MICROSCOPIC (REFLEX) - Abnormal; Notable for the following components:   Bacteria, UA RARE (*)    All other components within normal limits  LIPASE, BLOOD    EKG EKG Interpretation  Date/Time:  Thursday February 13 2020 15:42:58 EDT Ventricular Rate:  67 PR Interval:    QRS Duration: 136 QT Interval:  413 QTC Calculation: 436 R Axis:   38 Text Interpretation: Sinus rhythm Probable left atrial enlargement Right bundle branch block No significant change since last tracing Confirmed by Deno Etienne [phone]) on 02/13/2020 3:45:47 PM   Radiology No results  found.  Procedures Procedures (including critical care time)  Medications Ordered in ED Medications  sodium chloride 0.9 % bolus 1,000 mL (0 mLs Intravenous Stopped 02/13/20 1730)  morphine 4 MG/ML injection 4 mg (4 mg Intravenous Given 02/13/20 1624)  ondansetron (ZOFRAN) injection 4 mg (4 mg Intravenous Given 02/13/20 1623)  fluconazole (DIFLUCAN) tablet 150 mg (150 mg Oral Given 02/13/20 1552)  iohexol (OMNIPAQUE) 300 MG/ML solution 100 mL (100 mLs Intravenous Contrast Given 02/13/20 1720)    ED Course  I have reviewed the triage vital signs and the nursing notes.  Pertinent labs & imaging results that were available during my care of the patient were reviewed by me and considered in my medical decision making (see chart for details).    MDM Rules/Calculators/A&P                          86 yo F with a chief complaints of lower colicky abdominal pain and constipation.  Going on for about a month now off and on.  Worsening over the past couple days.  Having some abdominal pain and nausea now.  Diffusely tender on abdominal exam.  Will obtain a CT scan.  I reviewed the patient's record she had a CT scan done about 3 weeks ago that was negative.  There is some concern reported to the patient about prolapsed bladder, on GU exam it seems more likely the patient has candidiasis.  We will give a dose of Diflucan here.  Prescribed nystatin.  CT scan with proctitis.  Will start on antibiotics.  Follow-up with PCP and GI.  7:27 PM:  I have discussed the diagnosis/risks/treatment options with the patient and family and believe the pt to be eligible for discharge home to follow-up with PCP, GI. We also discussed returning to the ED immediately if new or worsening sx occur. We discussed  the sx which are most concerning (e.g., sudden worsening pain, fever, inability to tolerate by mouth) that necessitate immediate return. Medications administered to the patient during their visit and any new prescriptions  provided to the patient are listed below.  Medications given during this visit Medications  sodium chloride 0.9 % bolus 1,000 mL (0 mLs Intravenous Stopped 02/13/20 1730)  morphine 4 MG/ML injection 4 mg (4 mg Intravenous Given 02/13/20 1624)  ondansetron (ZOFRAN) injection 4 mg (4 mg Intravenous Given 02/13/20 1623)  fluconazole (DIFLUCAN) tablet 150 mg (150 mg Oral Given 02/13/20 1552)  iohexol (OMNIPAQUE) 300 MG/ML solution 100 mL (100 mLs Intravenous Contrast Given 02/13/20 1720)     The patient appears reasonably screen and/or stabilized for discharge and I doubt any other medical condition or other [facility] requiring further screening, evaluation, or treatment in the ED at this time prior to discharge.   Final Clinical Impression(s) / ED Diagnoses Final diagnoses:  Proctitis    Rx / DC Orders ED Discharge Orders         Ordered    amoxicillin-clavulanate (AUGMENTIN) 875-125 MG tablet  Every 12 hours     Discontinue  Reprint     02/13/20 1856    nystatin cream (MYCOSTATIN)     Discontinue  Reprint     02/13/20 1856    morphine (MSIR) 15 MG tablet  Every 4 hours PRN     Discontinue  Reprint     02/13/20 1901    ondansetron (ZOFRAN ODT) 4 MG disintegrating tablet     Discontinue  Reprint     02/13/20 1901           Deno Etienne, DO 02/13/20 1927

## 2020-02-13 NOTE — Discharge Instructions (Addendum)
Take tylenol 1000mg(2 extra strength) four times a day.  ° °Then take the pain medicine if you feel like you need it. Narcotics do not help with the pain, they only make you care about it less.  You can become addicted to this, people may break into your house to steal it.  It will constipate you.  If you drive under the influence of this medicine you can get a DUI.   ° °

## 2020-02-13 NOTE — ED Triage Notes (Signed)
Pt arrives via EMS from home with c/o abd pain, rectal pain and urinary retention. She was seen by PCP this week.

## 2020-02-13 NOTE — ED Notes (Signed)
Bladder scan 362ml

## 2020-02-13 NOTE — ED Notes (Signed)
Placed on 3 l/m [place] after pain meds, desat into mid 80s.

## 2020-02-14 DIAGNOSIS — M7541 Impingement syndrome of right shoulder: Secondary | ICD-10-CM | POA: Diagnosis not present

## 2020-02-14 DIAGNOSIS — M25511 Pain in right shoulder: Secondary | ICD-10-CM | POA: Diagnosis not present

## 2020-02-20 DIAGNOSIS — K6289 Other specified diseases of anus and rectum: Secondary | ICD-10-CM | POA: Diagnosis not present

## 2020-02-20 DIAGNOSIS — I10 Essential (primary) hypertension: Secondary | ICD-10-CM | POA: Diagnosis not present

## 2020-02-20 DIAGNOSIS — Z0000 Encounter for general adult medical examination without abnormal findings: Secondary | ICD-10-CM | POA: Diagnosis not present

## 2020-02-24 DIAGNOSIS — R9389 Abnormal findings on diagnostic imaging of other specified body structures: Secondary | ICD-10-CM | POA: Diagnosis not present

## 2020-02-24 DIAGNOSIS — R1030 Lower abdominal pain, unspecified: Secondary | ICD-10-CM | POA: Diagnosis not present

## 2020-02-24 DIAGNOSIS — K5900 Constipation, unspecified: Secondary | ICD-10-CM | POA: Diagnosis not present

## 2020-03-19 DIAGNOSIS — K5900 Constipation, unspecified: Secondary | ICD-10-CM | POA: Diagnosis not present

## 2020-03-19 DIAGNOSIS — R933 Abnormal findings on diagnostic imaging of other parts of digestive tract: Secondary | ICD-10-CM | POA: Diagnosis not present

## 2020-03-19 DIAGNOSIS — R1030 Lower abdominal pain, unspecified: Secondary | ICD-10-CM | POA: Diagnosis not present

## 2020-03-19 DIAGNOSIS — K5730 Diverticulosis of large intestine without perforation or abscess without bleeding: Secondary | ICD-10-CM | POA: Diagnosis not present

## 2020-03-19 DIAGNOSIS — Z86010 Personal history of colonic polyps: Secondary | ICD-10-CM | POA: Diagnosis not present

## 2020-03-31 DIAGNOSIS — E7800 Pure hypercholesterolemia, unspecified: Secondary | ICD-10-CM | POA: Diagnosis not present

## 2020-03-31 DIAGNOSIS — J449 Chronic obstructive pulmonary disease, unspecified: Secondary | ICD-10-CM | POA: Diagnosis not present

## 2020-03-31 DIAGNOSIS — I10 Essential (primary) hypertension: Secondary | ICD-10-CM | POA: Diagnosis not present

## 2020-03-31 DIAGNOSIS — I2510 Atherosclerotic heart disease of native coronary artery without angina pectoris: Secondary | ICD-10-CM | POA: Diagnosis not present

## 2020-03-31 DIAGNOSIS — M810 Age-related osteoporosis without current pathological fracture: Secondary | ICD-10-CM | POA: Diagnosis not present

## 2020-06-03 DIAGNOSIS — Z79899 Other long term (current) drug therapy: Secondary | ICD-10-CM | POA: Diagnosis not present

## 2020-06-03 DIAGNOSIS — J449 Chronic obstructive pulmonary disease, unspecified: Secondary | ICD-10-CM | POA: Diagnosis not present

## 2020-06-03 DIAGNOSIS — M4692 Unspecified inflammatory spondylopathy, cervical region: Secondary | ICD-10-CM | POA: Diagnosis not present

## 2020-06-03 DIAGNOSIS — R269 Unspecified abnormalities of gait and mobility: Secondary | ICD-10-CM | POA: Diagnosis not present

## 2020-06-03 DIAGNOSIS — Z0000 Encounter for general adult medical examination without abnormal findings: Secondary | ICD-10-CM | POA: Diagnosis not present

## 2020-06-03 DIAGNOSIS — K219 Gastro-esophageal reflux disease without esophagitis: Secondary | ICD-10-CM | POA: Diagnosis not present

## 2020-06-03 DIAGNOSIS — M25532 Pain in left wrist: Secondary | ICD-10-CM | POA: Diagnosis not present

## 2020-06-03 DIAGNOSIS — Z1389 Encounter for screening for other disorder: Secondary | ICD-10-CM | POA: Diagnosis not present

## 2020-06-03 DIAGNOSIS — I10 Essential (primary) hypertension: Secondary | ICD-10-CM | POA: Diagnosis not present

## 2020-06-03 DIAGNOSIS — Z23 Encounter for immunization: Secondary | ICD-10-CM | POA: Diagnosis not present

## 2020-06-09 DIAGNOSIS — M810 Age-related osteoporosis without current pathological fracture: Secondary | ICD-10-CM | POA: Diagnosis not present

## 2020-06-09 DIAGNOSIS — I2510 Atherosclerotic heart disease of native coronary artery without angina pectoris: Secondary | ICD-10-CM | POA: Diagnosis not present

## 2020-06-09 DIAGNOSIS — E7800 Pure hypercholesterolemia, unspecified: Secondary | ICD-10-CM | POA: Diagnosis not present

## 2020-06-09 DIAGNOSIS — G8929 Other chronic pain: Secondary | ICD-10-CM | POA: Diagnosis not present

## 2020-06-09 DIAGNOSIS — K219 Gastro-esophageal reflux disease without esophagitis: Secondary | ICD-10-CM | POA: Diagnosis not present

## 2020-06-09 DIAGNOSIS — I10 Essential (primary) hypertension: Secondary | ICD-10-CM | POA: Diagnosis not present

## 2020-06-09 DIAGNOSIS — J449 Chronic obstructive pulmonary disease, unspecified: Secondary | ICD-10-CM | POA: Diagnosis not present

## 2020-06-11 DIAGNOSIS — M79645 Pain in left finger(s): Secondary | ICD-10-CM | POA: Diagnosis not present

## 2020-06-11 DIAGNOSIS — M6740 Ganglion, unspecified site: Secondary | ICD-10-CM | POA: Diagnosis not present

## 2020-06-11 DIAGNOSIS — M19032 Primary osteoarthritis, left wrist: Secondary | ICD-10-CM | POA: Diagnosis not present

## 2020-06-22 ENCOUNTER — Ambulatory Visit: Payer: Medicare Other | Attending: Geriatric Medicine

## 2020-06-22 ENCOUNTER — Other Ambulatory Visit: Payer: Self-pay

## 2020-06-22 DIAGNOSIS — R2681 Unsteadiness on feet: Secondary | ICD-10-CM | POA: Insufficient documentation

## 2020-06-22 DIAGNOSIS — R278 Other lack of coordination: Secondary | ICD-10-CM | POA: Diagnosis not present

## 2020-06-22 DIAGNOSIS — R2689 Other abnormalities of gait and mobility: Secondary | ICD-10-CM

## 2020-06-22 DIAGNOSIS — M6281 Muscle weakness (generalized): Secondary | ICD-10-CM | POA: Diagnosis not present

## 2020-06-22 NOTE — Therapy (Signed)
[place], [place], [address] Phone: [phone]   Fax:  [phone]  Physical Therapy Evaluation  Patient Details  Name: Kim Sutton MRN: [id number] Date of Birth: [birth year] Referring Provider (PT): Lajean Manes, MD    Encounter Date: 06/22/2020   PT End of Session - 06/22/20 0539    Visit Number 1    Number of Visits 17    Date for PT Re-Evaluation 08/22/20    Authorization Type Medicare - FOTO at visit 6 and visit 10; PN visit 10, KX modifier visit 15    PT Start Time 0830    PT Stop Time 0915    PT Time Calculation (min) 45 min    Activity Tolerance Patient tolerated treatment well    Behavior During Therapy [facility] for tasks assessed/performed           Past Medical History:  Diagnosis Date  . Adrenal cortical adenoma of left adrenal gland    stable since 2009;  MRI in 04/2010 - no further scans  . CAD (coronary artery disease)    a. s/p NSTEMI in 2001 tx with BMS to RCA  . Colon polyps   . HLD (hyperlipidemia)   . Hx of cardiovascular stress test    Lexiscan Myoview (09/2013):  No ischemia, EF 77%, Low Risk.  Marland Kitchen Hyperglycemia   . Hypertension   . Myoclonus    on neurontin per neuro at [facility]  . Nephrolithiasis   . Nodule of right lung    a. 16mm on CXR 10/2009;  CT 05/2010 ok - no further scans  . Tobacco abuse     Past Surgical History:  Procedure Laterality Date  . CHOLECYSTECTOMY    . CORONARY ANGIOPLASTY WITH STENT PLACEMENT    . TOTAL ABDOMINAL HYSTERECTOMY      There were no vitals filed for this visit.    Subjective Assessment - 06/22/20 0819    Subjective "I have trouble moving this side of my body (right). My hands hurt and my legs hurt all the time." Husband reports "she has transverse myelitis which primarily effects her right side." Pt reports being significantly hard of hearing, but she hears a little better on her R. Pt reports making progress during PT in 2019 for  gait abnormalities but has not been compliant with HEP and did not wear AFO and/or heel lift outside of previous PT sessions.    Patient is accompained by: Family member   husband - [name]   Pertinent History CAD, HTN, HLD. (In chart from previous POC 2019: h/o transverse myelitis;  (husband reports Rt sided weakness is due to pt having had lesion on spine due to shingles approx. 3 yrs ago)    Limitations Standing;Walking;Sitting;House hold activities;Lifting    How long can you sit comfortably? "pain in lower legs when sitting for a while; top part of legs (thighs) hurt when sleeping"    How long can you stand comfortably? 10-15 minutes before legs get tired    How long can you walk comfortably? 10-15 minutes before legs get tired    Patient Stated Goals "Just walk around without pain"    Currently in Pain? Yes    Pain Score 8     Pain Location Leg    Pain Orientation Right;Left    Pain Descriptors / Indicators Aching;Sharp;Cramping   aching then sharp pains during cramps   Pain Type Chronic pain    Pain Onset More than a month ago  Pain Frequency Constant    Aggravating Factors  Activity leads to fatigue - "it comes and goes"    Pain Relieving Factors Topical cream, tylenol    Effect of Pain on Daily Activities Decreased activity due to fatigue and pain              [facility] PT Assessment - 06/22/20 0001      Assessment   Medical Diagnosis Unspecified abnormalities of gait and mobility R26.9    Referring Provider (PT) Lajean Manes, MD     Onset Date/Surgical Date --   "~6-7 years ago"   Hand Dominance Right    Next MD Visit "Probably next month or so"    Prior Therapy Yes in 2019 for falls/balance      Precautions   Precautions None      Restrictions   Weight Bearing Restrictions No      Balance Screen   Has the patient fallen in the past 6 months No    Has the patient had a decrease in activity level because of a fear of falling?  Yes    Is the patient reluctant to leave  their home because of a fear of falling?  Yes      [place] Private residence    Living Arrangements Spouse/significant other   husband   Available Help at Discharge Family    Type of [place] to enter    Entrance Stairs-Number of Steps 10    Entrance Stairs-Rails Right    [place] Two level;Able to live on main level with bedroom/bathroom   doesn't go upstairs too often   Alternate Level Stairs-Number of Steps 14    Alternate Level Stairs-Rails Right    Home Equipment None   has a RW but seldom uses     Prior Function   Level of Independence Independent with basic ADLs;Independent with household mobility without device    Vocation Retired    Astronomer; "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", book puzzles      Cognition   Overall Cognitive Status Within Functional Limits for tasks assessed      Observation/Other Assessments   Focus on Therapeutic Outcomes (FOTO)  57% limited; predicted 49% limitation      ROM / Strength   AROM / PROM / Strength AROM;Strength      AROM   Overall AROM Comments LLE WFL grossly; decreased RUE and RLE AROM due to fatigue and weakness but pt able to move extremities passively [facility]    AROM Assessment Site Hip;Knee;Ankle      Strength   Strength Assessment Site Hip;Knee;Ankle    Right/Left Hip Right;Left    Right Hip Flexion 2+/5    Right/Left Knee Right;Left    Right Knee Flexion 3+/5    Right Knee Extension 3+/5    Left Knee Flexion 4-/5    Left Knee Extension 4-/5    Right/Left Ankle Right;Left    Right Ankle Dorsiflexion 2+/5    Right Ankle Plantar Flexion 3-/5   modified test in sitting   Left Ankle Dorsiflexion 4/5    Left Ankle Plantar Flexion 4/5   modified test in sitting     Berg Balance Test   Sit to Stand Able to stand  independently using hands    Standing Unsupported Able to stand safely 2 minutes    Sitting with Back Unsupported but Feet Supported on Floor or Stool Able to  sit  10 seconds   difficulty getting RLE onto stool - requires BUE to lift RLE   Stand to Sit Controls descent by using hands    Transfers Able to transfer safely, definite need of hands    Standing Unsupported with Eyes Closed Able to stand 10 seconds with supervision    Standing Unsupported with Feet Together Needs help to attain position but able to stand for 30 seconds with feet together   BUE assist for balance while getting in position   From Standing, Reach Forward with Outstretched Arm Can reach forward >12 cm safely (5")    From Standing Position, Pick up Object from Floor Able to pick up shoe, needs supervision    From Standing Position, Turn to Look Behind Over each Shoulder Looks behind from both sides and weight shifts well    Turn 360 Degrees Able to turn 360 degrees safely but slowly    Standing Unsupported, Alternately Place Feet on Step/Stool Needs assistance to keep from falling or unable to try   unable to lift RLE onto stool and requires BUE support   Standing Unsupported, One Foot in [employer] balance while stepping or standing   requires BUE to maintain balance   Standing on One Leg Tries to lift leg/unable to hold 3 seconds but remains standing independently    Total Score 31                      Objective measurements completed on examination: See above findings.       [place] Adult PT Treatment/Exercise - 06/22/20 0001      Self-Care   Self-Care Other Self-Care Comments    Other Self-Care Comments  Pt education regarding initial HEP, FOTO score and potential progress, BLE strengthening for improved endurance and functional mobility, POC. PT inquired about RLE heel wedge/AFO that was provided to pt in 2019 but pt's husband states she never wore AFO outside of PT sessions and has not worn it since.      Knee/Hip Exercises: Seated   Long Arc Quad Strengthening;Right;5 reps    Long Arc Quad Limitations 3 second hold    Marching Strengthening;Both;5 reps                    PT Education - 06/22/20 [phone]    Education Details Pt education regarding initial HEP, FOTO score and potential progress, BLE strengthening for improved endurance and functional mobility, POC. PT inquired about RLE heel wedge/AFO that was provided to pt in 2019 but pt's husband states she never wore AFO outside of PT sessions and has not worn it since.    Person(s) Educated Patient;Spouse   husband - [name]   Methods Explanation;Demonstration;Tactile cues;Verbal cues;Handout    Comprehension Verbalized understanding;Returned demonstration;Verbal cues required;Tactile cues required;Need further instruction            PT Short Term Goals - 06/22/20 0943      PT SHORT TERM GOAL #1   Title Pt will be independent with initial HEP.    Baseline Pt given initial HEP during evaluation 06/22/2020    Time 4    Period Weeks    Status New    Target Date 07/20/20      PT SHORT TERM GOAL #2   Title Pt will improve Berg Balance Score from 31/56 to >/= 35/56 to decrease fall risk.    Baseline 06/22/2020: 31/56    Time 4    Period [place]  Status New    Target Date 07/20/20      PT SHORT TERM GOAL #3   Title Pt will ambulate  250' on level ground without requiring seated rest break or having LOB to allow for improved community ambulation.    Baseline Pt reports fatigue in BLE during ambulation.    Time 4    Period Weeks    Status New    Target Date 07/20/20      PT SHORT TERM GOAL #4   Title Pt will be able to perform sit <> stand without use of BUE for support.    Baseline Pt attempted STS without BUE support 4x without being able to perform; pt was immediately able to stand from chair using BUE on armrests    Time 4    Period Weeks    Status New      PT SHORT TERM GOAL #5   Title Pt's FOTO score will improve from 57% limitation to 53% limitation or better.    Baseline 57% limited; predicted 49% limitation    Time 4    Period Weeks    Status New    Target Date  07/20/20             PT Long Term Goals - 06/22/20 0950      PT LONG TERM GOAL #1   Title Pt will be independent with advanced HEP.    Baseline Pt given initial HEP during evaluation 06/22/2020    Time 8    Period Weeks    Status New    Target Date 08/17/20      PT LONG TERM GOAL #2   Title Pt will improve Berg Balance Score from 31/56 to >/= 35/56 to decrease fall risk.    Baseline 06/22/2020: 31/56    Time 8    Period Weeks    Status New    Target Date 08/17/20      PT LONG TERM GOAL #3   Title Pt will ambulate 500' on level ground without requiring seated rest break or having LOB to allow for improved community ambulation.    Baseline Pt reports fatigue in BLE during ambulation.    Time 8    Period Weeks    Status New    Target Date 08/17/20      PT LONG TERM GOAL #4   Title Pt's RLE MMT will improve to at least 4-/5 grossly for improved functional strength.    Baseline R hemiparesis. R hip FL 2+/5 at evaluation 06/22/2020.    Time 8    Period Weeks    Status New    Target Date 08/17/20      PT LONG TERM GOAL #5   Title Pt's FOTO score will improve from 57% limitation to at least 49% limitation or better for improved functional mobility.    Baseline 57% limited; predicted 49% limitation    Time 8    Period Weeks    Status New    Target Date 08/17/20                  Plan - 06/22/20 7591    Clinical Impression Statement Patient is an 86 year old female who presents to [facility] with complaints of difficulty walking with pain/fatigue in BLE, constant pain in L hand, and R-sided hemiparesis that husband states is secondary to transverse myelitis. Pt walks from lobby to rehab gym with antalgic gait and hand held assist from her husband, [name]. She  demonstrates difficulty performing RLE AROM WNL and shows that she typically uses L arm to lift R leg due to weakness and inability to lift it independently. Pt is significantly hard of hearing with huband assisting in  relaying information throughout session. Pt should benefit from skilled PT intervention to increase BLE strength and improve tolerance during functional activities, such as cooking and grocery shopping.    Personal Factors and Comorbidities Comorbidity 1;Comorbidity 2;Comorbidity 3+;Age;Fitness;Time since onset of injury/illness/exacerbation    Comorbidities CAD, HTN, HLD    Examination-Activity Limitations Locomotion Level;Squat;Stairs;Stand;Carry;Lift;Sit    Examination-Participation Restrictions Cleaning;Meal Prep;Community Activity;Shop    Stability/Clinical Decision Making Stable/Uncomplicated    Clinical Decision Making Low    Clinical Presentation due to: H/o transverse myelitis with R hemiparesis    Rehab Potential Good    PT Frequency 2x / week    PT Duration 8 weeks    PT Treatment/Interventions ADLs/Self Care Home Management;DME Instruction;Gait training;Stair training;Orthotic Fit/Training;Patient/family education;Neuromuscular re-education;Balance training;Therapeutic exercise;Therapeutic activities;Passive range of motion;Moist Heat;Traction;Dry needling;Taping;Manual techniques;Spinal Manipulations;Joint Manipulations;Functional mobility training;Energy conservation    PT Next Visit Plan Review and update HEP, discuss potential OT to improve BUE function, ask if pt attempted use of AFO she obtained during PT in 2019 since eval, 5xSTS and/or TUG, BLE strengthening (SLR, hip ABD/EXT, marches, bridges), ankle mobility due to decreased DF (gastroc stretch, band exercises, manual)    PT Home Exercise Plan [id number] - long arc quad, seated marches    Consulted and Agree with Plan of Care Patient;Family member/caregiver    Family Member Consulted spouse - [name]           Patient will benefit from skilled therapeutic intervention in order to improve the following deficits and impairments:  Abnormal gait, Decreased activity tolerance, Decreased balance, Decreased coordination, Decreased  strength, Impaired tone, Pain, Postural dysfunction, Improper body mechanics, Decreased range of motion, Difficulty walking, Decreased endurance  Visit Diagnosis: Muscle weakness (generalized) - Plan: PT plan of care cert/re-cert  Other lack of coordination - Plan: PT plan of care cert/re-cert  Unsteadiness on feet - Plan: PT plan of care cert/re-cert  Other abnormalities of gait and mobility - Plan: PT plan of care cert/re-cert     Problem List Patient Active Problem List   Diagnosis Date Noted  . CAD (coronary artery disease) 09/04/2013  . HTN (hypertension) 09/04/2013  . HLD (hyperlipidemia) 09/04/2013  . Tobacco abuse 09/04/2013     Haydee Monica, PT, DPT 06/22/20 12:55 PM  [facility] [address] [place], [place], [address] Phone: [phone]   Fax:  [phone]  Name: Kim Sutton MRN: [id number] Date of Birth: 09/27/1933

## 2020-06-29 ENCOUNTER — Encounter: Payer: Self-pay | Admitting: Physical Therapy

## 2020-06-29 ENCOUNTER — Other Ambulatory Visit: Payer: Self-pay

## 2020-06-29 ENCOUNTER — Ambulatory Visit: Payer: Medicare Other | Admitting: Physical Therapy

## 2020-06-29 DIAGNOSIS — R278 Other lack of coordination: Secondary | ICD-10-CM

## 2020-06-29 DIAGNOSIS — R2689 Other abnormalities of gait and mobility: Secondary | ICD-10-CM

## 2020-06-29 DIAGNOSIS — R2681 Unsteadiness on feet: Secondary | ICD-10-CM

## 2020-06-29 DIAGNOSIS — M6281 Muscle weakness (generalized): Secondary | ICD-10-CM | POA: Diagnosis not present

## 2020-06-29 NOTE — Patient Instructions (Signed)
Access Code: Julienne Kass URL: [email or web address] Date: 06/29/2020 Prepared by: Hessie Diener  Exercises Sit to Stand with Counter Support - 1 x daily - 7 x weekly - 2 sets - 5 reps Standing Hip Abduction with Counter Support - 1 x daily - 7 x weekly - 2 sets - 10 reps Standing March with Counter Support - 1 x daily - 7 x weekly - 2 sets - 10 reps Heel Raises with Unilateral Counter Support - 1 x daily - 7 x weekly - 2 sets - 10 reps

## 2020-06-29 NOTE — Therapy (Signed)
[place], [place], [address] Phone: [phone]   Fax:  [phone]  Physical Therapy Treatment  Patient Details  Name: Kim Sutton MRN: [id number] Date of Birth: 05-17-1934 Referring Provider (PT): Lajean Manes, MD    Encounter Date: 06/29/2020   PT End of Session - 06/29/20 1321    Visit Number 2    Number of Visits 17    Date for PT Re-Evaluation 08/22/20    Authorization Type Medicare - FOTO at visit 6 and visit 10; PN visit 10, KX modifier visit 15    PT Start Time 1230    PT Stop Time 1313    PT Time Calculation (min) 43 min           Past Medical History:  Diagnosis Date  . Adrenal cortical adenoma of left adrenal gland    stable since 2009;  MRI in 04/2010 - no further scans  . CAD (coronary artery disease)    a. s/p NSTEMI in 2001 tx with BMS to RCA  . Colon polyps   . HLD (hyperlipidemia)   . Hx of cardiovascular stress test    Lexiscan Myoview (09/2013):  No ischemia, EF 77%, Low Risk.  Marland Kitchen Hyperglycemia   . Hypertension   . Myoclonus    on neurontin per neuro at [facility]  . Nephrolithiasis   . Nodule of right lung    a. 38mm on CXR 10/2009;  CT 05/2010 ok - no further scans  . Tobacco abuse     Past Surgical History:  Procedure Laterality Date  . CHOLECYSTECTOMY    . CORONARY ANGIOPLASTY WITH STENT PLACEMENT    . TOTAL ABDOMINAL HYSTERECTOMY      There were no vitals filed for this visit.   Subjective Assessment - 06/29/20 1233    Subjective 7/10 legs and back.              [facility] PT Assessment - 06/29/20 0001      Standardized Balance Assessment   Standardized Balance Assessment Five Times Sit to Stand    Five times sit to stand comments  61.7 sec   with LUE to rise                        [facility] Adult PT Treatment/Exercise - 06/29/20 0001      Ambulation/Gait   Ambulation/Gait Assistance 6: Modified independent (Device/Increase time)    Ambulation  Distance (Feet) 100 Feet    Assistive device Rolling walker      Knee/Hip Exercises: Standing   Hip Flexion 10 reps    Hip Abduction 10 reps      Knee/Hip Exercises: Seated   Long Arc Quad 10 reps    Marching 10 reps    Marching Limitations using UE to lift RLE and slow lower    Sit to Sand 5 reps   cues to lower controlled     Knee/Hip Exercises: Supine   Bridges Limitations discomfort in right medial thigh, requested to sit up       Manual Therapy   Soft tissue mobilization gentle STW to right medial thigh in hooklying- not tolerated - very tender      Ankle Exercises: Standing   Heel Raises 10 reps      Ankle Exercises: Seated   Other Seated Ankle Exercises seated heel and toe raises               Balance Exercises -  06/29/20 0001      Balance Exercises: Standing   Standing Eyes Opened Narrow base of support (BOS)   15 sec best with eyes open            PT Education - 06/29/20 1313    Education Details HEP    Person(s) Educated Patient    Methods Explanation;Handout    Comprehension Verbalized understanding            PT Short Term Goals - 06/22/20 0943      PT SHORT TERM GOAL #1   Title Pt will be independent with initial HEP.    Baseline Pt given initial HEP during evaluation 06/22/2020    Time 4    Period Weeks    Status New    Target Date 07/20/20      PT SHORT TERM GOAL #2   Title Pt will improve Berg Balance Score from 31/56 to >/= 35/56 to decrease fall risk.    Baseline 06/22/2020: 31/56    Time 4    Period Weeks    Status New    Target Date 07/20/20      PT SHORT TERM GOAL #3   Title Pt will ambulate  250' on level ground without requiring seated rest break or having LOB to allow for improved community ambulation.    Baseline Pt reports fatigue in BLE during ambulation.    Time 4    Period Weeks    Status New    Target Date 07/20/20      PT SHORT TERM GOAL #4   Title Pt will be able to perform sit <> stand without use of BUE  for support.    Baseline Pt attempted STS without BUE support 4x without being able to perform; pt was immediately able to stand from chair using BUE on armrests    Time 4    Period Weeks    Status New      PT SHORT TERM GOAL #5   Title Pt's FOTO score will improve from 57% limitation to 53% limitation or better.    Baseline 57% limited; predicted 49% limitation    Time 4    Period Weeks    Status New    Target Date 07/20/20             PT Long Term Goals - 06/22/20 0950      PT LONG TERM GOAL #1   Title Pt will be independent with advanced HEP.    Baseline Pt given initial HEP during evaluation 06/22/2020    Time 8    Period Weeks    Status New    Target Date 08/17/20      PT LONG TERM GOAL #2   Title Pt will improve Berg Balance Score from 31/56 to >/= 35/56 to decrease fall risk.    Baseline 06/22/2020: 31/56    Time 8    Period Weeks    Status New    Target Date 08/17/20      PT LONG TERM GOAL #3   Title Pt will ambulate 500' on level ground without requiring seated rest break or having LOB to allow for improved community ambulation.    Baseline Pt reports fatigue in BLE during ambulation.    Time 8    Period Weeks    Status New    Target Date 08/17/20      PT LONG TERM GOAL #4   Title Pt's RLE MMT will improve to at least  4-/5 grossly for improved functional strength.    Baseline R hemiparesis. R hip FL 2+/5 at evaluation 06/22/2020.    Time 8    Period Weeks    Status New    Target Date 08/17/20      PT LONG TERM GOAL #5   Title Pt's FOTO score will improve from 57% limitation to at least 49% limitation or better for improved functional mobility.    Baseline 57% limited; predicted 49% limitation    Time 8    Period Weeks    Status New    Target Date 08/17/20                 Plan - 06/29/20 1314    Clinical Impression Statement Pt arrives with husband for assist due to [facility]. He provides HHA into clinic. SHe is able to ambulate independently  from hand sanitizermstation to mat without assistance. Used clinic RW for brief ambulation and she reported increased LBP/fatiue after 1 minute. USed RW for additional standing therex and balance. Attempted hookling bridge and STW however not tolerated and pt requested to return to seated. She is independent with transfers however needs UE to assist RLE on and off mat. 5 x STS was 61.7 sec using 1 UE to assist.    PT Next Visit Plan Review and update HEP, discuss potential OT to improve BUE function, ask if pt attempted use of AFO she obtained during PT in 2019 since eval, 5xSTS (done) and/or TUG, BLE strengthening (SLR, hip ABD/EXT, marches, bridges), ankle mobility due to decreased DF (gastroc stretch, band exercises, manual)    PT Home Exercise Plan [id number] - long arc quad, seated marches,[id number]: standing march, hip abduction, sit-stand, heel raises           Patient will benefit from skilled therapeutic intervention in order to improve the following deficits and impairments:  Abnormal gait, Decreased activity tolerance, Decreased balance, Decreased coordination, Decreased strength, Impaired tone, Pain, Postural dysfunction, Improper body mechanics, Decreased range of motion, Difficulty walking, Decreased endurance  Visit Diagnosis: Muscle weakness (generalized)  Other lack of coordination  Other abnormalities of gait and mobility  Unsteadiness on feet     Problem List Patient Active Problem List   Diagnosis Date Noted  . CAD (coronary artery disease) 09/04/2013  . HTN (hypertension) 09/04/2013  . HLD (hyperlipidemia) 09/04/2013  . Tobacco abuse 09/04/2013    Dorene Ar, PTA 06/29/2020, 1:22 PM  [facility] [address] [place], [place], [address] Phone: [phone]   Fax:  [phone]  Name: Kim Sutton MRN: [id number] Date of Birth: 26-Apr-1934

## 2020-07-06 ENCOUNTER — Ambulatory Visit: Payer: Medicare Other | Attending: Geriatric Medicine | Admitting: Physical Therapy

## 2020-07-06 ENCOUNTER — Encounter: Payer: Self-pay | Admitting: Physical Therapy

## 2020-07-06 ENCOUNTER — Other Ambulatory Visit: Payer: Self-pay

## 2020-07-06 DIAGNOSIS — R2689 Other abnormalities of gait and mobility: Secondary | ICD-10-CM | POA: Diagnosis not present

## 2020-07-06 DIAGNOSIS — M6281 Muscle weakness (generalized): Secondary | ICD-10-CM | POA: Diagnosis not present

## 2020-07-06 DIAGNOSIS — R293 Abnormal posture: Secondary | ICD-10-CM | POA: Diagnosis not present

## 2020-07-06 DIAGNOSIS — R2681 Unsteadiness on feet: Secondary | ICD-10-CM | POA: Insufficient documentation

## 2020-07-06 DIAGNOSIS — R278 Other lack of coordination: Secondary | ICD-10-CM | POA: Diagnosis not present

## 2020-07-06 NOTE — Therapy (Signed)
[place], [place], [address] Phone: [phone]   Fax:  [phone]  Physical Therapy Treatment  Patient Details  Name: Kim Sutton MRN: [id number] Date of Birth: 10/25/1933 Referring Provider (PT): Lajean Manes, MD    Encounter Date: 07/06/2020   PT End of Session - 07/06/20 1239    Visit Number 2    Number of Visits 17    Date for PT Re-Evaluation 08/22/20    Authorization Type Medicare - FOTO at visit 6 and visit 10; PN visit 10, KX modifier visit 15    PT Start Time 1230    PT Stop Time 1313    PT Time Calculation (min) 43 min           Past Medical History:  Diagnosis Date  . Adrenal cortical adenoma of left adrenal gland    stable since 2009;  MRI in 04/2010 - no further scans  . CAD (coronary artery disease)    a. s/p NSTEMI in 2001 tx with BMS to RCA  . Colon polyps   . HLD (hyperlipidemia)   . Hx of cardiovascular stress test    Lexiscan Myoview (09/2013):  No ischemia, EF 77%, Low Risk.  Marland Kitchen Hyperglycemia   . Hypertension   . Myoclonus    on neurontin per neuro at [facility]  . Nephrolithiasis   . Nodule of right lung    a. 70mm on CXR 10/2009;  CT 05/2010 ok - no further scans  . Tobacco abuse     Past Surgical History:  Procedure Laterality Date  . CHOLECYSTECTOMY    . CORONARY ANGIOPLASTY WITH STENT PLACEMENT    . TOTAL ABDOMINAL HYSTERECTOMY      There were no vitals filed for this visit.   Subjective Assessment - 07/06/20 1235    Subjective No pain in back, I have a pain patch on.    Pertinent History CAD, HTN, HLD. (In chart from previous POC 2019: h/o transverse myelitis;  (husband reports Rt sided weakness is due to pt having had lesion on spine due to shingles approx. 3 yrs ago)    How long can you sit comfortably? "pain in lower legs when sitting for a while; top part of legs (thighs) hurt when sleeping"    Currently in Pain? No/denies    Pain Score --   no back pain,  some cramp feeling in left thigh.                            [place] Adult PT Treatment/Exercise - 07/06/20 0001      Knee/Hip Exercises: Aerobic   Nustep L2 UE/LE x 5 minutes       Knee/Hip Exercises: Standing   Heel Raises 20 reps    Heel Raises Limitations toe raises x 20     Hip Flexion 10 reps    Hip Flexion Limitations each, alternating    Hip Abduction 10 reps    Abduction Limitations 2 sets       Knee/Hip Exercises: Seated   Long Arc Quad 10 reps    Long Arc Quad Limitations cues for [employer] 10 reps    Marching Limitations using UE to lift RLE and slow lower    Sit to [employer] 20 reps   needed UE on the last set to tise      Manual Therapy   Soft tissue mobilization review of massage  roller to thigh -self massage      Ankle Exercises: Standing   Heel Raises --      Ankle Exercises: Seated   Other Seated Ankle Exercises seated heel and toe raises                    PT Short Term Goals - 06/22/20 0943      PT SHORT TERM GOAL #1   Title Pt will be independent with initial HEP.    Baseline Pt given initial HEP during evaluation 06/22/2020    Time 4    Period Weeks    Status New    Target Date 07/20/20      PT SHORT TERM GOAL #2   Title Pt will improve Berg Balance Score from 31/56 to >/= 35/56 to decrease fall risk.    Baseline 06/22/2020: 31/56    Time 4    Period Weeks    Status New    Target Date 07/20/20      PT SHORT TERM GOAL #3   Title Pt will ambulate  250' on level ground without requiring seated rest break or having LOB to allow for improved community ambulation.    Baseline Pt reports fatigue in BLE during ambulation.    Time 4    Period Weeks    Status New    Target Date 07/20/20      PT SHORT TERM GOAL #4   Title Pt will be able to perform sit <> stand without use of BUE for support.    Baseline Pt attempted STS without BUE support 4x without being able to perform; pt was immediately able to stand from chair  using BUE on armrests    Time 4    Period Weeks    Status New      PT SHORT TERM GOAL #5   Title Pt's FOTO score will improve from 57% limitation to 53% limitation or better.    Baseline 57% limited; predicted 49% limitation    Time 4    Period Weeks    Status New    Target Date 07/20/20             PT Long Term Goals - 06/22/20 0950      PT LONG TERM GOAL #1   Title Pt will be independent with advanced HEP.    Baseline Pt given initial HEP during evaluation 06/22/2020    Time 8    Period Weeks    Status New    Target Date 08/17/20      PT LONG TERM GOAL #2   Title Pt will improve Berg Balance Score from 31/56 to >/= 35/56 to decrease fall risk.    Baseline 06/22/2020: 31/56    Time 8    Period Weeks    Status New    Target Date 08/17/20      PT LONG TERM GOAL #3   Title Pt will ambulate 500' on level ground without requiring seated rest break or having LOB to allow for improved community ambulation.    Baseline Pt reports fatigue in BLE during ambulation.    Time 8    Period Weeks    Status New    Target Date 08/17/20      PT LONG TERM GOAL #4   Title Pt's RLE MMT will improve to at least 4-/5 grossly for improved functional strength.    Baseline R hemiparesis. R hip FL 2+/5 at evaluation 06/22/2020.  Time 8    Period Weeks    Status New    Target Date 08/17/20      PT LONG TERM GOAL #5   Title Pt's FOTO score will improve from 57% limitation to at least 49% limitation or better for improved functional mobility.    Baseline 57% limited; predicted 49% limitation    Time 8    Period Weeks    Status New    Target Date 08/17/20                 Plan - 07/06/20 1240    Clinical Impression Statement Pt reports some compliance with HEP. She reports walking at the mall for one hour and her leg felt great. She continues to need UE assist to lift RLE and is unable to use RLE to climb stairs. Continued strengthning open and closed chain suing RW for  support. Reviewed use of massage roller for home at medial thigh. She was able to complete 20 sit-stands today with intermittent UE assist.    PT Next Visit Plan Review and update HEP, discuss potential OT to improve BUE function, ask if pt attempted use of AFO she obtained during PT in 2019 since eval, 5xSTS (done) and/or TUG, BLE strengthening (SLR, hip ABD/EXT, marches, bridges), ankle mobility due to decreased DF (gastroc stretch, band exercises, manual)    PT Home Exercise Plan [id number] - long arc quad, seated marches,[id number]: standing march, hip abduction, sit-stand, heel raises    Consulted and Agree with Plan of Care Patient;Family member/caregiver    Family Member Consulted spouse - [name]           Patient will benefit from skilled therapeutic intervention in order to improve the following deficits and impairments:  Abnormal gait, Decreased activity tolerance, Decreased balance, Decreased coordination, Decreased strength, Impaired tone, Pain, Postural dysfunction, Improper body mechanics, Decreased range of motion, Difficulty walking, Decreased endurance  Visit Diagnosis: Muscle weakness (generalized)  Other lack of coordination  Unsteadiness on feet  Other abnormalities of gait and mobility     Problem List Patient Active Problem List   Diagnosis Date Noted  . CAD (coronary artery disease) 09/04/2013  . HTN (hypertension) 09/04/2013  . HLD (hyperlipidemia) 09/04/2013  . Tobacco abuse 09/04/2013    Dorene Ar, PTA 07/06/2020, 1:56 PM  [place], [place], [address] Phone: [phone]   Fax:  [phone]  Name: Kim Sutton MRN: [id number] Date of Birth: 10/11/1933

## 2020-07-09 ENCOUNTER — Ambulatory Visit: Payer: Medicare Other | Admitting: Physical Therapy

## 2020-07-09 ENCOUNTER — Other Ambulatory Visit: Payer: Self-pay

## 2020-07-09 ENCOUNTER — Encounter: Payer: Self-pay | Admitting: Physical Therapy

## 2020-07-09 DIAGNOSIS — R293 Abnormal posture: Secondary | ICD-10-CM | POA: Diagnosis not present

## 2020-07-09 DIAGNOSIS — R2681 Unsteadiness on feet: Secondary | ICD-10-CM

## 2020-07-09 DIAGNOSIS — M6281 Muscle weakness (generalized): Secondary | ICD-10-CM

## 2020-07-09 DIAGNOSIS — R278 Other lack of coordination: Secondary | ICD-10-CM

## 2020-07-09 DIAGNOSIS — R2689 Other abnormalities of gait and mobility: Secondary | ICD-10-CM | POA: Diagnosis not present

## 2020-07-09 NOTE — Therapy (Signed)
[place], [place], [address] Phone: [phone]   Fax:  [phone]  Physical Therapy Treatment  Patient Details  Name: Kim Sutton MRN: [id number] Date of Birth: 1934-05-04 Referring Provider (PT): Lajean Manes, MD    Encounter Date: 07/09/2020   PT End of Session - 07/09/20 1403    Visit Number 3    Number of Visits 17    Date for PT Re-Evaluation 08/22/20    Authorization Type Medicare - FOTO at visit 6 and visit 10; PN visit 10, KX modifier visit 15    Authorization Time Period none    PT Start Time 2229    PT Stop Time 1358    PT Time Calculation (min) 41 min           Past Medical History:  Diagnosis Date  . Adrenal cortical adenoma of left adrenal gland    stable since 2009;  MRI in 04/2010 - no further scans  . CAD (coronary artery disease)    a. s/p NSTEMI in 2001 tx with BMS to RCA  . Colon polyps   . HLD (hyperlipidemia)   . Hx of cardiovascular stress test    Lexiscan Myoview (09/2013):  No ischemia, EF 77%, Low Risk.  Marland Kitchen Hyperglycemia   . Hypertension   . Myoclonus    on neurontin per neuro at [facility]  . Nephrolithiasis   . Nodule of right lung    a. 17mm on CXR 10/2009;  CT 05/2010 ok - no further scans  . Tobacco abuse     Past Surgical History:  Procedure Laterality Date  . CHOLECYSTECTOMY    . CORONARY ANGIOPLASTY WITH STENT PLACEMENT    . TOTAL ABDOMINAL HYSTERECTOMY      There were no vitals filed for this visit.   Subjective Assessment - 07/09/20 1324    Subjective Legs are sore. That is from old age.    Currently in Pain? No/denies                             [facility] Adult PT Treatment/Exercise - 07/09/20 0001      Knee/Hip Exercises: Standing   Heel Raises 20 reps    Heel Raises Limitations alternating toe lifts    Hip Flexion 10 reps    Hip Flexion Limitations each, alternating    Hip Abduction 10 reps    Abduction Limitations 2 sets        Knee/Hip Exercises: Seated   Long Arc Quad 10 reps    Long Arc Quad Limitations cues for [employer] Squeeze 10 x 2    Other Seated Knee/Hip Exercises Seated DF yellow band , knee bent x 20    Marching 10 reps    Marching Limitations no assit of UE    Sit to Sand 20 reps   needed UE on the last set to rise     Knee/Hip Exercises: Supine   Bridges 10 reps    Other Supine Knee/Hip Exercises supine clam 10 x 2    Other Supine Knee/Hip Exercises ball squeeze xq0               Balance Exercises - 07/09/20 0001      Balance Exercises: Standing   Standing Eyes Opened Narrow base of support (BOS)   15 sec best with eyes open   Tandem Stance Eyes open;Upper extremity support 1  PT Short Term Goals - 06/22/20 0943      PT SHORT TERM GOAL #1   Title Pt will be independent with initial HEP.    Baseline Pt given initial HEP during evaluation 06/22/2020    Time 4    Period Weeks    Status New    Target Date 07/20/20      PT SHORT TERM GOAL #2   Title Pt will improve Berg Balance Score from 31/56 to >/= 35/56 to decrease fall risk.    Baseline 06/22/2020: 31/56    Time 4    Period Weeks    Status New    Target Date 07/20/20      PT SHORT TERM GOAL #3   Title Pt will ambulate  250' on level ground without requiring seated rest break or having LOB to allow for improved community ambulation.    Baseline Pt reports fatigue in BLE during ambulation.    Time 4    Period Weeks    Status New    Target Date 07/20/20      PT SHORT TERM GOAL #4   Title Pt will be able to perform sit <> stand without use of BUE for support.    Baseline Pt attempted STS without BUE support 4x without being able to perform; pt was immediately able to stand from chair using BUE on armrests    Time 4    Period Weeks    Status New      PT SHORT TERM GOAL #5   Title Pt's FOTO score will improve from 57% limitation to 53% limitation or better.    Baseline 57% limited; predicted  49% limitation    Time 4    Period Weeks    Status New    Target Date 07/20/20             PT Long Term Goals - 06/22/20 0950      PT LONG TERM GOAL #1   Title Pt will be independent with advanced HEP.    Baseline Pt given initial HEP during evaluation 06/22/2020    Time 8    Period Weeks    Status New    Target Date 08/17/20      PT LONG TERM GOAL #2   Title Pt will improve Berg Balance Score from 31/56 to >/= 35/56 to decrease fall risk.    Baseline 06/22/2020: 31/56    Time 8    Period Weeks    Status New    Target Date 08/17/20      PT LONG TERM GOAL #3   Title Pt will ambulate 500' on level ground without requiring seated rest break or having LOB to allow for improved community ambulation.    Baseline Pt reports fatigue in BLE during ambulation.    Time 8    Period Weeks    Status New    Target Date 08/17/20      PT LONG TERM GOAL #4   Title Pt's RLE MMT will improve to at least 4-/5 grossly for improved functional strength.    Baseline R hemiparesis. R hip FL 2+/5 at evaluation 06/22/2020.    Time 8    Period Weeks    Status New    Target Date 08/17/20      PT LONG TERM GOAL #5   Title Pt's FOTO score will improve from 57% limitation to at least 49% limitation or better for improved functional mobility.    Baseline 57% limited;  predicted 49% limitation    Time 8    Period Weeks    Status New    Target Date 08/17/20                 Plan - 07/09/20 1404    Clinical Impression Statement Pt arrives reporting her normal leg soreness and c/o pain at lower leg from hitting it on car door several days ago. Continued with closed chain strength and balance using RW for UE assist. In seated she demostrates improved hip AROM on the right and did not need assist from hands however did demonstrate compensatory movements with trunk. Began stretching right gastroc and strengtheninf DF in sitting. She cannot actively DF with extended knee. SHe has palpable  tightness and tenderness in gastroc.    PT Next Visit Plan Review and update HEP, discuss potential OT to improve BUE function, ask if pt attempted use of AFO she obtained during PT in 2019 since eval, 5xSTS (done) and/or TUG, BLE strengthening (SLR, hip ABD/EXT, marches, bridges), ankle mobility due to decreased DF (gastroc stretch, band exercises, manual)    PT Home Exercise Plan [id number] - long arc quad, seated marches,[id number]: standing march, hip abduction, sit-stand, heel raises    Consulted and Agree with Plan of Care Patient;Family member/caregiver    Family Member Consulted spouse - [name]           Patient will benefit from skilled therapeutic intervention in order to improve the following deficits and impairments:  Abnormal gait,Decreased activity tolerance,Decreased balance,Decreased coordination,Decreased strength,Impaired tone,Pain,Postural dysfunction,Improper body mechanics,Decreased range of motion,Difficulty walking,Decreased endurance  Visit Diagnosis: Muscle weakness (generalized)  Other lack of coordination  Unsteadiness on feet  Other abnormalities of gait and mobility     Problem List Patient Active Problem List   Diagnosis Date Noted  . CAD (coronary artery disease) 09/04/2013  . HTN (hypertension) 09/04/2013  . HLD (hyperlipidemia) 09/04/2013  . Tobacco abuse 09/04/2013    Dorene Ar, PTA 07/09/2020, 2:11 PM  [facility] [address] [place], [place], [address] Phone: [phone]   Fax:  [phone]  Name: Kim Sutton MRN: [id number] Date of Birth: 1934-06-08

## 2020-07-13 ENCOUNTER — Ambulatory Visit: Payer: Medicare Other

## 2020-07-13 ENCOUNTER — Other Ambulatory Visit: Payer: Self-pay

## 2020-07-13 DIAGNOSIS — M6281 Muscle weakness (generalized): Secondary | ICD-10-CM | POA: Diagnosis not present

## 2020-07-13 DIAGNOSIS — R2681 Unsteadiness on feet: Secondary | ICD-10-CM

## 2020-07-13 DIAGNOSIS — R293 Abnormal posture: Secondary | ICD-10-CM | POA: Diagnosis not present

## 2020-07-13 DIAGNOSIS — R2689 Other abnormalities of gait and mobility: Secondary | ICD-10-CM | POA: Diagnosis not present

## 2020-07-13 DIAGNOSIS — R278 Other lack of coordination: Secondary | ICD-10-CM | POA: Diagnosis not present

## 2020-07-13 NOTE — Therapy (Signed)
[place], [place], [address] Phone: [phone]   Fax:  [phone]  Physical Therapy Treatment  Patient Details  Name: Kim Sutton MRN: [id number] Date of Birth: 22-Feb-1934 Referring Provider (PT): Lajean Manes, MD    Encounter Date: 07/13/2020   PT End of Session - 07/13/20 0914    Visit Number 4    Number of Visits 17    Date for PT Re-Evaluation 08/22/20    Authorization Type Medicare - FOTO at visit 6 and visit 10; PN visit 10, KX modifier visit 15    Authorization Time Period none    PT Start Time 0915    PT Stop Time 0955    PT Time Calculation (min) 40 min    Equipment Utilized During Treatment Gait belt   gait belt during standing exercises   Activity Tolerance Patient tolerated treatment well    Behavior During Therapy WFL for tasks assessed/performed           Past Medical History:  Diagnosis Date  . Adrenal cortical adenoma of left adrenal gland    stable since 2009;  MRI in 04/2010 - no further scans  . CAD (coronary artery disease)    a. s/p NSTEMI in 2001 tx with BMS to RCA  . Colon polyps   . HLD (hyperlipidemia)   . Hx of cardiovascular stress test    Lexiscan Myoview (09/2013):  No ischemia, EF 77%, Low Risk.  Marland Kitchen Hyperglycemia   . Hypertension   . Myoclonus    on neurontin per neuro at [facility]  . Nephrolithiasis   . Nodule of right lung    a. 38mm on CXR 10/2009;  CT 05/2010 ok - no further scans  . Tobacco abuse     Past Surgical History:  Procedure Laterality Date  . CHOLECYSTECTOMY    . CORONARY ANGIOPLASTY WITH STENT PLACEMENT    . TOTAL ABDOMINAL HYSTERECTOMY      There were no vitals filed for this visit.   Subjective Assessment - 07/13/20 1159    Subjective "I just have that same soreness that's always there."    Patient is accompained by: Family member   husband - [name]   Pertinent History CAD, HTN, HLD. (In chart from previous POC 2019: h/o transverse  myelitis;  (husband reports Rt sided weakness is due to pt having had lesion on spine due to shingles approx. 3 yrs ago)    Limitations Standing;Walking;Sitting;House hold activities;Lifting    How long can you sit comfortably? "pain in lower legs when sitting for a while; top part of legs (thighs) hurt when sleeping"    How long can you stand comfortably? 10-15 minutes before legs get tired    How long can you walk comfortably? 10-15 minutes before legs get tired    Patient Stated Goals "Just walk around without pain"    Currently in Pain? Yes    Pain Score 6     Pain Location Leg    Pain Orientation Right;Left    Pain Descriptors / Indicators Sore    Pain Type Chronic pain    Pain Onset More than a month ago              [facility] PT Assessment - 07/13/20 0001      Assessment   Medical Diagnosis Unspecified abnormalities of gait and mobility R26.9    Referring Provider (PT) Lajean Manes, MD  [facility] Adult PT Treatment/Exercise - 07/13/20 0001      Knee/Hip Exercises: Standing   Heel Raises Both;20 reps    Hip Abduction AROM;Stengthening;Both;2 sets;10 reps;Knee straight    Hip Extension AROM;Stengthening;Both;2 sets;10 reps      Knee/Hip Exercises: Seated   Long Arc Quad AROM;Strengthening    Long Arc Quad Limitations cues for [employer] 2 x 10    Clamshell with TheraBand Yellow   x 20   Hamstring Curl AROM;Strengthening;Both;20 reps    Hamstring Limitations yellow theraband    Sit to Sand 20 reps   UE support initially then BUE supported on thighs     Knee/Hip Exercises: Supine   Bridges AROM;Strengthening;Both;2 sets;10 reps    Knee Flexion AROM;Strengthening;Both;15 reps    Knee Flexion Limitations alternating marches 15x each LE    Other Supine Knee/Hip Exercises --      Ankle Exercises: Standing   Other Standing Ankle Exercises Tandem standing x 45 sec each (LLE posterior then RLE posterior)                   PT Education - 07/13/20 1200    Education Details Reviewed HEP and provided pt with yellow theraband.    Person(s) Educated Patient;Spouse   husband   Methods Explanation;Demonstration;Tactile cues;Verbal cues    Comprehension Verbalized understanding;Returned demonstration;Verbal cues required;Tactile cues required            PT Short Term Goals - 07/13/20 1327      PT SHORT TERM GOAL #1   Title Pt will be independent with initial HEP.    Baseline Pt given initial HEP during evaluation 06/22/2020    Time 4    Period Weeks    Status On-going    Target Date 07/20/20      PT SHORT TERM GOAL #2   Title Pt will improve Berg Balance Score from 31/56 to >/= 35/56 to decrease fall risk.    Baseline 06/22/2020: 31/56    Time 4    Period Weeks    Status On-going    Target Date 07/20/20      PT SHORT TERM GOAL #3   Title Pt will ambulate  250' on level ground without requiring seated rest break or having LOB to allow for improved community ambulation.    Baseline Pt reports fatigue in BLE during ambulation.    Time 4    Period Weeks    Status On-going    Target Date 07/20/20      PT SHORT TERM GOAL #4   Title Pt will be able to perform sit <> stand without use of BUE for support.    Baseline Pt attempted STS without BUE support 4x without being able to perform; pt was immediately able to stand from chair using BUE on armrests    Time 4    Period Weeks    Status Achieved      PT SHORT TERM GOAL #5   Title Pt's FOTO score will improve from 57% limitation to 53% limitation or better.    Baseline 57% limited; predicted 49% limitation    Time 4    Period Weeks    Status On-going    Target Date 07/20/20             PT Long Term Goals - 06/22/20 0950      PT LONG TERM GOAL #1   Title Pt will be independent with advanced HEP.  Baseline Pt given initial HEP during evaluation 06/22/2020    Time 8    Period Weeks    Status New    Target Date 08/17/20       PT LONG TERM GOAL #2   Title Pt will improve Berg Balance Score from 31/56 to >/= 35/56 to decrease fall risk.    Baseline 06/22/2020: 31/56    Time 8    Period Weeks    Status New    Target Date 08/17/20      PT LONG TERM GOAL #3   Title Pt will ambulate 500' on level ground without requiring seated rest break or having LOB to allow for improved community ambulation.    Baseline Pt reports fatigue in BLE during ambulation.    Time 8    Period Weeks    Status New    Target Date 08/17/20      PT LONG TERM GOAL #4   Title Pt's RLE MMT will improve to at least 4-/5 grossly for improved functional strength.    Baseline R hemiparesis. R hip FL 2+/5 at evaluation 06/22/2020.    Time 8    Period Weeks    Status New    Target Date 08/17/20      PT LONG TERM GOAL #5   Title Pt's FOTO score will improve from 57% limitation to at least 49% limitation or better for improved functional mobility.    Baseline 57% limited; predicted 49% limitation    Time 8    Period Weeks    Status New    Target Date 08/17/20                 Plan - 07/13/20 1202    Clinical Impression Statement Pt describes her typical soreness in BLE (R > L) and has continued complaints of pain in left lower leg from when she hit it on the car door last week upon arrival. She experienced fatigue during interventions and expressed her R leg feeling "wobbly" and "tired" at end of session. Pt encouraged to avoid posterolateral trunk lean during seated marches, during which she then had increased difficulty and decreased AROM with. Pt will benefit from continued R gastroc stretching and RLE strengthening to allow for improved functional mobility and activity tolerance.    Personal Factors and Comorbidities Comorbidity 1;Comorbidity 2;Comorbidity 3+;Age;Fitness;Time since onset of injury/illness/exacerbation    Comorbidities CAD, HTN, HLD    Examination-Activity Limitations Locomotion  Level;Squat;Stairs;Stand;Carry;Lift;Sit    Examination-Participation Restrictions Cleaning;Meal Prep;Community Activity;Shop    Rehab Potential Good    Clinical Impairments Affecting Rehab Potential chronicity of deficits - approx. 3 yrs since onset (due to transverse myelitis?)    PT Frequency 2x / week    PT Treatment/Interventions ADLs/Self Care Home Management;DME Instruction;Gait training;Stair training;Orthotic Fit/Training;Patient/family education;Neuromuscular re-education;Balance training;Therapeutic exercise;Therapeutic activities;Passive range of motion;Moist Heat;Traction;Dry needling;Taping;Manual techniques;Spinal Manipulations;Joint Manipulations;Functional mobility training;Energy conservation    PT Next Visit Plan Review and update HEP, ankle mobility due to decreased DF (gastroc stretch, band exercises, manual)    PT Home Exercise Plan [id number] - long arc quad, seated marches,[id number]: standing march, hip abduction, sit-stand, heel raises    Consulted and Agree with Plan of Care Patient;Family member/caregiver    Family Member Consulted spouse - [name]           Patient will benefit from skilled therapeutic intervention in order to improve the following deficits and impairments:  Abnormal gait,Decreased activity tolerance,Decreased balance,Decreased coordination,Decreased strength,Impaired tone,Pain,Postural dysfunction,Improper body mechanics,Decreased range of motion,Difficulty walking,Decreased  endurance  Visit Diagnosis: Muscle weakness (generalized)  Other lack of coordination  Unsteadiness on feet  Other abnormalities of gait and mobility     Problem List Patient Active Problem List   Diagnosis Date Noted  . CAD (coronary artery disease) 09/04/2013  . HTN (hypertension) 09/04/2013  . HLD (hyperlipidemia) 09/04/2013  . Tobacco abuse 09/04/2013     Haydee Monica, PT, DPT 07/13/20 1:29 PM  [facility] [address] [place], [place], [address] Phone: [phone]   Fax:  [phone]  Name: MAO LOCKNER MRN: [id number] Date of Birth: Jul 31, 1934

## 2020-07-15 DIAGNOSIS — G5602 Carpal tunnel syndrome, left upper limb: Secondary | ICD-10-CM | POA: Diagnosis not present

## 2020-07-16 ENCOUNTER — Other Ambulatory Visit: Payer: Self-pay

## 2020-07-16 ENCOUNTER — Encounter: Payer: Self-pay | Admitting: Physical Therapy

## 2020-07-16 ENCOUNTER — Ambulatory Visit: Payer: Medicare Other | Admitting: Physical Therapy

## 2020-07-16 DIAGNOSIS — R2681 Unsteadiness on feet: Secondary | ICD-10-CM | POA: Diagnosis not present

## 2020-07-16 DIAGNOSIS — M79642 Pain in left hand: Secondary | ICD-10-CM | POA: Diagnosis not present

## 2020-07-16 DIAGNOSIS — R2689 Other abnormalities of gait and mobility: Secondary | ICD-10-CM

## 2020-07-16 DIAGNOSIS — M6281 Muscle weakness (generalized): Secondary | ICD-10-CM | POA: Diagnosis not present

## 2020-07-16 DIAGNOSIS — R293 Abnormal posture: Secondary | ICD-10-CM | POA: Diagnosis not present

## 2020-07-16 DIAGNOSIS — R278 Other lack of coordination: Secondary | ICD-10-CM

## 2020-07-16 DIAGNOSIS — G5602 Carpal tunnel syndrome, left upper limb: Secondary | ICD-10-CM | POA: Diagnosis not present

## 2020-07-16 NOTE — Therapy (Signed)
[place], [place], [address] Phone: [phone]   Fax:  [phone]  Physical Therapy Treatment  Patient Details  Name: Kim Sutton MRN: [id number] Date of Birth: 03-18-1934 Referring Provider (PT): Lajean Manes, MD    Encounter Date: 07/16/2020   PT End of Session - 07/16/20 0955    Visit Number 5    Number of Visits 17    Date for PT Re-Evaluation 08/22/20    Authorization Type Medicare - FOTO at visit 6 and visit 10; PN visit 10, KX modifier visit 15    PT Start Time 0950    PT Stop Time 1030    PT Time Calculation (min) 40 min           Past Medical History:  Diagnosis Date  . Adrenal cortical adenoma of left adrenal gland    stable since 2009;  MRI in 04/2010 - no further scans  . CAD (coronary artery disease)    a. s/p NSTEMI in 2001 tx with BMS to RCA  . Colon polyps   . HLD (hyperlipidemia)   . Hx of cardiovascular stress test    Lexiscan Myoview (09/2013):  No ischemia, EF 77%, Low Risk.  Marland Kitchen Hyperglycemia   . Hypertension   . Myoclonus    on neurontin per neuro at [facility]  . Nephrolithiasis   . Nodule of right lung    a. 39mm on CXR 10/2009;  CT 05/2010 ok - no further scans  . Tobacco abuse     Past Surgical History:  Procedure Laterality Date  . CHOLECYSTECTOMY    . CORONARY ANGIOPLASTY WITH STENT PLACEMENT    . TOTAL ABDOMINAL HYSTERECTOMY      There were no vitals filed for this visit.                      [facility] Adult PT Treatment/Exercise - 07/16/20 0001      Knee/Hip Exercises: Stretches   Gastroc Stretch Limitations seated with strap on right   PTA assisting     Knee/Hip Exercises: Aerobic   Nustep L3 UE/LE x 5 minutes      Knee/Hip Exercises: Standing   Heel Raises Both;20 reps    Hip Flexion 10 reps    Hip Flexion Limitations each, alternating    Hip Abduction 10 reps    Abduction Limitations 2 sets       Knee/Hip Exercises: Seated   Long  Arc Quad AROM;Strengthening    Long Arc Quad Limitations cues for [employer] Squeeze 2 x 10    Clamshell with TheraBand Red    Sit to [employer] 20 reps      Knee/Hip Exercises: Supine   Bridges AROM;Strengthening;Both;2 sets;10 reps    Knee Flexion Limitations alternating marches 15x each LE    Other Supine Knee/Hip Exercises red band clam 10 x 2                    PT Short Term Goals - 07/13/20 1327      PT SHORT TERM GOAL #1   Title Pt will be independent with initial HEP.    Baseline Pt given initial HEP during evaluation 06/22/2020    Time 4    Period Weeks    Status On-going    Target Date 07/20/20      PT SHORT TERM GOAL #2   Title Pt will improve Berg Balance Score from 31/56  to >/= 35/56 to decrease fall risk.    Baseline 06/22/2020: 31/56    Time 4    Period Weeks    Status On-going    Target Date 07/20/20      PT SHORT TERM GOAL #3   Title Pt will ambulate  250' on level ground without requiring seated rest break or having LOB to allow for improved community ambulation.    Baseline Pt reports fatigue in BLE during ambulation.    Time 4    Period Weeks    Status On-going    Target Date 07/20/20      PT SHORT TERM GOAL #4   Title Pt will be able to perform sit <> stand without use of BUE for support.    Baseline Pt attempted STS without BUE support 4x without being able to perform; pt was immediately able to stand from chair using BUE on armrests    Time 4    Period Weeks    Status Achieved      PT SHORT TERM GOAL #5   Title Pt's FOTO score will improve from 57% limitation to 53% limitation or better.    Baseline 57% limited; predicted 49% limitation    Time 4    Period Weeks    Status On-going    Target Date 07/20/20             PT Long Term Goals - 06/22/20 0950      PT LONG TERM GOAL #1   Title Pt will be independent with advanced HEP.    Baseline Pt given initial HEP during evaluation 06/22/2020    Time 8    Period Weeks    Status  New    Target Date 08/17/20      PT LONG TERM GOAL #2   Title Pt will improve Berg Balance Score from 31/56 to >/= 35/56 to decrease fall risk.    Baseline 06/22/2020: 31/56    Time 8    Period Weeks    Status New    Target Date 08/17/20      PT LONG TERM GOAL #3   Title Pt will ambulate 500' on level ground without requiring seated rest break or having LOB to allow for improved community ambulation.    Baseline Pt reports fatigue in BLE during ambulation.    Time 8    Period Weeks    Status New    Target Date 08/17/20      PT LONG TERM GOAL #4   Title Pt's RLE MMT will improve to at least 4-/5 grossly for improved functional strength.    Baseline R hemiparesis. R hip FL 2+/5 at evaluation 06/22/2020.    Time 8    Period Weeks    Status New    Target Date 08/17/20      PT LONG TERM GOAL #5   Title Pt's FOTO score will improve from 57% limitation to at least 49% limitation or better for improved functional mobility.    Baseline 57% limited; predicted 49% limitation    Time 8    Period Weeks    Status New    Target Date 08/17/20                 Plan - 07/16/20 0954    Clinical Impression Statement Pt arrives with antalgic gait and reports not sleeping well last night.    Personal Factors and Comorbidities Comorbidity 1;Comorbidity 2;Comorbidity 3+;Age;Fitness;Time since onset of injury/illness/exacerbation  PT Next Visit Plan Review and update HEP, ankle mobility due to decreased DF (gastroc stretch, band exercises, manual)    PT Home Exercise Plan [id number] - long arc quad, seated marches,[id number]: standing march, hip abduction, sit-stand, heel raises           Patient will benefit from skilled therapeutic intervention in order to improve the following deficits and impairments:  Abnormal gait,Decreased activity tolerance,Decreased balance,Decreased coordination,Decreased strength,Impaired tone,Pain,Postural dysfunction,Improper body mechanics,Decreased range of  motion,Difficulty walking,Decreased endurance  Visit Diagnosis: Muscle weakness (generalized)  Other lack of coordination  Unsteadiness on feet  Other abnormalities of gait and mobility     Problem List Patient Active Problem List   Diagnosis Date Noted  . CAD (coronary artery disease) 09/04/2013  . HTN (hypertension) 09/04/2013  . HLD (hyperlipidemia) 09/04/2013  . Tobacco abuse 09/04/2013    Dorene Ar, PTA 07/16/2020, 10:27 AM  [place], [place], [address] Phone: [phone]   Fax:  [phone]  Name: Kim Sutton MRN: [id number] Date of Birth: 1933-08-22

## 2020-07-20 ENCOUNTER — Other Ambulatory Visit: Payer: Self-pay

## 2020-07-20 ENCOUNTER — Ambulatory Visit: Payer: Medicare Other

## 2020-07-20 DIAGNOSIS — R2681 Unsteadiness on feet: Secondary | ICD-10-CM | POA: Diagnosis not present

## 2020-07-20 DIAGNOSIS — R278 Other lack of coordination: Secondary | ICD-10-CM

## 2020-07-20 DIAGNOSIS — Z23 Encounter for immunization: Secondary | ICD-10-CM | POA: Diagnosis not present

## 2020-07-20 DIAGNOSIS — R2689 Other abnormalities of gait and mobility: Secondary | ICD-10-CM

## 2020-07-20 DIAGNOSIS — R293 Abnormal posture: Secondary | ICD-10-CM | POA: Diagnosis not present

## 2020-07-20 DIAGNOSIS — M6281 Muscle weakness (generalized): Secondary | ICD-10-CM | POA: Diagnosis not present

## 2020-07-20 NOTE — Therapy (Signed)
[place], [place], [address] Phone: [phone]   Fax:  [phone]  Physical Therapy Treatment  Patient Details  Name: Kim Sutton MRN: [id number] Date of Birth: [birth year] Referring Provider (PT): Lajean Manes, MD    Encounter Date: 07/20/2020   PT End of Session - 07/20/20 3335    Visit Number 6    Number of Visits 17    Date for PT Re-Evaluation 08/22/20    Authorization Type Medicare - FOTO at visit 10; PN visit 10, KX modifier visit 15    PT Start Time [phone]   pt arrived late   PT Stop Time 1002    PT Time Calculation (min) 39 min    Activity Tolerance Patient tolerated treatment well    Behavior During Therapy [facility] for tasks assessed/performed           Past Medical History:  Diagnosis Date  . Adrenal cortical adenoma of left adrenal gland    stable since 2009;  MRI in 04/2010 - no further scans  . CAD (coronary artery disease)    a. s/p NSTEMI in 2001 tx with BMS to RCA  . Colon polyps   . HLD (hyperlipidemia)   . Hx of cardiovascular stress test    Lexiscan Myoview (09/2013):  No ischemia, EF 77%, Low Risk.  Marland Kitchen Hyperglycemia   . Hypertension   . Myoclonus    on neurontin per neuro at [facility]  . Nephrolithiasis   . Nodule of right lung    a. 59mm on CXR 10/2009;  CT 05/2010 ok - no further scans  . Tobacco abuse     Past Surgical History:  Procedure Laterality Date  . CHOLECYSTECTOMY    . CORONARY ANGIOPLASTY WITH STENT PLACEMENT    . TOTAL ABDOMINAL HYSTERECTOMY      There were no vitals filed for this visit.   Subjective Assessment - 07/20/20 1228    Subjective "Both of my legs are sore." Pt explains still having some tenderness and pain in L lower leg where she hit her leg on the car door. Pt and pt's husband state they have been changing bandages regularly and that the cut on her leg is healing well.    Patient is accompained by: Family member   husband - [name]   Pertinent  History CAD, HTN, HLD. (In chart from previous POC 2019: h/o transverse myelitis;  (husband reports Rt sided weakness is due to pt having had lesion on spine due to shingles approx. 3 yrs ago)    Limitations Standing;Walking;Sitting;House hold activities;Lifting    How long can you sit comfortably? "pain in lower legs when sitting for a while; top part of legs (thighs) hurt when sleeping"    How long can you stand comfortably? 10-15 minutes before legs get tired    How long can you walk comfortably? 10-15 minutes before legs get tired    Patient Stated Goals "Just walk around without pain"    Currently in Pain? Yes    Pain Score 6     Pain Location Leg    Pain Orientation Right;Left    Pain Descriptors / Indicators Sore    Pain Type Chronic pain    Pain Onset More than a month ago              [facility] PT Assessment - 07/20/20 0001      Assessment   Medical Diagnosis Unspecified abnormalities of gait and mobility R26.9  Referring Provider (PT) Lajean Manes, MD       Observation/Other Assessments   Focus on Therapeutic Outcomes (FOTO)  58% limited; predicted 49% limitation                         [facility] Adult PT Treatment/Exercise - 07/20/20 0001      Self-Care   Self-Care Other Self-Care Comments    Other Self-Care Comments  Reviewed and printed updated HEP. Demonstrated use of roller and/or tennis ball along R calf for STM.      Knee/Hip Exercises: Stretches   Press photographer Right;3 reps;30 seconds    Gastroc Stretch Limitations RLE while seated at EOM with green strap x 30 sec then 2 x 30 sec on slant board (least amount of incline)      Knee/Hip Exercises: Aerobic   Nustep L4 x 5 min LE only      Knee/Hip Exercises: Standing   Hip Flexion Stengthening;Both;15 reps    Hip Flexion Limitations Alternating marches 15x each LE    Hip Abduction Stengthening;Both;2 sets;10 reps;Knee straight    Hip Extension Stengthening;Both;Knee straight;15 reps     Extension Limitations Fatigue after 15 reps      Knee/Hip Exercises: Seated   Long Arc Quad Strengthening;Both;10 reps    Long Arc Quad Limitations cues for DF    Other Seated Knee/Hip Exercises Seated toe raises x 20 each LE      Manual Therapy   Manual Therapy Soft tissue mobilization    Soft tissue mobilization STM and demonstration of roller along R gastroc-soleus                  PT Education - 07/20/20 1048    Education Details Reviewed and printed updated HEP. Demonstrated use of roller and/or tennis ball along R calf for STM. Reviewed updated FOTO and discussed potential progress with continued skilled PT.    Person(s) Educated Patient;Spouse   pt's husband   Methods Explanation;Demonstration;Tactile cues;Verbal cues;Handout    Comprehension Verbalized understanding;Returned demonstration;Verbal cues required;Tactile cues required;Need further instruction            PT Short Term Goals - 07/20/20 1230      PT SHORT TERM GOAL #1   Title Pt will be independent with initial HEP.    Baseline Pt given initial HEP during evaluation 06/22/2020    Time 4    Period Weeks    Status Achieved    Target Date 07/20/20      PT SHORT TERM GOAL #2   Title Pt will improve Berg Balance Score from 31/56 to >/= 35/56 to decrease fall risk.    Baseline 06/22/2020: 31/56    Time 4    Period Weeks    Status On-going    Target Date 07/20/20      PT SHORT TERM GOAL #3   Title Pt will ambulate  250' on level ground without requiring seated rest break or having LOB to allow for improved community ambulation.    Baseline Pt reports fatigue in BLE during ambulation.    Time 4    Period Weeks    Status On-going    Target Date 07/20/20      PT SHORT TERM GOAL #4   Title Pt will be able to perform sit <> stand without use of BUE for support.    Baseline Pt attempted STS without BUE support 4x without being able to perform; pt was immediately able to stand  from chair using BUE on  armrests    Time 4    Period Weeks    Status Achieved      PT SHORT TERM GOAL #5   Title Pt's FOTO score will improve from 57% limitation to 53% limitation or better.    Baseline 57% limited; predicted 49% limitation. 07/20/2020: 58% limited; predicted 49% limitation    Time 4    Period Weeks    Status On-going    Target Date 07/20/20             PT Long Term Goals - 06/22/20 0950      PT LONG TERM GOAL #1   Title Pt will be independent with advanced HEP.    Baseline Pt given initial HEP during evaluation 06/22/2020    Time 8    Period Weeks    Status New    Target Date 08/17/20      PT LONG TERM GOAL #2   Title Pt will improve Berg Balance Score from 31/56 to >/= 35/56 to decrease fall risk.    Baseline 06/22/2020: 31/56    Time 8    Period Weeks    Status New    Target Date 08/17/20      PT LONG TERM GOAL #3   Title Pt will ambulate 500' on level ground without requiring seated rest break or having LOB to allow for improved community ambulation.    Baseline Pt reports fatigue in BLE during ambulation.    Time 8    Period Weeks    Status New    Target Date 08/17/20      PT LONG TERM GOAL #4   Title Pt's RLE MMT will improve to at least 4-/5 grossly for improved functional strength.    Baseline R hemiparesis. R hip FL 2+/5 at evaluation 06/22/2020.    Time 8    Period Weeks    Status New    Target Date 08/17/20      PT LONG TERM GOAL #5   Title Pt's FOTO score will improve from 57% limitation to at least 49% limitation or better for improved functional mobility.    Baseline 57% limited; predicted 49% limitation    Time 8    Period Weeks    Status New    Target Date 08/17/20                 Plan - 07/20/20 1047    Clinical Impression Statement Pt presents with antalgic gait with complaints of BLE soreness and continued pain in L lower leg from when she hit her leg against the car door. Pt had fair tolerance with exercises with several complaints of  fatigue but no significant increase in pain. Pt with TTP along R gastroc-soleus. She will continue to benefit from skilled PT to imrpove balance, endurange, and strength for increased safety and tolerance with daily activities.    Personal Factors and Comorbidities Comorbidity 1;Comorbidity 2;Comorbidity 3+;Age;Fitness;Time since onset of injury/illness/exacerbation    Comorbidities CAD, HTN, HLD    Examination-Activity Limitations Locomotion Level;Squat;Stairs;Stand;Carry;Lift;Sit    Examination-Participation Restrictions Cleaning;Meal Prep;Community Activity;Shop    Stability/Clinical Decision Making Stable/Uncomplicated    Clinical Impairments Affecting Rehab Potential chronicity of deficits - approx. 3 yrs since onset (due to transverse myelitis?)    PT Frequency 2x / week    PT Duration 8 weeks    PT Treatment/Interventions ADLs/Self Care Home Management;DME Instruction;Gait training;Stair training;Orthotic Fit/Training;Patient/family education;Neuromuscular re-education;Balance training;Therapeutic exercise;Therapeutic activities;Passive range of motion;Moist Heat;Traction;Dry  needling;Taping;Manual techniques;Spinal Manipulations;Joint Manipulations;Functional mobility training;Energy conservation    PT Next Visit Plan Reassess BERG balance score and attempt 250 feet ambulation for STG. Review and update HEP, ankle mobility due to decreased DF (gastroc stretch, band exercises, manual)    PT Home Exercise Plan [id number] - long arc quad, seated marches, standing march, hip abduction, sit-stand, heel raises, seated toe raise, seated HS stretch, seated calf stretch with strap and/or standing calf stretch,    Consulted and Agree with Plan of Care Patient;Family member/caregiver    Family Member Consulted spouse - [name]           Patient will benefit from skilled therapeutic intervention in order to improve the following deficits and impairments:  Abnormal gait,Decreased activity  tolerance,Decreased balance,Decreased coordination,Decreased strength,Impaired tone,Pain,Postural dysfunction,Improper body mechanics,Decreased range of motion,Difficulty walking,Decreased endurance  Visit Diagnosis: Muscle weakness (generalized)  Other lack of coordination  Unsteadiness on feet  Other abnormalities of gait and mobility     Problem List Patient Active Problem List   Diagnosis Date Noted  . CAD (coronary artery disease) 09/04/2013  . HTN (hypertension) 09/04/2013  . HLD (hyperlipidemia) 09/04/2013  . Tobacco abuse 09/04/2013     Haydee Monica, PT, DPT 07/20/20 12:46 PM  [facility] [address] [place], [place], [address] Phone: [phone]   Fax:  [phone]  Name: CHANIN FRUMKIN MRN: [id number] Date of Birth: [birth year]

## 2020-07-23 ENCOUNTER — Other Ambulatory Visit: Payer: Self-pay

## 2020-07-23 ENCOUNTER — Ambulatory Visit: Payer: Medicare Other | Admitting: Physical Therapy

## 2020-07-23 DIAGNOSIS — R2689 Other abnormalities of gait and mobility: Secondary | ICD-10-CM

## 2020-07-23 DIAGNOSIS — M6281 Muscle weakness (generalized): Secondary | ICD-10-CM

## 2020-07-23 DIAGNOSIS — R278 Other lack of coordination: Secondary | ICD-10-CM | POA: Diagnosis not present

## 2020-07-23 DIAGNOSIS — R2681 Unsteadiness on feet: Secondary | ICD-10-CM | POA: Diagnosis not present

## 2020-07-23 DIAGNOSIS — R293 Abnormal posture: Secondary | ICD-10-CM | POA: Diagnosis not present

## 2020-07-23 NOTE — Therapy (Signed)
[place], [place], [address] Phone: [phone]   Fax:  [phone]  Physical Therapy Treatment  Patient Details  Name: Kim Sutton MRN: [id number] Date of Birth: 1934/01/07 Referring Provider (PT): Lajean Manes, MD    Encounter Date: 07/23/2020   PT End of Session - 07/23/20 1015    Visit Number 7    Number of Visits 17    Date for PT Re-Evaluation 08/22/20    Authorization Type Medicare - FOTO at visit 10; PN visit 10, KX modifier visit 15    PT Start Time 0930    PT Stop Time 1013    PT Time Calculation (min) 43 min           Past Medical History:  Diagnosis Date  . Adrenal cortical adenoma of left adrenal gland    stable since 2009;  MRI in 04/2010 - no further scans  . CAD (coronary artery disease)    a. s/p NSTEMI in 2001 tx with BMS to RCA  . Colon polyps   . HLD (hyperlipidemia)   . Hx of cardiovascular stress test    Lexiscan Myoview (09/2013):  No ischemia, EF 77%, Low Risk.  Marland Kitchen Hyperglycemia   . Hypertension   . Myoclonus    on neurontin per neuro at [facility]  . Nephrolithiasis   . Nodule of right lung    a. 87m on CXR 10/2009;  CT 05/2010 ok - no further scans  . Tobacco abuse     Past Surgical History:  Procedure Laterality Date  . CHOLECYSTECTOMY    . CORONARY ANGIOPLASTY WITH STENT PLACEMENT    . TOTAL ABDOMINAL HYSTERECTOMY      There were no vitals filed for this visit.                      [facility] - 07[facility] 0001      Ambulation/Gait   Ambulation/Gait Yes    Ambulation/Gait Assistance 5: Supervision    Ambulation Distance (Feet) 572 Feet    Assistive device None      Knee/Hip Exercises: Standing   Hip Flexion 10 reps;Right;Left    Hip Flexion Limitations 2#    Hip Abduction Stengthening;Both;2 sets;10 reps;Knee straight    Abduction Limitations 2#    Hip Extension 10 reps;2 sets;Right;Left    Extension Limitations  2#      Knee/Hip Exercises: Seated   Long Arc Quad 2 sets;10 reps    Long Arc Quad Weight 2 lbs.    Ball Squeeze 2 x 10    Clamshell with TheraBand Green    Hamstring Curl AROM;Strengthening;Both;20 reps    Hamstring Limitations red    Sit to Sand 10 reps;2 sets;without UE support               Balance Exercises - 07/23/20 0001      Balance Exercises: Standing   Rockerboard Anterior/posterior   on blue rocker board, also static balance on rocker both  without UE              PT Short Term Goals - 07/23/20 1020      PT SHORT TERM GOAL #1   Title Pt will be independent with initial HEP.    Baseline Pt given initial HEP during evaluation 06/22/2020    Time 4    Period Weeks    Status Achieved    Target Date 07/20/20      PT  SHORT TERM GOAL #2   Title Pt will improve Berg Balance Score from 31/56 to >/= 35/56 to decrease fall risk.    Baseline 06/22/2020: 31/56    Time 4    Period Weeks    Status Unable to assess      PT SHORT TERM GOAL #3   Title Pt will ambulate  250' on level ground without requiring seated rest break or having LOB to allow for improved community ambulation.    Baseline 572 ft on 07/23/20    Period Weeks    Status Achieved    Target Date 07/20/20      PT SHORT TERM GOAL #4   Title Pt will be able to perform sit <> stand without use of BUE for support.    Baseline can perfrom 2 sets of 10    Time 4    Period Weeks    Status Achieved      PT SHORT TERM GOAL #5   Title Pt's FOTO score will improve from 57% limitation to 53% limitation or better.    Baseline 57% limited; predicted 49% limitation. 07/20/2020: 58% limited; predicted 49% limitation    Time 4    Period Weeks    Status On-going    Target Date 07/20/20             PT Long Term Goals - 07/23/20 1021      PT LONG TERM GOAL #1   Title Pt will be independent with advanced HEP.    Baseline Pt given initial HEP during evaluation 06/22/2020    Time 8    Period Weeks     Status On-going      PT LONG TERM GOAL #2   Title Pt will improve Berg Balance Score from 31/56 to >/= 35/56 to decrease fall risk.    Baseline 06/22/2020: 31/56    Time 8    Period Weeks    Status Unable to assess      PT LONG TERM GOAL #3   Title Pt will ambulate 500' on level ground without requiring seated rest break or having LOB to allow for improved community ambulation.    Baseline 572 feet in 4 minutes on 07/22/20    Time 8    Period Weeks    Status Achieved      PT LONG TERM GOAL #4   Title Pt's RLE MMT will improve to at least 4-/5 grossly for improved functional strength.    Time 8    Period Weeks    Status Unable to assess      PT LONG TERM GOAL #5   Title Pt's FOTO score will improve from 57% limitation to at least 49% limitation or better for improved functional mobility.    Baseline 57% limited; predicted 49% limitation    Time 8    Period Weeks    Status On-going                 Plan - 07/23/20 1013    Clinical Impression Statement pt arrived without AD and holding husband's hand into clinic. She was able to ambulate in clinic without AD and SBA for 572 feet in 4 minutes. STG# 3 and LTG#3 met.  Her husband reports and improvement in gait endurance and balance with ADLs. She is still a fall risk given her last BERG and refuses to use AD so recommended she continue with HHA from husband in community for now. Worked on strength and balance with  good tolerance and endurance to therex noted today. Will reassess BERG next.    PT Next Visit Plan Reassess BERG balance score .Review and update HEP, ankle mobility due to decreased DF (gastroc stretch, band exercises, manual)    PT Home Exercise Plan [id number] - long arc quad, seated marches, standing march, hip abduction, sit-stand, heel raises, seated toe raise, seated HS stretch, seated calf stretch with strap and/or standing calf stretch,    Consulted and Agree with Plan of Care Patient;Family member/caregiver     Family Member Consulted spouse - [name]           Patient will benefit from skilled therapeutic intervention in order to improve the following deficits and impairments:  Abnormal gait,Decreased activity tolerance,Decreased balance,Decreased coordination,Decreased strength,Impaired tone,Pain,Postural dysfunction,Improper body mechanics,Decreased range of motion,Difficulty walking,Decreased endurance  Visit Diagnosis: Muscle weakness (generalized)  Other lack of coordination  Unsteadiness on feet  Other abnormalities of gait and mobility  Abnormal posture     Problem List Patient Active Problem List   Diagnosis Date Noted  . CAD (coronary artery disease) 09/04/2013  . HTN (hypertension) 09/04/2013  . HLD (hyperlipidemia) 09/04/2013  . Tobacco abuse 09/04/2013    Dorene Ar, PTA 07/23/2020, 10:23 AM  [facility] [address] [place], [place], [address] Phone: [phone]   Fax:  [phone]  Name: AVNOOR KOURY MRN: [id number] Date of Birth: September 07, 1933

## 2020-07-27 ENCOUNTER — Ambulatory Visit: Payer: Medicare Other

## 2020-07-27 ENCOUNTER — Other Ambulatory Visit: Payer: Self-pay

## 2020-07-27 DIAGNOSIS — R2689 Other abnormalities of gait and mobility: Secondary | ICD-10-CM

## 2020-07-27 DIAGNOSIS — R278 Other lack of coordination: Secondary | ICD-10-CM | POA: Diagnosis not present

## 2020-07-27 DIAGNOSIS — R293 Abnormal posture: Secondary | ICD-10-CM | POA: Diagnosis not present

## 2020-07-27 DIAGNOSIS — M6281 Muscle weakness (generalized): Secondary | ICD-10-CM

## 2020-07-27 DIAGNOSIS — R2681 Unsteadiness on feet: Secondary | ICD-10-CM

## 2020-07-27 NOTE — Therapy (Addendum)
[facility] [address] [place], [place], [address] Phone: [phone]   Fax:  [phone]  Physical Therapy Treatment/Re-evaluation  Patient Details  Name: Kim Sutton MRN: [id number] Date of Birth: 05-29-1934 Referring Provider (Kim Sutton): Merlene LaughterStoneking, Hal, MD    Encounter Date: 07/27/2020   Kim Sutton End of Session - 07/27/20 0911    Visit Number 8    Number of Visits 24   Date for Kim Sutton Re-Evaluation 09/26/20    Authorization Type Medicare - FOTO at visit 10; PN visit 10, KX modifier visit 15    Kim Sutton Start Time 0915    Kim Sutton Stop Time 0958    Kim Sutton Time Calculation (min) 43 min    Equipment Utilized During Treatment Gait belt   gait belt during gait training   Activity Tolerance Patient tolerated treatment well    Behavior During Therapy [facility] for tasks assessed/performed           Past Medical History:  Diagnosis Date  . Adrenal cortical adenoma of left adrenal gland    stable since 2009;  MRI in 04/2010 - no further scans  . CAD (coronary artery disease)    a. s/p NSTEMI in 2001 tx with BMS to RCA  . Colon polyps   . HLD (hyperlipidemia)   . Hx of cardiovascular stress test    Lexiscan Myoview (09/2013):  No ischemia, EF 77%, Low Risk.  Marland Kitchen. Hyperglycemia   . Hypertension   . Myoclonus    [facility]  . Nephrolithiasis   . Nodule of right lung    a. 7mm on CXR 10/2009;  CT 05/2010 ok - no further scans  . Tobacco abuse     Past Surgical History:  Procedure Laterality Date  . CHOLECYSTECTOMY    . CORONARY ANGIOPLASTY WITH STENT PLACEMENT    . TOTAL ABDOMINAL HYSTERECTOMY      There were no vitals filed for this visit.   Subjective Assessment - 07/27/20 0911    Subjective "My legs feel wobbly today. They are both sore, but I haven't even done anything today." Kim Sutton reports cramping and weakness in BLE that is greater in RLE. She shows that the wound on L lower leg is healing from where she hit her leg on the car  door but states she still has soreness in the area.    Patient is accompained by: Family member   husband - Kim Sutton   Pertinent History CAD, HTN, HLD. (In chart from previous POC 2019: h/o transverse myelitis;  (husband reports Rt sided weakness is due to Kim Sutton having had lesion on spine due to shingles approx. 3 yrs ago)    Limitations Standing;Walking;Sitting;House hold activities;Lifting    How long can you sit comfortably? "pain in lower legs when sitting for a while; top part of legs (thighs) hurt when sleeping"    How long can you stand comfortably? 10-15 minutes before legs get tired    How long can you walk comfortably? 10-15 minutes before legs get tired    Patient Stated Goals "Just walk around without pain"    Currently in Pain? Yes    Pain Score 6     Pain Location Leg    Pain Orientation Right;Left    Pain Descriptors / Indicators Sore    Pain Type Chronic pain    Pain Onset More than a month ago              [facility] Kim Sutton Assessment - 07/27/20  0001      Assessment   Medical Diagnosis Unspecified abnormalities of gait and mobility R26.9    Referring Provider (Kim Sutton) Lajean Manes, MD       Berg Balance Test   Sit to Stand Able to stand without using hands and stabilize independently    Standing Unsupported Able to stand safely 2 minutes    Sitting with Back Unsupported but Feet Supported on Floor or Stool Able to sit safely and securely 2 minutes    Stand to Sit Sits safely with minimal use of hands    Transfers Able to transfer safely, definite need of hands    Standing Unsupported with Eyes Closed Able to stand 10 seconds safely    Standing Unsupported with Feet Together Able to place feet together independently and stand 1 minute safely    From Standing, Reach Forward with Outstretched Arm Can reach forward >12 cm safely (5")    From Standing Position, Pick up Object from Floor Able to pick up shoe safely and easily   slowly but able to do safely   From Standing Position, Turn  to Look Behind Over each Shoulder Looks behind from both sides and weight shifts well    Turn 360 Degrees Able to turn 360 degrees safely but slowly    Standing Unsupported, Alternately Place Feet on Step/Stool Needs assistance to keep from falling or unable to try   performed 1x each leg   Standing Unsupported, One Foot in Front Loses balance while stepping or standing    Standing on One Leg Unable to try or needs assist to prevent fall    Total Score 40                         [facility] Adult Kim Sutton Treatment/Exercise - 07/27/20 0001      Ambulation/Gait   Ambulation/Gait Yes    Ambulation/Gait Assistance 4: Min guard    Ambulation/Gait Assistance Details Gait belt with contact/min guard for safety due to patient report of feeling "extra wobbly" today    Ambulation Distance (Feet) 360 Feet    Assistive device None    Ambulation Surface Level;Indoor      Knee/Hip Exercises: Stretches   Press photographer Both;1 rep;60 seconds    Gastroc Stretch Limitations slant board on minimal incline    Other Knee/Hip Stretches Side lunge to LLE with BUE support at free motion for R hip ADD stretch 5 x 10 seconds      Knee/Hip Exercises: Standing   Hip Flexion Stengthening;Both;15 reps    Hip Abduction Stengthening;Both;15 reps;Knee straight    Hip Extension Stengthening;Both;15 reps;Knee straight      Knee/Hip Exercises: Seated   Long Arc Quad Strengthening;Both;15 reps    Long Arc Quad Limitations LAQ with ball squeeze    Knee/Hip Flexion Seated alternating marches 5x each LE    Hamstring Curl AROM;Strengthening;Right;2 sets;15 reps    Hamstring Limitations Red theraband      Manual Therapy   Manual Therapy Soft tissue mobilization    Manual therapy comments Tennis ball along R hip adductors then had Kim Sutton return demonstration    Soft tissue mobilization STM along R hip adductors and R gastroc-soleus                  Kim Sutton Education - 07/27/20 1003    Education Details Reviewed  use of tennis ball for R hip ADD and lower back musculature in addition to along R calf for  STM and myofascial release. Reviewed previous and current BERG scores and discussed POC to continue functional strengthening for improved safety with ADL.    Person(s) Educated Patient;Spouse   husband   Methods Explanation;Demonstration;Tactile cues;Verbal cues    Comprehension Verbalized understanding;Returned demonstration;Verbal cues required;Tactile cues required;Need further instruction            Kim Sutton Short Term Goals - 07/23/20 1020      Kim Sutton SHORT TERM GOAL #1   Title Kim Sutton will be independent with initial HEP.    Baseline Kim Sutton given initial HEP during evaluation 06/22/2020    Time 4    Period Weeks    Status Achieved    Target Date 07/20/20      Kim Sutton SHORT TERM GOAL #2   Title Kim Sutton will improve Berg Balance Score from 31/56 to >/= 35/56 to decrease fall risk.    Baseline 06/22/2020: 31/56    Time 4    Period Weeks    Status Achieved     Kim Sutton SHORT TERM GOAL #3   Title Kim Sutton will ambulate  250' on level ground without requiring seated rest break or having LOB to allow for improved community ambulation.    Baseline 572 ft on 07/23/20    Period Weeks    Status Achieved    Target Date 07/20/20      Kim Sutton SHORT TERM GOAL #4   Title Kim Sutton will be able to perform sit <> stand without use of BUE for support.    Baseline can perfrom 2 sets of 10    Time 4    Period Weeks    Status Achieved      Kim Sutton SHORT TERM GOAL #5   Title Kim Sutton's FOTO score will improve from 57% limitation to 53% limitation or better.    Baseline 57% limited; predicted 49% limitation. 07/20/2020: 58% limited; predicted 49% limitation    Time 4    Period Weeks    Status On-going    Target Date 07/20/20             Kim Sutton Long Term Goals - 07/27/20 1020      Kim Sutton LONG TERM GOAL #1   Title Kim Sutton will be independent with advanced HEP.    Baseline Kim Sutton given initial HEP during evaluation 06/22/2020    Time 8    Period Weeks    Status  On-going      Kim Sutton LONG TERM GOAL #2   Title Kim Sutton will improve Berg Balance Score from 40/56 to >/= 50/56 to decrease fall risk.    Baseline 06/22/2020: 31/56. 07/27/2020: 40/56.    Time 8    Period Weeks    Status Revised      Kim Sutton LONG TERM GOAL #3   Title Kim Sutton will ambulate 500' on level ground without requiring seated rest break or having LOB to allow for improved community ambulation.    Baseline 572 feet in 4 minutes on 07/22/20    Time 8    Period Weeks    Status Achieved      Kim Sutton LONG TERM GOAL #4   Title Kim Sutton's RLE MMT will improve to at least 4-/5 grossly for improved functional strength.    Time 8    Period Weeks    Status Unable to assess      Kim Sutton LONG TERM GOAL #5   Title Kim Sutton's FOTO score will improve from 57% limitation to at least 49% limitation or better for improved functional mobility.    Baseline 57%  limited; predicted 49% limitation    Time 8    Period Weeks    Status On-going                 Plan - 07/27/20 0912    Clinical Impression Statement Patient had fair tolerance to treatment session with occasional seated rest breaks due to fatigue. Kim Sutton with TTP and tightness in R hip adductors, R lumbar paraspinals, and R quadratus lumborum. She was able to ambulate 360 feet without assistive device with occasional 1 UE assist at handrails in hallway due to fatigue in BLE. She demonstrates improvement in BERG balance score today from 31 to 40 but continues to have difficulty obtaining positions that require increased balance without UE support (alternating placing LE on stool, SLS, tandem stance). She and her husband report noticing an improvement with daily activities despite consistent pain. She explains that she has the most difficulty with stair navigation at home at this time but does fine with sit<>stand and household ambulation. Kim Sutton should continue to benefit from skilled Kim Sutton intervention to allow for increased balance and improved ability to perform daily activities and  ambulate safely in the community.    Personal Factors and Comorbidities Comorbidity 1;Comorbidity 2;Comorbidity 3+;Age;Fitness;Time since onset of injury/illness/exacerbation    Comorbidities CAD, HTN, HLD    Examination-Activity Limitations Locomotion Level;Squat;Stairs;Stand;Carry;Lift;Sit    Stability/Clinical Decision Making Stable/Uncomplicated    Rehab Potential Good    Clinical Impairments Affecting Rehab Potential chronicity of deficits - approx. 3 yrs since onset (due to transverse myelitis?)    Kim Sutton Frequency 2x / week    Kim Sutton Duration 8 weeks    Kim Sutton Treatment/Interventions ADLs/Self Care Home Management;DME Instruction;Gait training;Stair training;Orthotic Fit/Training;Patient/family education;Neuromuscular re-education;Balance training;Therapeutic exercise;Therapeutic activities;Passive range of motion;Moist Heat;Traction;Dry needling;Taping;Manual techniques;Spinal Manipulations;Joint Manipulations;Functional mobility training;Energy conservation    Kim Sutton Next Visit Plan Review and update HEP, ankle mobility due to decreased DF (gastroc stretch, band exercises, manual), R hip ADD stretch, gait training, balance training, step up/down from small step    Kim Sutton Home Exercise Plan [id number] - long arc quad, seated marches, standing march, hip abduction, sit-stand, heel raises, seated toe raise, seated HS stretch, seated calf stretch with strap and/or standing calf stretch, tennis ball use for STM    Consulted and Agree with Plan of Care Patient;Family member/caregiver    Family Member Consulted spouse - Kim Brass           Patient will benefit from skilled therapeutic intervention in order to improve the following deficits and impairments:  Abnormal gait,Decreased activity tolerance,Decreased balance,Decreased coordination,Decreased strength,Impaired tone,Pain,Postural dysfunction,Improper body mechanics,Decreased range of motion,Difficulty walking,Decreased endurance  Visit Diagnosis: Muscle weakness  (generalized)  Other lack of coordination  Unsteadiness on feet  Other abnormalities of gait and mobility  Abnormal posture     Problem List Patient Active Problem List   Diagnosis Date Noted  . CAD (coronary artery disease) 09/04/2013  . HTN (hypertension) 09/04/2013  . HLD (hyperlipidemia) 09/04/2013  . Tobacco abuse 09/04/2013    Kim Sutton, Kim Sutton, Kim Sutton 07/30/20 7:28 PM  [facility] [address] [place], [place], [address] Phone: [phone]   Fax:  [phone]  Name: Kim Sutton MRN: [id number] Date of Birth: [birth year]

## 2020-07-30 ENCOUNTER — Other Ambulatory Visit: Payer: Self-pay

## 2020-07-30 ENCOUNTER — Encounter: Payer: Self-pay | Admitting: Physical Therapy

## 2020-07-30 ENCOUNTER — Ambulatory Visit: Payer: Medicare Other | Admitting: Physical Therapy

## 2020-07-30 DIAGNOSIS — R2681 Unsteadiness on feet: Secondary | ICD-10-CM

## 2020-07-30 DIAGNOSIS — M6281 Muscle weakness (generalized): Secondary | ICD-10-CM

## 2020-07-30 DIAGNOSIS — R2689 Other abnormalities of gait and mobility: Secondary | ICD-10-CM

## 2020-07-30 DIAGNOSIS — R278 Other lack of coordination: Secondary | ICD-10-CM | POA: Diagnosis not present

## 2020-07-30 DIAGNOSIS — R293 Abnormal posture: Secondary | ICD-10-CM

## 2020-07-30 NOTE — Addendum Note (Signed)
Addended by: Rhea Bleacher B on: 07/30/2020 07:33 PM   Modules accepted: Orders

## 2020-07-30 NOTE — Therapy (Addendum)
[place], [place], [address] Phone: [phone]   Fax:  [phone]  Physical Therapy Treatment/Discharge Summary  Patient Details  Name: Kim Sutton MRN: [id number] Date of Birth: 1933-10-23 Referring Provider (PT): Lajean Manes, MD    Encounter Date: 07/30/2020   PT End of Session - 07/30/20 1240    Visit Number 9    Number of Visits 24   Date for PT Re-Evaluation 09/26/20    Authorization Type Medicare - FOTO at visit 10; PN visit 10, KX modifier visit 15    Authorization Time Period none    PT Start Time 3846    PT Stop Time 1313    PT Time Calculation (min) 38 min           Past Medical History:  Diagnosis Date  . Adrenal cortical adenoma of left adrenal gland    stable since 2009;  MRI in 04/2010 - no further scans  . CAD (coronary artery disease)    a. s/p NSTEMI in 2001 tx with BMS to RCA  . Colon polyps   . HLD (hyperlipidemia)   . Hx of cardiovascular stress test    Lexiscan Myoview (09/2013):  No ischemia, EF 77%, Low Risk.  Marland Kitchen Hyperglycemia   . Hypertension   . Myoclonus    on neurontin per neuro at [facility]  . Nephrolithiasis   . Nodule of right lung    a. 73m on CXR 10/2009;  CT 05/2010 ok - no further scans  . Tobacco abuse     Past Surgical History:  Procedure Laterality Date  . CHOLECYSTECTOMY    . CORONARY ANGIOPLASTY WITH STENT PLACEMENT    . TOTAL ABDOMINAL HYSTERECTOMY      There were no vitals filed for this visit.   Subjective Assessment - 07/30/20 1238    Subjective Pt reports she did not sleep wll last night.    Currently in Pain? Yes    Pain Location Leg    Pain Orientation Right    Pain Descriptors / Indicators --   weak   Pain Type Chronic pain    Aggravating Factors  lifting right leg, activity makes legs tired, too much activity    Pain Relieving Factors topical cream, tylenol                             [facility] Adult PT  Treatment/Exercise - 07/30/20 0001      Knee/Hip Exercises: Aerobic   Nustep L4 x 5 min LE only      Knee/Hip Exercises: Standing   Hip Flexion 10 reps;Right;Left    Hip Flexion Limitations 2#    Hip Abduction Stengthening;Both;2 sets;10 reps;Knee straight    Abduction Limitations 2#    Hip Extension 10 reps;2 sets;Right;Left    Extension Limitations 2#    Forward Step Up Step Height: 4";10 reps;Hand Hold: 1      Knee/Hip Exercises: Seated   Long Arc Quad Strengthening;Both;15 reps    Long Arc Quad Weight 3 lbs.    Long Arc Quad Limitations LAQ with ball squeeze    Knee/Hip Flexion Seated alternating marches 5x each LE    Hamstring Curl AROM;Strengthening;Right;2 sets;15 reps    Hamstring Limitations Red theraband                    PT Short Term Goals - 07/23/20 1020      PT SHORT TERM  GOAL #1   Title Pt will be independent with initial HEP.    Baseline Pt given initial HEP during evaluation 06/22/2020    Time 4    Period Weeks    Status Achieved    Target Date 07/20/20      PT SHORT TERM GOAL #2   Title Pt will improve Berg Balance Score from 31/56 to >/= 35/56 to decrease fall risk.    Baseline 06/22/2020: 31/56    Time 4    Period Weeks    Status Unable to assess      PT SHORT TERM GOAL #3   Title Pt will ambulate  250' on level ground without requiring seated rest break or having LOB to allow for improved community ambulation.    Baseline 572 ft on 07/23/20    Period Weeks    Status Achieved    Target Date 07/20/20      PT SHORT TERM GOAL #4   Title Pt will be able to perform sit <> stand without use of BUE for support.    Baseline can perfrom 2 sets of 10    Time 4    Period Weeks    Status Achieved      PT SHORT TERM GOAL #5   Title Pt's FOTO score will improve from 57% limitation to 53% limitation or better.    Baseline 57% limited; predicted 49% limitation. 07/20/2020: 58% limited; predicted 49% limitation    Time 4    Period Weeks     Status On-going    Target Date 07/20/20             PT Long Term Goals - 07/27/20 1020      PT LONG TERM GOAL #1   Title Pt will be independent with advanced HEP.    Baseline Pt given initial HEP during evaluation 06/22/2020    Time 8    Period Weeks    Status On-going      PT LONG TERM GOAL #2   Title Pt will improve Berg Balance Score from 40/56 to >/= 50/56 to decrease fall risk.    Baseline 06/22/2020: 31/56. 07/27/2020: 40/56.    Time 8    Period Weeks    Status Revised      PT LONG TERM GOAL #3   Title Pt will ambulate 500' on level ground without requiring seated rest break or having LOB to allow for improved community ambulation.    Baseline 572 feet in 4 minutes on 07/22/20    Time 8    Period Weeks    Status Achieved      PT LONG TERM GOAL #4   Title Pt's RLE MMT will improve to at least 4-/5 grossly for improved functional strength.    Time 8    Period Weeks    Status Unable to assess      PT LONG TERM GOAL #5   Title Pt's FOTO score will improve from 57% limitation to at least 49% limitation or better for improved functional mobility.    Baseline 57% limited; predicted 49% limitation    Time 8    Period Weeks    Status On-going                 Plan - 07/30/20 1314    Clinical Impression Statement Pt arrived reporting fatigue due to not sleeping well. Began 4 inch step ups with cues to control right knee to prevent snapping. She is able to step  up with 1 UE assist.    PT Next Visit Plan Review and update HEP, ankle mobility due to decreased DF (gastroc stretch, band exercises, manual), R hip ADD stretch, gait training, balance training, step up/down from small step    PT Home Exercise Plan [id number] - long arc quad, seated marches, standing march, hip abduction, sit-stand, heel raises, seated toe raise, seated HS stretch, seated calf stretch with strap and/or standing calf stretch, tennis ball use for STM           Patient will benefit from  skilled therapeutic intervention in order to improve the following deficits and impairments:  Abnormal gait,Decreased activity tolerance,Decreased balance,Decreased coordination,Decreased strength,Impaired tone,Pain,Postural dysfunction,Improper body mechanics,Decreased range of motion,Difficulty walking,Decreased endurance  Visit Diagnosis: Muscle weakness (generalized)  Other lack of coordination  Unsteadiness on feet  Other abnormalities of gait and mobility  Abnormal posture     Problem List Patient Active Problem List   Diagnosis Date Noted  . CAD (coronary artery disease) 09/04/2013  . HTN (hypertension) 09/04/2013  . HLD (hyperlipidemia) 09/04/2013  . Tobacco abuse 09/04/2013   PHYSICAL THERAPY DISCHARGE SUMMARY  Visits from Start of Care: 9  Current functional level related to goals / functional outcomes: See above   Remaining deficits: See above   Education / Equipment: See above  Plan: Patient agrees to discharge.  Patient goals were partially met. Patient is being discharged due to a change in medical status.  ?????     Patient cancelled appointments due to upcoming hand surgery (is now s/p left wrist volar cyst excision, left carpal tunnel release, and injection left STT joint)  Haydee Monica, PT, DPT 09/15/20 11:11 AM   Dorene Ar, PTA 07/30/2020, 1:16 PM  [facility] [address] [place], [place], [address] Phone: [phone]   Fax:  [phone]  Name: Kim Sutton MRN: [id number] Date of Birth: 1933-08-10

## 2020-08-13 DIAGNOSIS — I10 Essential (primary) hypertension: Secondary | ICD-10-CM | POA: Diagnosis not present

## 2020-08-13 DIAGNOSIS — M810 Age-related osteoporosis without current pathological fracture: Secondary | ICD-10-CM | POA: Diagnosis not present

## 2020-08-13 DIAGNOSIS — G8929 Other chronic pain: Secondary | ICD-10-CM | POA: Diagnosis not present

## 2020-08-13 DIAGNOSIS — J449 Chronic obstructive pulmonary disease, unspecified: Secondary | ICD-10-CM | POA: Diagnosis not present

## 2020-08-13 DIAGNOSIS — E7800 Pure hypercholesterolemia, unspecified: Secondary | ICD-10-CM | POA: Diagnosis not present

## 2020-08-13 DIAGNOSIS — K219 Gastro-esophageal reflux disease without esophagitis: Secondary | ICD-10-CM | POA: Diagnosis not present

## 2020-08-13 DIAGNOSIS — I2510 Atherosclerotic heart disease of native coronary artery without angina pectoris: Secondary | ICD-10-CM | POA: Diagnosis not present

## 2020-08-19 ENCOUNTER — Ambulatory Visit: Payer: Medicare Other | Admitting: Physical Therapy

## 2020-08-21 ENCOUNTER — Ambulatory Visit: Payer: Medicare Other | Admitting: Physical Therapy

## 2020-08-25 ENCOUNTER — Ambulatory Visit: Payer: Medicare Other

## 2020-08-27 ENCOUNTER — Encounter: Payer: Medicare Other | Admitting: Physical Therapy

## 2020-08-28 DIAGNOSIS — G8918 Other acute postprocedural pain: Secondary | ICD-10-CM | POA: Diagnosis not present

## 2020-08-28 DIAGNOSIS — M19032 Primary osteoarthritis, left wrist: Secondary | ICD-10-CM | POA: Diagnosis not present

## 2020-08-28 DIAGNOSIS — M71332 Other bursal cyst, left wrist: Secondary | ICD-10-CM | POA: Diagnosis not present

## 2020-08-28 DIAGNOSIS — G5602 Carpal tunnel syndrome, left upper limb: Secondary | ICD-10-CM | POA: Diagnosis not present

## 2020-09-01 ENCOUNTER — Encounter: Payer: Medicare Other | Admitting: Physical Therapy

## 2020-09-03 ENCOUNTER — Encounter: Payer: Medicare Other | Admitting: Physical Therapy

## 2020-09-22 DIAGNOSIS — I2510 Atherosclerotic heart disease of native coronary artery without angina pectoris: Secondary | ICD-10-CM | POA: Diagnosis not present

## 2020-09-22 DIAGNOSIS — E7800 Pure hypercholesterolemia, unspecified: Secondary | ICD-10-CM | POA: Diagnosis not present

## 2020-09-22 DIAGNOSIS — K219 Gastro-esophageal reflux disease without esophagitis: Secondary | ICD-10-CM | POA: Diagnosis not present

## 2020-09-22 DIAGNOSIS — M810 Age-related osteoporosis without current pathological fracture: Secondary | ICD-10-CM | POA: Diagnosis not present

## 2020-09-22 DIAGNOSIS — G8929 Other chronic pain: Secondary | ICD-10-CM | POA: Diagnosis not present

## 2020-09-22 DIAGNOSIS — I10 Essential (primary) hypertension: Secondary | ICD-10-CM | POA: Diagnosis not present

## 2020-09-22 DIAGNOSIS — J449 Chronic obstructive pulmonary disease, unspecified: Secondary | ICD-10-CM | POA: Diagnosis not present

## 2020-09-24 DIAGNOSIS — Z961 Presence of intraocular lens: Secondary | ICD-10-CM | POA: Diagnosis not present

## 2020-09-24 DIAGNOSIS — H5203 Hypermetropia, bilateral: Secondary | ICD-10-CM | POA: Diagnosis not present

## 2020-09-24 DIAGNOSIS — H53002 Unspecified amblyopia, left eye: Secondary | ICD-10-CM | POA: Diagnosis not present

## 2020-09-24 DIAGNOSIS — H353132 Nonexudative age-related macular degeneration, bilateral, intermediate dry stage: Secondary | ICD-10-CM | POA: Diagnosis not present

## 2020-10-28 DIAGNOSIS — J449 Chronic obstructive pulmonary disease, unspecified: Secondary | ICD-10-CM | POA: Diagnosis not present

## 2020-10-28 DIAGNOSIS — I2510 Atherosclerotic heart disease of native coronary artery without angina pectoris: Secondary | ICD-10-CM | POA: Diagnosis not present

## 2020-10-28 DIAGNOSIS — M810 Age-related osteoporosis without current pathological fracture: Secondary | ICD-10-CM | POA: Diagnosis not present

## 2020-10-28 DIAGNOSIS — G8929 Other chronic pain: Secondary | ICD-10-CM | POA: Diagnosis not present

## 2020-10-28 DIAGNOSIS — I10 Essential (primary) hypertension: Secondary | ICD-10-CM | POA: Diagnosis not present

## 2020-10-28 DIAGNOSIS — E7800 Pure hypercholesterolemia, unspecified: Secondary | ICD-10-CM | POA: Diagnosis not present

## 2020-10-28 DIAGNOSIS — K219 Gastro-esophageal reflux disease without esophagitis: Secondary | ICD-10-CM | POA: Diagnosis not present

## 2020-12-02 DIAGNOSIS — J449 Chronic obstructive pulmonary disease, unspecified: Secondary | ICD-10-CM | POA: Diagnosis not present

## 2020-12-02 DIAGNOSIS — I700 Atherosclerosis of aorta: Secondary | ICD-10-CM | POA: Diagnosis not present

## 2020-12-02 DIAGNOSIS — D692 Other nonthrombocytopenic purpura: Secondary | ICD-10-CM | POA: Diagnosis not present

## 2020-12-02 DIAGNOSIS — R070 Pain in throat: Secondary | ICD-10-CM | POA: Diagnosis not present

## 2020-12-02 DIAGNOSIS — E7800 Pure hypercholesterolemia, unspecified: Secondary | ICD-10-CM | POA: Diagnosis not present

## 2020-12-02 DIAGNOSIS — S81812A Laceration without foreign body, left lower leg, initial encounter: Secondary | ICD-10-CM | POA: Diagnosis not present

## 2020-12-02 DIAGNOSIS — I10 Essential (primary) hypertension: Secondary | ICD-10-CM | POA: Diagnosis not present

## 2020-12-14 DIAGNOSIS — I2510 Atherosclerotic heart disease of native coronary artery without angina pectoris: Secondary | ICD-10-CM | POA: Diagnosis not present

## 2020-12-14 DIAGNOSIS — E7800 Pure hypercholesterolemia, unspecified: Secondary | ICD-10-CM | POA: Diagnosis not present

## 2020-12-14 DIAGNOSIS — G8929 Other chronic pain: Secondary | ICD-10-CM | POA: Diagnosis not present

## 2020-12-14 DIAGNOSIS — K219 Gastro-esophageal reflux disease without esophagitis: Secondary | ICD-10-CM | POA: Diagnosis not present

## 2020-12-14 DIAGNOSIS — J449 Chronic obstructive pulmonary disease, unspecified: Secondary | ICD-10-CM | POA: Diagnosis not present

## 2020-12-14 DIAGNOSIS — I10 Essential (primary) hypertension: Secondary | ICD-10-CM | POA: Diagnosis not present

## 2020-12-14 DIAGNOSIS — M810 Age-related osteoporosis without current pathological fracture: Secondary | ICD-10-CM | POA: Diagnosis not present

## 2020-12-29 DIAGNOSIS — B078 Other viral warts: Secondary | ICD-10-CM | POA: Diagnosis not present

## 2020-12-29 DIAGNOSIS — D2262 Melanocytic nevi of left upper limb, including shoulder: Secondary | ICD-10-CM | POA: Diagnosis not present

## 2020-12-29 DIAGNOSIS — L570 Actinic keratosis: Secondary | ICD-10-CM | POA: Diagnosis not present

## 2020-12-29 DIAGNOSIS — D2261 Melanocytic nevi of right upper limb, including shoulder: Secondary | ICD-10-CM | POA: Diagnosis not present

## 2020-12-29 DIAGNOSIS — L821 Other seborrheic keratosis: Secondary | ICD-10-CM | POA: Diagnosis not present

## 2020-12-29 DIAGNOSIS — D692 Other nonthrombocytopenic purpura: Secondary | ICD-10-CM | POA: Diagnosis not present

## 2020-12-29 DIAGNOSIS — D225 Melanocytic nevi of trunk: Secondary | ICD-10-CM | POA: Diagnosis not present

## 2020-12-29 DIAGNOSIS — D485 Neoplasm of uncertain behavior of skin: Secondary | ICD-10-CM | POA: Diagnosis not present

## 2021-01-14 DIAGNOSIS — R070 Pain in throat: Secondary | ICD-10-CM | POA: Diagnosis not present

## 2021-01-14 DIAGNOSIS — K219 Gastro-esophageal reflux disease without esophagitis: Secondary | ICD-10-CM | POA: Diagnosis not present

## 2021-01-20 DIAGNOSIS — K219 Gastro-esophageal reflux disease without esophagitis: Secondary | ICD-10-CM | POA: Diagnosis not present

## 2021-01-20 DIAGNOSIS — I10 Essential (primary) hypertension: Secondary | ICD-10-CM | POA: Diagnosis not present

## 2021-01-20 DIAGNOSIS — G8929 Other chronic pain: Secondary | ICD-10-CM | POA: Diagnosis not present

## 2021-01-20 DIAGNOSIS — J449 Chronic obstructive pulmonary disease, unspecified: Secondary | ICD-10-CM | POA: Diagnosis not present

## 2021-01-20 DIAGNOSIS — I2510 Atherosclerotic heart disease of native coronary artery without angina pectoris: Secondary | ICD-10-CM | POA: Diagnosis not present

## 2021-01-20 DIAGNOSIS — M810 Age-related osteoporosis without current pathological fracture: Secondary | ICD-10-CM | POA: Diagnosis not present

## 2021-01-20 DIAGNOSIS — E7800 Pure hypercholesterolemia, unspecified: Secondary | ICD-10-CM | POA: Diagnosis not present

## 2021-02-16 DIAGNOSIS — K219 Gastro-esophageal reflux disease without esophagitis: Secondary | ICD-10-CM | POA: Diagnosis not present

## 2021-02-16 DIAGNOSIS — I10 Essential (primary) hypertension: Secondary | ICD-10-CM | POA: Diagnosis not present

## 2021-02-16 DIAGNOSIS — G8929 Other chronic pain: Secondary | ICD-10-CM | POA: Diagnosis not present

## 2021-02-16 DIAGNOSIS — E7800 Pure hypercholesterolemia, unspecified: Secondary | ICD-10-CM | POA: Diagnosis not present

## 2021-02-16 DIAGNOSIS — M810 Age-related osteoporosis without current pathological fracture: Secondary | ICD-10-CM | POA: Diagnosis not present

## 2021-02-16 DIAGNOSIS — J449 Chronic obstructive pulmonary disease, unspecified: Secondary | ICD-10-CM | POA: Diagnosis not present

## 2021-02-16 DIAGNOSIS — I2510 Atherosclerotic heart disease of native coronary artery without angina pectoris: Secondary | ICD-10-CM | POA: Diagnosis not present

## 2021-02-24 DIAGNOSIS — R070 Pain in throat: Secondary | ICD-10-CM | POA: Diagnosis not present

## 2021-02-24 DIAGNOSIS — K219 Gastro-esophageal reflux disease without esophagitis: Secondary | ICD-10-CM | POA: Diagnosis not present

## 2021-04-26 DIAGNOSIS — E7800 Pure hypercholesterolemia, unspecified: Secondary | ICD-10-CM | POA: Diagnosis not present

## 2021-04-26 DIAGNOSIS — M810 Age-related osteoporosis without current pathological fracture: Secondary | ICD-10-CM | POA: Diagnosis not present

## 2021-04-26 DIAGNOSIS — G8929 Other chronic pain: Secondary | ICD-10-CM | POA: Diagnosis not present

## 2021-04-26 DIAGNOSIS — I2510 Atherosclerotic heart disease of native coronary artery without angina pectoris: Secondary | ICD-10-CM | POA: Diagnosis not present

## 2021-04-26 DIAGNOSIS — I10 Essential (primary) hypertension: Secondary | ICD-10-CM | POA: Diagnosis not present

## 2021-04-26 DIAGNOSIS — J449 Chronic obstructive pulmonary disease, unspecified: Secondary | ICD-10-CM | POA: Diagnosis not present

## 2021-04-26 DIAGNOSIS — K219 Gastro-esophageal reflux disease without esophagitis: Secondary | ICD-10-CM | POA: Diagnosis not present

## 2021-05-31 DIAGNOSIS — I10 Essential (primary) hypertension: Secondary | ICD-10-CM | POA: Diagnosis not present

## 2021-06-11 DIAGNOSIS — Z79899 Other long term (current) drug therapy: Secondary | ICD-10-CM | POA: Diagnosis not present

## 2021-06-11 DIAGNOSIS — Z0000 Encounter for general adult medical examination without abnormal findings: Secondary | ICD-10-CM | POA: Diagnosis not present

## 2021-06-11 DIAGNOSIS — Z23 Encounter for immunization: Secondary | ICD-10-CM | POA: Diagnosis not present

## 2021-06-11 DIAGNOSIS — I10 Essential (primary) hypertension: Secondary | ICD-10-CM | POA: Diagnosis not present

## 2021-06-11 DIAGNOSIS — J449 Chronic obstructive pulmonary disease, unspecified: Secondary | ICD-10-CM | POA: Diagnosis not present

## 2021-06-11 DIAGNOSIS — Z1389 Encounter for screening for other disorder: Secondary | ICD-10-CM | POA: Diagnosis not present

## 2021-06-11 DIAGNOSIS — E7800 Pure hypercholesterolemia, unspecified: Secondary | ICD-10-CM | POA: Diagnosis not present

## 2021-06-11 DIAGNOSIS — R739 Hyperglycemia, unspecified: Secondary | ICD-10-CM | POA: Diagnosis not present

## 2021-06-11 DIAGNOSIS — I700 Atherosclerosis of aorta: Secondary | ICD-10-CM | POA: Diagnosis not present

## 2021-06-30 DIAGNOSIS — I10 Essential (primary) hypertension: Secondary | ICD-10-CM | POA: Diagnosis not present

## 2021-08-16 ENCOUNTER — Encounter (HOSPITAL_BASED_OUTPATIENT_CLINIC_OR_DEPARTMENT_OTHER): Payer: Self-pay | Admitting: Emergency Medicine

## 2021-08-16 ENCOUNTER — Other Ambulatory Visit: Payer: Self-pay

## 2021-08-16 ENCOUNTER — Emergency Department (HOSPITAL_BASED_OUTPATIENT_CLINIC_OR_DEPARTMENT_OTHER): Payer: Medicare Other

## 2021-08-16 ENCOUNTER — Emergency Department (HOSPITAL_BASED_OUTPATIENT_CLINIC_OR_DEPARTMENT_OTHER)
Admission: EM | Admit: 2021-08-16 | Discharge: 2021-08-16 | Disposition: A | Discharge status: Home / Self Care | Payer: Medicare Other | Attending: Emergency Medicine | Admitting: Emergency Medicine

## 2021-08-16 DIAGNOSIS — I2510 Atherosclerotic heart disease of native coronary artery without angina pectoris: Secondary | ICD-10-CM | POA: Insufficient documentation

## 2021-08-16 DIAGNOSIS — I10 Essential (primary) hypertension: Secondary | ICD-10-CM | POA: Insufficient documentation

## 2021-08-16 DIAGNOSIS — M7989 Other specified soft tissue disorders: Secondary | ICD-10-CM

## 2021-08-16 DIAGNOSIS — Z79899 Other long term (current) drug therapy: Secondary | ICD-10-CM | POA: Insufficient documentation

## 2021-08-16 DIAGNOSIS — F17200 Nicotine dependence, unspecified, uncomplicated: Secondary | ICD-10-CM | POA: Diagnosis not present

## 2021-08-16 DIAGNOSIS — Z7982 Long term (current) use of aspirin: Secondary | ICD-10-CM | POA: Diagnosis not present

## 2021-08-16 DIAGNOSIS — R2241 Localized swelling, mass and lump, right lower limb: Secondary | ICD-10-CM | POA: Insufficient documentation

## 2021-08-16 DIAGNOSIS — R2240 Localized swelling, mass and lump, unspecified lower limb: Secondary | ICD-10-CM | POA: Diagnosis not present

## 2021-08-16 MED ORDER — DOXYCYCLINE HYCLATE 100 MG PO CAPS: 100.0000 mg | ORAL_CAPSULE | Freq: Two times a day (BID) | ORAL | 0 refills | Status: DC

## 2021-08-16 NOTE — Discharge Instructions (Addendum)
Discussed your presentation today was consistent with inflammation secondary to arthritis versus early cellulitis.  We will treat you presumptively for both.  Take ibuprofen, Tylenol for pain, swelling.  We will put you in a walking boot to help with pain with walking.  Additionally I will send you in a prescription for antibiotics for the next week.  Please follow-up with the orthopedic doctor whose information I attached above.  Please follow-up with your primary care if you have any concerns about the leg in the meantime.  If you begin having chest pain, shortness of breath, or worsening leg pain especially with numbness please return to the emergency department for further evaluation.  Please use Tylenol or ibuprofen for pain.  You may use 600 mg ibuprofen every 6 hours or 1000 mg of Tylenol every 6 hours.  You may choose to alternate between the 2.  This would be most effective.  Not to exceed 4 g of Tylenol within 24 hours.  Not to exceed 3200 mg ibuprofen 24 hours.  It may be helpful to apply ice to the affected area.

## 2021-08-16 NOTE — ED Provider Notes (Signed)
I personally evaluated the patient during the encounter and completed a history, physical, procedures, medical decision making to contribute to the overall care of the patient and decision making for the patient briefly, the patient is a 88 y.o. female is here with right ankle pain.  History of CAD, high cholesterol, hypertension.  Patient has noticed some pain and swelling to the right ankle area.  She denies any obvious trauma history.  She has good strength and sensation and pulses of her right lower extremity on exam.  Was able to Doppler pulses.  She is got some mild redness to the right shin that extends over the right lateral malleolus.  Does not have much tenderness or range of motion at the right ankle.  But does have tenderness when I palpate the lateral portion of the right foot and ankle.  She is not having any calf tenderness.  No recent surgery or travel.  No history of gout.  Differential diagnosis includes inflammatory process versus sprain or strain.  I have no suspicion for septic joint as patient has no fever, no warmth of the joint.  Possible that she might of suffered some injury without knowing.  I have no concern for peripheral arterial occlusion.  No concern for DVT.  X-ray was obtained and shows no evidence of fracture or dislocation.  Some soft tissue swelling nearby.  Overall conservative will treat for possible cellulitis with antibiotics.  But suspect that this is inflammatory.  We will place her in a Cam walking boot.  Recommend Tylenol and ibuprofen.  We will have her follow-up with sports medicine.  Discharged in good condition.  This chart was dictated using voice recognition software.  Despite best efforts to proofread,  errors can occur which can change the documentation meaning.    EKG Interpretation None            Lennice Sites, DO 08/16/21 1431

## 2021-08-16 NOTE — ED Notes (Signed)
ED Provider at bedside. 

## 2021-08-16 NOTE — ED Notes (Signed)
Right Pedal pulse easily doppled Right Post tib pulse easily doppled

## 2021-08-16 NOTE — ED Triage Notes (Signed)
Right lower leg swelling x 5 days. Pt states its painful.  Pt called md office and was told to go to clinic but came here instead.  No known fevers.

## 2021-08-16 NOTE — ED Provider Notes (Signed)
[facility] EMERGENCY DEPARTMENT Provider Note   CSN: [id number] Arrival date & time: 08/16/21  1220     History  Chief Complaint  Patient presents with   Leg Swelling    Kim Sutton is a 88 y.o. female with a past medical history significant for ACS, hypertension, hypercholesterolemia, history of tobacco use who presents with red, swollen, painful right lower extremity for the last 5 days.  Patient denies any injury to the leg.  Patient denies recent travel.  Patient takes aspirin 81 mg, but does not take any other blood thinners.  Denies previous surgery or hardware of this leg.  She denies any chest pain, shortness of breath, hematemesis, cough.  Denies fever, chills.  Reports some pain with walking.  HPI     Home Medications Prior to Admission medications   Medication Sig Start Date End Date Taking? Authorizing Provider  doxycycline (VIBRAMYCIN) 100 MG capsule Take 1 capsule (100 mg total) by mouth 2 (two) times daily. 08/16/21  Yes Karenann Mcgrory H, PA-C  amLODipine (NORVASC) 5 MG tablet  07/22/13   [provider]  amoxicillin-clavulanate (AUGMENTIN) 875-125 MG tablet Take 1 tablet by mouth every 12 (twelve) hours. 02/13/20   Deno Etienne, DO  aspirin 81 MG tablet Take 324 mg by mouth daily.     [provider]  bacitracin ointment Apply 1 application topically 2 (two) times daily. 07/13/19   Tacy Learn, PA-C  lisinopril-hydrochlorothiazide (PRINZIDE,ZESTORETIC) 20-12.5 MG per tablet  07/22/13   [provider]  metoprolol succinate (TOPROL-XL) 50 MG 24 hr tablet Take 50 mg by mouth daily. Take with or immediately following a meal.    [provider]  morphine (MSIR) 15 MG tablet Take 0.5 tablets (7.5 mg total) by mouth every 4 (four) hours as needed for severe pain. 02/13/20   Deno Etienne, DO  nystatin cream (MYCOSTATIN) Apply to affected area 2 times daily 02/13/20   Deno Etienne, DO  ondansetron (ZOFRAN ODT) 4 MG  disintegrating tablet 4mg  ODT q4 hours prn nausea/vomit 02/13/20   Deno Etienne, DO  simvastatin (ZOCOR) 20 MG tablet Take 20 mg by mouth daily.    [provider]  vitamin B-12 (CYANOCOBALAMIN) 1000 MCG tablet Take by mouth.    [provider]  ZETIA 10 MG tablet  07/22/13   [provider]      Allergies    Ceclor [cefaclor] and Atorvastatin    Review of Systems   Review of Systems  Cardiovascular:  Positive for leg swelling.  All other systems reviewed and are negative.  Physical Exam Updated Vital Signs BP (!) 180/69 (BP Location: Right Arm)    Pulse (!) 52    Temp 97.6 F (36.4 C) (Oral)    Resp 18    Ht 5\' 6"  (1.676 m)    Wt 73.5 kg    SpO2 95%    BMI 26.15 kg/m  Physical Exam Vitals and nursing note reviewed.  Constitutional:      General: She is not in acute distress.    Appearance: Normal appearance.  HENT:     Head: Normocephalic and atraumatic.  Eyes:     General:        Right eye: No discharge.        Left eye: No discharge.  Cardiovascular:     Rate and Rhythm: Normal rate and regular rhythm.     Pulses: Normal pulses.     Comments: Intact DP, PT pulses bilaterally Pulmonary:  Effort: Pulmonary effort is normal. No respiratory distress.  Musculoskeletal:        General: No deformity.  Skin:    General: Skin is warm and dry.     Capillary Refill: Capillary refill takes less than 2 seconds.     Comments: Patient with warm, tender, swollen anterior right lower extremity distal to the calf.  She also has some tenderness to palpation lateral malleolus.  Intact range of motion of the ankle, however some pain with exaggerated dorsiflexion.  Neurological:     Mental Status: She is alert and oriented to person, place, and time.  Psychiatric:        Mood and Affect: Mood normal.        Behavior: Behavior normal.    ED Results / Procedures / Treatments   Labs (all labs ordered are listed, but only abnormal results are displayed) Labs  Reviewed - No data to display  EKG None  Radiology DG Ankle Complete Right  Result Date: 08/16/2021 CLINICAL DATA:  Pain in right lower leg for 5 days. EXAM: RIGHT ANKLE - COMPLETE 3+ VIEW COMPARISON:  None. FINDINGS: There is diffuse soft tissue swelling noted. There is no underlying fracture or dislocation identified. No radio-opaque foreign bodies are soft tissue calcifications. IMPRESSION: 1. Diffuse soft tissue swelling. 2. No acute bone abnormality. Electronically Signed   By: Kerby Moors M.D.   On: 08/16/2021 13:32    Procedures Procedures    Medications Ordered in ED Medications - No data to display  ED Course/ Medical Decision Making/ A&P                           Medical Decision Making  I discussed this case with my attending physician who cosigned this note including patient's presenting symptoms, physical exam, and planned diagnostics and interventions. Attending physician stated agreement with plan or made changes to plan which were implemented.   Attending physician assessed patient at bedside.  Patient with red swollen right anterior shin, right ankle.  She has history of CAD, high cholesterol, hypertension.  Patient does not remember having any trauma to the area.  Good strength, sensation of the right lower extremity.  Pulses found via Doppler.  Differential diagnosis includes inflammation secondary to her arthritis versus cellulitis versus DVT, versus septic joint versus abscess versus osteomyelitis.  Given the physical exam findings, and after discussion with Dr. Ronnald Nian, believe that arthritis versus cellulitis is most likely diagnosis.  I personally obtained and reviewed radiographic imaging of the right lower extremity which shows soft tissue swelling, without any evidence of free air, abscess, or joint effusion.  Agree with radiologist interpretation.  Given somewhat unclear appearance of limb, however with out significant redness, swelling most likely presumed  diagnosis is inflammation secondary to arthritis.  She has good range of motion of the joint, no evidence of joint effusion, minimal clinical concern for septic joint at this time.  Given the extent of redness still some suspicion for cellulitis.  Will treat with anti-inflammatories, immobilization, as well as skin soft tissue infection antibiotics, patient is allergic to Keflex so we will prescribe doxycycline.  Encourage follow-up with orthopedics, as well as PCP.  Patient discharged in stable condition, return precautions given. Final Clinical Impression(s) / ED Diagnoses Final diagnoses:  Right leg swelling    Rx / DC Orders ED Discharge Orders          Ordered    doxycycline (VIBRAMYCIN) 100 MG capsule  2 times daily        08/16/21 1428              Gaylord Seydel, [place], PA-C 08/16/21 1519    Lennice Sites, DO 08/16/21 1538

## 2021-08-30 DIAGNOSIS — I10 Essential (primary) hypertension: Secondary | ICD-10-CM | POA: Diagnosis not present

## 2021-08-30 DIAGNOSIS — E7800 Pure hypercholesterolemia, unspecified: Secondary | ICD-10-CM | POA: Diagnosis not present

## 2021-08-30 DIAGNOSIS — J449 Chronic obstructive pulmonary disease, unspecified: Secondary | ICD-10-CM | POA: Diagnosis not present

## 2021-09-14 ENCOUNTER — Ambulatory Visit
Admission: RE | Admit: 2021-09-14 | Discharge: 2021-09-14 | Disposition: A | Discharge status: Home / Self Care | Payer: Medicare Other | Source: Ambulatory Visit | Attending: Geriatric Medicine | Admitting: Geriatric Medicine

## 2021-09-14 ENCOUNTER — Other Ambulatory Visit: Payer: Self-pay | Admitting: Geriatric Medicine

## 2021-09-14 DIAGNOSIS — M79661 Pain in right lower leg: Secondary | ICD-10-CM | POA: Diagnosis not present

## 2021-09-14 DIAGNOSIS — R600 Localized edema: Secondary | ICD-10-CM | POA: Diagnosis not present

## 2021-09-14 DIAGNOSIS — M79604 Pain in right leg: Secondary | ICD-10-CM

## 2021-09-14 DIAGNOSIS — I10 Essential (primary) hypertension: Secondary | ICD-10-CM | POA: Diagnosis not present

## 2021-10-14 DIAGNOSIS — I872 Venous insufficiency (chronic) (peripheral): Secondary | ICD-10-CM | POA: Diagnosis not present

## 2021-10-14 DIAGNOSIS — I10 Essential (primary) hypertension: Secondary | ICD-10-CM | POA: Diagnosis not present

## 2021-10-14 DIAGNOSIS — M7989 Other specified soft tissue disorders: Secondary | ICD-10-CM | POA: Diagnosis not present

## 2021-10-21 DIAGNOSIS — I872 Venous insufficiency (chronic) (peripheral): Secondary | ICD-10-CM | POA: Diagnosis not present

## 2021-11-03 DIAGNOSIS — I10 Essential (primary) hypertension: Secondary | ICD-10-CM | POA: Diagnosis not present

## 2021-11-03 DIAGNOSIS — J449 Chronic obstructive pulmonary disease, unspecified: Secondary | ICD-10-CM | POA: Diagnosis not present

## 2021-11-03 DIAGNOSIS — K219 Gastro-esophageal reflux disease without esophagitis: Secondary | ICD-10-CM | POA: Diagnosis not present

## 2021-11-03 DIAGNOSIS — M810 Age-related osteoporosis without current pathological fracture: Secondary | ICD-10-CM | POA: Diagnosis not present

## 2021-11-03 DIAGNOSIS — E7800 Pure hypercholesterolemia, unspecified: Secondary | ICD-10-CM | POA: Diagnosis not present

## 2021-12-14 DIAGNOSIS — I10 Essential (primary) hypertension: Secondary | ICD-10-CM | POA: Diagnosis not present

## 2021-12-14 DIAGNOSIS — M5441 Lumbago with sciatica, right side: Secondary | ICD-10-CM | POA: Diagnosis not present

## 2021-12-14 DIAGNOSIS — I872 Venous insufficiency (chronic) (peripheral): Secondary | ICD-10-CM | POA: Diagnosis not present

## 2021-12-21 ENCOUNTER — Emergency Department (HOSPITAL_BASED_OUTPATIENT_CLINIC_OR_DEPARTMENT_OTHER): Payer: Medicare Other

## 2021-12-21 ENCOUNTER — Emergency Department (HOSPITAL_BASED_OUTPATIENT_CLINIC_OR_DEPARTMENT_OTHER)
Admission: EM | Admit: 2021-12-21 | Discharge: 2021-12-21 | Disposition: A | Discharge status: Home / Self Care | Payer: Medicare Other | Attending: Emergency Medicine | Admitting: Emergency Medicine

## 2021-12-21 ENCOUNTER — Encounter (HOSPITAL_BASED_OUTPATIENT_CLINIC_OR_DEPARTMENT_OTHER): Payer: Self-pay

## 2021-12-21 DIAGNOSIS — M25511 Pain in right shoulder: Secondary | ICD-10-CM | POA: Insufficient documentation

## 2021-12-21 DIAGNOSIS — S79911A Unspecified injury of right hip, initial encounter: Secondary | ICD-10-CM | POA: Diagnosis not present

## 2021-12-21 DIAGNOSIS — S0990XA Unspecified injury of head, initial encounter: Secondary | ICD-10-CM | POA: Diagnosis present

## 2021-12-21 DIAGNOSIS — S0003XA Contusion of scalp, initial encounter: Secondary | ICD-10-CM | POA: Diagnosis not present

## 2021-12-21 DIAGNOSIS — Y9279 Other farm location as the place of occurrence of the external cause: Secondary | ICD-10-CM | POA: Insufficient documentation

## 2021-12-21 DIAGNOSIS — M25531 Pain in right wrist: Secondary | ICD-10-CM | POA: Insufficient documentation

## 2021-12-21 DIAGNOSIS — S6991XA Unspecified injury of right wrist, hand and finger(s), initial encounter: Secondary | ICD-10-CM | POA: Diagnosis not present

## 2021-12-21 DIAGNOSIS — Z79899 Other long term (current) drug therapy: Secondary | ICD-10-CM | POA: Insufficient documentation

## 2021-12-21 DIAGNOSIS — M25551 Pain in right hip: Secondary | ICD-10-CM | POA: Diagnosis not present

## 2021-12-21 DIAGNOSIS — S199XXA Unspecified injury of neck, initial encounter: Secondary | ICD-10-CM | POA: Diagnosis not present

## 2021-12-21 DIAGNOSIS — S0083XA Contusion of other part of head, initial encounter: Secondary | ICD-10-CM | POA: Insufficient documentation

## 2021-12-21 DIAGNOSIS — W19XXXA Unspecified fall, initial encounter: Secondary | ICD-10-CM

## 2021-12-21 DIAGNOSIS — Z7982 Long term (current) use of aspirin: Secondary | ICD-10-CM | POA: Insufficient documentation

## 2021-12-21 DIAGNOSIS — S80212A Abrasion, left knee, initial encounter: Secondary | ICD-10-CM | POA: Diagnosis not present

## 2021-12-21 DIAGNOSIS — S8992XA Unspecified injury of left lower leg, initial encounter: Secondary | ICD-10-CM | POA: Diagnosis not present

## 2021-12-21 DIAGNOSIS — W010XXA Fall on same level from slipping, tripping and stumbling without subsequent striking against object, initial encounter: Secondary | ICD-10-CM | POA: Diagnosis not present

## 2021-12-21 DIAGNOSIS — M19031 Primary osteoarthritis, right wrist: Secondary | ICD-10-CM | POA: Diagnosis not present

## 2021-12-21 DIAGNOSIS — M47816 Spondylosis without myelopathy or radiculopathy, lumbar region: Secondary | ICD-10-CM | POA: Diagnosis not present

## 2021-12-21 MED ORDER — OXYCODONE-ACETAMINOPHEN 5-325 MG PO TABS
1.0000 | ORAL_TABLET | Freq: Once | ORAL | Status: AC
  Administered 2021-12-21: 1 via ORAL
  Filled 2021-12-21: qty 1

## 2021-12-21 NOTE — ED Provider Notes (Addendum)
CT head neck without any acute findings.  Clinically patient without concern for ligamentous injury to the cervical spine.  X-rays of wrist shoulder, right hip and pelvis left knee without any acute findings.  And most importantly patient was concerned about her right wrist.  The x-ray of that was negative.  Patient with good neck movement.  Not concerned about ligamentous injury.  Patient stable for discharge home and follow-up.   Fredia Sorrow, MD 12/21/21 1622    Fredia Sorrow, MD 12/21/21 [phone]

## 2021-12-21 NOTE — ED Notes (Signed)
Pt left knee cleaned and bandaged.

## 2021-12-21 NOTE — ED Triage Notes (Signed)
Pt presents to ED from home C/O mechanical fall while picking strawberries. Pt with skin tears to L knee and hematoma to R forehead. Pt also reports pain to R knee and R wrist. Denies blood thinner use.

## 2021-12-21 NOTE — Discharge Instructions (Addendum)
X-rays and CAT scans without any acute injuries.  Take pain medication as needed and as directed.  Make an appointment follow-up with your doctor.

## 2021-12-21 NOTE — ED Provider Notes (Signed)
[facility] EMERGENCY DEPARTMENT Provider Note   CSN: [id number] Arrival date & time: 12/21/21  1352     History  Chief Complaint  Patient presents with   Kim Sutton    Kim Sutton is a 88 y.o. female.  Presenting to ER due to concern for fall.  Patient is at strawberry picking farm when she tripped over some sort of a curb and landed on her right side.  Suffered abrasion to her left knee, now is having pain in her right shoulder, right hip.  She also hit her right side of her head, no LOC.  Not on blood thinners.  Pain is mild to moderate, worse with movement improved with rest.  Has not had any pain medicine yet.  HPI     Home Medications Prior to Admission medications   Medication Sig Start Date End Date Taking? Authorizing Provider  amLODipine (NORVASC) 5 MG tablet  07/22/13   [provider]  amoxicillin-clavulanate (AUGMENTIN) 875-125 MG tablet Take 1 tablet by mouth every 12 (twelve) hours. 02/13/20   Deno Etienne, DO  aspirin 81 MG tablet Take 324 mg by mouth daily.     [provider]  bacitracin ointment Apply 1 application topically 2 (two) times daily. 07/13/19   Tacy Learn, PA-C  doxycycline (VIBRAMYCIN) 100 MG capsule Take 1 capsule (100 mg total) by mouth 2 (two) times daily. 08/16/21   Prosperi, Christian H, PA-C  lisinopril-hydrochlorothiazide (PRINZIDE,ZESTORETIC) 20-12.5 MG per tablet  07/22/13   [provider]  metoprolol succinate (TOPROL-XL) 50 MG 24 hr tablet Take 50 mg by mouth daily. Take with or immediately following a meal.    [provider]  morphine (MSIR) 15 MG tablet Take 0.5 tablets (7.5 mg total) by mouth every 4 (four) hours as needed for severe pain. 02/13/20   Deno Etienne, DO  nystatin cream (MYCOSTATIN) Apply to affected area 2 times daily 02/13/20   Deno Etienne, DO  ondansetron (ZOFRAN ODT) 4 MG disintegrating tablet '4mg'$  ODT q4 hours prn nausea/vomit 02/13/20   Deno Etienne, DO  simvastatin (ZOCOR) 20 MG  tablet Take 20 mg by mouth daily.    [provider]  vitamin B-12 (CYANOCOBALAMIN) 1000 MCG tablet Take by mouth.    [provider]  ZETIA 10 MG tablet  07/22/13   [provider]      Allergies    Ceclor [cefaclor] and Atorvastatin    Review of Systems   Review of Systems  Constitutional:  Positive for fatigue. Negative for chills and fever.  HENT:  Negative for ear pain and sore throat.   Eyes:  Negative for pain and visual disturbance.  Respiratory:  Negative for cough and shortness of breath.   Cardiovascular:  Negative for chest pain and palpitations.  Gastrointestinal:  Negative for abdominal pain and vomiting.  Genitourinary:  Negative for dysuria and hematuria.  Musculoskeletal:  Positive for arthralgias. Negative for back pain.  Skin:  Negative for color change and rash.  Neurological:  Positive for headaches. Negative for seizures and syncope.  All other systems reviewed and are negative.  Physical Exam Updated Vital Signs BP (!) 181/83 (BP Location: Left Arm)   Pulse 65   Temp 98.2 F (36.8 C) (Oral)   Resp 18   SpO2 91%  Physical Exam Vitals and nursing note reviewed.  Constitutional:      General: She is not in acute distress.    Appearance: She is well-developed.  HENT:  Head: Normocephalic.     Comments: 1 to 2 cm diameter area of ecchymosis over the right lateral forehead region Eyes:     Conjunctiva/sclera: Conjunctivae normal.  Cardiovascular:     Rate and Rhythm: Normal rate and regular rhythm.     Heart sounds: No murmur heard. Pulmonary:     Effort: Pulmonary effort is normal. No respiratory distress.     Breath sounds: Normal breath sounds.  Abdominal:     Palpations: Abdomen is soft.     Tenderness: There is no abdominal tenderness.  Musculoskeletal:        General: No swelling.     Cervical back: Neck supple.     Comments: Back: no C, T, L spine TTP, no step off or deformity RUE: Some tenderness and swelling  to the right wrist, some tenderness to the right shoulder, normal joint ROM, radial pulse intact, distal sensation and motor intact LUE: no TTP throughout, no deformity, normal joint ROM, radial pulse intact, distal sensation and motor intact RLE: Some tenderness to right hip, no deformity, normal joint ROM, distal pulse, sensation and motor intact LLE: Superficial abrasion to anterior knee, normal joint ROM, distal pulse, sensation and motor intact  Skin:    General: Skin is warm and dry.     Capillary Refill: Capillary refill takes less than 2 seconds.  Neurological:     Mental Status: She is alert.  Psychiatric:        Mood and Affect: Mood normal.    ED Results / Procedures / Treatments   Labs (all labs ordered are listed, but only abnormal results are displayed) Labs Reviewed - No data to display  EKG None  Radiology No results found.  Procedures Procedures    Medications Ordered in ED Medications  oxyCODONE-acetaminophen (PERCOCET/ROXICET) 5-325 MG per tablet 1 tablet (1 tablet Oral Given 12/21/21 1451)    ED Course/ Medical Decision Making/ A&P                           Medical Decision Making Amount and/or Complexity of Data Reviewed Radiology: ordered.  Risk Prescription drug management.   88 year old lady presenting to ER after mechanical fall, no LOC.  Found to have small hematoma to her right forehead, some tenderness to her right shoulder, wrist, hip, left knee.  No wounds requiring suture repair.  Will check CT head, C-spine and plain films of the affected extremities.  While awaiting images and patient reassessment, signed out to Dr. Rogene Houston.  He will follow-up on imaging and reassess patient. Provided dose of some pain meds.        Final Clinical Impression(s) / ED Diagnoses Final diagnoses:  Fall, initial encounter  Facial hematoma, initial encounter  Knee abrasion, left, initial encounter    Rx / DC Orders ED Discharge Orders     None          Lucrezia Starch, MD 12/21/21 1500

## 2022-01-29 IMAGING — DX DG ANKLE COMPLETE 3+V*R*
3 series · 3 of 3 positions shown · non-contrast
Comparison: None.

CLINICAL DATA: Pain in right lower leg for 5 days.

EXAM:
RIGHT ANKLE - COMPLETE 3+ VIEW

[ankle ap]
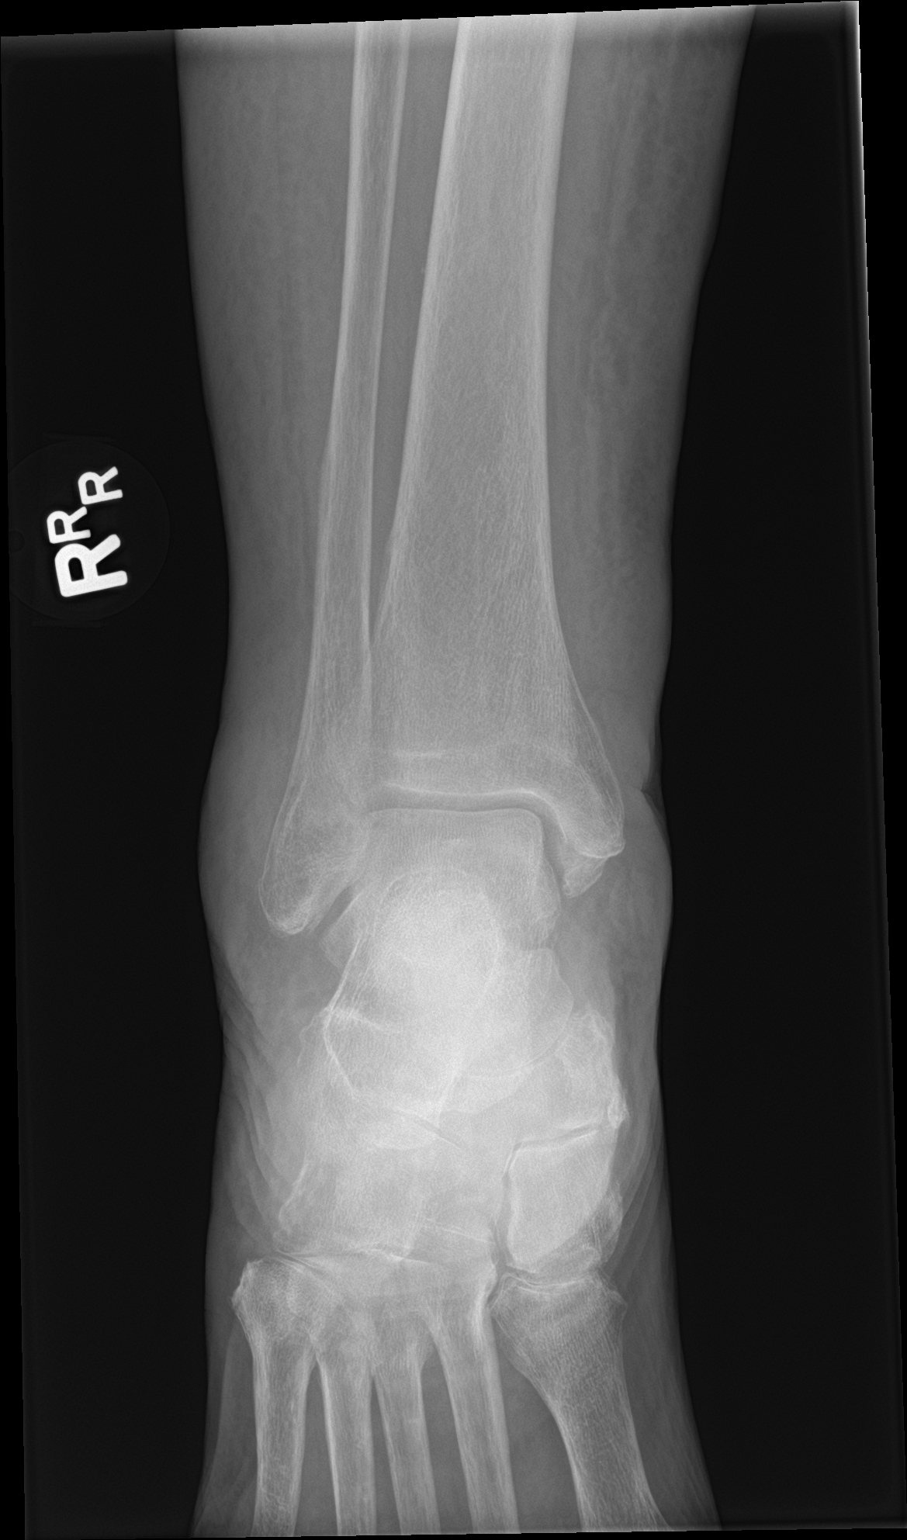

[ankle obl]
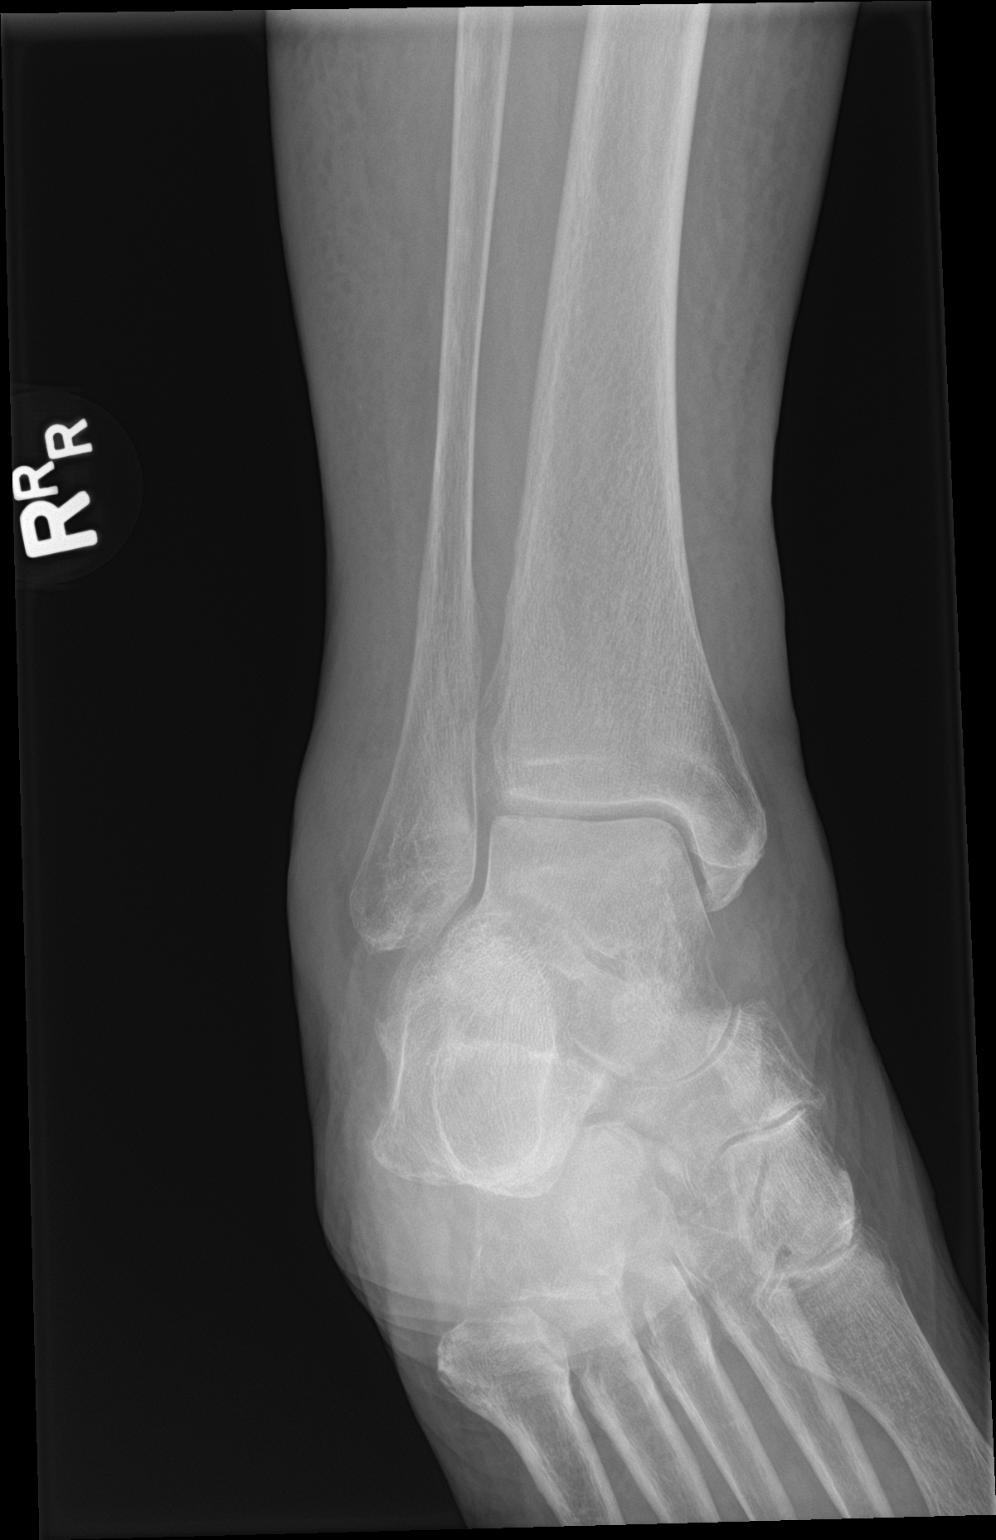

[ankle lat]
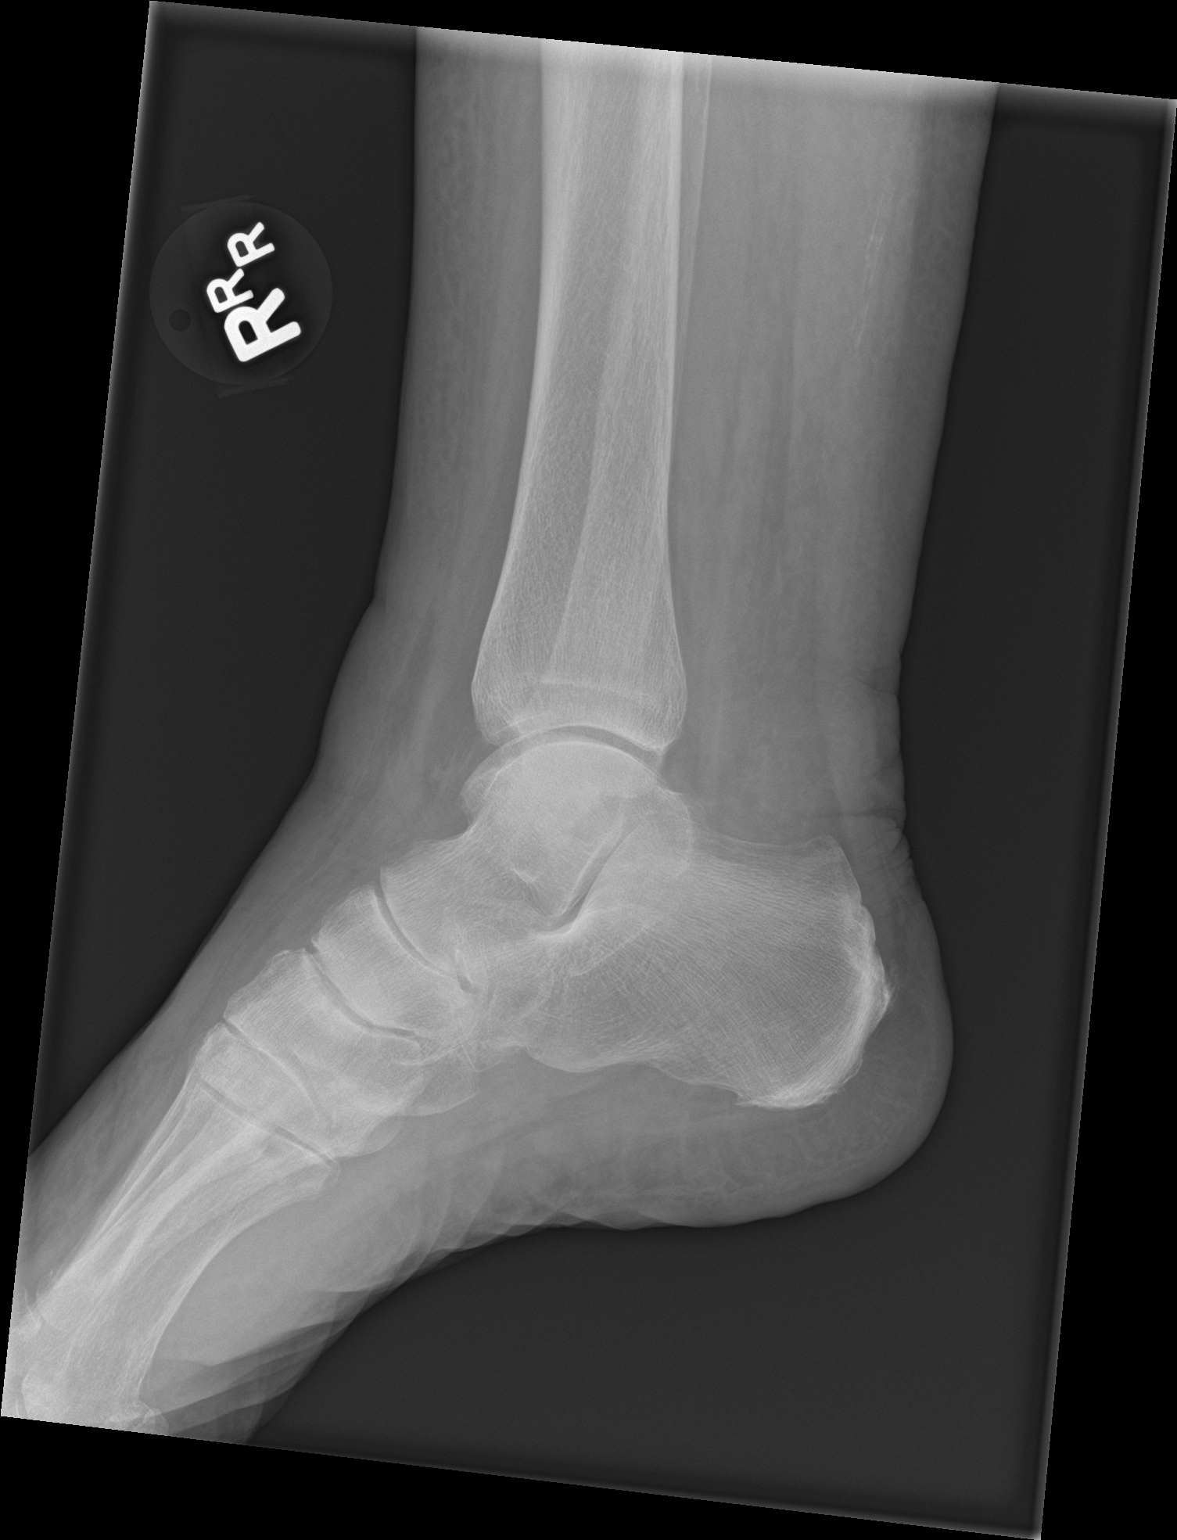

[3 of 3 positions shown; findings below may reference images not displayed]

FINDINGS: There is diffuse soft tissue swelling noted. There is no underlying
fracture or dislocation identified. No radio-opaque foreign bodies
are soft tissue calcifications.
IMPRESSION: 1. Diffuse soft tissue swelling.
2. No acute bone abnormality.

## 2022-03-17 DIAGNOSIS — H353132 Nonexudative age-related macular degeneration, bilateral, intermediate dry stage: Secondary | ICD-10-CM | POA: Diagnosis not present

## 2022-03-17 DIAGNOSIS — Z961 Presence of intraocular lens: Secondary | ICD-10-CM | POA: Diagnosis not present

## 2022-03-17 DIAGNOSIS — H5203 Hypermetropia, bilateral: Secondary | ICD-10-CM | POA: Diagnosis not present

## 2022-03-31 NOTE — Progress Notes (Deleted)
Cardiology Office Note:    Date:  03/31/2022   ID:  Kim Sutton, DOB [birth year], MRN [id number]  PCP:  Lajean Manes, MD  Cardiologist:  None  Electrophysiologist:  None   Referring MD: Lajean Manes, MD   No chief complaint on file. ***  History of Present Illness:    Kim Sutton is a 88 y.o. female with a hx of CAD status post BMS to RCA in 2001, hypertension, hyperlipidemia, tobacco use who is referred by Dr. Felipa Eth for evaluation of CAD.  She previously followed with Dr. Tamala Julian, last seen in 2015.  Past Medical History:  Diagnosis Date   Adrenal cortical adenoma of left adrenal gland    stable since 2009;  MRI in 04/2010 - no further scans   CAD (coronary artery disease)    a. s/p NSTEMI in 2001 tx with BMS to RCA   Colon polyps    HLD (hyperlipidemia)    Hx of cardiovascular stress test    Lexiscan Myoview (09/2013):  No ischemia, EF 77%, Low Risk.   Hyperglycemia    Hypertension    Myoclonus    on neurontin per neuro at [facility]   Nephrolithiasis    Nodule of right lung    a. 17m on CXR 10/2009;  CT 05/2010 ok - no further scans   Tobacco abuse     Past Surgical History:  Procedure Laterality Date   CHOLECYSTECTOMY     CORONARY ANGIOPLASTY WITH STENT PLACEMENT     TOTAL ABDOMINAL HYSTERECTOMY      Current Medications: No outpatient medications have been marked as taking for the 04/01/22 encounter (Appointment) with SDonato Heinz MD.     Allergies:   Ceclor [cefaclor] and Atorvastatin   Social History   Socioeconomic History   Marital status: Married    Spouse name: Not on file   Number of children: Not on file   Years of education: Not on file   Highest education level: Not on file  Occupational History   Not on file  Tobacco Use   Smoking status: Former   Smokeless tobacco: Never  Substance and Sexual Activity   Alcohol use: No   Drug use: Not Currently   Sexual activity: Not on file  Other Topics Concern   Not on file  Social  History Narrative   Not on file   Social Determinants of Health   Financial Resource Strain: Not on file  Food Insecurity: Not on file  Transportation Needs: Not on file  Physical Activity: Not on file  Stress: Not on file  Social Connections: Not on file     Family History: The patient's ***family history is not on file.  ROS:   Please see the history of present illness.    *** All other systems reviewed and are negative.  EKGs/Labs/Other Studies Reviewed:    The following studies were reviewed today: ***  EKG:  EKG is *** ordered today.  The ekg ordered today demonstrates ***  Recent Labs: No results found for requested labs within last 365 days.  Recent Lipid Panel No results found for: "CHOL", "TRIG", "HDL", "CHOLHDL", "VLDL", "LDLCALC", "LDLDIRECT"  Physical Exam:    VS:  There were no vitals taken for this visit.    Wt Readings from Last 3 Encounters:  08/16/21 162 lb (73.5 kg)  02/13/20 170 lb 3.2 oz (77.2 kg)  07/13/19 165 lb (74.8 kg)     GEN: *** Well nourished, well developed in no acute distress  HEENT: Normal NECK: No JVD; No carotid bruits LYMPHATICS: No lymphadenopathy CARDIAC: ***RRR, no murmurs, rubs, gallops RESPIRATORY:  Clear to auscultation without rales, wheezing or rhonchi  ABDOMEN: Soft, non-tender, non-distended MUSCULOSKELETAL:  No edema; No deformity  SKIN: Warm and dry NEUROLOGIC:  Alert and oriented x 3 PSYCHIATRIC:  Normal affect   ASSESSMENT:    No diagnosis found. PLAN:    CAD: status post BMS to RCA in 2001 -Continue aspirin 81 mg daily -Continue simvastatin 20 mg daily -Continue Toprol-XL 50 mg daily  Hypertension: On amlodipine 5 mg daily and lisinopril-HCTZ 20-12.5 mg daily and Toprol-XL 50 mg daily  Hyperlipidemia: On simvastatin 20 mg daily and Zetia 10 mg daily   RTC in***  Medication Adjustments/Labs and Tests Ordered: Current medicines are reviewed at length with the patient today.  Concerns regarding  medicines are outlined above.  No orders of the defined types were placed in this encounter.  No orders of the defined types were placed in this encounter.   There are no Patient Instructions on file for this visit.   Signed, Donato Heinz, MD  03/31/2022 11:00 PM    [place]

## 2022-04-01 ENCOUNTER — Ambulatory Visit: Payer: Medicare Other | Attending: Cardiology | Admitting: Cardiology

## 2022-05-03 NOTE — Progress Notes (Unsigned)
Cardiology Office Note:    Date:  05/04/2022   ID:  Kim Sutton, DOB 1934/06/13, MRN [id number]  PCP:  Lajean Manes, MD  Cardiologist:  None  Electrophysiologist:  None   Referring MD: Lajean Manes, MD   Chief Complaint  Patient presents with   Coronary Artery Disease    History of Present Illness:    Kim Sutton is a 88 y.o. female with a hx of CAD, hypertension, hyperlipidemia, transverse myelitis, tobacco use who is referred by Dr. Felipa Eth for evaluation of CAD.  She had NSTEMI in 2001, treated with BMS to RCA.  Previously followed with Dr. Tamala Julian, last seen in 2015.  She reports that she has been having heaviness in chest when stressed.  Occurs in center of chest, lasts for about 1 minute or so.  She denies any dyspnea, lightheadedness, syncope, or palpitations.  Reports has pain in leg with walking.  Reports has been having swelling in both legs.  She saw Dr. Felipa Eth for this, stopped her amlodipine and statin.  Reports her swelling has improved.  She does not exercise.  Smoked 1 pack/day x 50 years, quit in late 1s.  Family history includes mother had MI in 61s, brother had PCI in 35s, another brother had CABG in 44s.   Past Medical History:  Diagnosis Date   Adrenal cortical adenoma of left adrenal gland    stable since 2009;  MRI in 04/2010 - no further scans   CAD (coronary artery disease)    a. s/p NSTEMI in 2001 tx with BMS to RCA   Colon polyps    HLD (hyperlipidemia)    Hx of cardiovascular stress test    Lexiscan Myoview (09/2013):  No ischemia, EF 77%, Low Risk.   Hyperglycemia    Hypertension    Myoclonus    on neurontin per neuro at [facility]   Nephrolithiasis    Nodule of right lung    a. 11m on CXR 10/2009;  CT 05/2010 ok - no further scans   Tobacco abuse     Past Surgical History:  Procedure Laterality Date   CHOLECYSTECTOMY     CORONARY ANGIOPLASTY WITH STENT PLACEMENT     TOTAL ABDOMINAL HYSTERECTOMY      Current Medications: Current Meds   Medication Sig   aspirin 81 MG tablet Take 324 mg by mouth daily.    metoprolol succinate (TOPROL-XL) 50 MG 24 hr tablet Take 50 mg by mouth daily. Take with or immediately following a meal.   ZETIA 10 MG tablet    [DISCONTINUED] albuterol (VENTOLIN HFA) 108 (90 Base) MCG/ACT inhaler See admin instructions.   [DISCONTINUED] bacitracin ointment Apply 1 application topically 2 (two) times daily.   [DISCONTINUED] lisinopril-hydrochlorothiazide (PRINZIDE,ZESTORETIC) 20-12.5 MG per tablet    [DISCONTINUED] nystatin cream (MYCOSTATIN) Apply to affected area 2 times daily   [DISCONTINUED] SPIRIVA RESPIMAT 2.5 MCG/ACT AERS SMARTSIG:2 Puff(s) Via Inhaler Daily   [DISCONTINUED] vitamin B-12 (CYANOCOBALAMIN) 1000 MCG tablet Take by mouth.     Allergies:   Ceclor [cefaclor] and Atorvastatin   Social History   Socioeconomic History   Marital status: Married    Spouse name: Not on file   Number of children: Not on file   Years of education: Not on file   Highest education level: Not on file  Occupational History   Not on file  Tobacco Use   Smoking status: Former   Smokeless tobacco: Never  Substance and Sexual Activity   Alcohol use: No  Drug use: Not Currently   Sexual activity: Not on file  Other Topics Concern   Not on file  Social History Narrative   Not on file   Social Determinants of Health   Financial Resource Strain: Not on file  Food Insecurity: Not on file  Transportation Needs: Not on file  Physical Activity: Not on file  Stress: Not on file  Social Connections: Not on file     Family History: Family history includes mother had MI in 56s, brother had PCI in 39s, another brother had CABG in 17s.  ROS:   Please see the history of present illness.     All other systems reviewed and are negative.  EKGs/Labs/Other Studies Reviewed:    The following studies were reviewed today:   EKG:   05/04/22: Sinus bradycardia, rate 47, right bundle branch block  Recent  Labs: No results found for requested labs within last 365 days.  Recent Lipid Panel No results found for: "CHOL", "TRIG", "HDL", "CHOLHDL", "VLDL", "LDLCALC", "LDLDIRECT"  Physical Exam:    VS:  BP 126/80   Pulse (!) 47   Ht '5\' 5"'$  (1.651 m)   Wt 173 lb (78.5 kg)   SpO2 97%   BMI 28.79 kg/m     Wt Readings from Last 3 Encounters:  05/04/22 173 lb (78.5 kg)  08/16/21 162 lb (73.5 kg)  02/13/20 170 lb 3.2 oz (77.2 kg)     GEN:  Well nourished, well developed in no acute distress HEENT: Normal NECK: No JVD; No carotid bruits LYMPHATICS: No lymphadenopathy CARDIAC: RRR, no murmurs, rubs, gallops RESPIRATORY:  Clear to auscultation without rales, wheezing or rhonchi  ABDOMEN: Soft, non-tender, non-distended MUSCULOSKELETAL:  No edema; No deformity  SKIN: Warm and dry NEUROLOGIC:  Alert and oriented x 3 PSYCHIATRIC:  Normal affect   ASSESSMENT:    1. CAD in native artery   2. Chest pain of uncertain etiology   3. Leg pain, bilateral   4. Hyperlipidemia, unspecified hyperlipidemia type   5. Essential hypertension    PLAN:    CAD: Status post NSTEMI and BMS to RCA in 2001 -Continue aspirin 81 mg daily -Continue Toprol-XL, reduced dose to 50 mg daily due to bradycardia -Reports was taken off statin 2 months ago.  Check lipid panel, may need to restart low-dose statin -Reports substernal chest pressure when stressed, concerning for angina.  Will evaluate further with a Lexiscan Myoview -Check echocardiogram  Leg pain: Occurs with walking.  Check ABIs  Hypertension: On lisinopril '20mg'$  daily, Toprol-XL 50 mg twice daily.  Appears controlled.  Decrease Toprol-XL to 50 mg daily due to bradycardia.  Check CMET  Hyperlipidemia: Was on rosuvastatin 5 mg daily.  LDL 49 on 06/11/2021.  Stopped taking rosuvastatin 2 months ago, currently on zetia 10 mg daily.  Check lipid panel  RTC in 3 months  Shared Decision Making/Informed Consent The risks [chest pain, shortness of breath,  cardiac arrhythmias, dizziness, blood pressure fluctuations, myocardial infarction, stroke/transient ischemic attack, nausea, vomiting, allergic reaction, radiation exposure, metallic taste sensation and life-threatening complications (estimated to be 1 in 10,000)], benefits (risk stratification, diagnosing coronary artery disease, treatment guidance) and alternatives of a nuclear stress test were discussed in detail with Ms. Zion and she agrees to proceed.   Medication Adjustments/Labs and Tests Ordered: Current medicines are reviewed at length with the patient today.  Concerns regarding medicines are outlined above.  Orders Placed This Encounter  Procedures   Comprehensive metabolic panel   CBC  Lipid panel   MYOCARDIAL PERFUSION IMAGING   EKG 12-Lead   ECHOCARDIOGRAM COMPLETE   VAS [place] ABI WITH/WO TBI   VAS [place] LOWER EXTREMITY ARTERIAL DUPLEX   No orders of the defined types were placed in this encounter.   Patient Instructions  Medication Instructions:  DECREASE metoprolol succinate (Toprol XL) to 50 mg once daily  *If you need a refill on your cardiac medications before your next appointment, please call your pharmacy*   Lab Work: CMET, CBC, Lipid today  If you have labs (blood work) drawn today and your tests are completely normal, you will receive your results only by: [place] (if you have MyChart) OR A paper copy in the mail If you have any lab test that is abnormal or we need to change your treatment, we will call you to review the results.   Testing/Procedures: Your physician has requested that you have a lexiscan myoview. For further information please visit [email or web address]. Please follow instruction sheet, as given. This will take place at [address]  How to prepare for your Myocardial Perfusion Test: Do not eat or drink 3 hours prior to your test, except you may have water. Do not consume products containing caffeine (regular or  decaffeinated) 12 hours prior to your test. (ex: coffee, chocolate, sodas, tea). Do bring a list of your current medications with you.  If not listed below, you may take your medications as normal. Do wear comfortable clothes (no dresses or overalls) and walking shoes, tennis shoes preferred (No heels or open toe shoes are allowed). Do NOT wear cologne, perfume, aftershave, or lotions (deodorant is allowed). The test will take approximately 3 to 4 hours to complete If these instructions are not followed, your test will have to be rescheduled.  Your physician has requested that you have an echocardiogram. Echocardiography is a painless test that uses sound waves to create images of your heart. It provides your doctor with information about the size and shape of your heart and how well your heart's chambers and valves are working. This procedure takes approximately one hour. There are no restrictions for this procedure.  Your physician has requested that you have an ankle brachial index (ABI). During this test an ultrasound and blood pressure cuff are used to evaluate the arteries that supply the arms and legs with blood. Allow thirty minutes for this exam. There are no restrictions or special instructions.  Follow-Up: At [facility], you and your health needs are our priority.  As part of our continuing mission to provide you with exceptional heart care, we have created designated Provider Care Teams.  These Care Teams include your primary Cardiologist (physician) and Advanced Practice Providers (APPs -  Physician Assistants and Nurse Practitioners) who all work together to provide you with the care you need, when you need it.  We recommend signing up for the patient portal called "MyChart".  Sign up information is provided on this After Visit Summary.  MyChart is used to connect with patients for Virtual Visits (Telemedicine).  Patients are able to view lab/test results, encounter notes,  upcoming appointments, etc.  Non-urgent messages can be sent to your provider as well.   To learn more about what you can do with MyChart, go to [email or web address].    Your next appointment:   3 month(s)  The format for your next appointment:   In Person  Provider:   Dr. Gardiner Rhyme       Signed, Doreatha Martin  Gardiner Rhyme, MD  05/04/2022 10:40 AM    [place]

## 2022-05-04 ENCOUNTER — Ambulatory Visit: Payer: Medicare Other | Attending: Cardiology | Admitting: Cardiology

## 2022-05-04 ENCOUNTER — Encounter: Payer: Self-pay | Admitting: Cardiology

## 2022-05-04 VITALS — BP 126/80 | HR 47 | Ht 65.0 in | Wt 173.0 lb

## 2022-05-04 DIAGNOSIS — M79605 Pain in left leg: Secondary | ICD-10-CM | POA: Diagnosis not present

## 2022-05-04 DIAGNOSIS — E785 Hyperlipidemia, unspecified: Secondary | ICD-10-CM | POA: Diagnosis not present

## 2022-05-04 DIAGNOSIS — I2510 Atherosclerotic heart disease of native coronary artery without angina pectoris: Secondary | ICD-10-CM | POA: Diagnosis not present

## 2022-05-04 DIAGNOSIS — M79604 Pain in right leg: Secondary | ICD-10-CM | POA: Diagnosis not present

## 2022-05-04 DIAGNOSIS — I10 Essential (primary) hypertension: Secondary | ICD-10-CM

## 2022-05-04 DIAGNOSIS — R079 Chest pain, unspecified: Secondary | ICD-10-CM

## 2022-05-04 NOTE — Patient Instructions (Signed)
Medication Instructions:  DECREASE metoprolol succinate (Toprol XL) to 50 mg once daily  *If you need a refill on your cardiac medications before your next appointment, please call your pharmacy*   Lab Work: CMET, CBC, Lipid today  If you have labs (blood work) drawn today and your tests are completely normal, you will receive your results only by: [place] (if you have MyChart) OR A paper copy in the mail If you have any lab test that is abnormal or we need to change your treatment, we will call you to review the results.   Testing/Procedures: Your physician has requested that you have a lexiscan myoview. For further information please visit [email or web address]. Please follow instruction sheet, as given. This will take place at [address], [address]  How to prepare for your Myocardial Perfusion Test: Do not eat or drink 3 hours prior to your test, except you may have water. Do not consume products containing caffeine (regular or decaffeinated) 12 hours prior to your test. (ex: coffee, chocolate, sodas, tea). Do bring a list of your current medications with you.  If not listed below, you may take your medications as normal. Do wear comfortable clothes (no dresses or overalls) and walking shoes, tennis shoes preferred (No heels or open toe shoes are allowed). Do NOT wear cologne, perfume, aftershave, or lotions (deodorant is allowed). The test will take approximately 3 to 4 hours to complete If these instructions are not followed, your test will have to be rescheduled.  Your physician has requested that you have an echocardiogram. Echocardiography is a painless test that uses sound waves to create images of your heart. It provides your doctor with information about the size and shape of your heart and how well your heart's chambers and valves are working. This procedure takes approximately one hour. There are no restrictions for this procedure.  Your physician has requested  that you have an ankle brachial index (ABI). During this test an ultrasound and blood pressure cuff are used to evaluate the arteries that supply the arms and legs with blood. Allow thirty minutes for this exam. There are no restrictions or special instructions.  Follow-Up: At [facility], you and your health needs are our priority.  As part of our continuing mission to provide you with exceptional heart care, we have created designated Provider Care Teams.  These Care Teams include your primary Cardiologist (physician) and Advanced Practice Providers (APPs -  Physician Assistants and Nurse Practitioners) who all work together to provide you with the care you need, when you need it.  We recommend signing up for the patient portal called "MyChart".  Sign up information is provided on this After Visit Summary.  MyChart is used to connect with patients for Virtual Visits (Telemedicine).  Patients are able to view lab/test results, encounter notes, upcoming appointments, etc.  Non-urgent messages can be sent to your provider as well.   To learn more about what you can do with MyChart, go to [email or web address].    Your next appointment:   3 month(s)  The format for your next appointment:   In Person  Provider:   Dr. Gardiner Rhyme

## 2022-05-05 ENCOUNTER — Other Ambulatory Visit: Payer: Self-pay | Admitting: *Deleted

## 2022-05-05 LAB — LIPID PANEL
Chol/HDL Ratio: 3.6 ratio (ref 0.0–4.4)
Cholesterol, Total: 198 mg/dL (ref 100–199)
HDL: 55 mg/dL (ref >39)
LDL Chol Calc (NIH): 123 mg/dL — ABNORMAL HIGH (ref 0–99)
Triglycerides: 111 mg/dL (ref 0–149)
VLDL Cholesterol Cal: 20 mg/dL (ref 5–40)

## 2022-05-05 LAB — COMPREHENSIVE METABOLIC PANEL
ALT: 16 IU/L (ref 0–32)
AST: 16 IU/L (ref 0–40)
Albumin/Globulin Ratio: 2.2 (ref 1.2–2.2)
Albumin: 4.4 g/dL (ref 3.7–4.7)
Alkaline Phosphatase: 38 IU/L — ABNORMAL LOW (ref 44–121)
BUN/Creatinine Ratio: 19 (ref 12–28)
BUN: 16 mg/dL (ref 8–27)
Bilirubin Total: 0.5 mg/dL (ref 0.0–1.2)
CO2: 28 mmol/L (ref 20–29)
Calcium: 9.5 mg/dL (ref 8.7–10.3)
Chloride: 101 mmol/L (ref 96–106)
Creatinine, Ser: 0.86 mg/dL (ref 0.57–1.00)
Globulin, Total: 2 g/dL (ref 1.5–4.5)
Glucose: 116 mg/dL — ABNORMAL HIGH (ref 70–99)
Potassium: 4.6 mmol/L (ref 3.5–5.2)
Sodium: 140 mmol/L (ref 134–144)
Total Protein: 6.4 g/dL (ref 6.0–8.5)
eGFR: 66 mL/min/{1.73_m2} (ref >59)

## 2022-05-05 LAB — CBC
Hematocrit: 38.7 % (ref 34.0–46.6)
Hemoglobin: 13.4 g/dL (ref 11.1–15.9)
MCH: 31.8 pg (ref 26.6–33.0)
MCHC: 34.6 g/dL (ref 31.5–35.7)
MCV: 92 fL (ref 79–97)
Platelets: 199 10*3/uL (ref 150–450)
RBC: 4.21 x10E6/uL (ref 3.77–5.28)
RDW: 13 % (ref 11.7–15.4)
WBC: 5.7 10*3/uL (ref 3.4–10.8)

## 2022-05-05 MED ORDER — ROSUVASTATIN CALCIUM 5 MG PO TABS: 5.0000 mg | ORAL_TABLET | Freq: Every day | ORAL | 3 refills | Status: AC

## 2022-05-16 ENCOUNTER — Telehealth (HOSPITAL_COMMUNITY): Payer: Self-pay | Admitting: *Deleted

## 2022-05-16 ENCOUNTER — Encounter (HOSPITAL_BASED_OUTPATIENT_CLINIC_OR_DEPARTMENT_OTHER): Payer: Self-pay

## 2022-05-16 ENCOUNTER — Encounter (HOSPITAL_COMMUNITY): Payer: Self-pay | Admitting: *Deleted

## 2022-05-16 ENCOUNTER — Emergency Department (HOSPITAL_BASED_OUTPATIENT_CLINIC_OR_DEPARTMENT_OTHER)
Admission: EM | Admit: 2022-05-16 | Discharge: 2022-05-16 | Disposition: A | Discharge status: Home / Self Care | Payer: Medicare Other | Attending: Emergency Medicine | Admitting: Emergency Medicine

## 2022-05-16 ENCOUNTER — Emergency Department (HOSPITAL_BASED_OUTPATIENT_CLINIC_OR_DEPARTMENT_OTHER): Payer: Medicare Other

## 2022-05-16 ENCOUNTER — Other Ambulatory Visit: Payer: Self-pay

## 2022-05-16 DIAGNOSIS — W19XXXA Unspecified fall, initial encounter: Secondary | ICD-10-CM

## 2022-05-16 DIAGNOSIS — M25561 Pain in right knee: Secondary | ICD-10-CM | POA: Diagnosis not present

## 2022-05-16 DIAGNOSIS — I2510 Atherosclerotic heart disease of native coronary artery without angina pectoris: Secondary | ICD-10-CM | POA: Insufficient documentation

## 2022-05-16 DIAGNOSIS — I10 Essential (primary) hypertension: Secondary | ICD-10-CM | POA: Insufficient documentation

## 2022-05-16 DIAGNOSIS — S8002XA Contusion of left knee, initial encounter: Secondary | ICD-10-CM | POA: Diagnosis not present

## 2022-05-16 DIAGNOSIS — S8001XA Contusion of right knee, initial encounter: Secondary | ICD-10-CM | POA: Diagnosis not present

## 2022-05-16 DIAGNOSIS — M25551 Pain in right hip: Secondary | ICD-10-CM | POA: Diagnosis not present

## 2022-05-16 DIAGNOSIS — S6991XA Unspecified injury of right wrist, hand and finger(s), initial encounter: Secondary | ICD-10-CM | POA: Diagnosis present

## 2022-05-16 DIAGNOSIS — Z7982 Long term (current) use of aspirin: Secondary | ICD-10-CM | POA: Diagnosis not present

## 2022-05-16 DIAGNOSIS — M19031 Primary osteoarthritis, right wrist: Secondary | ICD-10-CM | POA: Diagnosis not present

## 2022-05-16 DIAGNOSIS — S63501A Unspecified sprain of right wrist, initial encounter: Secondary | ICD-10-CM | POA: Insufficient documentation

## 2022-05-16 DIAGNOSIS — Z79899 Other long term (current) drug therapy: Secondary | ICD-10-CM | POA: Diagnosis not present

## 2022-05-16 DIAGNOSIS — M25562 Pain in left knee: Secondary | ICD-10-CM | POA: Diagnosis not present

## 2022-05-16 DIAGNOSIS — S8000XA Contusion of unspecified knee, initial encounter: Secondary | ICD-10-CM

## 2022-05-16 DIAGNOSIS — R609 Edema, unspecified: Secondary | ICD-10-CM | POA: Diagnosis not present

## 2022-05-16 DIAGNOSIS — W010XXA Fall on same level from slipping, tripping and stumbling without subsequent striking against object, initial encounter: Secondary | ICD-10-CM | POA: Diagnosis not present

## 2022-05-16 MED ORDER — ACETAMINOPHEN 325 MG PO TABS
650.0000 mg | ORAL_TABLET | Freq: Once | ORAL | Status: AC
  Administered 2022-05-16: 650 mg via ORAL
  Filled 2022-05-16: qty 2

## 2022-05-16 NOTE — ED Triage Notes (Signed)
Pt arrived via EMS. Reported that pt tripped over a wire planter. Fell forward and Landed on both knees and hands. Pt complaining of right leg/hip pain. Bilateral knee pain and right wrist pain. No shortening or rotation noted to legs. No LOC. Pt did not hit head  Takes ASA daily

## 2022-05-16 NOTE — Discharge Instructions (Signed)
Take over-the-counter medications as needed for pain and discomfort.  The x-rays fortunately did not show any signs of broken bones or dislocation.  May feel stiff and sore for the next week or so.  Try applying ice to help with the swelling and discomfort.  Follow-up with your primary care doctor or an orthopedic doctor to be rechecked if the symptoms persist

## 2022-05-16 NOTE — ED Provider Notes (Signed)
[place] EMERGENCY DEPARTMENT Provider Note   CSN: [id number] Arrival date & time: 05/16/22  1730     History  Chief Complaint  Patient presents with   Lytle Michaels    Kim Sutton is a 88 y.o. female.   Fall  Patient has history of coronary artery disease, hypertension, hyperlipidemia, hyperglycemia status post coronary angioplasty with stents, total abdominal hysterectomy and cholecystectomy. Patient presents to the ED for evaluation after a fall.  Patient states she tripped over a wire plantar and ended up falling forward on both of her knees and hands.  Since then she has had pain in her right wrist, both knees as well as her upper leg on the right side going up towards her hip.  She did not hit her head.  She did not lose consciousness.  She denies any pain elsewhere    Home Medications Prior to Admission medications   Medication Sig Start Date End Date Taking? Authorizing Provider  aspirin 81 MG tablet Take 324 mg by mouth daily.    Yes [provider]  rosuvastatin (CRESTOR) 5 MG tablet Take 1 tablet (5 mg total) by mouth daily. 05/05/22 04/30/23  Donato Heinz, MD  lisinopril (ZESTRIL) 20 MG tablet Take 20 mg by mouth daily.    [provider]  metoprolol succinate (TOPROL-XL) 50 MG 24 hr tablet Take 50 mg by mouth daily. Take with or immediately following a meal.    [provider]  ZETIA 10 MG tablet  07/22/13   [provider]      Allergies    Ceclor [cefaclor] and Atorvastatin    Review of Systems   Review of Systems  Physical Exam Updated Vital Signs BP (!) 157/66   Pulse (!) 54   Temp 97.6 F (36.4 C)   Resp 19   Ht 1.651 m ('5\' 5"'$ )   Wt 81 kg   SpO2 95%   BMI 29.72 kg/m  Physical Exam Vitals and nursing note reviewed.  Constitutional:      General: She is not in acute distress.    Appearance: She is well-developed.  HENT:     Head: Normocephalic and atraumatic.     Right Ear: External ear  normal.     Left Ear: External ear normal.  Eyes:     General: No scleral icterus.       Right eye: No discharge.        Left eye: No discharge.     Conjunctiva/sclera: Conjunctivae normal.  Neck:     Trachea: No tracheal deviation.  Cardiovascular:     Rate and Rhythm: Normal rate.  Pulmonary:     Effort: Pulmonary effort is normal. No respiratory distress.     Breath sounds: No stridor.  Abdominal:     General: There is no distension.  Musculoskeletal:        General: No swelling or deformity.     Right wrist: Tenderness present. No swelling or deformity.     Cervical back: Neck supple.     Right hip: No deformity or tenderness. Normal range of motion.     Right knee: No swelling. Normal range of motion. Tenderness present.     Left knee: No swelling. Normal range of motion. Tenderness present.     Right lower leg: No edema.     Left lower leg: No edema.     Comments: No pain with range of motion of bilateral lower extremities, patient able to extend at the  hip without pain or discomfort  Skin:    General: Skin is warm and dry.     Findings: No rash.  Neurological:     Mental Status: She is alert.     Cranial Nerves: Cranial nerve deficit: no gross deficits.     ED Results / Procedures / Treatments   Labs (all labs ordered are listed, but only abnormal results are displayed) Labs Reviewed - No data to display  EKG None  Radiology DG Wrist Complete Right  Result Date: 05/16/2022 CLINICAL DATA:  Fall.  Pain. EXAM: RIGHT WRIST - COMPLETE 3+ VIEW COMPARISON:  Right wrist radiographs 12/21/2021 FINDINGS: There is diffuse decreased bone mineralization. There is unchanged chronic minimal irregularity within the tip of the ulnar styloid, chronic. Punctate density just lateral to the proximal trapezium is also unchanged and chronic. Mild-to-moderate triscaphe, thumb carpometacarpal, and index finger carpometacarpal joint space narrowing and peripheral degenerative spurring  osteoarthritis. No definite acute fracture is seen.  No dislocation. IMPRESSION: 1. Within the limitation of diffuse decreased bone mineralization, no acute fracture is seen. 2. Mild-to-moderate triscaphe, thumb carpometacarpal, and index finger carpometacarpal osteoarthritis. Electronically Signed   By: Yvonne Kendall M.D.   On: 05/16/2022 18:29   DG Hip Unilat With Pelvis 2-3 Views Right  Result Date: 05/16/2022 CLINICAL DATA:  Fall.  Pain. EXAM: DG HIP (WITH OR WITHOUT PELVIS) 2-3V RIGHT COMPARISON:  Pelvis and right hip radiographs 12/21/2021 FINDINGS: There is again diffuse decreased bone mineralization. The bilateral sacroiliac, bilateral femoroacetabular, and pubic symphysis joint spaces are maintained. Minimal degenerative irregularity of the pubic symphysis articular surfaces, unchanged. No acute fracture is seen. No dislocation. IMPRESSION: 1. No acute fracture. 2. Diffuse decreased bone mineralization. Electronically Signed   By: Yvonne Kendall M.D.   On: 05/16/2022 18:25   DG Knee 2 Views Right  Result Date: 05/16/2022 CLINICAL DATA:  Fall.  Pain. EXAM: RIGHT KNEE - 1-2 VIEW COMPARISON:  Right knee radiographs 05/12/2019 FINDINGS: There is diffuse decreased bone mineralization. Mild medial cartilage joint space narrowing. Mild chronic enthesopathic change at the quadriceps insertion on the patella. No joint effusion. No acute fracture or dislocation. Mild vascular calcifications. IMPRESSION: Mild medial compartment osteoarthritis. No acute fracture. Electronically Signed   By: Yvonne Kendall M.D.   On: 05/16/2022 18:23   DG Knee 2 Views Left  Result Date: 05/16/2022 CLINICAL DATA:  Fall.  Pain. EXAM: LEFT KNEE - 1-2 VIEW COMPARISON:  Left knee radiographs 12/21/2021 FINDINGS: Mild medial joint space narrowing. Mild to moderate chronic enthesopathic change at the quadriceps insertion on the patella, unchanged. No joint effusion. No acute fracture is seen. No dislocation. IMPRESSION: Mild  medial compartment osteoarthritis. No acute fracture. Electronically Signed   By: Yvonne Kendall M.D.   On: 05/16/2022 18:22    Procedures Procedures    Medications Ordered in ED Medications  acetaminophen (TYLENOL) tablet 650 mg (650 mg Oral Given 05/16/22 1820)    ED Course/ Medical Decision Making/ A&P                           Medical Decision Making Problems Addressed: Contusion of knee, unspecified laterality, initial encounter: acute illness or injury Fall, initial encounter: acute illness or injury Sprain of right wrist, initial encounter: acute illness or injury  Amount and/or Complexity of Data Reviewed Radiology: ordered and independent interpretation performed.  Risk OTC drugs.   Patient presented to the ED for evaluation after a fall.  Mechanical in nature.  No syncope or loss of consciousness.  X-rays fortunately without signs of serious injury.  Patient was able to walk in the ED.  Evaluation and diagnostic testing in the emergency department does not suggest an emergent condition requiring admission or immediate intervention beyond what has been performed at this time.  The patient is safe for discharge and has been instructed to return immediately for worsening symptoms, change in symptoms or any other concerns.        Final Clinical Impression(s) / ED Diagnoses Final diagnoses:  Fall, initial encounter  Sprain of right wrist, initial encounter  Contusion of knee, unspecified laterality, initial encounter    Rx / DC Orders ED Discharge Orders     None         Dorie Rank, MD 05/16/22 1924

## 2022-05-16 NOTE — Telephone Encounter (Signed)
[employer] letter sent outlining instructions for upcoming stress test on 05/23/22 at 10:30.

## 2022-05-19 ENCOUNTER — Ambulatory Visit (HOSPITAL_COMMUNITY)
Admission: RE | Admit: 2022-05-19 | Discharge: 2022-05-19 | Disposition: A | Discharge status: Home / Self Care | Payer: Medicare Other | Source: Ambulatory Visit | Attending: Cardiology | Admitting: Cardiology

## 2022-05-19 DIAGNOSIS — M79605 Pain in left leg: Secondary | ICD-10-CM | POA: Insufficient documentation

## 2022-05-19 DIAGNOSIS — M79604 Pain in right leg: Secondary | ICD-10-CM

## 2022-05-23 ENCOUNTER — Ambulatory Visit (HOSPITAL_BASED_OUTPATIENT_CLINIC_OR_DEPARTMENT_OTHER): Payer: Medicare Other

## 2022-05-23 ENCOUNTER — Ambulatory Visit (HOSPITAL_COMMUNITY): Payer: Medicare Other | Attending: Cardiology

## 2022-05-23 DIAGNOSIS — R079 Chest pain, unspecified: Secondary | ICD-10-CM | POA: Diagnosis not present

## 2022-05-23 LAB — MYOCARDIAL PERFUSION IMAGING
LV dias vol: 48 mL (ref 46–106)
LV sys vol: 14 mL
Nuc Stress EF: 70 %
Peak HR: 67 {beats}/min
Rest HR: 53 {beats}/min
Rest Nuclear Isotope Dose: 10.9 mCi
SDS: 2
SRS: 0
SSS: 2
ST Depression (mm): 0 mm
Stress Nuclear Isotope Dose: 32.1 mCi
TID: 0.88

## 2022-05-23 LAB — ECHOCARDIOGRAM COMPLETE
Area-P 1/2: 3.68 cm2
S' Lateral: 2.4 cm

## 2022-05-23 MED ORDER — REGADENOSON 0.4 MG/5ML IV SOLN
0.4000 mg | Freq: Once | INTRAVENOUS | Status: AC
  Administered 2022-05-23: 0.4 mg via INTRAVENOUS

## 2022-05-23 MED ORDER — TECHNETIUM TC 99M TETROFOSMIN IV KIT
32.1000 | PACK | Freq: Once | INTRAVENOUS | Status: AC | PRN
  Administered 2022-05-23: 32.1 via INTRAVENOUS

## 2022-05-23 MED ORDER — TECHNETIUM TC 99M TETROFOSMIN IV KIT
10.9000 | PACK | Freq: Once | INTRAVENOUS | Status: AC | PRN
  Administered 2022-05-23: 10.9 via INTRAVENOUS

## 2022-05-30 ENCOUNTER — Other Ambulatory Visit: Payer: Self-pay

## 2022-05-30 ENCOUNTER — Emergency Department (HOSPITAL_BASED_OUTPATIENT_CLINIC_OR_DEPARTMENT_OTHER): Payer: Medicare Other

## 2022-05-30 ENCOUNTER — Emergency Department (HOSPITAL_BASED_OUTPATIENT_CLINIC_OR_DEPARTMENT_OTHER)
Admission: EM | Admit: 2022-05-30 | Discharge: 2022-05-30 | Disposition: A | Discharge status: Home / Self Care | Payer: Medicare Other | Attending: Emergency Medicine | Admitting: Emergency Medicine

## 2022-05-30 ENCOUNTER — Encounter (HOSPITAL_BASED_OUTPATIENT_CLINIC_OR_DEPARTMENT_OTHER): Payer: Self-pay

## 2022-05-30 DIAGNOSIS — I10 Essential (primary) hypertension: Secondary | ICD-10-CM | POA: Diagnosis not present

## 2022-05-30 DIAGNOSIS — Z7982 Long term (current) use of aspirin: Secondary | ICD-10-CM | POA: Diagnosis not present

## 2022-05-30 DIAGNOSIS — I2510 Atherosclerotic heart disease of native coronary artery without angina pectoris: Secondary | ICD-10-CM | POA: Diagnosis not present

## 2022-05-30 DIAGNOSIS — M25512 Pain in left shoulder: Secondary | ICD-10-CM

## 2022-05-30 MED ORDER — OXYCODONE-ACETAMINOPHEN 5-325 MG PO TABS: 1.0000 | ORAL_TABLET | Freq: Four times a day (QID) | ORAL | 0 refills | Status: DC | PRN

## 2022-05-30 MED ORDER — DICLOFENAC SODIUM 1 % EX GEL: 2.0000 g | Freq: Four times a day (QID) | CUTANEOUS | 0 refills | Status: AC | PRN

## 2022-05-30 MED ORDER — SENNOSIDES-DOCUSATE SODIUM 8.6-50 MG PO TABS: 1.0000 | ORAL_TABLET | Freq: Every evening | ORAL | 0 refills | Status: AC | PRN

## 2022-05-30 MED ORDER — OXYCODONE-ACETAMINOPHEN 5-325 MG PO TABS
1.0000 | ORAL_TABLET | Freq: Once | ORAL | Status: AC
  Administered 2022-05-30: 1 via ORAL
  Filled 2022-05-30: qty 1

## 2022-05-30 NOTE — ED Notes (Signed)
Pt discharged to home. Discharge instructions have been discussed with patient and/or family members. Pt verbally acknowledges understanding d/c instructions, and endorses comprehension to checkout at registration before leaving.  °

## 2022-05-30 NOTE — ED Triage Notes (Signed)
Pt had a fall on 10/16 and had xr of hips with negative imaging. LT arm/shoulder was injured, but was not addressed. Pt has had worsening pain and is unable to fully move it. Pt points to LT shoulder. Took aspirin at 12am.

## 2022-05-30 NOTE — ED Provider Notes (Signed)
Emergency Department Provider Note   I have reviewed the triage vital signs and the nursing notes.   HISTORY  Chief Complaint Shoulder Pain   HPI Kim Sutton is a 88 y.o. female with past history of CAD, hyperlipidemia, hypertension presents to the emergency department for evaluation of left shoulder pain which is lingering after a fall.  She was seen on 10/16 after a fall with x-rays at that time which were negative.  She has been taking Tylenol, aspirin, Voltaren gel but continues to have severe pain in the left shoulder.  She states that the left shoulder was not imaged during her initial evaluation and has continued to hurt, keeping her up at night.  Pain is worse with range of motion.  No pain into the left elbow or wrist.  No other areas of tenderness.  Past Medical History:  Diagnosis Date   Adrenal cortical adenoma of left adrenal gland    stable since 2009;  MRI in 04/2010 - no further scans   CAD (coronary artery disease)    a. s/p NSTEMI in 2001 tx with BMS to RCA   Colon polyps    HLD (hyperlipidemia)    Hx of cardiovascular stress test    Lexiscan Myoview (09/2013):  No ischemia, EF 77%, Low Risk.   Hyperglycemia    Hypertension    Myoclonus    on neurontin per neuro at [facility]   Nephrolithiasis    Nodule of right lung    a. 51m on CXR 10/2009;  CT 05/2010 ok - no further scans   Tobacco abuse     Review of Systems  Constitutional: No fever/chills Cardiovascular: Denies chest pain. Respiratory: Denies shortness of breath. Gastrointestinal: No abdominal pain. Musculoskeletal: Positive left shoulder pain.  Skin: Negative for rash.   ____________________________________________   PHYSICAL EXAM:  VITAL SIGNS: ED Triage Vitals  Enc Vitals Group     BP 05/30/22 0653 (!) 203/65     Pulse Rate 05/30/22 0653 60     Resp 05/30/22 0653 20     Temp 05/30/22 0653 98 F (36.7 C)     Temp Source 05/30/22 0653 Oral     SpO2 05/30/22 0653 94 %     Weight  05/30/22 0652 173 lb 1 oz (78.5 kg)     Height 05/30/22 0652 '5\' 5"'$  (1.651 m)   Constitutional: Alert and oriented. Well appearing and in no acute distress. Eyes: Conjunctivae are normal.  Head: Atraumatic. Nose: No congestion/rhinnorhea. Mouth/Throat: Mucous membranes are moist.   Neck: No stridor. Cardiovascular: Normal rate, regular rhythm. Good peripheral circulation. Grossly normal heart sounds.   Respiratory: Normal respiratory effort.  No retractions. Lungs CTAB. Gastrointestinal: Soft and nontender. No distention.  Musculoskeletal: Pain with passive and active range of motion of the left shoulder but no clear limit to the range of motion.  Patient reports more pain with abduction beyond 90 degrees.  No tenderness to the left elbow or wrist. Neurologic:  Normal speech and language. No gross focal neurologic deficits are appreciated.  Skin:  Skin is warm, dry and intact. No rash noted.  ____________________________________________  RADIOLOGY  DG Shoulder Left  Result Date: 05/30/2022 CLINICAL DATA:  Pain, recent fall 2 weeks ago EXAM: LEFT SHOULDER - 2+ VIEW COMPARISON:  None Available. FINDINGS: No fracture or dislocation is seen. Small bony spurs [facility] IMPRESSION: No recent fracture or dislocation is seen in left shoulder. Electronically Signed   By: PPrudy FeelerD.  On: 05/30/2022 08:01    ____________________________________________   PROCEDURES  Procedure(s) performed:   Procedures  None  ____________________________________________   INITIAL IMPRESSION / ASSESSMENT AND PLAN / ED COURSE  Pertinent labs & imaging results that were available during my care of the patient were reviewed by me and considered in my medical decision making (see chart for details).   This patient is Presenting for Evaluation of shoulder pain, which does require a range of treatment options, and is a complaint that involves a moderate risk of morbidity and  mortality.  The Differential Diagnoses include fracture, dislocation, rotator cuff injury, septic joint, muscle strain, etc.   I did obtain Additional Historical Information from husband at bedside.   I decided to review pertinent External Data, and in summary patient with ED visit on 10-16 with imaging done but did not include the left shoulder.    Radiologic Tests Ordered, included shoulder x-ray. I independently interpreted the images and agree with radiology interpretation.   Medical Decision Making: Summary:  Patient presents emergency department left shoulder pain after a fall.  Patient with preserved range of motion.  Plan for plain films of the left shoulder but lower suspicion for fracture or dislocation.  Suspect rotator cuff injury as a possibility.  Reevaluation with update and discussion with patient and husband.  Is tolerating a Percocet here fairly well.  She is not driving and does not drive.  I did place her in a sling but advised her to remove it frequently for range of motion exercises to prevent frozen joint.  I did review the PDMP per state law prior to prescribing Percocet.  I will also send her home with constipation medication and topical medicines.  We discussed that I would prefer she only take the Percocet as a last resort, ideally before bed.  We discussed the increased fall risk and confusion risk with this medication as well as constipation side effects.   Disposition: discharge  ____________________________________________  FINAL CLINICAL IMPRESSION(S) / ED DIAGNOSES  Final diagnoses:  Acute pain of left shoulder     NEW OUTPATIENT MEDICATIONS STARTED DURING THIS VISIT:  New Prescriptions   DICLOFENAC SODIUM (VOLTAREN) 1 % GEL    Apply 2 g topically 4 (four) times daily as needed.   OXYCODONE-ACETAMINOPHEN (PERCOCET/ROXICET) 5-325 MG TABLET    Take 1 tablet by mouth every 6 (six) hours as needed for severe pain.   SENNA-DOCUSATE (SENOKOT-S) 8.6-50 MG  TABLET    Take 1 tablet by mouth at bedtime as needed for mild constipation or moderate constipation.    Note:  This document was prepared using Dragon voice recognition software and may include unintentional dictation errors.  Nanda Quinton, MD, [facility]    Fronie Holstein, [facility], MD 05/30/22 [phone]

## 2022-05-30 NOTE — Discharge Instructions (Signed)
You were seen in the emergency department today after a fall with shoulder pain.  There is no broken bone or other obvious bony injury.  We discussed that you could have hurt some of the muscles and ligaments that make up your shoulder.  I have listed the name of a sports medicine physician on your form for follow-up.  Please call to schedule the next available appointment.  I would prefer that you use Tylenol and topical medications to treat your pain.  We discussed that for severe pain you may take something stronger but I would prefer that if you are taking this medicine that you will be mainly at night to help get some sleep as it can cause balance issues and confusion.  This medicine also causes constipation and I have called in some constipation medicine as well which you should begin to take if you are taking Percocet.  You should also know that Percocet will affect your ability to drive a car.  If you do drive, you should not drive while taking Percocet.  Should also note that Percocet contains a small amount of Tylenol (325 mg).  If you are taking additional over-the-counter Tylenol you should keep track of how much you take as if you take more than 4000 mg in a 24-hour period this can cause liver damage.

## 2022-06-09 ENCOUNTER — Encounter: Payer: Self-pay | Admitting: *Deleted

## 2022-06-20 DIAGNOSIS — I10 Essential (primary) hypertension: Secondary | ICD-10-CM | POA: Diagnosis not present

## 2022-06-20 DIAGNOSIS — Z9181 History of falling: Secondary | ICD-10-CM | POA: Diagnosis not present

## 2022-06-20 DIAGNOSIS — G373 Acute transverse myelitis in demyelinating disease of central nervous system: Secondary | ICD-10-CM | POA: Diagnosis not present

## 2022-06-20 DIAGNOSIS — R5381 Other malaise: Secondary | ICD-10-CM | POA: Diagnosis not present

## 2022-06-29 ENCOUNTER — Telehealth: Payer: Self-pay | Admitting: Cardiology

## 2022-06-29 NOTE — Telephone Encounter (Signed)
Called Zakeria Kulzer (husband) he states that pt "has not been feeling well and staying in bed for the las 3 days". She has not taken her medication for 3 days either. She has been fatigued, and achy. She even slid out of bed and couldn't get up.husband and son got her back into bed, no trauma noted. Courtlyn Aki (husband) states that her BP/HR "has been too high" ranging from 117/40 HR 83 to 130/78 HR 88. Informed pt that these numbers are not too high they are in the middle of our normal range. Verbalized understanding. Spoke with Dr Clint Lipps cardiologist, resume all medication and continue to hold lisinopril. Pt usually takes her medications at night so she will resume them tonight. Contact PCP or go to Urgent care to get evaluated. Roxsana Riding (husband) will re-start medications and continue to take and log BP/HR. He will call PCP and discuss he will tell the PCP that we directed him to call and discuss this.

## 2022-06-29 NOTE — Telephone Encounter (Signed)
STAT if HR is under 50 or over 120 (normal HR is 60-100 beats per minute)  What is your heart rate?  Hr 83 , bp 117/40  Hr 88   Do you have a log of your heart rate readings (document readings)?   Do you have any other symptoms? Pt spouse states that pt hr has been high and he is concerned. He denies pt has any other symptoms.

## 2022-07-04 DIAGNOSIS — G373 Acute transverse myelitis in demyelinating disease of central nervous system: Secondary | ICD-10-CM | POA: Diagnosis not present

## 2022-07-04 DIAGNOSIS — G8929 Other chronic pain: Secondary | ICD-10-CM | POA: Diagnosis not present

## 2022-07-04 DIAGNOSIS — M75101 Unspecified rotator cuff tear or rupture of right shoulder, not specified as traumatic: Secondary | ICD-10-CM | POA: Diagnosis not present

## 2022-07-04 DIAGNOSIS — I872 Venous insufficiency (chronic) (peripheral): Secondary | ICD-10-CM | POA: Diagnosis not present

## 2022-07-04 DIAGNOSIS — Z79899 Other long term (current) drug therapy: Secondary | ICD-10-CM | POA: Diagnosis not present

## 2022-07-04 DIAGNOSIS — J449 Chronic obstructive pulmonary disease, unspecified: Secondary | ICD-10-CM | POA: Diagnosis not present

## 2022-07-04 DIAGNOSIS — E7800 Pure hypercholesterolemia, unspecified: Secondary | ICD-10-CM | POA: Diagnosis not present

## 2022-07-04 DIAGNOSIS — I10 Essential (primary) hypertension: Secondary | ICD-10-CM | POA: Diagnosis not present

## 2022-07-04 DIAGNOSIS — M810 Age-related osteoporosis without current pathological fracture: Secondary | ICD-10-CM | POA: Diagnosis not present

## 2022-07-04 DIAGNOSIS — M545 Low back pain, unspecified: Secondary | ICD-10-CM | POA: Diagnosis not present

## 2022-07-04 DIAGNOSIS — R7303 Prediabetes: Secondary | ICD-10-CM | POA: Diagnosis not present

## 2022-07-04 DIAGNOSIS — I700 Atherosclerosis of aorta: Secondary | ICD-10-CM | POA: Diagnosis not present

## 2022-07-04 DIAGNOSIS — Z1331 Encounter for screening for depression: Secondary | ICD-10-CM | POA: Diagnosis not present

## 2022-07-04 DIAGNOSIS — Z23 Encounter for immunization: Secondary | ICD-10-CM | POA: Diagnosis not present

## 2022-07-04 DIAGNOSIS — Z0000 Encounter for general adult medical examination without abnormal findings: Secondary | ICD-10-CM | POA: Diagnosis not present

## 2022-08-08 NOTE — Therapy (Signed)
OUTPATIENT OCCUPATIONAL THERAPY NEURO EVALUATION  Patient Name: Kim Sutton MRN: [id number] DOB:Jan 05, 1934, 89 y.o., female Today's Date: 08/09/2022  PCP: Dr. Louis Matte REFERRING PROVIDER: Kem Boroughs, MD  END OF SESSION:  OT End of Session - 08/09/22 1151     Visit Number 1    Number of Visits 17    Date for OT Re-Evaluation 10/08/22    Authorization Type MCR, [employer] - covered 100%    Progress Note Due on Visit 10    OT Start Time 1017    OT Stop Time 1100    OT Time Calculation (min) 43 min    Activity Tolerance Patient tolerated treatment well    Behavior During Therapy [facility] for tasks assessed/performed             Past Medical History:  Diagnosis Date   Adrenal cortical adenoma of left adrenal gland    stable since 2009;  MRI in 04/2010 - no further scans   CAD (coronary artery disease)    a. s/p NSTEMI in 2001 tx with BMS to RCA   Colon polyps    HLD (hyperlipidemia)    Hx of cardiovascular stress test    Lexiscan Myoview (09/2013):  No ischemia, EF 77%, Low Risk.   Hyperglycemia    Hypertension    Myoclonus    on neurontin per neuro at [facility]   Nephrolithiasis    Nodule of right lung    a. 101m on CXR 10/2009;  CT 05/2010 ok - no further scans   Tobacco abuse    Past Surgical History:  Procedure Laterality Date   CHOLECYSTECTOMY     CORONARY ANGIOPLASTY WITH STENT PLACEMENT     TOTAL ABDOMINAL HYSTERECTOMY     Patient Active Problem List   Diagnosis Date Noted   CAD (coronary artery disease) 09/04/2013   HTN (hypertension) 09/04/2013   HLD (hyperlipidemia) 09/04/2013   Tobacco abuse 09/04/2013    ONSET DATE: 08/05/2022  REFERRING DIAG: G37.3 (ICD-10-CM) - Acute transverse myelitis in demyelinating disease of central nervous system  (History of cervical transverse myelitis resulting in right sided weakness; more weakness on right side now which is likely due to debility and aging and right shoulder and wrist pain after falls (has been evaluated in ER)   Needs strengthening, ROM, endurance, appropriate use of cane)  THERAPY DIAG:  Other lack of coordination  Unsteadiness on feet  Muscle weakness (generalized)  Other symptoms and signs involving the nervous system  Rationale for Evaluation and Treatment: Rehabilitation  SUBJECTIVE:   SUBJECTIVE STATEMENT: My Rt side is weak Pt accompanied by: significant other (husband)   PERTINENT HISTORY: Ms. MMilfordis an 89year old right-handed female with a history of hypertension, hyperlipidemia, coronary artery disease status post stent and cervical transverse myelitis presenting to clinic with chief concern of history of transverse myelitis and multiple falls.  PRECAUTIONS: Fall and Other: stent  WEIGHT BEARING RESTRICTIONS: No  PAIN:  Are you having pain?  Constant pain (generalized) fluctuating body parts, more on Rt side, 5-6/10 average. O.T. will not be addressing directly  FALLS: Has patient fallen in last 6 months? Yes. Number of falls 2  LIVING ENVIRONMENT: Lives with: lives with their spouse Pt lives in 2 story house, but lives on first floor with 6 steps to enter from garage, railing on RFriendlygoing up Has following equipment at home: Single point cane, WEnvironmental consultant- 2 wheeled, Grab bars, and rollator, built in shower seat, grab bars in shower and toilet area  PLOF:  occasional min assist for BADLS in the last year  PATIENT GOALS: increase RUE/hand use  OBJECTIVE:   HAND DOMINANCE: Right  ADLs: Overall ADLs: overall mod I with assist for hair Transfers/ambulation related to ADLs: Eating: uses Lt dominant hand 2-3 years Grooming: dependent for hair, uses Lt hand with brushes teeth UB Dressing: occasional assist to get undressed, can don pullover shirt LB Dressing: mod I w/ slip on shoes, occasional assist for compression socks Toileting: mod I  Bathing: walk in shower and uses grab bars - mod I  Tub Shower transfers: mod I   IADLs: Shopping: does grocery shopping together,  husband drives Light housekeeping: does laundry and washes dishes (husband does heavier cleaning/vacuuming) Meal Prep: husband does most cooking now (last 1-2 years)  Community mobility: pt hasn't driven for about 2 years Medication management: pt does Hydrologist: does some (husband does some)  Handwriting:  Pt reports is ok, husband reports slower   MOBILITY STATUS:  uses cane in community, nothing in the house (uses walker on very bad days)    UPPER EXTREMITY ROM:    LUE AROM WNL's  RUE: shoulder flex approx 50*, abduction 45*, ER approx 50%, IR 75%; elbow, forearm and wrist WFL's. Rt hand movements slightly weaker but can do all intrinsic movement with difficulty   UPPER EXTREMITY MMT:   NT RUE    HAND FUNCTION: Grip strength: Right: 15.2 lbs; Left: 39.9 lbs  COORDINATION: 9 Hole Peg test: Right: 43.45 sec; Left: 47.67 sec  SENSATION: Reports intact  EDEMA: Very mild Rt hand   COGNITION: Overall cognitive status: Within functional limits for tasks assessed and HARD OF HEARING  VISION: Subjective report: my vision is fine Baseline vision: Wears glasses for reading only Visual history: corrective eye surgery    OBSERVATIONS: Hard of hearing, Rt sided weakness   TODAY'S TREATMENT:                                                                                                                              DATE: n/a evaluation only   PATIENT EDUCATION: Education details: OT POC Person educated: Patient and Spouse Education method: Explanation Education comprehension: verbalized understanding  HOME EXERCISE PROGRAM: N/A   GOALS: Goals reviewed with patient? Yes  SHORT TERM GOALS: Target date: 09/09/22  Independent with HEP for Rt shoulder ROM, Rt grip strength, and bilateral coordination Baseline: Goal status: INITIAL  2.  Pt to verbalize understanding with A/E to increase ease with ADLS (LH brush, button hook, etc)  Baseline:  Goal  status: INITIAL  3.  Pt to eat 50% with Rt dominant hand and brush teeth 25% with Rt hand Baseline:  Goal status: INITIAL  4.  Pt to consistently doff pull over shirt I'ly Baseline:  Goal status: INITIAL   LONG TERM GOALS: Target date: 10/08/22  Independent with updated HEP PRN Baseline:  Goal status: INITIAL  2.  Pt to eat 75% with  Rt hand, and perform grooming 50% with Rt hand using A/E prn for brushing hair Baseline:  Goal status: INITIAL  3.  Pt to improve bilateral coordination by 5 sec on 9 hole peg test  Baseline: Rt = 43.45 sec, Lt = 47.67 sec Goal status: INITIAL  4.  Pt to improve Rt hand grip strength by 5 lbs  Baseline: 15.2 lbs Goal status: INITIAL  5.  Pt to improve RUE sh flexion to 70* for greater ease with dressing, grooming and low to mid level reaching Baseline:  Goal status: INITIAL   ASSESSMENT:  CLINICAL IMPRESSION: Patient is a 89 y.o. female who was seen today for occupational therapy evaluation for decline in function and increased Rt sided weakness from cervical transverse myelitis. Pt would benefit from O.T. to increase ease, safety and dominant RUE use in ADLS, improve ROM, coordination, grip strength, and hopefully decrease overall pain  PERFORMANCE DEFICITS: in functional skills including ADLs, IADLs, coordination, dexterity, edema, ROM, strength, pain, flexibility, Fine motor control, Gross motor control, mobility, body mechanics, endurance, decreased knowledge of use of DME, and UE functional use.   IMPAIRMENTS: are limiting patient from ADLs, IADLs, and leisure.   CO-MORBIDITIES: may have co-morbidities  that affects occupational performance. Patient will benefit from skilled OT to address above impairments and improve overall function.  MODIFICATION OR ASSISTANCE TO COMPLETE EVALUATION: No modification of tasks or assist necessary to complete an evaluation.  OT OCCUPATIONAL PROFILE AND HISTORY: Problem focused assessment: Including review  of records relating to presenting problem.  CLINICAL DECISION MAKING: Moderate - several treatment options, min-mod task modification necessary  REHAB POTENTIAL: Fair time since onset and gradual decline over last few years  EVALUATION COMPLEXITY: Low    PLAN:  OT FREQUENCY: 2x/week  OT DURATION: 8 weeks, plus evaluation  PLANNED INTERVENTIONS: self care/ADL training, therapeutic exercise, therapeutic activity, neuromuscular re-education, manual therapy, passive range of motion, functional mobility training, splinting, paraffin, fluidotherapy, moist heat, patient/family education, coping strategies training, and DME and/or AE instructions  RECOMMENDED OTHER SERVICES: none at this time  CONSULTED AND AGREED WITH PLAN OF CARE: Patient and family member/caregiver  PLAN FOR NEXT SESSION: HEP for bilateral hand coordination, Rt grip strength (issue yellow putty)   Hans Eden, OT 08/09/2022, 11:52 AM

## 2022-08-09 ENCOUNTER — Ambulatory Visit: Payer: Medicare Other | Attending: Psychiatry | Admitting: Occupational Therapy

## 2022-08-09 ENCOUNTER — Encounter: Payer: Self-pay | Admitting: Occupational Therapy

## 2022-08-09 ENCOUNTER — Ambulatory Visit: Payer: Medicare Other | Admitting: Physical Therapy

## 2022-08-09 DIAGNOSIS — M6281 Muscle weakness (generalized): Secondary | ICD-10-CM | POA: Insufficient documentation

## 2022-08-09 DIAGNOSIS — R293 Abnormal posture: Secondary | ICD-10-CM

## 2022-08-09 DIAGNOSIS — R2681 Unsteadiness on feet: Secondary | ICD-10-CM | POA: Insufficient documentation

## 2022-08-09 DIAGNOSIS — R2689 Other abnormalities of gait and mobility: Secondary | ICD-10-CM

## 2022-08-09 DIAGNOSIS — R29818 Other symptoms and signs involving the nervous system: Secondary | ICD-10-CM | POA: Insufficient documentation

## 2022-08-09 DIAGNOSIS — R278 Other lack of coordination: Secondary | ICD-10-CM | POA: Diagnosis not present

## 2022-08-09 NOTE — Therapy (Signed)
OUTPATIENT PHYSICAL THERAPY NEURO EVALUATION   Patient Name: Kim Sutton MRN: [id number] DOB:07-20-1934, 89 y.o., female Today's Date: 08/09/2022   PCP: Lajean Manes, MD (retired) REFERRING PROVIDER: Kem Boroughs, MD  END OF SESSION:  PT End of Session - 08/09/22 0930     Visit Number 1    Number of Visits 17   with eval   Date for PT Re-Evaluation 11/01/22    Authorization Type Medicare    Progress Note Due on Visit 10    PT Start Time 0927    PT Stop Time 1010    PT Time Calculation (min) 43 min    Activity Tolerance Patient tolerated treatment well    Behavior During Therapy [facility] for tasks assessed/performed             Past Medical History:  Diagnosis Date   Adrenal cortical adenoma of left adrenal gland    stable since 2009;  MRI in 04/2010 - no further scans   CAD (coronary artery disease)    a. s/p NSTEMI in 2001 tx with BMS to RCA   Colon polyps    HLD (hyperlipidemia)    Hx of cardiovascular stress test    Lexiscan Myoview (09/2013):  No ischemia, EF 77%, Low Risk.   Hyperglycemia    Hypertension    Myoclonus    on neurontin per neuro at [facility]   Nephrolithiasis    Nodule of right lung    a. 71m on CXR 10/2009;  CT 05/2010 ok - no further scans   Tobacco abuse    Past Surgical History:  Procedure Laterality Date   CHOLECYSTECTOMY     CORONARY ANGIOPLASTY WITH STENT PLACEMENT     TOTAL ABDOMINAL HYSTERECTOMY     Patient Active Problem List   Diagnosis Date Noted   CAD (coronary artery disease) 09/04/2013   HTN (hypertension) 09/04/2013   HLD (hyperlipidemia) 09/04/2013   Tobacco abuse 09/04/2013    ONSET DATE: 08/05/2022  REFERRING DIAG: G37.3 (ICD-10-CM) - Acute transverse myelitis in demyelinating disease of central nervous system  THERAPY DIAG:  Muscle weakness (generalized)  Other abnormalities of gait and mobility  Unsteadiness on feet  Abnormal posture  Rationale for Evaluation and Treatment: Rehabilitation  SUBJECTIVE:                                                                                                                                                                                              SUBJECTIVE STATEMENT: Pt [facility] that [facility]  and was able to improve her mobility, ability to navigate stairs, etc. Pt and her husband report an overall gradual decline in function over the past few years with pt having a hard time moving her R side (grabbing things with her R arm and walking up/down steps with her RLE).  Pt accompanied by: self and significant other husband [name]  PERTINENT HISTORY: CAD, hyperlipidemia, hypertension, transverse myelitis  PAIN:  Are you having pain? Yes: NPRS scale: 6/10 Pain location: R side Pain description: sharp Aggravating factors: N/A Relieving factors: OTC pain medication, heating pad  PRECAUTIONS: Fall  WEIGHT BEARING RESTRICTIONS: No  FALLS: Has patient fallen in last 6 months? Yes. Number of falls 2, minor with no injuries; difficulty getting back up (had to have sons lift her with a blanket one time and called EMS the other time)  LIVING ENVIRONMENT: Lives with: lives with their spouse Lives in: House/apartment Stairs: Yes: Internal: 12 steps; on right going up and External: 6 steps; on right going up; can stay on main level of home Has following equipment at home: Single point cane, Walker - 2 wheeled, and [employer] - 4 wheeled  PLOF: Independent with gait, Independent with transfers, and Requires assistive device for independence  PATIENT GOALS: "be able to get up and walk around and do everything I used to do"  OBJECTIVE:   DIAGNOSTIC FINDINGS: None relevant to this POC  COGNITION: Overall cognitive status: Within functional limits for tasks assessed   SENSATION: Light touch: WFL Proprioception: WFL  POSTURE: rounded shoulders, forward head,  increased thoracic kyphosis, and head tilt to the L  CERVICAL ROM:   Active ROM AROM (deg) eval  Flexion 35  Extension 20  Right lateral flexion 5  Left lateral flexion 50  Right rotation 40  Left rotation 70   (Blank rows = not tested)   LOWER EXTREMITY ROM:     Passive  Right Eval Left Eval  Hip flexion Decreased    Hip extension    Hip abduction    Hip adduction    Hip internal rotation    Hip external rotation    Knee flexion    Knee extension Tight HS   Ankle dorsiflexion PF contracture   Ankle plantarflexion    Ankle inversion    Ankle eversion     (Blank rows = not tested)  LOWER EXTREMITY MMT:    MMT Right Eval Left Eval  Hip flexion 3 5  Hip extension    Hip abduction    Hip adduction    Hip internal rotation    Hip external rotation    Knee flexion 3 5  Knee extension 3 5  Ankle dorsiflexion 2- 5  Ankle plantarflexion    Ankle inversion    Ankle eversion    (Blank rows = not tested)  BED MOBILITY:  Independent per pt report  TRANSFERS: Assistive device utilized: Single point cane  Sit to stand: SBA Stand to sit: SBA Chair to chair: SBA Floor:  dependent per pt report  GAIT: Gait pattern: decreased hip/knee flexion- Right, decreased ankle dorsiflexion- Right, circumduction- Right, antalgic, and trunk flexed Distance walked: various clinic distances Assistive device utilized: Single point cane Level of assistance: CGA Comments: per pt she is mod I at home for gait, CGA provided due to some path deviation and gait impairments as noted above  FUNCTIONAL TESTS:    [facility] PT Assessment - 08/09/22 0952       Ambulation/Gait   Gait velocity 32.8 ft  over 14.93 sec = 2.2 ft/sec      Standardized Balance Assessment   Standardized Balance Assessment Timed Up and Go Test;Five Times Sit to Stand    Five times sit to stand comments  25.43      Timed Up and Go Test   TUG Normal TUG    Normal TUG (seconds) 17.44   with SPC             TODAY'S TREATMENT:                                                                                                                              PT Evaluation  PATIENT EDUCATION: Education details: Eval findings, PT POC, OM results Person educated: Patient and Spouse Education method: Explanation and Demonstration Education comprehension: verbalized understanding, returned demonstration, and needs further education  HOME EXERCISE PROGRAM: To be initiated next session  GOALS: Goals reviewed with patient? Yes  SHORT TERM GOALS: Target date: 09/06/2022   Pt will be independent with initial HEP for improved strength, balance, transfers and gait. Baseline: Goal status: INITIAL  2.  Pt will improve 5 x STS to less than or equal to 20 seconds to demonstrate improved functional strength and transfer efficiency.  Baseline: 25.43 sec (1/9) Goal status: INITIAL  3.  Pt will improve gait velocity to at least 2.5 ft/sec for improved gait efficiency and performance at Supervision level  Baseline: 2.2 ft/sec with SPC and CGA (1/9) Goal status: INITIAL  4.  Pt will improve her cervical ROM of R rotation and R lateral flexion >/=10 degrees to demonstrate improved function Baseline: 40 degrees R rotation, 5 degrees R lateral flexion (1/9) Goal status: INITIAL  5.  Pt will be able to ambulate x 150 ft with LRAD at Supervision level Baseline: 35 ft with SPC and CGA (1/9) Goal status: INITIAL   LONG TERM GOALS: Target date: 10/04/2022   Pt will be independent with final HEP for improved strength, balance, transfers and gait. Baseline:  Goal status: INITIAL  2.  Pt will improve 5 x STS to less than or equal to 15 seconds to demonstrate improved functional strength and transfer efficiency.  Baseline: 25.43 sec (1/9) Goal status: INITIAL  3.  Pt will improve gait velocity to at least 2.75 ft/sec for improved gait efficiency and performance at mod I level  Baseline: 2.2 ft/sec with SPC and  CGA (1/9) Goal status: INITIAL  4.  Pt will be able to ambulate x 500 ft with LRAD at mod I level Baseline: 35 ft with [facility] and CGA (1/9) Goal status: INITIAL  5.  Pt will improve normal TUG to less than or equal to 15 seconds for improved functional mobility and decreased fall risk. Baseline: 17.4 sec with SPC (1/9) Goal status: INITIAL  ASSESSMENT:  CLINICAL IMPRESSION: Patient is a 89 year old female referred to Neuro OPPT for transverse myelitis.   Pt's PMH is significant for: CAD, hyperlipidemia, hypertension.  The following deficits were present during the exam: decreased RLE strength and ROM, decreased gait speed, decreased balance, decreased cervical AROM. Based on her 5xSTS score of 25.43 sec, gait speed of 2.2 ft/sec, TUG score of 17.4 sec, and fall history, pt is an increased risk for falls. Pt would benefit from skilled PT to address these impairments and functional limitations to maximize functional mobility independence.  OBJECTIVE IMPAIRMENTS: Abnormal gait, decreased balance, decreased endurance, decreased mobility, difficulty walking, decreased ROM, decreased strength, impaired perceived functional ability, impaired flexibility, impaired UE functional use, improper body mechanics, postural dysfunction, and pain.   ACTIVITY LIMITATIONS: carrying, lifting, bending, squatting, stairs, transfers, and bed mobility  PARTICIPATION LIMITATIONS: driving and community activity  PERSONAL FACTORS: Age, Time since onset of injury/illness/exacerbation, and 1-2 comorbidities:    CAD, hyperlipidemia, hypertensionare also affecting patient's functional outcome.   REHAB POTENTIAL: Good  CLINICAL DECISION MAKING: Stable/uncomplicated  EVALUATION COMPLEXITY: Low  PLAN:  PT FREQUENCY: 2x/week  PT DURATION: 8 weeks  PLANNED INTERVENTIONS: Therapeutic exercises, Therapeutic activity, Neuromuscular re-education, Balance training, Gait training, Patient/Family education, Self Care, Joint  mobilization, Stair training, Vestibular training, Canalith repositioning, Visual/preceptual remediation/compensation, Orthotic/Fit training, DME instructions, Aquatic Therapy, Dry Needling, Electrical stimulation, Spinal mobilization, Cryotherapy, Moist heat, Taping, Manual therapy, and Re-evaluation  PLAN FOR NEXT SESSION: initiate HEP for RLE ROM, strengthening (hip ROM, HS and gastroc stretches, sit to stands), endurance, SciFit vs NuStep, cervical ROM/stretching   Excell Seltzer, PT, DPT, CSRS 08/09/2022, 10:23 AM

## 2022-08-15 NOTE — Progress Notes (Signed)
Cardiology Office Note:    Date:  08/18/2022   ID:  BRYNLEIGH Sutton, DOB [birth year], MRN [id number]  PCP:  Lajean Manes, MD  Cardiologist:  None  Electrophysiologist:  None   Referring MD: Lajean Manes, MD   Chief Complaint  Patient presents with   Coronary Artery Disease    History of Present Illness:    Kim Sutton is a 89 y.o. female with a hx of CAD, hypertension, hyperlipidemia, transverse myelitis, tobacco use who presents for follow-up.  She was referred by Dr. Felipa Eth for evaluation of CAD, initially seen 05/04/2022.  She had NSTEMI in 2001, treated with BMS to RCA.  Previously followed with Dr. Tamala Julian, last seen in 2015.  She reports that she has been having heaviness in chest when stressed.  Occurs in center of chest, lasts for about 1 minute or so.  She denies any dyspnea, lightheadedness, syncope, or palpitations.  Reports has pain in leg with walking.  Reports has been having swelling in both legs.  She saw Dr. Felipa Eth for this, stopped her amlodipine and statin.  Reports her swelling has improved.  She does not exercise.  Smoked 1 pack/day x 50 years, quit in late 49s.  Family history includes mother had MI in 68s, brother had PCI in 25s, another brother had CABG in 59s.  Echocardiogram 05/23/2022 showed normal biventricular function, no significant valvular disease.  Lexiscan Myoview on 05/23/2022 showed normal perfusion, EF 70%.  Since last clinic visit, she reports he has been doing okay.  Reports some indigestion but otherwise denies any chest pain.  Reports dyspnea stable.  She denies any lightheadedness, syncope, lower extremity edema, or palpitations.  Husband reports she had an episode where her heart rate was going fast, lasted for hours and resolved.  She has not had further episodes of this.    Past Medical History:  Diagnosis Date   Adrenal cortical adenoma of left adrenal gland    stable since 2009;  MRI in 04/2010 - no further scans   CAD (coronary  artery disease)    a. s/p NSTEMI in 2001 tx with BMS to RCA   Colon polyps    HLD (hyperlipidemia)    Hx of cardiovascular stress test    Lexiscan Myoview (09/2013):  No ischemia, EF 77%, Low Risk.   Hyperglycemia    Hypertension    Myoclonus    on neurontin per neuro at [facility]   Nephrolithiasis    Nodule of right lung    a. 72m on CXR 10/2009;  CT 05/2010 ok - no further scans   Tobacco abuse     Past Surgical History:  Procedure Laterality Date   CHOLECYSTECTOMY     CORONARY ANGIOPLASTY WITH STENT PLACEMENT     TOTAL ABDOMINAL HYSTERECTOMY      Current Medications: Current Meds  Medication Sig   aspirin 81 MG tablet Take 324 mg by mouth daily.    lisinopril (ZESTRIL) 20 MG tablet Take 20 mg by mouth daily.   metoprolol succinate (TOPROL-XL) 50 MG 24 hr tablet Take 50 mg by mouth daily. Take with or immediately following a meal.   metoprolol tartrate (LOPRESSOR) 50 MG tablet Take 50 mg by mouth 2 (two) times daily.   rosuvastatin (CRESTOR) 5 MG tablet Take 1 tablet (5 mg total) by mouth daily.   SPIRIVA RESPIMAT 2.5 MCG/ACT AERS INHALE 2 PUFFS ONCE DAILY 90 for 30   ZETIA 10 MG tablet      Allergies:   Ceclor [  cefaclor] and Atorvastatin   Social History   Socioeconomic History   Marital status: Married    Spouse name: Not on file   Number of children: Not on file   Years of education: Not on file   Highest education level: Not on file  Occupational History   Not on file  Tobacco Use   Smoking status: Former   Smokeless tobacco: Never  Substance and Sexual Activity   Alcohol use: No   Drug use: Not Currently   Sexual activity: Not on file  Other Topics Concern   Not on file  Social History Narrative   Not on file   Social Determinants of Health   Financial Resource Strain: Not on file  Food Insecurity: Not on file  Transportation Needs: Not on file  Physical Activity: Not on file  Stress: Not on file  Social Connections: Not on file     Family  History: Family history includes mother had MI in 62s, brother had PCI in 56s, another brother had CABG in 51s.  ROS:   Please see the history of present illness.     All other systems reviewed and are negative.  EKGs/Labs/Other Studies Reviewed:    The following studies were reviewed today:   EKG:   05/04/22: Sinus bradycardia, rate 47, right bundle branch block  Recent Labs: 05/04/2022: ALT 16; BUN 16; Creatinine, Ser 0.86; Hemoglobin 13.4; Platelets 199; Potassium 4.6; Sodium 140  Recent Lipid Panel    Component Value Date/Time   CHOL 198 05/04/2022 1101   TRIG 111 05/04/2022 1101   HDL 55 05/04/2022 1101   CHOLHDL 3.6 05/04/2022 1101   LDLCALC 123 (H) 05/04/2022 1101    Physical Exam:    VS:  BP (!) 142/80 (BP Location: Left Arm, Patient Position: Sitting, Cuff Size: Normal)   Pulse 63   Ht '5\' 6"'$  (1.676 m)   Wt 173 lb 6.4 oz (78.7 kg)   SpO2 93%   BMI 27.99 kg/m     Wt Readings from Last 3 Encounters:  08/18/22 173 lb 6.4 oz (78.7 kg)  05/30/22 173 lb 1 oz (78.5 kg)  05/23/22 173 lb (78.5 kg)     GEN:  Well nourished, well developed in no acute distress HEENT: Normal NECK: No JVD; No carotid bruits LYMPHATICS: No lymphadenopathy CARDIAC: RRR, no murmurs, rubs, gallops RESPIRATORY:  Clear to auscultation without rales, wheezing or rhonchi  ABDOMEN: Soft, non-tender, non-distended MUSCULOSKELETAL:  No edema; No deformity  SKIN: Warm and dry NEUROLOGIC:  Alert and oriented x 3 PSYCHIATRIC:  Normal affect   ASSESSMENT:    1. CAD in native artery   2. Tachycardia   3. Essential hypertension   4. Hyperlipidemia, unspecified hyperlipidemia type     PLAN:    CAD: Status post NSTEMI and BMS to RCA in 2001.  Echocardiogram 05/23/2022 showed normal biventricular function, no significant valvular disease.  Lexiscan Myoview on 05/23/2022 showed normal perfusion, EF 70%. -Continue aspirin 81 mg daily -Continue Toprol-XL, reduced dose to 50 mg daily due to  bradycardia -Continue rosuvastatin 5 mg daily.  Check lipid panel  Leg pain: ABIs normal on 05/19/2022  Hypertension: On lisinopril '20mg'$  daily, Toprol-XL 50 mg daily  Hyperlipidemia: Was on rosuvastatin 5 mg daily.  LDL 49 on 06/11/2021.  Stopped taking rosuvastatin and started on Zetia 10 mg daily, LDL 123 on 05/04/2022.  Restarted rosuvastatin 5 mg daily.  LDL 63 on 07/04/22  Tachycardia: Description concerning for arrhythmia, evaluate with Zio patch x 2  weeks  RTC in 6 months    Medication Adjustments/Labs and Tests Ordered: Current medicines are reviewed at length with the patient today.  Concerns regarding medicines are outlined above.  Orders Placed This Encounter  Procedures   LONG TERM MONITOR (3-14 DAYS)   No orders of the defined types were placed in this encounter.   Patient Instructions  Medication Instructions:  Your physician recommends that you continue on your current medications as directed. Please refer to the Current Medication list given to you today.  *If you need a refill on your cardiac medications before your next appointment, please call your pharmacy*  Testing/Procedures: [place] Monitor Instructions   Your physician has requested you wear a ZIO patch monitor for _14__ days.  This is a single patch monitor.   [employer] supplies one patch monitor per enrollment. Additional stickers are not available. Please do not apply patch if you will be having a Nuclear Stress Test, Echocardiogram, Cardiac CT, MRI, or Chest Xray during the period you would be wearing the monitor. The patch cannot be worn during these tests. You cannot remove and re-apply the ZIO XT patch monitor.  Your ZIO patch monitor will be sent Fed Ex from [employer] directly to your home address. It may take 3-5 days to receive your monitor after you have been enrolled.  Once you have received your monitor, please review the enclosed instructions. Your monitor has already been  registered assigning a specific monitor serial # to you.  Billing and Patient Assistance Program Information   We have supplied [employer] with any of your insurance information on file for billing purposes. [employer] offers a sliding scale Patient Assistance Program for patients that do not have insurance, or whose insurance does not completely cover the cost of the ZIO monitor.   You must apply for the Patient Assistance Program to qualify for this discounted rate.     To apply, please call [employer] at [phone], select option 4, then select option 2, and ask to apply for Patient Assistance Program.  Theodore Demark will ask your household income, and how many people are in your household.  They will quote your out-of-pocket cost based on that information.  [employer] will also be able to set up a 46-month interest-free payment plan if needed.  Applying the monitor   Shave hair from upper left chest.  Hold abrader disc by orange tab. Rub abrader in 40 strokes over the upper left chest as indicated in your monitor instructions.  Clean area with 4 enclosed alcohol pads. Let dry.  Apply patch as indicated in monitor instructions. Patch will be placed under collarbone on left side of chest with arrow pointing upward.  Rub patch adhesive wings for 2 minutes. Remove white label marked "1". Remove the white label marked "2". Rub patch adhesive wings for 2 additional minutes.  While looking in a mirror, press and release button in center of patch. A small green light will flash 3-4 times. This will be your only indicator that the monitor has been turned on. ?  Do not shower for the first 24 hours. You may shower after the first 24 hours.  Press the button if you feel a symptom. You will hear a small click. Record Date, Time and Symptom in the Patient Logbook.  When you are ready to remove the patch, follow instructions on the last 2 pages of the Patient Logbook. Stick patch monitor onto the last page of Patient  Logbook.  Place Patient  Logbook in the blue and white box.  Use locking tab on box and tape box closed securely.  The blue and white box has prepaid postage on it. Please place it in the mailbox as soon as possible. Your physician should have your test results approximately 7 days after the monitor has been mailed back to [facility].  Call [place] at [phone] if you have questions regarding your ZIO XT patch monitor. Call them immediately if you see an orange light blinking on your monitor.  If your monitor falls off in less than 4 days, contact our Monitor department at [phone]. ?If your monitor becomes loose or falls off after 4 days call [employer] at [phone] for suggestions on securing your monitor.?  Follow-Up: At [facility], you and your health needs are our priority.  As part of our continuing mission to provide you with exceptional heart care, we have created designated Provider Care Teams.  These Care Teams include your primary Cardiologist (physician) and Advanced Practice Providers (APPs -  Physician Assistants and Nurse Practitioners) who all work together to provide you with the care you need, when you need it.  We recommend signing up for the patient portal called "MyChart".  Sign up information is provided on this After Visit Summary.  MyChart is used to connect with patients for Virtual Visits (Telemedicine).  Patients are able to view lab/test results, encounter notes, upcoming appointments, etc.  Non-urgent messages can be sent to your provider as well.   To learn more about what you can do with MyChart, go to [email or web address].    Your next appointment:   6 month(s)  Provider:   Dr. Gardiner Rhyme    Signed, Donato Heinz, MD  08/18/2022 5:40 PM    [place]

## 2022-08-18 ENCOUNTER — Ambulatory Visit: Payer: Medicare Other | Admitting: Physical Therapy

## 2022-08-18 ENCOUNTER — Encounter: Payer: Self-pay | Admitting: Occupational Therapy

## 2022-08-18 ENCOUNTER — Ambulatory Visit: Payer: Medicare Other | Admitting: Occupational Therapy

## 2022-08-18 ENCOUNTER — Encounter: Payer: Self-pay | Admitting: Physical Therapy

## 2022-08-18 ENCOUNTER — Ambulatory Visit: Payer: Medicare Other | Attending: Cardiology | Admitting: Cardiology

## 2022-08-18 ENCOUNTER — Encounter: Payer: Self-pay | Admitting: Cardiology

## 2022-08-18 ENCOUNTER — Ambulatory Visit (INDEPENDENT_AMBULATORY_CARE_PROVIDER_SITE_OTHER): Payer: Medicare Other

## 2022-08-18 VITALS — BP 142/80 | HR 63 | Ht 66.0 in | Wt 173.4 lb

## 2022-08-18 DIAGNOSIS — R29818 Other symptoms and signs involving the nervous system: Secondary | ICD-10-CM

## 2022-08-18 DIAGNOSIS — R2681 Unsteadiness on feet: Secondary | ICD-10-CM

## 2022-08-18 DIAGNOSIS — E785 Hyperlipidemia, unspecified: Secondary | ICD-10-CM | POA: Diagnosis not present

## 2022-08-18 DIAGNOSIS — R278 Other lack of coordination: Secondary | ICD-10-CM | POA: Diagnosis not present

## 2022-08-18 DIAGNOSIS — I10 Essential (primary) hypertension: Secondary | ICD-10-CM | POA: Insufficient documentation

## 2022-08-18 DIAGNOSIS — I2510 Atherosclerotic heart disease of native coronary artery without angina pectoris: Secondary | ICD-10-CM

## 2022-08-18 DIAGNOSIS — R000 Tachycardia, unspecified: Secondary | ICD-10-CM

## 2022-08-18 DIAGNOSIS — M6281 Muscle weakness (generalized): Secondary | ICD-10-CM

## 2022-08-18 DIAGNOSIS — R2689 Other abnormalities of gait and mobility: Secondary | ICD-10-CM

## 2022-08-18 DIAGNOSIS — R293 Abnormal posture: Secondary | ICD-10-CM

## 2022-08-18 NOTE — Therapy (Signed)
OUTPATIENT PHYSICAL THERAPY NEURO TREATMENT   Patient Name: Kim Sutton MRN: [id number] DOB:Dec 10, 1933, 89 y.o., female Today's Date: 08/18/2022   PCP: Lajean Manes, MD (retired) REFERRING PROVIDER: Kem Boroughs, MD  END OF SESSION:  PT End of Session - 08/18/22 0936     Visit Number 2    Number of Visits 17   with eval   Date for PT Re-Evaluation 11/01/22   to allow for scheduling delays   Authorization Type Medicare    Progress Note Due on Visit 10    PT Start Time 0932    PT Stop Time 1027    PT Time Calculation (min) 43 min    Activity Tolerance Patient tolerated treatment well    Behavior During Therapy [facility] for tasks assessed/performed             Past Medical History:  Diagnosis Date   Adrenal cortical adenoma of left adrenal gland    stable since 2009;  MRI in 04/2010 - no further scans   CAD (coronary artery disease)    a. s/p NSTEMI in 2001 tx with BMS to RCA   Colon polyps    HLD (hyperlipidemia)    Hx of cardiovascular stress test    Lexiscan Myoview (09/2013):  No ischemia, EF 77%, Low Risk.   Hyperglycemia    Hypertension    Myoclonus    on neurontin per neuro at [facility]   Nephrolithiasis    Nodule of right lung    a. 70m on CXR 10/2009;  CT 05/2010 ok - no further scans   Tobacco abuse    Past Surgical History:  Procedure Laterality Date   CHOLECYSTECTOMY     CORONARY ANGIOPLASTY WITH STENT PLACEMENT     TOTAL ABDOMINAL HYSTERECTOMY     Patient Active Problem List   Diagnosis Date Noted   CAD (coronary artery disease) 09/04/2013   HTN (hypertension) 09/04/2013   HLD (hyperlipidemia) 09/04/2013   Tobacco abuse 09/04/2013    ONSET DATE: 08/05/2022  REFERRING DIAG: G37.3 (ICD-10-CM) - Acute transverse myelitis in demyelinating disease of central nervous system  THERAPY DIAG:  Muscle weakness (generalized)  Other abnormalities of gait and mobility  Unsteadiness on feet  Abnormal posture  Other lack of coordination  Other  symptoms and signs involving the nervous system  Rationale for Evaluation and Treatment: Rehabilitation  SUBJECTIVE:                                                                                                                                                                                             SUBJECTIVE STATEMENT: Pt reports no changes since  evaluation, continues to have difficulty using the right side.  Denies falls/near falls since the last visit.  Pt accompanied by: self and significant other husband [name]  PERTINENT HISTORY: CAD, hyperlipidemia, hypertension, transverse myelitis  PAIN:  Are you having pain? No  PRECAUTIONS: Fall -Pt hears best out of LEFT ear (hard of hearing)  WEIGHT BEARING RESTRICTIONS: No  FALLS: Has patient fallen in last 6 months? Yes. Number of falls 2, minor with no injuries; difficulty getting back up (had to have sons lift her with a blanket one time and called EMS the other time)  LIVING ENVIRONMENT: Lives with: lives with their spouse Lives in: House/apartment Stairs: Yes: Internal: 12 steps; on right going up and External: 6 steps; on right going up; can stay on main level of home Has following equipment at home: Single point cane, Walker - 2 wheeled, and [employer] - 4 wheeled  PLOF: Independent with gait, Independent with transfers, and Requires assistive device for independence  PATIENT GOALS: "be able to get up and walk around and do everything I used to do"  OBJECTIVE:    TODAY'S TREATMENT:                                                                                                                              -seated hamstring stretch 2x45 sec each LE -Seated single knee to chest 2x20 seconds each LE (pt does not tolerate supine well per report) -Time spent positioning in long sitting w/ pt having too much pain in right hip for adequate stretching > regressed to seated 90-90 stretch w/ pt having difficulty gripping w/ RUE >  standing gastroc stretch 2x45 sec alt LE -Standing hip extension for hip flexor stretch and glut strength, pt requires seated rest following. -SciFit w/ BUE/BLE for reciprocal mobility, large amplitude movement, and general endurance x5 minutes at level 2.0.  Total steps achieved 297 total steps, RPE 5-6/10, pt requires short seated quiet rest following task.  PATIENT EDUCATION: Education details: Initial HEP instructions.  Discussed ongoing contracture in right ankle.  Modifications to stretching for comfort and ease of performance. Person educated: Patient and Spouse Education method: Customer service manager Education comprehension: verbalized understanding, returned demonstration, and needs further education  HOME EXERCISE PROGRAM: Access Code: [email or web address] URL: [email or web address] Date: 08/18/2022 Prepared by: Elease Etienne  Exercises - Seated Hamstring Stretch  - 1 x daily - 4-5 x weekly - 1 sets - 2 reps - 45 seconds hold - Seated Single Knee to Chest  - 1 x daily - 4-5 x weekly - 1 sets - 2 reps - 20-30 seconds hold - Gastroc Stretch on Wall  - 1 x daily - 5 x weekly - 1 sets - 1-2 reps - 45 seconds hold - Standing Hip Extension with Counter Support  - 1 x daily - 5 x weekly - 2 sets - 10 reps  GOALS: Goals reviewed with patient? Yes  SHORT TERM GOALS: Target date: 09/06/2022  Pt will be independent with initial HEP for improved strength, balance, transfers and gait. Baseline: Goal status: INITIAL  2.  Pt will improve 5 x STS to less than or equal to 20 seconds to demonstrate improved functional strength and transfer efficiency.  Baseline: 25.43 sec (1/9) Goal status: INITIAL  3.  Pt will improve gait velocity to at least 2.5 ft/sec for improved gait efficiency and performance at Supervision level  Baseline: 2.2 ft/sec with SPC and CGA (1/9) Goal status: INITIAL  4.  Pt will improve her cervical ROM of R rotation and R lateral flexion >/=10 degrees  to demonstrate improved function Baseline: 40 degrees R rotation, 5 degrees R lateral flexion (1/9) Goal status: INITIAL  5.  Pt will be able to ambulate x 150 ft with LRAD at Supervision level Baseline: 35 ft with SPC and CGA (1/9) Goal status: INITIAL   LONG TERM GOALS: Target date: 10/04/2022   Pt will be independent with final HEP for improved strength, balance, transfers and gait. Baseline:  Goal status: INITIAL  2.  Pt will improve 5 x STS to less than or equal to 15 seconds to demonstrate improved functional strength and transfer efficiency.  Baseline: 25.43 sec (1/9) Goal status: INITIAL  3.  Pt will improve gait velocity to at least 2.75 ft/sec for improved gait efficiency and performance at mod I level  Baseline: 2.2 ft/sec with SPC and CGA (1/9) Goal status: INITIAL  4.  Pt will be able to ambulate x 500 ft with LRAD at mod I level Baseline: 35 ft with [facility] and CGA (1/9) Goal status: INITIAL  5.  Pt will improve normal TUG to less than or equal to 15 seconds for improved functional mobility and decreased fall risk. Baseline: 17.4 sec with SPC (1/9) Goal status: INITIAL  ASSESSMENT:  CLINICAL IMPRESSION: Initiated HEP this session with emphasis on hip mobility, right moreso than left as most tightness and discomfort persists there.  She continues to be challenged by ongoing contractures mostly of the right ankle into PF and decreased cardiovascular endurance and activity tolerance.  Will continue to address these deficits along w/ gait deviations and safety with functional tasks.  OBJECTIVE IMPAIRMENTS: Abnormal gait, decreased balance, decreased endurance, decreased mobility, difficulty walking, decreased ROM, decreased strength, impaired perceived functional ability, impaired flexibility, impaired UE functional use, improper body mechanics, postural dysfunction, and pain.   ACTIVITY LIMITATIONS: carrying, lifting, bending, squatting, stairs, transfers, and bed  mobility  PARTICIPATION LIMITATIONS: driving and community activity  PERSONAL FACTORS: Age, Time since onset of injury/illness/exacerbation, and 1-2 comorbidities:    CAD, hyperlipidemia, hypertensionare also affecting patient's functional outcome.   REHAB POTENTIAL: Good  CLINICAL DECISION MAKING: Stable/uncomplicated  EVALUATION COMPLEXITY: Low  PLAN:  PT FREQUENCY: 2x/week  PT DURATION: 8 weeks  PLANNED INTERVENTIONS: Therapeutic exercises, Therapeutic activity, Neuromuscular re-education, Balance training, Gait training, Patient/Family education, Self Care, Joint mobilization, Stair training, Vestibular training, Canalith repositioning, Visual/preceptual remediation/compensation, Orthotic/Fit training, DME instructions, Aquatic Therapy, Dry Needling, Electrical stimulation, Spinal mobilization, Cryotherapy, Moist heat, Taping, Manual therapy, and Re-evaluation  PLAN FOR NEXT SESSION: Modify HEP prn for RLE ROM, strengthening (hip ROM, HS and gastroc stretches, sit to stands), endurance, SciFit vs NuStep, cervical ROM/stretching   Bary Richard, PT, DPT 08/18/2022, 10:18 AM

## 2022-08-18 NOTE — Patient Instructions (Signed)
Medication Instructions:  Your physician recommends that you continue on your current medications as directed. Please refer to the Current Medication list given to you today.  *If you need a refill on your cardiac medications before your next appointment, please call your pharmacy*  Testing/Procedures: [place] Monitor Instructions   Your physician has requested you wear a ZIO patch monitor for _14__ days.  This is a single patch monitor.   [employer] supplies one patch monitor per enrollment. Additional stickers are not available. Please do not apply patch if you will be having a Nuclear Stress Test, Echocardiogram, Cardiac CT, MRI, or Chest Xray during the period you would be wearing the monitor. The patch cannot be worn during these tests. You cannot remove and re-apply the ZIO XT patch monitor.  Your ZIO patch monitor will be sent Fed Ex from [employer] directly to your home address. It may take 3-5 days to receive your monitor after you have been enrolled.  Once you have received your monitor, please review the enclosed instructions. Your monitor has already been registered assigning a specific monitor serial # to you.  Billing and Patient Assistance Program Information   We have supplied [employer] with any of your insurance information on file for billing purposes. [employer] offers a sliding scale Patient Assistance Program for patients that do not have insurance, or whose insurance does not completely cover the cost of the ZIO monitor.   You must apply for the Patient Assistance Program to qualify for this discounted rate.     To apply, please call [employer] at [phone], select option 4, then select option 2, and ask to apply for Patient Assistance Program.  Theodore Demark will ask your household income, and how many people are in your household.  They will quote your out-of-pocket cost based on that information.  [employer] will also be able to set up a 50-month interest-free payment  plan if needed.  Applying the monitor   Shave hair from upper left chest.  Hold abrader disc by orange tab. Rub abrader in 40 strokes over the upper left chest as indicated in your monitor instructions.  Clean area with 4 enclosed alcohol pads. Let dry.  Apply patch as indicated in monitor instructions. Patch will be placed under collarbone on left side of chest with arrow pointing upward.  Rub patch adhesive wings for 2 minutes. Remove white label marked "1". Remove the white label marked "2". Rub patch adhesive wings for 2 additional minutes.  While looking in a mirror, press and release button in center of patch. A small green light will flash 3-4 times. This will be your only indicator that the monitor has been turned on. ?  Do not shower for the first 24 hours. You may shower after the first 24 hours.  Press the button if you feel a symptom. You will hear a small click. Record Date, Time and Symptom in the Patient Logbook.  When you are ready to remove the patch, follow instructions on the last 2 pages of the Patient Logbook. Stick patch monitor onto the last page of Patient Logbook.  Place Patient Logbook in the blue and white box.  Use locking tab on box and tape box closed securely.  The blue and white box has prepaid postage on it. Please place it in the mailbox as soon as possible. Your physician should have your test results approximately 7 days after the monitor has [facility]  Call [facility]  at [phone] if you have questions regarding your ZIO XT patch monitor. Call them immediately if you see an orange light blinking on your monitor.  If your monitor falls off in less than 4 days, contact our Monitor department at [phone]. ?If your monitor becomes loose or falls off after 4 days call [employer] at [phone] for suggestions on securing your monitor.?  Follow-Up: At [facility], you and your health needs are our priority.   As part of our continuing mission to provide you with exceptional heart care, we have created designated Provider Care Teams.  These Care Teams include your primary Cardiologist (physician) and Advanced Practice Providers (APPs -  Physician Assistants and Nurse Practitioners) who all work together to provide you with the care you need, when you need it.  We recommend signing up for the patient portal called "MyChart".  Sign up information is provided on this After Visit Summary.  MyChart is used to connect with patients for Virtual Visits (Telemedicine).  Patients are able to view lab/test results, encounter notes, upcoming appointments, etc.  Non-urgent messages can be sent to your provider as well.   To learn more about what you can do with MyChart, go to [email or web address].    Your next appointment:   6 month(s)  Provider:   Dr. Gardiner Rhyme

## 2022-08-18 NOTE — Patient Instructions (Addendum)
  Coordination Activities  Perform the following activities for 15-20 minutes 1-2 times per day with right hand(s).  Rotate ball in fingertips Flip cards 1 at a time as fast as you can. Deal cards with your thumb (Hold deck in hand and push card off top with thumb). Pick up coins, buttons, marbles, dried beans/pasta of different sizes and place in container. Pick up coins and place in container or coin bank.  Try to pick up with index finger and thumb Pick up coins and stack. Screw together nuts and bolts, then unfasten or twist bottle caps on/off. Squeeze yellow putty with whole hand 10-15 times  Roll out putty and pinch with each finger and thumb 2-3x.

## 2022-08-18 NOTE — Progress Notes (Unsigned)
Enrolled patient for a 14 day Zio XT  monitor to be mailed to patients home  °

## 2022-08-18 NOTE — Therapy (Addendum)
OUTPATIENT OCCUPATIONAL THERAPY NEURO TREATMENT  Patient Name: Kim Sutton MRN: [id number] DOB:[birth year], 89 y.o., female Today's Date: 08/18/2022  PCP: Dr. Louis Matte REFERRING PROVIDER: Kem Boroughs, MD  END OF SESSION:  OT End of Session - 08/18/22 0857     Visit Number 2    Number of Visits 17    Date for OT Re-Evaluation 10/08/22    Authorization Type MCR, A[employer] - covered 100%    Authorization - Number of Visits 2    Progress Note Due on Visit 10    OT Start Time 0848    OT Stop Time 0929    OT Time Calculation (min) 41 min    Activity Tolerance Patient tolerated treatment well    Behavior During Therapy [facility] for tasks assessed/performed              Past Medical History:  Diagnosis Date   Adrenal cortical adenoma of left adrenal gland    stable since 2009;  MRI in 04/2010 - no further scans   CAD (coronary artery disease)    a. s/p NSTEMI in 2001 tx with BMS to RCA   Colon polyps    HLD (hyperlipidemia)    Hx of cardiovascular stress test    Lexiscan Myoview (09/2013):  No ischemia, EF 77%, Low Risk.   Hyperglycemia    Hypertension    Myoclonus    on neurontin per neuro at [facility]   Nephrolithiasis    Nodule of right lung    a. 81m on CXR 10/2009;  CT 05/2010 ok - no further scans   Tobacco abuse    Past Surgical History:  Procedure Laterality Date   CHOLECYSTECTOMY     CORONARY ANGIOPLASTY WITH STENT PLACEMENT     TOTAL ABDOMINAL HYSTERECTOMY     Patient Active Problem List   Diagnosis Date Noted   CAD (coronary artery disease) 09/04/2013   HTN (hypertension) 09/04/2013   HLD (hyperlipidemia) 09/04/2013   Tobacco abuse 09/04/2013    ONSET DATE: 08/05/2022  REFERRING DIAG: G37.3 (ICD-10-CM) - Acute transverse myelitis in demyelinating disease of central nervous system  (History of cervical transverse myelitis resulting in right sided weakness; more weakness on right side now which is likely due to debility and aging and right shoulder and wrist pain  after falls (has been evaluated in ER)  Needs strengthening, ROM, endurance, appropriate use of cane)  THERAPY DIAG:  Muscle weakness (generalized)  Other lack of coordination  Other symptoms and signs involving the nervous system  Abnormal posture  Unsteadiness on feet  Rationale for Evaluation and Treatment: Rehabilitation  SUBJECTIVE:   SUBJECTIVE STATEMENT: I get frustrated when I can't do things.  My Rt side is weak Pt accompanied by: significant other (husband)   PERTINENT HISTORY: [name] an 89year old right-handed female with a history of hypertension, hyperlipidemia, coronary artery disease status post stent and cervical transverse myelitis presenting to clinic with chief concern of history of transverse myelitis and multiple falls.  PRECAUTIONS: Fall and Other: stent   HARD OF HEARING  WEIGHT BEARING RESTRICTIONS: No  PAIN:  Are you having pain? no  FALLS: Has patient fallen in last 6 months? Yes. Number of falls 2  LIVING ENVIRONMENT: Lives with: lives with their spouse Pt lives in 2 story house, but lives on first floor with 6 steps to enter from garage, railing on Rt going up Has following equipment at home: Single point cane, WEnvironmental consultant- 2 wheeled, Grab bars, and rollator, built in shower  seat, grab bars in shower and toilet area  PLOF:  occasional min assist for BADLS in the last year  PATIENT GOALS: increase RUE/hand use  OBJECTIVE:   HAND DOMINANCE: Right   UPPER EXTREMITY ROM:    LUE AROM WNL's  RUE: shoulder flex approx 50*, abduction 45*, ER approx 50%, IR 75%; elbow, forearm and wrist WFL's. Rt hand movements slightly weaker but can do all intrinsic movement with difficulty  HAND FUNCTION: Grip strength: Right: 15.2 lbs; Left: 39.9 lbs  COORDINATION: 9 Hole Peg test: Right: 43.45 sec; Left: 47.67 sec    TODAY'S TREATMENT:                                                                                                                               DATE: 08/18/21  Pt instructed in initial HEP for coordination and yellow putty with right hand and returned demo.  Min cueing provided for shoulder/trunk/head position.  Low range AAROM cane exercises in sitting for elbow ext and shoulder flex with mod cueing for positioning.  Followed by lateral head flex to midline with min cueing.    PATIENT EDUCATION: Education details: Yellow putty HEP and coordination HEP Person educated: Patient and Spouse Education method: Explanation, Demonstration, Tactile cues, Verbal cues, and Handouts Education comprehension: verbalized understanding, returned demonstration, and verbal cues required  HOME EXERCISE PROGRAM: 08/18/22: Yellow putty HEP and coordination HEP   GOALS: Goals reviewed with patient? Yes  SHORT TERM GOALS: Target date: 09/09/22  Independent with HEP for Rt shoulder ROM, Rt grip strength, and bilateral coordination Goal status: INITIAL  2.  Pt to verbalize understanding with A/E to increase ease with ADLS (LH brush, button hook, etc)  Goal status: INITIAL  3.  Pt to eat 50% with Rt dominant hand and brush teeth 25% with Rt hand Goal status: INITIAL  4.  Pt to consistently doff pull over shirt I'ly Goal status: INITIAL   LONG TERM GOALS: Target date: 10/08/22  Independent with updated HEP PRN Goal status: INITIAL  2.  Pt to eat 75% with Rt hand, and perform grooming 50% with Rt hand using A/E prn for brushing hair Baseline:  Goal status: INITIAL  3.  Pt to improve bilateral coordination by 5 sec on 9 hole peg test  Baseline: Rt = 43.45 sec, Lt = 47.67 sec Goal status: INITIAL  4.  Pt to improve Rt hand grip strength by 5 lbs  Baseline: 15.2 lbs Goal status: INITIAL  5.  Pt to improve RUE sh flexion to 70* for greater ease with dressing, grooming and low to mid level reaching Baseline:  Goal status: INITIAL   ASSESSMENT:  CLINICAL IMPRESSION: Pt verbalized understanding of initial HEP.  Pt would  benefit from additional cueing for head/trunk alignment as pt tends to lean to the left.   PERFORMANCE DEFICITS: in functional skills including ADLs, IADLs, coordination, dexterity, edema, ROM, strength, pain, flexibility, Fine motor control, Gross motor control,  mobility, body mechanics, endurance, decreased knowledge of use of DME, and UE functional use.   IMPAIRMENTS: are limiting patient from ADLs, IADLs, and leisure.   CO-MORBIDITIES: may have co-morbidities  that affects occupational performance. Patient will benefit from skilled OT to address above impairments and improve overall function.  MODIFICATION OR ASSISTANCE TO COMPLETE EVALUATION: No modification of tasks or assist necessary to complete an evaluation.  OT OCCUPATIONAL PROFILE AND HISTORY: Problem focused assessment: Including review of records relating to presenting problem.  CLINICAL DECISION MAKING: Moderate - several treatment options, min-mod task modification necessary  REHAB POTENTIAL: Fair time since onset and gradual decline over last few years  EVALUATION COMPLEXITY: Low   PLAN:  OT FREQUENCY: 2x/week  OT DURATION: 8 weeks, plus evaluation  PLANNED INTERVENTIONS: self care/ADL training, therapeutic exercise, therapeutic activity, neuromuscular re-education, manual therapy, passive range of motion, functional mobility training, splinting, paraffin, fluidotherapy, moist heat, patient/family education, coping strategies training, and DME and/or AE instructions  RECOMMENDED OTHER SERVICES: none at this time  CONSULTED AND AGREED WITH PLAN OF CARE: Patient and family member/caregiver  PLAN FOR NEXT SESSION: HEP for shoulder AAROM (?in supine)   Tamey Wanek, OTR/L 08/18/2022, 10:17 AM

## 2022-08-18 NOTE — Patient Instructions (Signed)
Access Code: [email or web address] URL: [email or web address] Date: 08/18/2022 Prepared by: Elease Etienne  Exercises - Seated Hamstring Stretch  - 1 x daily - 4-5 x weekly - 1 sets - 2 reps - 45 seconds hold - Seated Single Knee to Chest  - 1 x daily - 4-5 x weekly - 1 sets - 2 reps - 20-30 seconds hold - Gastroc Stretch on Wall  - 1 x daily - 5 x weekly - 1 sets - 1-2 reps - 45 seconds hold - Standing Hip Extension with Counter Support  - 1 x daily - 5 x weekly - 2 sets - 10 reps

## 2022-08-21 NOTE — Therapy (Signed)
OUTPATIENT OCCUPATIONAL THERAPY NEURO TREATMENT  Patient Name: Kim Sutton MRN: [id number] DOB:Jun 12, 1934, 89 y.o., female Today's Date: 08/22/2022  PCP: Dr. Louis Sutton REFERRING PROVIDER: Kem Boroughs, MD  END OF SESSION:  OT End of Session - 08/22/22 0854     Visit Number 3    Number of Visits 17    Date for OT Re-Evaluation 10/08/22    Authorization Type MCR, [employer] - covered 100%    Authorization - Number of Visits 3    Progress Note Due on Visit 10    OT Start Time 0848    OT Stop Time 0930    OT Time Calculation (min) 42 min    Activity Tolerance Patient tolerated treatment well    Behavior During Therapy [facility] for tasks assessed/performed               Past Medical History:  Diagnosis Date   Adrenal cortical adenoma of left adrenal gland    stable since 2009;  MRI in 04/2010 - no further scans   CAD (coronary artery disease)    a. s/p NSTEMI in 2001 tx with BMS to RCA   Colon polyps    HLD (hyperlipidemia)    Hx of cardiovascular stress test    Lexiscan Myoview (09/2013):  No ischemia, EF 77%, Low Risk.   Hyperglycemia    Hypertension    Myoclonus    on neurontin per neuro at [facility]   Nephrolithiasis    Nodule of right lung    a. 23m on CXR 10/2009;  CT 05/2010 ok - no further scans   Tobacco abuse    Past Surgical History:  Procedure Laterality Date   CHOLECYSTECTOMY     CORONARY ANGIOPLASTY WITH STENT PLACEMENT     TOTAL ABDOMINAL HYSTERECTOMY     Patient Active Problem List   Diagnosis Date Noted   CAD (coronary artery disease) 09/04/2013   HTN (hypertension) 09/04/2013   HLD (hyperlipidemia) 09/04/2013   Tobacco abuse 09/04/2013    ONSET DATE: 08/05/2022  REFERRING DIAG: G37.3 (ICD-10-CM) - Acute transverse myelitis in demyelinating disease of central nervous system  (History of cervical transverse myelitis resulting in right sided weakness; more weakness on right side now which is likely due to debility and aging and right shoulder and wrist  pain after falls (has been evaluated in ER)  Needs strengthening, ROM, endurance, appropriate use of cane)  THERAPY DIAG:  Muscle weakness (generalized)  Other lack of coordination  Other symptoms and signs involving the nervous system  Abnormal posture  Unsteadiness on feet  Other abnormalities of gait and mobility  Rationale for Evaluation and Treatment: Rehabilitation  SUBJECTIVE:   SUBJECTIVE STATEMENT: No pain   My Rt side is weak Pt accompanied by: significant other (husband)   PERTINENT HISTORY: Ms. Kim Sutton an 89year old right-handed female with a history of hypertension, hyperlipidemia, coronary artery disease status post stent and cervical transverse myelitis presenting to clinic with chief concern of history of transverse myelitis and multiple falls.  PRECAUTIONS: Fall and Other: stent   HARD OF HEARING  WEIGHT BEARING RESTRICTIONS: No  PAIN:  Are you having pain? no  FALLS: Has patient fallen in last 6 months? Yes. Number of falls 2  LIVING ENVIRONMENT: Lives with: lives with their spouse Pt lives in 2 story house, but lives on first floor with 6 steps to enter from garage, railing on Rt going up Has following equipment at home: Single point cane, WEnvironmental consultant- 2 wheeled, Grab bars, and rollator,  built in shower seat, grab bars in shower and toilet area  PLOF:  occasional min assist for BADLS in the last year  PATIENT GOALS: increase RUE/hand use  OBJECTIVE:   HAND DOMINANCE: Right   UPPER EXTREMITY ROM:    LUE AROM WNL's  RUE: shoulder flex approx 50*, abduction 45*, ER approx 50%, IR 75%; elbow, forearm and wrist WFL's. Rt hand movements slightly weaker but can do all intrinsic movement with difficulty  HAND FUNCTION: Grip strength: Right: 15.2 lbs; Left: 39.9 lbs  COORDINATION: 9 Hole Peg test: Right: 43.45 sec; Left: 47.67 sec    TODAY'S TREATMENT:                                                                                                                                Pt instructed in initial HEP for shoulder.  Min cueing provided for shoulder/trunk/head position.  In supine, scapular retraction and depression x 2 sets of 10 with min cueing/facilitation.  Placing/removing medium pegs in pegboard with R hand with mirror and min cueing for head/trunk/shoulder positioning.  PATIENT EDUCATION: Education details: Futures trader for shoulder Person educated: Patient and Spouse Education method: Explanation, Demonstration, Corporate treasurer cues, Verbal cues, and Handouts Education comprehension: verbalized understanding, returned demonstration, and verbal cues required  HOME EXERCISE PROGRAM: 08/18/22: Yellow putty HEP and coordination HEP 08/22/22:  Cane HEP   GOALS: Goals reviewed with patient? Yes  SHORT TERM GOALS: Target date: 09/09/22  Independent with HEP for Rt shoulder ROM, Rt grip strength, and bilateral coordination Goal status: INITIAL  2.  Pt to verbalize understanding with A/E to increase ease with ADLS (LH brush, button hook, etc)  Goal status: INITIAL  3.  Pt to eat 50% with Rt dominant hand and brush teeth 25% with Rt hand Goal status: INITIAL  4.  Pt to consistently doff pull over shirt I'ly Goal status: INITIAL   LONG TERM GOALS: Target date: 10/08/22  Independent with updated HEP PRN Goal status: INITIAL  2.  Pt to eat 75% with Rt hand, and perform grooming 50% with Rt hand using A/E prn for brushing hair Baseline:  Goal status: INITIAL  3.  Pt to improve bilateral coordination by 5 sec on 9 hole peg test  Baseline: Rt = 43.45 sec, Lt = 47.67 sec Goal status: INITIAL  4.  Pt to improve Rt hand grip strength by 5 lbs  Baseline: 15.2 lbs Goal status: INITIAL  5.  Pt to improve RUE sh flexion to 70* for greater ease with dressing, grooming and low to mid level reaching Baseline:  Goal status: INITIAL   ASSESSMENT:  CLINICAL IMPRESSION:  Pt verbalized understanding of initial shoulder HEP.  Pt will  continue to need cueing for shoulder/trunk/head compensations due to weakness and decr awareness.    PERFORMANCE DEFICITS: in functional skills including ADLs, IADLs, coordination, dexterity, edema, ROM, strength, pain, flexibility, Fine motor control, Gross motor control, mobility, body mechanics, endurance, decreased  knowledge of use of DME, and UE functional use.   IMPAIRMENTS: are limiting patient from ADLs, IADLs, and leisure.   CO-MORBIDITIES: may have co-morbidities  that affects occupational performance. Patient will benefit from skilled OT to address above impairments and improve overall function.  MODIFICATION OR ASSISTANCE TO COMPLETE EVALUATION: No modification of tasks or assist necessary to complete an evaluation.  OT OCCUPATIONAL PROFILE AND HISTORY: Problem focused assessment: Including review of records relating to presenting problem.  CLINICAL DECISION MAKING: Moderate - several treatment options, min-mod task modification necessary  REHAB POTENTIAL: Fair time since onset and gradual decline over last few years  EVALUATION COMPLEXITY: Low   PLAN:  OT FREQUENCY: 2x/week  OT DURATION: 8 weeks, plus evaluation  PLANNED INTERVENTIONS: self care/ADL training, therapeutic exercise, therapeutic activity, neuromuscular re-education, manual therapy, passive range of motion, functional mobility training, splinting, paraffin, fluidotherapy, moist heat, patient/family education, coping strategies training, and DME and/or AE instructions  RECOMMENDED OTHER SERVICES: none at this time  CONSULTED AND AGREED WITH PLAN OF CARE: Patient and family member/caregiver  PLAN FOR NEXT SESSION: continue with AAROM, scapular stability, coordination, hand strength, functional reach   [facility], OTR/L 08/22/2022, 9:49 AM

## 2022-08-22 ENCOUNTER — Ambulatory Visit: Payer: Medicare Other | Admitting: Physical Therapy

## 2022-08-22 ENCOUNTER — Encounter: Payer: Self-pay | Admitting: Occupational Therapy

## 2022-08-22 ENCOUNTER — Ambulatory Visit: Payer: Medicare Other | Admitting: Occupational Therapy

## 2022-08-22 DIAGNOSIS — M6281 Muscle weakness (generalized): Secondary | ICD-10-CM | POA: Diagnosis not present

## 2022-08-22 DIAGNOSIS — R293 Abnormal posture: Secondary | ICD-10-CM

## 2022-08-22 DIAGNOSIS — R278 Other lack of coordination: Secondary | ICD-10-CM

## 2022-08-22 DIAGNOSIS — R2681 Unsteadiness on feet: Secondary | ICD-10-CM

## 2022-08-22 DIAGNOSIS — R29818 Other symptoms and signs involving the nervous system: Secondary | ICD-10-CM

## 2022-08-22 DIAGNOSIS — R2689 Other abnormalities of gait and mobility: Secondary | ICD-10-CM | POA: Diagnosis not present

## 2022-08-22 NOTE — Therapy (Signed)
OUTPATIENT PHYSICAL THERAPY NEURO TREATMENT   Patient Name: GERMANY DODGEN MRN: [id number] DOB:03/17/1934, 89 y.o., female Today's Date: 08/22/2022   PCP: Lajean Manes, MD (retired) REFERRING PROVIDER: Kem Boroughs, MD  END OF SESSION:  PT End of Session - 08/22/22 0933     Visit Number 3    Number of Visits 17   with eval   Date for PT Re-Evaluation 11/01/22   to allow for scheduling delays   Authorization Type Medicare    Progress Note Due on Visit 10    PT Start Time 0933   handoff from OT   PT Stop Time 1012    PT Time Calculation (min) 39 min    Equipment Utilized During Treatment Gait belt    Activity Tolerance Patient tolerated treatment well    Behavior During Therapy [facility] for tasks assessed/performed              Past Medical History:  Diagnosis Date   Adrenal cortical adenoma of left adrenal gland    stable since 2009;  MRI in 04/2010 - no further scans   CAD (coronary artery disease)    a. s/p NSTEMI in 2001 tx with BMS to RCA   Colon polyps    HLD (hyperlipidemia)    Hx of cardiovascular stress test    Lexiscan Myoview (09/2013):  No ischemia, EF 77%, Low Risk.   Hyperglycemia    Hypertension    Myoclonus    on neurontin per neuro at [facility]   Nephrolithiasis    Nodule of right lung    a. 46m on CXR 10/2009;  CT 05/2010 ok - no further scans   Tobacco abuse    Past Surgical History:  Procedure Laterality Date   CHOLECYSTECTOMY     CORONARY ANGIOPLASTY WITH STENT PLACEMENT     TOTAL ABDOMINAL HYSTERECTOMY     Patient Active Problem List   Diagnosis Date Noted   CAD (coronary artery disease) 09/04/2013   HTN (hypertension) 09/04/2013   HLD (hyperlipidemia) 09/04/2013   Tobacco abuse 09/04/2013    ONSET DATE: 08/05/2022  REFERRING DIAG: G37.3 (ICD-10-CM) - Acute transverse myelitis in demyelinating disease of central nervous system  THERAPY DIAG:  Muscle weakness (generalized)  Other lack of coordination  Abnormal posture  Other  symptoms and signs involving the nervous system  Unsteadiness on feet  Other abnormalities of gait and mobility  Rationale for Evaluation and Treatment: Rehabilitation  SUBJECTIVE:  SUBJECTIVE STATEMENT: Pt reports she is doing well today, just a little tired with some R shoulder soreness after OT session. No falls or any other acute changes since last visit.  Pt accompanied by: self and significant other husband [name]  PERTINENT HISTORY: CAD, hyperlipidemia, hypertension, transverse myelitis  PAIN:  Are you having pain? No  PRECAUTIONS: Fall -Pt hears best out of LEFT ear (hard of hearing)  WEIGHT BEARING RESTRICTIONS: No  FALLS: Has patient fallen in last 6 months? Yes. Number of falls 2, minor with no injuries; difficulty getting back up (had to have sons lift her with a blanket one time and called EMS the other time)  LIVING ENVIRONMENT: Lives with: lives with their spouse Lives in: House/apartment Stairs: Yes: Internal: 12 steps; on right going up and External: 6 steps; on right going up; can stay on main level of home Has following equipment at home: Single point cane, Walker - 2 wheeled, and [employer] - 4 wheeled  PLOF: Independent with gait, Independent with transfers, and Requires assistive device for independence  PATIENT GOALS: "be able to get up and walk around and do everything I used to do"  OBJECTIVE:    TODAY'S TREATMENT:                                                                                                                              THER EX: Semi-reclined on mat table on blue wedge for pt comfort: Cervical lateral flexion to the R 3 x 30 sec each Cervical rotation to the R 3 x 30 sec each  Sit to stand 2 x 10 reps no AD pushing up with BUE from mat table for LE  strengthening  Added to HEP, see bolded below  THER ACT: In // bars for LE NMR and increased step height: Sidesteps L/R 2 x 10 ft Sidesteps L/R 3 x 10 ft stepping over 1" foam blocks Increased difficulty clearing RLE as compared to LLE 4" step ups with RLE 2 x 10 reps 4" eccentric step downs with LLE x 10 reps Difficulty control R knee from hyperextending Transition to 2" eccentric step downs with LLE x 10 reps Improved R knee control noted   PATIENT EDUCATION: Education details: continue HEP, added to HEP Person educated: Patient and Spouse Education method: Explanation, Media planner, and Handouts Education comprehension: verbalized understanding, returned demonstration, and needs further education  HOME EXERCISE PROGRAM: Access Code: [email or web address] URL: [email or web address] Date: 08/18/2022 Prepared by: Elease Etienne  Exercises - Seated Hamstring Stretch  - 1 x daily - 4-5 x weekly - 1 sets - 2 reps - 45 seconds hold - Seated Single Knee to Chest  - 1 x daily - 4-5 x weekly - 1 sets - 2 reps - 20-30 seconds hold - Gastroc Stretch on Wall  - 1 x daily - 5 x weekly - 1 sets - 1-2 reps - 45 seconds hold - Standing Hip Extension with Counter Support  -  1 x daily - 5 x weekly - 2 sets - 10 reps - Seated Upper Trapezius Stretch  - 1 x daily - 7 x weekly - 1 sets - 5 reps - 30 sec hold - Seated Cervical Rotation AROM  - 1 x daily - 7 x weekly - 1 sets - 5 reps - 30 sec hold - Sit to Stand with Armchair  - 1 x daily - 7 x weekly - 3 sets - 10 reps  GOALS: Goals reviewed with patient? Yes  SHORT TERM GOALS: Target date: 09/06/2022   Pt will be independent with initial HEP for improved strength, balance, transfers and gait. Baseline: Goal status: INITIAL  2.  Pt will improve 5 x STS to less than or equal to 20 seconds to demonstrate improved functional strength and transfer efficiency.  Baseline: 25.43 sec (1/9) Goal status: INITIAL  3.  Pt will improve gait  velocity to at least 2.5 ft/sec for improved gait efficiency and performance at Supervision level  Baseline: 2.2 ft/sec with SPC and CGA (1/9) Goal status: INITIAL  4.  Pt will improve her cervical ROM of R rotation and R lateral flexion >/=10 degrees to demonstrate improved function Baseline: 40 degrees R rotation, 5 degrees R lateral flexion (1/9) Goal status: INITIAL  5.  Pt will be able to ambulate x 150 ft with LRAD at Supervision level Baseline: 35 ft with SPC and CGA (1/9) Goal status: INITIAL   LONG TERM GOALS: Target date: 10/04/2022   Pt will be independent with final HEP for improved strength, balance, transfers and gait. Baseline:  Goal status: INITIAL  2.  Pt will improve 5 x STS to less than or equal to 15 seconds to demonstrate improved functional strength and transfer efficiency.  Baseline: 25.43 sec (1/9) Goal status: INITIAL  3.  Pt will improve gait velocity to at least 2.75 ft/sec for improved gait efficiency and performance at mod I level  Baseline: 2.2 ft/sec with SPC and CGA (1/9) Goal status: INITIAL  4.  Pt will be able to ambulate x 500 ft with LRAD at mod I level Baseline: 35 ft with [facility] and CGA (1/9) Goal status: INITIAL  5.  Pt will improve normal TUG to less than or equal to 15 seconds for improved functional mobility and decreased fall risk. Baseline: 17.4 sec with SPC (1/9) Goal status: INITIAL  ASSESSMENT:  CLINICAL IMPRESSION: Emphasis of skilled PT session on adding in cervical ROM/stretching to HEP, adding in LE strengthening to HEP, and working on RLE strengthening and NMR in // bars. Pt continues to exhibit RLE weakness as compared to LLE with difficulty performing hip and knee flexion due to muscle weakness. Pt continues to benefit from skilled therapy services to address ongoing weakness leading to increased fall risk. Continue POC.   OBJECTIVE IMPAIRMENTS: Abnormal gait, decreased balance, decreased endurance, decreased mobility,  difficulty walking, decreased ROM, decreased strength, impaired perceived functional ability, impaired flexibility, impaired UE functional use, improper body mechanics, postural dysfunction, and pain.   ACTIVITY LIMITATIONS: carrying, lifting, bending, squatting, stairs, transfers, and bed mobility  PARTICIPATION LIMITATIONS: driving and community activity  PERSONAL FACTORS: Age, Time since onset of injury/illness/exacerbation, and 1-2 comorbidities:    CAD, hyperlipidemia, hypertensionare also affecting patient's functional outcome.   REHAB POTENTIAL: Good  CLINICAL DECISION MAKING: Stable/uncomplicated  EVALUATION COMPLEXITY: Low  PLAN:  PT FREQUENCY: 2x/week  PT DURATION: 8 weeks  PLANNED INTERVENTIONS: Therapeutic exercises, Therapeutic activity, Neuromuscular re-education, Balance training, Gait training, Patient/Family education, Self  Care, Joint mobilization, Stair training, Vestibular training, Canalith repositioning, Visual/preceptual remediation/compensation, Orthotic/Fit training, DME instructions, Aquatic Therapy, Dry Needling, Electrical stimulation, Spinal mobilization, Cryotherapy, Moist heat, Taping, Manual therapy, and Re-evaluation  PLAN FOR NEXT SESSION: how is HEP? modify HEP prn for RLE ROM, strengthening (hip ROM, HS and gastroc stretches, sit to stands), endurance, SciFit vs NuStep, standing marches, resisted sidesteps, RLE hip and knee flexor strengthening, eccentric step downs, Blaze pods on various height surfaces   Excell Seltzer, PT, DPT, CSRS 08/22/2022, 10:12 AM

## 2022-08-22 NOTE — Patient Instructions (Addendum)
    Lie on back holding wand. Raise arms over head. Hold 3sec. Repeat 5 times, rest and do 5 more for a total of 10.      Press-Up With Wand   Press wand up until elbows are straight Hold 3sec. Repeat 5 times, rest and do 5 more for a total of 10.     ROM: Abduction - Wand   LYING DOWN.  Holding wand with RIGHT hand palm up, push wand directly out to side, leading with other hand palm down, until stretch is felt. Hold 5 seconds.  ELBOW STRAIGHT Hold 3sec. Repeat 5 times, rest and do 5 more for a total of 10.      ROM: Extension - Wand (Standing)   Stand holding wand behind back. Raise arms as far as possible.  PALMS FORWARD.  HEAD AND CHEST UP Hold 3sec. Repeat 5 times, rest and do 5 more for a total of 10.        Cane Overhead - Standing   With arms straight, hold cane forward at waist. Raise cane UP. Hold 3 seconds.  LOOK IN MIRROR AND KEEP HEAD STRAIGHT Hold 3sec. Repeat 5 times, rest and do 5 more for a total of 10.

## 2022-08-24 NOTE — Therapy (Signed)
OUTPATIENT OCCUPATIONAL THERAPY NEURO TREATMENT  Patient Name: Kim Sutton MRN: [id number] DOB:[birth year], 89 y.o., female Today's Date: 08/25/2022  PCP: Dr. Louis Matte REFERRING PROVIDER: Kem Boroughs, MD  END OF SESSION:  OT End of Session - 08/25/22 0854     Visit Number 4    Number of Visits 17    Date for OT Re-Evaluation 10/08/22    Authorization Type MCR, [employer] - covered 100%    Authorization - Number of Visits 4    Progress Note Due on Visit 10    OT Start Time [phone]    OT Stop Time 0930    OT Time Calculation (min) 39 min    Activity Tolerance Patient tolerated treatment well    Behavior During Therapy [facility] for tasks assessed/performed                Past Medical History:  Diagnosis Date   Adrenal cortical adenoma of left adrenal gland    stable since 2009;  MRI in 04/2010 - no further scans   CAD (coronary artery disease)    a. s/p NSTEMI in 2001 tx with BMS to RCA   Colon polyps    HLD (hyperlipidemia)    Hx of cardiovascular stress test    Lexiscan Myoview (09/2013):  No ischemia, EF 77%, Low Risk.   Hyperglycemia    Hypertension    Myoclonus    on neurontin per neuro at [facility]   Nephrolithiasis    Nodule of right lung    a. 5m on CXR 10/2009;  CT 05/2010 ok - no further scans   Tobacco abuse    Past Surgical History:  Procedure Laterality Date   CHOLECYSTECTOMY     CORONARY ANGIOPLASTY WITH STENT PLACEMENT     TOTAL ABDOMINAL HYSTERECTOMY     Patient Active Problem List   Diagnosis Date Noted   CAD (coronary artery disease) 09/04/2013   HTN (hypertension) 09/04/2013   HLD (hyperlipidemia) 09/04/2013   Tobacco abuse 09/04/2013    ONSET DATE: 08/05/2022  REFERRING DIAG: G37.3 (ICD-10-CM) - Acute transverse myelitis in demyelinating disease of central nervous system  (History of cervical transverse myelitis resulting in right sided weakness; more weakness on right side now which is likely due to debility and aging and right shoulder and wrist  pain after falls (has been evaluated in ER)  Needs strengthening, ROM, endurance, appropriate use of cane)  THERAPY DIAG:  Other lack of coordination  Muscle weakness (generalized)  Other symptoms and signs involving the nervous system  Abnormal posture  Rationale for Evaluation and Treatment: Rehabilitation  SUBJECTIVE:   SUBJECTIVE STATEMENT: Per husband, she's been working at home.  Pt reports that she is having less pain since she started therapy.  My Rt side is weak Pt accompanied by: significant other (husband)   PERTINENT HISTORY: Kim Sutton an 89year old right-handed female with a history of hypertension, hyperlipidemia, coronary artery disease status post stent and cervical transverse myelitis presenting to clinic with chief concern of history of transverse myelitis and multiple falls.  PRECAUTIONS: Fall and Other: stent   HARD OF HEARING  WEIGHT BEARING RESTRICTIONS: No  PAIN:  Are you having pain? no  FALLS: Has patient fallen in last 6 months? Yes. Number of falls 2  LIVING ENVIRONMENT: Lives with: lives with their spouse Pt lives in 2 story house, but lives on first floor with 6 steps to enter from garage, railing on Rt going up Has following equipment at home: Single point cane, WEnvironmental consultant-  2 wheeled, Grab bars, and rollator, built in shower seat, grab bars in shower and toilet area  PLOF:  occasional min assist for BADLS in the last year  PATIENT GOALS: increase RUE/hand use  OBJECTIVE:   HAND DOMINANCE: Right   UPPER EXTREMITY ROM:    LUE AROM WNL's  RUE: shoulder flex approx 50*, abduction 45*, ER approx 50%, IR 75%; elbow, forearm and wrist WFL's. Rt hand movements slightly weaker but can do all intrinsic movement with difficulty  HAND FUNCTION: Grip strength: Right: 15.2 lbs; Left: 39.9 lbs  COORDINATION: 9 Hole Peg test: Right: 43.45 sec; Left: 47.67 sec    TODAY'S TREATMENT:                                                                                                                                Placing various-sized pegs in semi-circle pegboard with RUE with min cueing for posture.  Then, removing using 3 at at time to place down 1 at a time using in-hand manipulation.     In standing, low-mid range functional reaching to place clothespins with 1-8lb resistance on vertical and horizontal poles for incr hand strength, coordination, and functional use of RUE  Flipping cards with RUE for incr coordination and lateral reach with min cueing.  Standing, modified quadruped with hands on table with forward/backward wt. Shifts and wt. Bearing through [place] with body on arm movements for incr RUE strength/scapular stability.    Placing small pegs in pegboard for incr coordination with min difficulty and min-mod cueing for posture/trunk compensation.  Standing, shoulder ext with cane for scapular retraction x10, then sitting cane ex for shoulder flex with min cueing.  Sitting, low-mid range functional reach to grasp/release cylinder objects with min-mod cueing for trunk compensation.  PATIENT EDUCATION: Education details: continued to cue for posture for functional tasks  Person educated: Patient and Spouse Education method: Explanation, Demonstration, Tactile cues, and Verbal cues Education comprehension: verbalized understanding, returned demonstration, and verbal cues required  HOME EXERCISE PROGRAM: 08/18/22: Yellow putty HEP and coordination HEP 08/22/22:  Cane HEP   GOALS: Goals reviewed with patient? Yes  SHORT TERM GOALS: Target date: 09/09/22  Independent with HEP for Rt shoulder ROM, Rt grip strength, and bilateral coordination Goal status: INITIAL  2.  Pt to verbalize understanding with A/E to increase ease with ADLS (LH brush, button hook, etc)  Goal status: INITIAL  3.  Pt to eat 50% with Rt dominant hand and brush teeth 25% with Rt hand Goal status: INITIAL  4.  Pt to consistently doff pull over shirt  I'ly Goal status: INITIAL   LONG TERM GOALS: Target date: 10/08/22  Independent with updated HEP PRN Goal status: INITIAL  2.  Pt to eat 75% with Rt hand, and perform grooming 50% with Rt hand using A/E prn for brushing hair Baseline:  Goal status: INITIAL  3.  Pt to improve bilateral coordination by 5 sec on 9  hole peg test  Baseline: Rt = 43.45 sec, Lt = 47.67 sec Goal status: INITIAL  4.  Pt to improve Rt hand grip strength by 5 lbs  Baseline: 15.2 lbs Goal status: INITIAL  5.  Pt to improve RUE sh flexion to 70* for greater ease with dressing, grooming and low to mid level reaching Baseline:  Goal status: INITIAL   ASSESSMENT:  CLINICAL IMPRESSION: Pt is progressing towards goals.  Pt continues to need cueing for shoulder/trunk/head compensations due to weakness, but pt is self-correcting posture at times today without cueing.  PERFORMANCE DEFICITS: in functional skills including ADLs, IADLs, coordination, dexterity, edema, ROM, strength, pain, flexibility, Fine motor control, Gross motor control, mobility, body mechanics, endurance, decreased knowledge of use of DME, and UE functional use.   IMPAIRMENTS: are limiting patient from ADLs, IADLs, and leisure.   CO-MORBIDITIES: may have co-morbidities  that affects occupational performance. Patient will benefit from skilled OT to address above impairments and improve overall function.  MODIFICATION OR ASSISTANCE TO COMPLETE EVALUATION: No modification of tasks or assist necessary to complete an evaluation.  OT OCCUPATIONAL PROFILE AND HISTORY: Problem focused assessment: Including review of records relating to presenting problem.  CLINICAL DECISION MAKING: Moderate - several treatment options, min-mod task modification necessary  REHAB POTENTIAL: Fair time since onset and gradual decline over last few years  EVALUATION COMPLEXITY: Low   PLAN:  OT FREQUENCY: 2x/week  OT DURATION: 8 weeks, plus evaluation  PLANNED  INTERVENTIONS: self care/ADL training, therapeutic exercise, therapeutic activity, neuromuscular re-education, manual therapy, passive range of motion, functional mobility training, splinting, paraffin, fluidotherapy, moist heat, patient/family education, coping strategies training, and DME and/or AE instructions  RECOMMENDED OTHER SERVICES: none at this time  CONSULTED AND AGREED WITH PLAN OF CARE: Patient and family member/caregiver  PLAN FOR NEXT SESSION: continue with AAROM, scapular stability, coordination, hand strength, functional reach   [facility], OTR/L 08/25/2022, 9:04 AM

## 2022-08-25 ENCOUNTER — Encounter: Payer: Self-pay | Admitting: Occupational Therapy

## 2022-08-25 ENCOUNTER — Ambulatory Visit: Payer: Medicare Other

## 2022-08-25 ENCOUNTER — Ambulatory Visit: Payer: Medicare Other | Admitting: Occupational Therapy

## 2022-08-25 DIAGNOSIS — R293 Abnormal posture: Secondary | ICD-10-CM | POA: Diagnosis not present

## 2022-08-25 DIAGNOSIS — R2681 Unsteadiness on feet: Secondary | ICD-10-CM | POA: Diagnosis not present

## 2022-08-25 DIAGNOSIS — R29818 Other symptoms and signs involving the nervous system: Secondary | ICD-10-CM | POA: Diagnosis not present

## 2022-08-25 DIAGNOSIS — R278 Other lack of coordination: Secondary | ICD-10-CM

## 2022-08-25 DIAGNOSIS — M6281 Muscle weakness (generalized): Secondary | ICD-10-CM

## 2022-08-25 DIAGNOSIS — R2689 Other abnormalities of gait and mobility: Secondary | ICD-10-CM

## 2022-08-25 NOTE — Therapy (Signed)
OUTPATIENT PHYSICAL THERAPY NEURO TREATMENT   Patient Name: Kim Sutton MRN: [id number] DOB:July 24, 1934, 89 y.o., female Today's Date: 08/25/2022   PCP: Kim Manes, MD (retired) REFERRING PROVIDER: Kem Boroughs, MD  END OF SESSION:  PT End of Session - 08/25/22 0835     Visit Number 4    Number of Visits 17    Date for PT Re-Evaluation 11/01/22    Authorization Type Medicare    Progress Note Due on Visit 10    PT Start Time 0930    PT Stop Time 1013    PT Time Calculation (min) 43 min    Equipment Utilized During Treatment Gait belt    Activity Tolerance Patient tolerated treatment well    Behavior During Therapy Kim Sutton for tasks assessed/performed              Past Medical History:  Diagnosis Date   Adrenal cortical adenoma of left adrenal gland    stable since 2009;  MRI in 04/2010 - no further scans   CAD (coronary artery disease)    a. s/p NSTEMI in 2001 tx with BMS to RCA   Colon polyps    HLD (hyperlipidemia)    Hx of cardiovascular stress test    Lexiscan Myoview (09/2013):  No ischemia, EF 77%, Low Risk.   Hyperglycemia    Hypertension    Myoclonus    on neurontin per neuro at [facility]   Nephrolithiasis    Nodule of right lung    a. 52m on CXR 10/2009;  CT 05/2010 ok - no further scans   Tobacco abuse    Past Surgical History:  Procedure Laterality Date   CHOLECYSTECTOMY     CORONARY ANGIOPLASTY WITH STENT PLACEMENT     TOTAL ABDOMINAL HYSTERECTOMY     Patient Active Problem List   Diagnosis Date Noted   CAD (coronary artery disease) 09/04/2013   HTN (hypertension) 09/04/2013   HLD (hyperlipidemia) 09/04/2013   Tobacco abuse 09/04/2013    ONSET DATE: 08/05/2022  REFERRING DIAG: G37.3 (ICD-10-CM) - Acute transverse myelitis in demyelinating disease of central nervous system  THERAPY DIAG:  Muscle weakness (generalized)  Other lack of coordination  Other symptoms and signs involving the nervous system  Abnormal posture  Unsteadiness  on feet  Other abnormalities of gait and mobility  Rationale for Evaluation and Treatment: Rehabilitation  SUBJECTIVE:                                                                                                                                                                                             SUBJECTIVE STATEMENT: Patient report doing well-  states she has no pain after OT today. Denies falls/no falls.   Pt accompanied by: self and significant other husband Kim Sutton  PERTINENT HISTORY: CAD, hyperlipidemia, hypertension, transverse myelitis  PAIN:  Are you having pain? No  PRECAUTIONS: Fall -Pt hears best out of LEFT ear (hard of hearing)  PATIENT GOALS: "be able to get up and walk around and do everything I used to do"  TODAY'S TREATMENT:                                                                                                                              THER EX: -scifit hills level 1 x5 mins  - in // bars: lateral stepping over various height and width obstacles (added 3# ankle weight to R LE  -standing R knee, hip, ankle flexion moving bean bag to target on 4" step  - standing blaze pods with 3 on floor to various height, 3 on mirror   3x1.44mns with 1 min rest break  - seated hip abduction 2x15    PATIENT EDUCATION: Education details: continue HEP Person educated: Patient and Spouse Education method: Explanation, Demonstration, and Handouts Education comprehension: verbalized understanding, returned demonstration, and needs further education  HOME EXERCISE PROGRAM: Access Code: [email or web address]: [email or web address] Date: 08/18/2022 Prepared by: MElease Etienne Exercises - Seated Hamstring Stretch  - 1 x daily - 4-5 x weekly - 1 sets - 2 reps - 45 seconds hold - Seated Single Knee to Chest  - 1 x daily - 4-5 x weekly - 1 sets - 2 reps - 20-30 seconds hold - Gastroc Stretch on Wall  - 1 x daily - 5 x weekly - 1 sets - 1-2 reps - 45 seconds  hold - Standing Hip Extension with Counter Support  - 1 x daily - 5 x weekly - 2 sets - 10 reps - Seated Upper Trapezius Stretch  - 1 x daily - 7 x weekly - 1 sets - 5 reps - 30 sec hold - Seated Cervical Rotation AROM  - 1 x daily - 7 x weekly - 1 sets - 5 reps - 30 sec hold - Sit to Stand with Armchair  - 1 x daily - 7 x weekly - 3 sets - 10 reps  GOALS: Goals reviewed with patient? Yes  SHORT TERM GOALS: Target date: 09/06/2022   Pt will be independent with initial HEP for improved strength, balance, transfers and gait. Baseline: Goal status: INITIAL  2.  Pt will improve 5 x STS to less than or equal to 20 seconds to demonstrate improved functional strength and transfer efficiency.  Baseline: 25.43 sec (1/9) Goal status: INITIAL  3.  Pt will improve gait velocity to at least 2.5 ft/sec for improved gait efficiency and performance at Supervision level  Baseline: 2.2 ft/sec with SPC and CGA (1/9) Goal status: INITIAL  4.  Pt will improve her cervical ROM of R rotation and R lateral flexion >/=10 degrees to  demonstrate improved function Baseline: 40 degrees R rotation, 5 degrees R lateral flexion (1/9) Goal status: INITIAL  5.  Pt will be able to ambulate x 150 ft with LRAD at Supervision level Baseline: 35 ft with SPC and CGA (1/9) Goal status: INITIAL   LONG TERM GOALS: Target date: 10/04/2022   Pt will be independent with final HEP for improved strength, balance, transfers and gait. Baseline:  Goal status: INITIAL  2.  Pt will improve 5 x STS to less than or equal to 15 seconds to demonstrate improved functional strength and transfer efficiency.  Baseline: 25.43 sec (1/9) Goal status: INITIAL  3.  Pt will improve gait velocity to at least 2.75 ft/sec for improved gait efficiency and performance at mod I level  Baseline: 2.2 ft/sec with SPC and CGA (1/9) Goal status: INITIAL  4.  Pt will be able to ambulate x 500 ft with LRAD at mod I level Baseline: 35 ft with [facility] and  CGA (1/9) Goal status: INITIAL  5.  Pt will improve normal TUG to less than or equal to 15 seconds for improved functional mobility and decreased fall risk. Baseline: 17.4 sec with SPC (1/9) Goal status: INITIAL  ASSESSMENT:  CLINICAL IMPRESSION: Patient seen for skilled PT session with emphasis on R hemibody NMR. Patient noted to have decreased R foot clearance and increased plantarflexion through swing phase. Improved with addition of 3# ankle weight for improved proprioceptive input. Would benefit from additional proprioceptive work and proximal strengthening on R. Continue POC.    OBJECTIVE IMPAIRMENTS: Abnormal gait, decreased balance, decreased endurance, decreased mobility, difficulty walking, decreased ROM, decreased strength, impaired perceived functional ability, impaired flexibility, impaired UE functional use, improper body mechanics, postural dysfunction, and pain.   ACTIVITY LIMITATIONS: carrying, lifting, bending, squatting, stairs, transfers, and bed mobility  PARTICIPATION LIMITATIONS: driving and community activity  PERSONAL FACTORS: Age, Time since onset of injury/illness/exacerbation, and 1-2 comorbidities:    CAD, hyperlipidemia, hypertensionare also affecting patient's functional outcome.   REHAB POTENTIAL: Good  CLINICAL DECISION MAKING: Stable/uncomplicated  EVALUATION COMPLEXITY: Low  PLAN:  PT FREQUENCY: 2x/week  PT DURATION: 8 weeks  PLANNED INTERVENTIONS: Therapeutic exercises, Therapeutic activity, Neuromuscular re-education, Balance training, Gait training, Patient/Family education, Self Care, Joint mobilization, Stair training, Vestibular training, Canalith repositioning, Visual/preceptual remediation/compensation, Orthotic/Fit training, DME instructions, Aquatic Therapy, Dry Needling, Electrical stimulation, Spinal mobilization, Cryotherapy, Moist heat, Taping, Manual therapy, and Re-evaluation  PLAN FOR NEXT SESSION: how is HEP? modify HEP prn for  RLE ROM, strengthening (hip ROM, HS and gastroc stretches, sit to stands), endurance, SciFit vs NuStep, standing marches, resisted sidesteps, RLE hip and knee flexor strengthening, eccentric step downs, Blaze pods on various height surfaces   Debbora Dus, PT, DPT, CBIS 08/25/2022, 10:16 AM

## 2022-08-27 DIAGNOSIS — R000 Tachycardia, unspecified: Secondary | ICD-10-CM

## 2022-08-28 NOTE — Therapy (Signed)
OUTPATIENT OCCUPATIONAL THERAPY NEURO TREATMENT  Patient Name: Kim Sutton MRN: [id number] DOB:04/29/1934, 89 y.o., female Today's Date: 08/29/2022  PCP: Dr. Louis Sutton REFERRING PROVIDER: Kem Boroughs, MD  END OF SESSION:  OT End of Session - 08/29/22 0853     Visit Number 5    Number of Visits 17    Date for OT Re-Evaluation 10/08/22    Authorization Type MCR, [employer] - covered 100%    Authorization - Number of Visits 5    Progress Note Due on Visit 10    OT Start Time [phone]    OT Stop Time 0930    OT Time Calculation (min) 39 min    Activity Tolerance Patient tolerated treatment well    Behavior During Therapy [facility] for tasks assessed/performed                 Past Medical History:  Diagnosis Date   Adrenal cortical adenoma of left adrenal gland    stable since 2009;  MRI in 04/2010 - no further scans   CAD (coronary artery disease)    a. s/p NSTEMI in 2001 tx with BMS to RCA   Colon polyps    HLD (hyperlipidemia)    Hx of cardiovascular stress test    Lexiscan Myoview (09/2013):  No ischemia, EF 77%, Low Risk.   Hyperglycemia    Hypertension    Myoclonus    on neurontin per neuro at [facility]   Nephrolithiasis    Nodule of right lung    a. 41m on CXR 10/2009;  CT 05/2010 ok - no further scans   Tobacco abuse    Past Surgical History:  Procedure Laterality Date   CHOLECYSTECTOMY     CORONARY ANGIOPLASTY WITH STENT PLACEMENT     TOTAL ABDOMINAL HYSTERECTOMY     Patient Active Problem List   Diagnosis Date Noted   CAD (coronary artery disease) 09/04/2013   HTN (hypertension) 09/04/2013   HLD (hyperlipidemia) 09/04/2013   Tobacco abuse 09/04/2013    ONSET DATE: 08/05/2022  REFERRING DIAG: G37.3 (ICD-10-CM) - Acute transverse myelitis in demyelinating disease of central nervous system  (History of cervical transverse myelitis resulting in right sided weakness; more weakness on right side now which is likely due to debility and aging and right shoulder and  wrist pain after falls (has been evaluated in ER)  Needs strengthening, ROM, endurance, appropriate use of cane)  THERAPY DIAG:  Other lack of coordination  Muscle weakness (generalized)  Other symptoms and signs involving the nervous system  Abnormal posture  Unsteadiness on feet  Rationale for Evaluation and Treatment: Rehabilitation  SUBJECTIVE:   SUBJECTIVE STATEMENT: Up most of the night due to headache.  Is wearing heart monitor and undergoing testing, but so far heart has been good with the other tests (hx of MI years ago)  My Rt side is weak Pt accompanied by: significant other (husband)   PERTINENT HISTORY: [name] an 89year old right-handed female with a history of hypertension, hyperlipidemia, coronary artery disease status post stent and cervical transverse myelitis presenting to clinic with chief concern of history of transverse myelitis and multiple falls.  PRECAUTIONS: Fall and Other: stent   HARD OF HEARING  WEIGHT BEARING RESTRICTIONS: No  PAIN:  PAIN:  Are you having pain? Yes: NPRS scale: 6/10 Pain location: headache Pain description: headache Aggravating factors: unknown Relieving factors: took Asprin    FALLS: Has patient fallen in last 6 months? Yes. Number of falls 2  LIVING ENVIRONMENT: Lives  with: lives with their spouse Pt lives in 2 story house, but lives on first floor with 6 steps to enter from garage, railing on Rt going up Has following equipment at home: Single point cane, Environmental consultant - 2 wheeled, Grab bars, and rollator, built in shower seat, grab bars in shower and toilet area  PLOF:  occasional min assist for BADLS in the last year  PATIENT GOALS: increase RUE/hand use  OBJECTIVE:   HAND DOMINANCE: Right   UPPER EXTREMITY ROM:    LUE AROM WNL's  RUE: shoulder flex approx 50*, abduction 45*, ER approx 50%, IR 75%; elbow, forearm and wrist WFL's. Rt hand movements slightly weaker but can do all intrinsic movement with  difficulty  HAND FUNCTION: Grip strength: Right: 15.2 lbs; Left: 39.9 lbs  COORDINATION: 9 Hole Peg test: Right: 43.45 sec; Left: 47.67 sec    TODAY'S TREATMENT:                                                                                                                               Reviewed Cane HEP with min cueing and occasional min facilitation for normal movement patterns.  Pt returned demo supine shoulder flex/chest press x10 then additional 5reps after rest, shoulder abduction x 2 sets of 5 reps, shoulder ext x10 and sitting shoulder flex x10.  Supine, scapular retraction with min cueing, but due to cramp discontinued after 6 reps  Light wt. Bearing through R hand with body on arm movements with min cueing for incr RUE strength/scapular stability.  Sitting, sliding cane along lap for AAROM elbow ext/shoulder flex and scapular retraction/depression with min cueing x10.  Sitting, AAROM shoulder flex and abduction with UE ranger with min cueing/facilitation for compensation.    PATIENT EDUCATION: Education details: Reviewed cane HEP, continued to cue for posture for functional tasks  Person educated: Patient and Spouse Education method: Explanation, Demonstration, Tactile cues, and Verbal cues Education comprehension: verbalized understanding, returned demonstration, and verbal cues required  HOME EXERCISE PROGRAM: 08/18/22: Yellow putty HEP and coordination HEP 08/22/22:  Cane HEP   GOALS: Goals reviewed with patient? Yes  SHORT TERM GOALS: Target date: 09/09/22  Independent with HEP for Rt shoulder ROM, Rt grip strength, and bilateral coordination Goal status: INITIAL  2.  Pt to verbalize understanding with A/E to increase ease with ADLS (LH brush, button hook, etc)  Goal status: INITIAL  3.  Pt to eat 50% with Rt dominant hand and brush teeth 25% with Rt hand Goal status: INITIAL  4.  Pt to consistently doff pull over shirt I'ly Goal status: INITIAL   LONG  TERM GOALS: Target date: 10/08/22  Independent with updated HEP PRN Goal status: INITIAL  2.  Pt to eat 75% with Rt hand, and perform grooming 50% with Rt hand using A/E prn for brushing hair Baseline:  Goal status: INITIAL  3.  Pt to improve bilateral coordination by 5 sec on 9 hole peg test  Baseline: Rt = 43.45 sec, Lt = 47.67 sec Goal status: INITIAL  4.  Pt to improve Rt hand grip strength by 5 lbs  Baseline: 15.2 lbs Goal status: INITIAL  5.  Pt to improve RUE sh flexion to 70* for greater ease with dressing, grooming and low to mid level reaching Baseline:  Goal status: INITIAL   ASSESSMENT:  CLINICAL IMPRESSION: Pt is progressing towards goals with improving RUE strength/endurance.    PERFORMANCE DEFICITS: in functional skills including ADLs, IADLs, coordination, dexterity, edema, ROM, strength, pain, flexibility, Fine motor control, Gross motor control, mobility, body mechanics, endurance, decreased knowledge of use of DME, and UE functional use.   IMPAIRMENTS: are limiting patient from ADLs, IADLs, and leisure.   CO-MORBIDITIES: may have co-morbidities  that affects occupational performance. Patient will benefit from skilled OT to address above impairments and improve overall function.  MODIFICATION OR ASSISTANCE TO COMPLETE EVALUATION: No modification of tasks or assist necessary to complete an evaluation.  OT OCCUPATIONAL PROFILE AND HISTORY: Problem focused assessment: Including review of records relating to presenting problem.  CLINICAL DECISION MAKING: Moderate - several treatment options, min-mod task modification necessary  REHAB POTENTIAL: Fair time since onset and gradual decline over last few years  EVALUATION COMPLEXITY: Low   PLAN:  OT FREQUENCY: 2x/week  OT DURATION: 8 weeks, plus evaluation  PLANNED INTERVENTIONS: self care/ADL training, therapeutic exercise, therapeutic activity, neuromuscular re-education, manual therapy, passive range of  motion, functional mobility training, splinting, paraffin, fluidotherapy, moist heat, patient/family education, coping strategies training, and DME and/or AE instructions  RECOMMENDED OTHER SERVICES: none at this time  CONSULTED AND AGREED WITH PLAN OF CARE: Patient and family member/caregiver  PLAN FOR NEXT SESSION:  continue with AAROM, scapular stability, coordination, hand strength, functional reach   [facility], OTR/L 08/29/2022, 8:55 AM

## 2022-08-29 ENCOUNTER — Encounter: Payer: Self-pay | Admitting: Occupational Therapy

## 2022-08-29 ENCOUNTER — Encounter: Payer: Self-pay | Admitting: Physical Therapy

## 2022-08-29 ENCOUNTER — Ambulatory Visit: Payer: Medicare Other | Admitting: Physical Therapy

## 2022-08-29 ENCOUNTER — Ambulatory Visit: Payer: Medicare Other | Admitting: Occupational Therapy

## 2022-08-29 VITALS — BP 197/74 | HR 49

## 2022-08-29 DIAGNOSIS — R2681 Unsteadiness on feet: Secondary | ICD-10-CM

## 2022-08-29 DIAGNOSIS — R278 Other lack of coordination: Secondary | ICD-10-CM

## 2022-08-29 DIAGNOSIS — R29818 Other symptoms and signs involving the nervous system: Secondary | ICD-10-CM

## 2022-08-29 DIAGNOSIS — R2689 Other abnormalities of gait and mobility: Secondary | ICD-10-CM

## 2022-08-29 DIAGNOSIS — M6281 Muscle weakness (generalized): Secondary | ICD-10-CM | POA: Diagnosis not present

## 2022-08-29 DIAGNOSIS — R293 Abnormal posture: Secondary | ICD-10-CM

## 2022-08-29 NOTE — Therapy (Signed)
OUTPATIENT PHYSICAL THERAPY NEURO TREATMENT   Patient Name: Kim Sutton MRN: [id number] DOB:07/09/1934, 89 y.o., female Today's Date: 08/29/2022   PCP: Lajean Manes, MD (retired) REFERRING PROVIDER: Kem Boroughs, MD  END OF SESSION:  PT End of Session - 08/29/22 0933     Visit Number 5    Number of Visits 17    Date for PT Re-Evaluation 11/01/22    Authorization Type Medicare    Progress Note Due on Visit 10    PT Start Time 0933   handoff from OT   PT Stop Time 1014    PT Time Calculation (min) 41 min    Equipment Utilized During Treatment Gait belt    Activity Tolerance Patient tolerated treatment well    Behavior During Therapy [facility] for tasks assessed/performed              Past Medical History:  Diagnosis Date   Adrenal cortical adenoma of left adrenal gland    stable since 2009;  MRI in 04/2010 - no further scans   CAD (coronary artery disease)    a. s/p NSTEMI in 2001 tx with BMS to RCA   Colon polyps    HLD (hyperlipidemia)    Hx of cardiovascular stress test    Lexiscan Myoview (09/2013):  No ischemia, EF 77%, Low Risk.   Hyperglycemia    Hypertension    Myoclonus    on neurontin per neuro at [facility]   Nephrolithiasis    Nodule of right lung    a. 76m on CXR 10/2009;  CT 05/2010 ok - no further scans   Tobacco abuse    Past Surgical History:  Procedure Laterality Date   CHOLECYSTECTOMY     CORONARY ANGIOPLASTY WITH STENT PLACEMENT     TOTAL ABDOMINAL HYSTERECTOMY     Patient Active Problem List   Diagnosis Date Noted   CAD (coronary artery disease) 09/04/2013   HTN (hypertension) 09/04/2013   HLD (hyperlipidemia) 09/04/2013   Tobacco abuse 09/04/2013    ONSET DATE: 08/05/2022  REFERRING DIAG: G37.3 (ICD-10-CM) - Acute transverse myelitis in demyelinating disease of central nervous system  THERAPY DIAG:  Muscle weakness (generalized)  Other lack of coordination  Other symptoms and signs involving the nervous system  Abnormal  posture  Unsteadiness on feet  Other abnormalities of gait and mobility  Rationale for Evaluation and Treatment: Rehabilitation  SUBJECTIVE:                                                                                                                                                                                             SUBJECTIVE STATEMENT:  Patient report doing well- states she is a little sore from working out at home. Denies falls.   Pt accompanied by: self and significant other husband Kim Sutton  PERTINENT HISTORY: CAD, hyperlipidemia, hypertension, transverse myelitis  PAIN:  Are you having pain? Yes: NPRS scale: 6/10 Pain location: headache Pain description: headache Aggravating factors: being fatigued Relieving factors: aspirin  PRECAUTIONS: Fall -Pt hears best out of LEFT ear (hard of hearing)  PATIENT GOALS: "be able to get up and walk around and do everything I used to do"  TODAY'S TREATMENT:                                                                                                                              -Modified forward T using unilateral UE support to improve balance and posture of reaching using rear leg kickstand, pt is apprehensive of LLE in rear due to RLE functional weakness -Assessed BP following initial task as pt continuously endorsing she is very tired and head hurts: Vitals:   08/29/22 0944 08/29/22 0946  BP: (!) 189/74 (!) 197/74  Pulse: (!) 50 (!) 49  (Reassessed BP a second time to see if elevation task related; Pt tensed during second reading so likely inaccurate) -Standing normal BOS 1.1 lb weighted ball reach to chair back at progressed distance to challenge static balance -Heel taps from airex progressing to unilateral UE support and CGA-minA, pt verbalizing fear of falling when on RLE -Step ups to airex w/ contralateral hip drives progressing to unilateral UE support -SciFit incrementally progressed to level 3.0 using BUE/BLE support  x76mnutes for cardiovascular endurance and reciprocal mobility, pt completes 410 steps, 2/10 RPE at end of task.  PATIENT EDUCATION: Education details: continue HEP Person educated: Patient and Spouse Education method: Explanation, Demonstration, and Handouts Education comprehension: verbalized understanding, returned demonstration, and needs further education  HOME EXERCISE PROGRAM: Access Code: [email or web address]: [email or web address] Date: 08/18/2022 Prepared by: MElease Etienne Exercises - Seated Hamstring Stretch  - 1 x daily - 4-5 x weekly - 1 sets - 2 reps - 45 seconds hold - Seated Single Knee to Chest  - 1 x daily - 4-5 x weekly - 1 sets - 2 reps - 20-30 seconds hold - Gastroc Stretch on Wall  - 1 x daily - 5 x weekly - 1 sets - 1-2 reps - 45 seconds hold - Standing Hip Extension with Counter Support  - 1 x daily - 5 x weekly - 2 sets - 10 reps - Seated Upper Trapezius Stretch  - 1 x daily - 7 x weekly - 1 sets - 5 reps - 30 sec hold - Seated Cervical Rotation AROM  - 1 x daily - 7 x weekly - 1 sets - 5 reps - 30 sec hold - Sit to Stand with Armchair  - 1 x daily - 7 x weekly - 3 sets - 10 reps  GOALS: Goals reviewed with patient? Yes  SHORT TERM GOALS:  Target date: 09/06/2022   Pt will be independent with initial HEP for improved strength, balance, transfers and gait. Baseline: Goal status: INITIAL  2.  Pt will improve 5 x STS to less than or equal to 20 seconds to demonstrate improved functional strength and transfer efficiency.  Baseline: 25.43 sec (1/9) Goal status: INITIAL  3.  Pt will improve gait velocity to at least 2.5 ft/sec for improved gait efficiency and performance at Supervision level  Baseline: 2.2 ft/sec with SPC and CGA (1/9) Goal status: INITIAL  4.  Pt will improve her cervical ROM of R rotation and R lateral flexion >/=10 degrees to demonstrate improved function Baseline: 40 degrees R rotation, 5 degrees R lateral flexion (1/9) Goal  status: INITIAL  5.  Pt will be able to ambulate x 150 ft with LRAD at Supervision level Baseline: 35 ft with SPC and CGA (1/9) Goal status: INITIAL   LONG TERM GOALS: Target date: 10/04/2022   Pt will be independent with final HEP for improved strength, balance, transfers and gait. Baseline:  Goal status: INITIAL  2.  Pt will improve 5 x STS to less than or equal to 15 seconds to demonstrate improved functional strength and transfer efficiency.  Baseline: 25.43 sec (1/9) Goal status: INITIAL  3.  Pt will improve gait velocity to at least 2.75 ft/sec for improved gait efficiency and performance at mod I level  Baseline: 2.2 ft/sec with SPC and CGA (1/9) Goal status: INITIAL  4.  Pt will be able to ambulate x 500 ft with LRAD at mod I level Baseline: 35 ft with [facility] and CGA (1/9) Goal status: INITIAL  5.  Pt will improve normal TUG to less than or equal to 15 seconds for improved functional mobility and decreased fall risk. Baseline: 17.4 sec with SPC (1/9) Goal status: INITIAL  ASSESSMENT:  CLINICAL IMPRESSION: Emphasis of skilled PT session today on addressing R hemibody NMR and functional strength with patient reporting fear of falling due to imbalance with forward T and reaching outside BOS.  She further has audible dyspnea on exertion and verbalizing intermittent mild discomfort with dynamic tasks today, but self-rates her fatigue very low following session.  Difficulty to accurately assess BP and other vitals this session due to patient soreness from prior sessions and home workouts making it difficult for her to relax for assessment.  She continues to benefit from skilled PT for further right LE weakness particularly of the hip musculature and confidence with upright mobility using LRAD.    OBJECTIVE IMPAIRMENTS: Abnormal gait, decreased balance, decreased endurance, decreased mobility, difficulty walking, decreased ROM, decreased strength, impaired perceived functional ability,  impaired flexibility, impaired UE functional use, improper body mechanics, postural dysfunction, and pain.   ACTIVITY LIMITATIONS: carrying, lifting, bending, squatting, stairs, transfers, and bed mobility  PARTICIPATION LIMITATIONS: driving and community activity  PERSONAL FACTORS: Age, Time since onset of injury/illness/exacerbation, and 1-2 comorbidities:    CAD, hyperlipidemia, hypertensionare also affecting patient's functional outcome.   REHAB POTENTIAL: Good  CLINICAL DECISION MAKING: Stable/uncomplicated  EVALUATION COMPLEXITY: Low  PLAN:  PT FREQUENCY: 2x/week  PT DURATION: 8 weeks  PLANNED INTERVENTIONS: Therapeutic exercises, Therapeutic activity, Neuromuscular re-education, Balance training, Gait training, Patient/Family education, Self Care, Joint mobilization, Stair training, Vestibular training, Canalith repositioning, Visual/preceptual remediation/compensation, Orthotic/Fit training, DME instructions, Aquatic Therapy, Dry Needling, Electrical stimulation, Spinal mobilization, Cryotherapy, Moist heat, Taping, Manual therapy, and Re-evaluation  PLAN FOR NEXT SESSION: how is HEP? modify HEP prn for RLE ROM, strengthening (hip ROM, HS and  gastroc stretches, sit to stands), endurance, SciFit vs NuStep, standing marches, resisted sidesteps, RLE hip and knee flexor strengthening, eccentric step downs, Blaze pods on various height surfaces   Bary Richard, PT, DPT 08/29/2022, 11:10 AM

## 2022-08-31 NOTE — Therapy (Signed)
OUTPATIENT OCCUPATIONAL THERAPY NEURO TREATMENT  Patient Name: Kim Sutton MRN: [id number] DOB:May 29, 1934, 89 y.o., female Today's Date: 09/01/2022  PCP: Dr. Louis Matte REFERRING PROVIDER: Kem Boroughs, MD  END OF SESSION:  OT End of Session - 09/01/22 0859     Visit Number 6    Number of Visits 17    Date for OT Re-Evaluation 10/08/22    Authorization Type MCR, [employer] - covered 100%    Authorization - Number of Visits 6    Progress Note Due on Visit 10    OT Start Time [phone]    OT Stop Time 0935    OT Time Calculation (min) 39 min    Activity Tolerance Patient tolerated treatment well    Behavior During Therapy [facility] for tasks assessed/performed                  Past Medical History:  Diagnosis Date   Adrenal cortical adenoma of left adrenal gland    stable since 2009;  MRI in 04/2010 - no further scans   CAD (coronary artery disease)    a. s/p NSTEMI in 2001 tx with BMS to RCA   Colon polyps    HLD (hyperlipidemia)    Hx of cardiovascular stress test    Lexiscan Myoview (09/2013):  No ischemia, EF 77%, Low Risk.   Hyperglycemia    Hypertension    Myoclonus    on neurontin per neuro at [facility]   Nephrolithiasis    Nodule of right lung    a. 95m on CXR 10/2009;  CT 05/2010 ok - no further scans   Tobacco abuse    Past Surgical History:  Procedure Laterality Date   CHOLECYSTECTOMY     CORONARY ANGIOPLASTY WITH STENT PLACEMENT     TOTAL ABDOMINAL HYSTERECTOMY     Patient Active Problem List   Diagnosis Date Noted   CAD (coronary artery disease) 09/04/2013   HTN (hypertension) 09/04/2013   HLD (hyperlipidemia) 09/04/2013   Tobacco abuse 09/04/2013    ONSET DATE: 08/05/2022  REFERRING DIAG: G37.3 (ICD-10-CM) - Acute transverse myelitis in demyelinating disease of central nervous system  (History of cervical transverse myelitis resulting in right sided weakness; more weakness on right side now which is likely due to debility and aging and right shoulder and  wrist pain after falls (has been evaluated in ER)  Needs strengthening, ROM, endurance, appropriate use of cane)  THERAPY DIAG:  Other lack of coordination  Muscle weakness (generalized)  Other symptoms and signs involving the nervous system  Abnormal posture  Unsteadiness on feet  Other abnormalities of gait and mobility  Rationale for Evaluation and Treatment: Rehabilitation  SUBJECTIVE:   SUBJECTIVE STATEMENT: Sore from last time   Pt accompanied by: significant other (husband)   PERTINENT HISTORY: Ms. MBrubeckis an 89year old right-handed female with a history of hypertension, hyperlipidemia, coronary artery disease status post stent and cervical transverse myelitis presenting to clinic with chief concern of history of transverse myelitis and multiple falls.  PRECAUTIONS: Fall and Other: stent   HARD OF HEARING  WEIGHT BEARING RESTRICTIONS: No  PAIN:  PAIN:  Are you having pain? Yes: NPRS scale: 8/10 Pain location: R scapula Pain description: sore Aggravating factors: exercise Relieving factors: rest    FALLS: Has patient fallen in last 6 months? Yes. Number of falls 2  LIVING ENVIRONMENT: Lives with: lives with their spouse Pt lives in 2 story house, but lives on first floor with 6 steps to enter from garage, railing  on Rt going up Has following equipment at home: Single point cane, Walker - 2 wheeled, Grab bars, and rollator, built in shower seat, grab bars in shower and toilet area  PLOF:  occasional min assist for BADLS in the last year  PATIENT GOALS: increase RUE/hand use  OBJECTIVE:   HAND DOMINANCE: Right   UPPER EXTREMITY ROM:    LUE AROM WNL's  RUE: shoulder flex approx 50*, abduction 45*, ER approx 50%, IR 75%; elbow, forearm and wrist WFL's. Rt hand movements slightly weaker but can do all intrinsic movement with difficulty  HAND FUNCTION: Grip strength: Right: 15.2 lbs; Left: 39.9 lbs  COORDINATION: 9 Hole Peg test: Right: 43.45 sec;  Left: 47.67 sec    TODAY'S TREATMENT:                                                                                                                               While looking in mirror, lateral head flex to the right as able x10.  Placing grooved pegs in pegboard with min cueing for trunk compensation and in-hand manipulation with mod difficulty.  Standing, functional low-mid range reaching to place/remove clothespins with 1-8lb resistance with positioning for incr wt. Shift to R side and R side trunk activation/L side elongation with min cueing.    Sitting, table slides for shoulder flex with mirror/min cues and shoulder abduction/adduction with trunk enlongation, rotation.  Standing, cane ex for shoulder ext with scapular retraction and shoulder abduction with mirror and min cues x10.  Sitting, sliding cane along lap for AAROM elbow ext/shoulder flex and scapular retraction/depression with min cueing and mirror use x10, cane ex for shoulder flex with min cueing/mirror for trunk compensation.    PATIENT EDUCATION: Education details:  continued to cue for posture for functional tasks  Person educated: Patient and Spouse Education method: Explanation, Demonstration, Tactile cues, and Verbal cues Education comprehension: verbalized understanding, returned demonstration, and verbal cues required  HOME EXERCISE PROGRAM: 08/18/22: Yellow putty HEP and coordination HEP 08/22/22:  Cane HEP   GOALS: Goals reviewed with patient? Yes  SHORT TERM GOALS: Target date: 09/09/22  Independent with HEP for Rt shoulder ROM, Rt grip strength, and bilateral coordination Goal status: INITIAL  2.  Pt to verbalize understanding with A/E to increase ease with ADLS (LH brush, button hook, etc)  Goal status: INITIAL  3.  Pt to eat 50% with Rt dominant hand and brush teeth 25% with Rt hand Goal status: INITIAL  4.  Pt to consistently doff pull over shirt I'ly Goal status: INITIAL   LONG TERM  GOALS: Target date: 10/08/22  Independent with updated HEP PRN Goal status: INITIAL  2.  Pt to eat 75% with Rt hand, and perform grooming 50% with Rt hand using A/E prn for brushing hair Baseline:  Goal status: INITIAL  3.  Pt to improve bilateral coordination by 5 sec on 9 hole peg test  Baseline: Rt = 43.45 sec, Lt =  47.67 sec Goal status: INITIAL  4.  Pt to improve Rt hand grip strength by 5 lbs  Baseline: 15.2 lbs Goal status: INITIAL  5.  Pt to improve RUE sh flexion to 70* for greater ease with dressing, grooming and low to mid level reaching Baseline:  Goal status: INITIAL   ASSESSMENT:  CLINICAL IMPRESSION: Pt is progressing towards goals with improving RUE strength/endurance.  Pt reports decr pain (6/10) at end of session.    PERFORMANCE DEFICITS: in functional skills including ADLs, IADLs, coordination, dexterity, edema, ROM, strength, pain, flexibility, Fine motor control, Gross motor control, mobility, body mechanics, endurance, decreased knowledge of use of DME, and UE functional use.   IMPAIRMENTS: are limiting patient from ADLs, IADLs, and leisure.   CO-MORBIDITIES: may have co-morbidities  that affects occupational performance. Patient will benefit from skilled OT to address above impairments and improve overall function.  MODIFICATION OR ASSISTANCE TO COMPLETE EVALUATION: No modification of tasks or assist necessary to complete an evaluation.  OT OCCUPATIONAL PROFILE AND HISTORY: Problem focused assessment: Including review of records relating to presenting problem.  CLINICAL DECISION MAKING: Moderate - several treatment options, min-mod task modification necessary  REHAB POTENTIAL: Fair time since onset and gradual decline over last few years  EVALUATION COMPLEXITY: Low   PLAN:  OT FREQUENCY: 2x/week  OT DURATION: 8 weeks, plus evaluation  PLANNED INTERVENTIONS: self care/ADL training, therapeutic exercise, therapeutic activity, neuromuscular  re-education, manual therapy, passive range of motion, functional mobility training, splinting, paraffin, fluidotherapy, moist heat, patient/family education, coping strategies training, and DME and/or AE instructions  RECOMMENDED OTHER SERVICES: none at this time  CONSULTED AND AGREED WITH PLAN OF CARE: Patient and family member/caregiver  PLAN FOR NEXT SESSION:  continue with AAROM, scapular stability, coordination, hand strength, functional reach   [facility], OTR/L 09/01/2022, 9:51 AM

## 2022-09-01 ENCOUNTER — Ambulatory Visit: Payer: Medicare Other | Admitting: Physical Therapy

## 2022-09-01 ENCOUNTER — Encounter: Payer: Self-pay | Admitting: Physical Therapy

## 2022-09-01 ENCOUNTER — Encounter: Payer: Self-pay | Admitting: Occupational Therapy

## 2022-09-01 ENCOUNTER — Ambulatory Visit: Payer: Medicare Other | Attending: Psychiatry | Admitting: Occupational Therapy

## 2022-09-01 DIAGNOSIS — R29818 Other symptoms and signs involving the nervous system: Secondary | ICD-10-CM | POA: Insufficient documentation

## 2022-09-01 DIAGNOSIS — M6281 Muscle weakness (generalized): Secondary | ICD-10-CM | POA: Diagnosis not present

## 2022-09-01 DIAGNOSIS — R293 Abnormal posture: Secondary | ICD-10-CM | POA: Diagnosis not present

## 2022-09-01 DIAGNOSIS — R2681 Unsteadiness on feet: Secondary | ICD-10-CM | POA: Insufficient documentation

## 2022-09-01 DIAGNOSIS — R278 Other lack of coordination: Secondary | ICD-10-CM | POA: Diagnosis not present

## 2022-09-01 DIAGNOSIS — R2689 Other abnormalities of gait and mobility: Secondary | ICD-10-CM | POA: Insufficient documentation

## 2022-09-01 NOTE — Therapy (Signed)
OUTPATIENT PHYSICAL THERAPY NEURO TREATMENT   Patient Name: Kim Sutton MRN: [id number] DOB:Dec 03, 1933, 89 y.o., female Today's Date: 09/01/2022   PCP: Lajean Manes, MD (retired) REFERRING PROVIDER: Kem Boroughs, MD  END OF SESSION:  PT End of Session - 09/01/22 0940     Visit Number 6    Number of Visits 17    Date for PT Re-Evaluation 11/01/22    Authorization Type Medicare    Progress Note Due on Visit 10    PT Start Time 0934    PT Stop Time 1015    PT Time Calculation (min) 41 min    Equipment Utilized During Treatment Gait belt    Activity Tolerance Patient tolerated treatment well;Other (comment);Patient limited by fatigue   hard of hearing, repetition needed   Behavior During Therapy [facility] for tasks assessed/performed              Past Medical History:  Diagnosis Date   Adrenal cortical adenoma of left adrenal gland    stable since 2009;  MRI in 04/2010 - no further scans   CAD (coronary artery disease)    a. s/p NSTEMI in 2001 tx with BMS to RCA   Colon polyps    HLD (hyperlipidemia)    Hx of cardiovascular stress test    Lexiscan Myoview (09/2013):  No ischemia, EF 77%, Low Risk.   Hyperglycemia    Hypertension    Myoclonus    on neurontin per neuro at [facility]   Nephrolithiasis    Nodule of right lung    a. 33m on CXR 10/2009;  CT 05/2010 ok - no further scans   Tobacco abuse    Past Surgical History:  Procedure Laterality Date   CHOLECYSTECTOMY     CORONARY ANGIOPLASTY WITH STENT PLACEMENT     TOTAL ABDOMINAL HYSTERECTOMY     Patient Active Problem List   Diagnosis Date Noted   CAD (coronary artery disease) 09/04/2013   HTN (hypertension) 09/04/2013   HLD (hyperlipidemia) 09/04/2013   Tobacco abuse 09/04/2013    ONSET DATE: 08/05/2022  REFERRING DIAG: G37.3 (ICD-10-CM) - Acute transverse myelitis in demyelinating disease of central nervous system  THERAPY DIAG:  Other lack of coordination  Muscle weakness (generalized)  Other  symptoms and signs involving the nervous system  Abnormal posture  Unsteadiness on feet  Other abnormalities of gait and mobility  Rationale for Evaluation and Treatment: Rehabilitation  SUBJECTIVE:  SUBJECTIVE STATEMENT: Patient is tired today. Denies falls.   Pt accompanied by: self and significant other husband Kim Sutton  PERTINENT HISTORY: CAD, hyperlipidemia, hypertension, transverse myelitis  PAIN:  Are you having pain? Yes: NPRS scale: 6/10 Pain location: right rib cage and shoulder blade Pain description: pressure Aggravating factors: moving, lifting Relieving factors: tylenol  PRECAUTIONS: Fall -Pt hears best out of LEFT ear (hard of hearing)  PATIENT GOALS: "be able to get up and walk around and do everything I used to do"  TODAY'S TREATMENT:                                                                                                                              -SciFit for dynamic warmup and reciprocal mobility x59mns using BUE/BLE remaining on level 1 as pt reports she has soreness following this activity and is wary of this.  Cued for large amplitude movements for neural priming.  Total steps achieved 335.  Pt reports fatigue of 2/10 following task. -Laterally oriented tilt board holding level progressing to no UE support > lateral weight shifts w/ CGA > anteriorly oriented tilt board w/ alternating toe taps to midline cone x10 w/ BUE support, x10 unilateral UE support > holding board level x170mute, moderate sway w/ good righting reactions -Standing on airex feet apart holding still x 52m46m> multidirectional ball tosses > feet together x52mi42mold > multidirectional ball tosses w/ CGA and increased approximation through LE -Walking lunge into walking march 6x8' using BUE support in  modified ROM Patient requires seated rest between each task.  PATIENT EDUCATION: Education details: Continue HEP. Person educated: Patient and Spouse Education method: Explanation, Demonstration, and Handouts Education comprehension: verbalized understanding, returned demonstration, and needs further education  HOME EXERCISE PROGRAM: Access Code: [email or web address]: [email or web address] Date: 08/18/2022 Prepared by: MariElease Etienneercises - Seated Hamstring Stretch  - 1 x daily - 4-5 x weekly - 1 sets - 2 reps - 45 seconds hold - Seated Single Knee to Chest  - 1 x daily - 4-5 x weekly - 1 sets - 2 reps - 20-30 seconds hold - Gastroc Stretch on Wall  - 1 x daily - 5 x weekly - 1 sets - 1-2 reps - 45 seconds hold - Standing Hip Extension with Counter Support  - 1 x daily - 5 x weekly - 2 sets - 10 reps - Seated Upper Trapezius Stretch  - 1 x daily - 7 x weekly - 1 sets - 5 reps - 30 sec hold - Seated Cervical Rotation AROM  - 1 x daily - 7 x weekly - 1 sets - 5 reps - 30 sec hold - Sit to Stand with Armchair  - 1 x daily - 7 x weekly - 3 sets - 10 reps  GOALS: Goals reviewed with patient? Yes  SHORT TERM GOALS: Target date: 09/06/2022   Pt will be independent with initial HEP for improved strength, balance, transfers and gait.  Baseline: Goal status: INITIAL  2.  Pt will improve 5 x STS to less than or equal to 20 seconds to demonstrate improved functional strength and transfer efficiency.  Baseline: 25.43 sec (1/9) Goal status: INITIAL  3.  Pt will improve gait velocity to at least 2.5 ft/sec for improved gait efficiency and performance at Supervision level  Baseline: 2.2 ft/sec with SPC and CGA (1/9) Goal status: INITIAL  4.  Pt will improve her cervical ROM of R rotation and R lateral flexion >/=10 degrees to demonstrate improved function Baseline: 40 degrees R rotation, 5 degrees R lateral flexion (1/9) Goal status: INITIAL  5.  Pt will be able to ambulate x  150 ft with LRAD at Supervision level Baseline: 35 ft with SPC and CGA (1/9) Goal status: INITIAL   LONG TERM GOALS: Target date: 10/04/2022   Pt will be independent with final HEP for improved strength, balance, transfers and gait. Baseline:  Goal status: INITIAL  2.  Pt will improve 5 x STS to less than or equal to 15 seconds to demonstrate improved functional strength and transfer efficiency.  Baseline: 25.43 sec (1/9) Goal status: INITIAL  3.  Pt will improve gait velocity to at least 2.75 ft/sec for improved gait efficiency and performance at mod I level  Baseline: 2.2 ft/sec with SPC and CGA (1/9) Goal status: INITIAL  4.  Pt will be able to ambulate x 500 ft with LRAD at mod I level Baseline: 35 ft with [facility] and CGA (1/9) Goal status: INITIAL  5.  Pt will improve normal TUG to less than or equal to 15 seconds for improved functional mobility and decreased fall risk. Baseline: 17.4 sec with SPC (1/9) Goal status: INITIAL  ASSESSMENT:  CLINICAL IMPRESSION: Pt remains limited by fatigue and soreness this visit requiring seated rest throughout.  Continued to address general activity tolerance, cardiovascular endurance, and static balance and ankle strategy this session.  She is most challenged by unsupported standing on compliant surfaces when asked to complete a dynamic task like alternating toe taps to 8" cone.  She continues to benefit from skilled PT to address outlined deficits in POC.  OBJECTIVE IMPAIRMENTS: Abnormal gait, decreased balance, decreased endurance, decreased mobility, difficulty walking, decreased ROM, decreased strength, impaired perceived functional ability, impaired flexibility, impaired UE functional use, improper body mechanics, postural dysfunction, and pain.   ACTIVITY LIMITATIONS: carrying, lifting, bending, squatting, stairs, transfers, and bed mobility  PARTICIPATION LIMITATIONS: driving and community activity  PERSONAL FACTORS: Age, Time since  onset of injury/illness/exacerbation, and 1-2 comorbidities:    CAD, hyperlipidemia, hypertensionare also affecting patient's functional outcome.   REHAB POTENTIAL: Good  CLINICAL DECISION MAKING: Stable/uncomplicated  EVALUATION COMPLEXITY: Low  PLAN:  PT FREQUENCY: 2x/week  PT DURATION: 8 weeks  PLANNED INTERVENTIONS: Therapeutic exercises, Therapeutic activity, Neuromuscular re-education, Balance training, Gait training, Patient/Family education, Self Care, Joint mobilization, Stair training, Vestibular training, Canalith repositioning, Visual/preceptual remediation/compensation, Orthotic/Fit training, DME instructions, Aquatic Therapy, Dry Needling, Electrical stimulation, Spinal mobilization, Cryotherapy, Moist heat, Taping, Manual therapy, and Re-evaluation  PLAN FOR NEXT SESSION: ASSESS STGs!  how is HEP? modify HEP prn for RLE ROM, strengthening (hip ROM, HS and gastroc stretches, sit to stands), endurance, SciFit vs NuStep, standing marches, resisted sidesteps, RLE hip and knee flexor strengthening, eccentric step downs, Blaze pods on various height surfaces   Bary Richard, PT, DPT 09/01/2022, 11:37 AM

## 2022-09-05 ENCOUNTER — Encounter: Payer: Self-pay | Admitting: Occupational Therapy

## 2022-09-05 ENCOUNTER — Ambulatory Visit: Payer: Medicare Other | Admitting: Occupational Therapy

## 2022-09-05 ENCOUNTER — Ambulatory Visit: Payer: Medicare Other | Admitting: Physical Therapy

## 2022-09-05 DIAGNOSIS — R293 Abnormal posture: Secondary | ICD-10-CM | POA: Diagnosis not present

## 2022-09-05 DIAGNOSIS — M6281 Muscle weakness (generalized): Secondary | ICD-10-CM | POA: Diagnosis not present

## 2022-09-05 DIAGNOSIS — R29818 Other symptoms and signs involving the nervous system: Secondary | ICD-10-CM | POA: Diagnosis not present

## 2022-09-05 DIAGNOSIS — R2681 Unsteadiness on feet: Secondary | ICD-10-CM

## 2022-09-05 DIAGNOSIS — R278 Other lack of coordination: Secondary | ICD-10-CM | POA: Diagnosis not present

## 2022-09-05 DIAGNOSIS — R2689 Other abnormalities of gait and mobility: Secondary | ICD-10-CM | POA: Diagnosis not present

## 2022-09-05 NOTE — Therapy (Signed)
OUTPATIENT OCCUPATIONAL THERAPY NEURO TREATMENT  Patient Name: Kim Sutton MRN: [id number] DOB:1934/06/05, 89 y.o., female Today's Date: 09/05/2022  PCP: Dr. Louis Matte REFERRING PROVIDER: Kem Boroughs, MD  END OF SESSION:  OT End of Session - 09/05/22 0933     Visit Number 7    Number of Visits 17    Date for OT Re-Evaluation 10/08/22    Authorization Type MCR, [employer] - covered 100%    Authorization - Number of Visits 7    Progress Note Due on Visit 10    OT Start Time 0932    OT Stop Time 8938    OT Time Calculation (min) 43 min    Activity Tolerance Patient tolerated treatment well    Behavior During Therapy [facility] for tasks assessed/performed                  Past Medical History:  Diagnosis Date   Adrenal cortical adenoma of left adrenal gland    stable since 2009;  MRI in 04/2010 - no further scans   CAD (coronary artery disease)    a. s/p NSTEMI in 2001 tx with BMS to RCA   Colon polyps    HLD (hyperlipidemia)    Hx of cardiovascular stress test    Lexiscan Myoview (09/2013):  No ischemia, EF 77%, Low Risk.   Hyperglycemia    Hypertension    Myoclonus    on neurontin per neuro at [facility]   Nephrolithiasis    Nodule of right lung    a. 73m on CXR 10/2009;  CT 05/2010 ok - no further scans   Tobacco abuse    Past Surgical History:  Procedure Laterality Date   CHOLECYSTECTOMY     CORONARY ANGIOPLASTY WITH STENT PLACEMENT     TOTAL ABDOMINAL HYSTERECTOMY     Patient Active Problem List   Diagnosis Date Noted   CAD (coronary artery disease) 09/04/2013   HTN (hypertension) 09/04/2013   HLD (hyperlipidemia) 09/04/2013   Tobacco abuse 09/04/2013    ONSET DATE: 08/05/2022  REFERRING DIAG: G37.3 (ICD-10-CM) - Acute transverse myelitis in demyelinating disease of central nervous system  (History of cervical transverse myelitis resulting in right sided weakness; more weakness on right side now which is likely due to debility and aging and right shoulder and  wrist pain after falls (has been evaluated in ER)  Needs strengthening, ROM, endurance, appropriate use of cane)  THERAPY DIAG:  Other symptoms and signs involving the nervous system  Other lack of coordination  Muscle weakness (generalized)  Rationale for Evaluation and Treatment: Rehabilitation  SUBJECTIVE:   SUBJECTIVE STATEMENT: No recent falls  Pt accompanied by: significant other (husband)   PERTINENT HISTORY: Ms. MRailis an 89year old right-handed female with a history of hypertension, hyperlipidemia, coronary artery disease status post stent and cervical transverse myelitis presenting to clinic with chief concern of history of transverse myelitis and multiple falls.  PRECAUTIONS: Fall and Other: stent   HARD OF HEARING  WEIGHT BEARING RESTRICTIONS: No  PAIN:  PAIN:  Are you having pain? Yes: NPRS scale: 6/10 Pain location: R scapula and neck Pain description: sore Aggravating factors: exercise Relieving factors: rest , tylenol    FALLS: Has patient fallen in last 6 months? Yes. Number of falls 2  LIVING ENVIRONMENT: Lives with: lives with their spouse Pt lives in 2 story house, but lives on first floor with 6 steps to enter from garage, railing on Rt going up Has following equipment at home: Single point cane,  Walker - 2 wheeled, Grab bars, and rollator, built in shower seat, grab bars in shower and toilet area  PLOF:  occasional min assist for BADLS in the last year  PATIENT GOALS: increase RUE/hand use  OBJECTIVE:   HAND DOMINANCE: Right   UPPER EXTREMITY ROM:    LUE AROM WNL's  RUE: shoulder flex approx 50*, abduction 45*, ER approx 50%, IR 75%; elbow, forearm and wrist WFL's. Rt hand movements slightly weaker but can do all intrinsic movement with difficulty  HAND FUNCTION: Grip strength: Right: 15.2 lbs; Left: 39.9 lbs  COORDINATION: 9 Hole Peg test: Right: 43.45 sec; Left: 47.67 sec    TODAY'S TREATMENT:                                                                                                                                Wt bearing over RUE seated while working on lateral trunk flexion and head flexion to activate/shorten Rt side and elongate Lt side of trunk, followed by body on arm movements with trunk rotation.   AA/ROM BUE's along gravity elim plane and slightly inclined plane in low flex. Progressed to unilateral AA/ROM RUE in flex and abduction.   Low level reaching RUE to remove clothespins, place on antenna, move to low range abduction, and then return to original position   PATIENT EDUCATION: Education details:  continued to cue for posture for functional tasks  Person educated: Patient and Spouse Education method: Explanation, Demonstration, Tactile cues, and Verbal cues Education comprehension: verbalized understanding, returned demonstration, and verbal cues required  [place]: 08/18/22: Yellow putty HEP and coordination HEP 08/22/22:  [name] HEP   GOALS: Goals reviewed with patient? Yes  SHORT TERM GOALS: Target date: 09/09/22  Independent with HEP for Rt shoulder ROM, Rt grip strength, and bilateral coordination Goal status: INITIAL  2.  Pt to verbalize understanding with A/E to increase ease with ADLS (LH brush, button hook, etc)  Goal status: INITIAL  3.  Pt to eat 50% with Rt dominant hand and brush teeth 25% with Rt hand Goal status: INITIAL  4.  Pt to consistently doff pull over shirt I'ly Goal status: INITIAL   LONG TERM GOALS: Target date: 10/08/22  Independent with updated HEP PRN Goal status: INITIAL  2.  Pt to eat 75% with Rt hand, and perform grooming 50% with Rt hand using A/E prn for brushing hair Baseline:  Goal status: INITIAL  3.  Pt to improve bilateral coordination by 5 sec on 9 hole peg test  Baseline: Rt = 43.45 sec, Lt = 47.67 sec Goal status: INITIAL  4.  Pt to improve Rt hand grip strength by 5 lbs  Baseline: 15.2 lbs Goal status: INITIAL  5.  Pt  to improve RUE sh flexion to 70* for greater ease with dressing, grooming and low to mid level reaching Baseline:  Goal status: INITIAL   ASSESSMENT:  CLINICAL IMPRESSION: Pt is progressing towards goals  with improving RUE strength/endurance.  Pt fatigued at end of session    PERFORMANCE DEFICITS: in functional skills including ADLs, IADLs, coordination, dexterity, edema, ROM, strength, pain, flexibility, Fine motor control, Gross motor control, mobility, body mechanics, endurance, decreased knowledge of use of DME, and UE functional use.   IMPAIRMENTS: are limiting patient from ADLs, IADLs, and leisure.   CO-MORBIDITIES: may have co-morbidities  that affects occupational performance. Patient will benefit from skilled OT to address above impairments and improve overall function.  MODIFICATION OR ASSISTANCE TO COMPLETE EVALUATION: No modification of tasks or assist necessary to complete an evaluation.  OT OCCUPATIONAL PROFILE AND HISTORY: Problem focused assessment: Including review of records relating to presenting problem.  CLINICAL DECISION MAKING: Moderate - several treatment options, min-mod task modification necessary  REHAB POTENTIAL: Fair time since onset and gradual decline over last few years  EVALUATION COMPLEXITY: Low   PLAN:  OT FREQUENCY: 2x/week  OT DURATION: 8 weeks, plus evaluation  PLANNED INTERVENTIONS: self care/ADL training, therapeutic exercise, therapeutic activity, neuromuscular re-education, manual therapy, passive range of motion, functional mobility training, splinting, paraffin, fluidotherapy, moist heat, patient/family education, coping strategies training, and DME and/or AE instructions  RECOMMENDED OTHER SERVICES: none at this time  CONSULTED AND AGREED WITH PLAN OF CARE: Patient and family member/caregiver  PLAN FOR NEXT SESSION:  continue to work on lateral trunk/head flexion, consider trunk control on compliant surface, check progress towards  STG's   Hans Eden, OTR/L 09/05/2022, 9:34 AM

## 2022-09-05 NOTE — Therapy (Signed)
OUTPATIENT PHYSICAL THERAPY NEURO TREATMENT   Patient Name: Kim Sutton MRN: [id number] DOB:Nov 19, 1933, 89 y.o., female Today's Date: 09/05/2022   PCP: Lajean Manes, MD (retired) REFERRING PROVIDER: Kem Boroughs, MD  END OF SESSION:  PT End of Session - 09/05/22 1015     Visit Number 7    Number of Visits 17    Date for PT Re-Evaluation 11/01/22    Authorization Type Medicare    Progress Note Due on Visit 10    PT Start Time 0258    PT Stop Time 1057    PT Time Calculation (min) 42 min    Equipment Utilized During Treatment Gait belt    Activity Tolerance Patient tolerated treatment well;Other (comment);Patient limited by fatigue   hard of hearing, repetition needed   Behavior During Therapy [facility] for tasks assessed/performed               Past Medical History:  Diagnosis Date   Adrenal cortical adenoma of left adrenal gland    stable since 2009;  MRI in 04/2010 - no further scans   CAD (coronary artery disease)    a. s/p NSTEMI in 2001 tx with BMS to RCA   Colon polyps    HLD (hyperlipidemia)    Hx of cardiovascular stress test    Lexiscan Myoview (09/2013):  No ischemia, EF 77%, Low Risk.   Hyperglycemia    Hypertension    Myoclonus    on neurontin per neuro at [facility]   Nephrolithiasis    Nodule of right lung    a. 21m on CXR 10/2009;  CT 05/2010 ok - no further scans   Tobacco abuse    Past Surgical History:  Procedure Laterality Date   CHOLECYSTECTOMY     CORONARY ANGIOPLASTY WITH STENT PLACEMENT     TOTAL ABDOMINAL HYSTERECTOMY     Patient Active Problem List   Diagnosis Date Noted   CAD (coronary artery disease) 09/04/2013   HTN (hypertension) 09/04/2013   HLD (hyperlipidemia) 09/04/2013   Tobacco abuse 09/04/2013    ONSET DATE: 08/05/2022  REFERRING DIAG: G37.3 (ICD-10-CM) - Acute transverse myelitis in demyelinating disease of central nervous system  THERAPY DIAG:  Muscle weakness (generalized)  Abnormal posture  Unsteadiness on  feet  Other abnormalities of gait and mobility  Rationale for Evaluation and Treatment: Rehabilitation  SUBJECTIVE:                                                                                                                                                                                             SUBJECTIVE STATEMENT: Pt reports no changes since last visit,  no falls. Pt reports she is working on her HEP, it causes muscle soreness which she treats with Tylenol.  Pt accompanied by: self and significant other husband Kim Sutton  PERTINENT HISTORY: CAD, hyperlipidemia, hypertension, transverse myelitis  PAIN:  Are you having pain? Yes: NPRS scale: 6/10 Pain location: right rib cage and shoulder blade Pain description: pressure Aggravating factors: moving, lifting Relieving factors: tylenol  PRECAUTIONS: Fall -Pt hears best out of LEFT ear (hard of hearing)  PATIENT GOALS: "be able to get up and walk around and do everything I used to do"  TODAY'S TREATMENT:                                                                                                                              THER ACT: Reassessed for STG assessment:  [facility] PT Assessment - 09/05/22 1018       Ambulation/Gait   Gait velocity 32.8 ft over 20 sec = 1.64 ft/sec      Standardized Balance Assessment   Standardized Balance Assessment Timed Up and Go Test;Five Times Sit to Stand    Five times sit to stand comments  24.97 sec            CERVICAL ROM:    Active ROM AROM (deg) eval AROM (deg) 09/05/22  Flexion 35   Extension 20   Right lateral flexion 5 15  Left lateral flexion 50   Right rotation 40 55  Left rotation 70    (Blank rows = not tested)   At countertop to work on increasing RLE step length and clearance with one UE support: RLE foam block step-overs 2 x 10 reps Agility ladder step-through 6 x 10 ft   THER EX: At countertop with BUE support and chair placed behind patient: Mini squats 2 x  10 reps Added to HEP, see bolded below  GAIT: Gait pattern: decreased hip/knee flexion- Right and decreased ankle dorsiflexion- Right Distance walked: 200 ft, 115 ft Assistive device utilized: Single point cane Level of assistance: Modified independence Comments: pt scoots her R foot along the floor; assessed gait following practice of increasing R step length and LE clearance during gait with fair carryover  PATIENT EDUCATION: Education details: Continue HEP, added to HEP Person educated: Patient and Spouse Education method: Explanation, Media planner, and Handouts Education comprehension: verbalized understanding, returned demonstration, and needs further education  HOME EXERCISE PROGRAM: Access Code: [email or web address] URL: [email or web address] Date: 08/18/2022 Prepared by: Elease Etienne  Exercises - Seated Hamstring Stretch  - 1 x daily - 4-5 x weekly - 1 sets - 2 reps - 45 seconds hold - Seated Single Knee to Chest  - 1 x daily - 4-5 x weekly - 1 sets - 2 reps - 20-30 seconds hold - Gastroc Stretch on Wall  - 1 x daily - 5 x weekly - 1 sets - 1-2 reps - 45 seconds hold - Standing Hip Extension with Counter Support  - 1 x daily -  5 x weekly - 2 sets - 10 reps - Seated Upper Trapezius Stretch  - 1 x daily - 7 x weekly - 1 sets - 5 reps - 30 sec hold - Seated Cervical Rotation AROM  - 1 x daily - 7 x weekly - 1 sets - 5 reps - 30 sec hold - Sit to Stand with Armchair  - 1 x daily - 7 x weekly - 3 sets - 10 reps - Mini Squat with Counter Support  - 1 x daily - 7 x weekly - 3 sets - 10 reps  Walking Program: Walk with your family.  Starting this week, walk 5 min per day for 7 days. Following week add 5 minutes to your total time. Week 1: 5 min Week 2: 10 min Week 3: 15 min Week 4: 20 min...Marland KitchenMarland KitchenUntil you can walk to 30 min per day    GOALS: Goals reviewed with patient? Yes  SHORT TERM GOALS: Target date: 09/06/2022   Pt will be independent with initial HEP for  improved strength, balance, transfers and gait. Baseline: Goal status: MET  2.  Pt will improve 5 x STS to less than or equal to 20 seconds to demonstrate improved functional strength and transfer efficiency.  Baseline: 25.43 sec (1/9), 24.97 sec (2/5) Goal status: IN PROGRESS  3.  Pt will improve gait velocity to at least 2.5 ft/sec for improved gait efficiency and performance at Supervision level  Baseline: 2.2 ft/sec with SPC and CGA (1/9), 1.64 ft/sec with SPC at mod I level (2/5) Goal status: IN PROGRESS  4.  Pt will improve her cervical ROM of R rotation and R lateral flexion >/=10 degrees to demonstrate improved function Baseline: 40 degrees R rotation, 5 degrees R lateral flexion (1/9), 55 degrees R rotation, 15 degrees R lateral flexion (2/5) Goal status: MET  5.  Pt will be able to ambulate x 150 ft with LRAD at Supervision level Baseline: 35 ft with SPC and CGA (1/9), 200 ft with SPC at mod I level (2/5) Goal status: MET   LONG TERM GOALS: Target date: 10/04/2022   Pt will be independent with final HEP for improved strength, balance, transfers and gait. Baseline:  Goal status: INITIAL  2.  Pt will improve 5 x STS to less than or equal to 20 seconds to demonstrate improved functional strength and transfer efficiency.  Baseline: 25.43 sec (1/9), 24.97 sec (2/5) Goal status: REVISED  3.  Pt will improve gait velocity to at least 2.5 ft/sec for improved gait efficiency and performance at mod I level  Baseline: 2.2 ft/sec with [facility] and CGA (1/9), 1.64 ft/sec with SPC at mod I level (2/5) Goal status: REVISED  4.  Pt will be able to ambulate x 500 ft with LRAD at mod I level Baseline: 35 ft with SPC and CGA (1/9), 200 ft with SPC at mod I level (2/5) Goal status: INITIAL  5.  Pt will improve normal TUG to less than or equal to 15 seconds for improved functional mobility and decreased fall risk. Baseline: 17.4 sec with SPC (1/9) Goal status:  INITIAL  ASSESSMENT:  CLINICAL IMPRESSION: Emphasis of skilled PT session on reassessing STG, adding to HEP, and working on increasing RLE step length and clearance during gait. Pt has met 3/5 STG due to being independent with her initial HEP, increasing her R cervical rotation and lateral flexion by at least 10 degrees, and increasing her endurance by improving her gait distance from 35 ft before onset  of fatigue to 200 ft. Pt exhibits minimal change in functional BLE strength with a change in 5xSTS score from 25.43 sec to 24.97 sec and exhibits a decrease in gait speed from 2.2 ft/sec initially to 1.64 ft/sec this date. Pt continues to benefit from skilled therapy services due to ongoing LE weakness, decreased balance, and increased fall risk. Continue POC.   OBJECTIVE IMPAIRMENTS: Abnormal gait, decreased balance, decreased endurance, decreased mobility, difficulty walking, decreased ROM, decreased strength, impaired perceived functional ability, impaired flexibility, impaired UE functional use, improper body mechanics, postural dysfunction, and pain.   ACTIVITY LIMITATIONS: carrying, lifting, bending, squatting, stairs, transfers, and bed mobility  PARTICIPATION LIMITATIONS: driving and community activity  PERSONAL FACTORS: Age, Time since onset of injury/illness/exacerbation, and 1-2 comorbidities:    CAD, hyperlipidemia, hypertensionare also affecting patient's functional outcome.   REHAB POTENTIAL: Good  CLINICAL DECISION MAKING: Stable/uncomplicated  EVALUATION COMPLEXITY: Low  PLAN:  PT FREQUENCY: 2x/week  PT DURATION: 8 weeks  PLANNED INTERVENTIONS: Therapeutic exercises, Therapeutic activity, Neuromuscular re-education, Balance training, Gait training, Patient/Family education, Self Care, Joint mobilization, Stair training, Vestibular training, Canalith repositioning, Visual/preceptual remediation/compensation, Orthotic/Fit training, DME instructions, Aquatic Therapy, Dry  Needling, Electrical stimulation, Spinal mobilization, Cryotherapy, Moist heat, Taping, Manual therapy, and Re-evaluation  PLAN FOR NEXT SESSION: how is HEP? modify HEP prn for RLE ROM, strengthening (hip ROM, HS and gastroc stretches, sit to stands), endurance, SciFit vs NuStep, standing marches, resisted sidesteps, RLE hip and knee flexor strengthening, eccentric step downs, Blaze pods on various height surfaces, RLE step length and clearance   Excell Seltzer, PT, DPT, CSRS 09/05/2022, 10:57 AM

## 2022-09-08 ENCOUNTER — Ambulatory Visit: Payer: Medicare Other | Admitting: Occupational Therapy

## 2022-09-08 ENCOUNTER — Encounter: Payer: Self-pay | Admitting: Occupational Therapy

## 2022-09-08 ENCOUNTER — Ambulatory Visit: Payer: Medicare Other | Admitting: Physical Therapy

## 2022-09-08 DIAGNOSIS — R2681 Unsteadiness on feet: Secondary | ICD-10-CM | POA: Diagnosis not present

## 2022-09-08 DIAGNOSIS — R29818 Other symptoms and signs involving the nervous system: Secondary | ICD-10-CM | POA: Diagnosis not present

## 2022-09-08 DIAGNOSIS — R2689 Other abnormalities of gait and mobility: Secondary | ICD-10-CM

## 2022-09-08 DIAGNOSIS — R278 Other lack of coordination: Secondary | ICD-10-CM | POA: Diagnosis not present

## 2022-09-08 DIAGNOSIS — M6281 Muscle weakness (generalized): Secondary | ICD-10-CM | POA: Diagnosis not present

## 2022-09-08 DIAGNOSIS — R293 Abnormal posture: Secondary | ICD-10-CM | POA: Diagnosis not present

## 2022-09-08 NOTE — Therapy (Signed)
OUTPATIENT PHYSICAL THERAPY NEURO TREATMENT   Patient Name: Kim Sutton MRN: [id number] DOB:03-07-1934, 89 y.o., female Today's Date: 09/09/2022   PCP: Lajean Manes, MD (retired) REFERRING PROVIDER: Kem Boroughs, MD  END OF SESSION:  PT End of Session - 09/09/22 1017     Visit Number 8    Number of Visits 17    Date for PT Re-Evaluation 11/01/22    Authorization Type Medicare    Progress Note Due on Visit 10    PT Start Time 1017    PT Stop Time 1100    PT Time Calculation (min) 43 min    Equipment Utilized During Treatment --    Activity Tolerance Patient tolerated treatment well;Other (comment);Patient limited by fatigue   hard of hearing, repetition needed   Behavior During Therapy [facility] for tasks assessed/performed                Past Medical History:  Diagnosis Date   Adrenal cortical adenoma of left adrenal gland    stable since 2009;  MRI in 04/2010 - no further scans   CAD (coronary artery disease)    a. s/p NSTEMI in 2001 tx with BMS to RCA   Colon polyps    HLD (hyperlipidemia)    Hx of cardiovascular stress test    Lexiscan Myoview (09/2013):  No ischemia, EF 77%, Low Risk.   Hyperglycemia    Hypertension    Myoclonus    on neurontin per neuro at [facility]   Nephrolithiasis    Nodule of right lung    a. 35m on CXR 10/2009;  CT 05/2010 ok - no further scans   Tobacco abuse    Past Surgical History:  Procedure Laterality Date   CHOLECYSTECTOMY     CORONARY ANGIOPLASTY WITH STENT PLACEMENT     TOTAL ABDOMINAL HYSTERECTOMY     Patient Active Problem List   Diagnosis Date Noted   CAD (coronary artery disease) 09/04/2013   HTN (hypertension) 09/04/2013   HLD (hyperlipidemia) 09/04/2013   Tobacco abuse 09/04/2013    ONSET DATE: 08/05/2022  REFERRING DIAG: G37.3 (ICD-10-CM) - Acute transverse myelitis in demyelinating disease of central nervous system  THERAPY DIAG:  Other abnormalities of gait and mobility  Muscle weakness  (generalized)  Unsteadiness on feet  Rationale for Evaluation and Treatment: Rehabilitation  SUBJECTIVE:                                                                                                                                                                                             SUBJECTIVE STATEMENT: Pt reports no changes or problems - says she is  getting better since she started PT again.  Pt reports her granddaughter is getting married in September - wants to be able to walk better for the wedding  Pt accompanied by: self and significant other husband Kim Sutton  PERTINENT HISTORY: CAD, hyperlipidemia, hypertension, transverse myelitis  PAIN:  Are you having pain? Yes: NPRS scale: 6/10 Pain location: right rib cage and shoulder blade Pain description: pressure Aggravating factors: moving, lifting Relieving factors: tylenol  PRECAUTIONS: Fall -Pt hears best out of LEFT ear (hard of hearing)  PATIENT GOALS: "be able to get up and walk around and do everything I used to do"  TODAY'S TREATMENT:                                                                                                                               TherEx: 5x sit to stand from mat without UE support with SBA  Pt performed stepping over & back of lower height orange hurdle with 2# weight on RLE; min assist for balance with lifting Rt foot back to starting position due to decreased difficulty with flexing Rt hip sufficiently for clearance of hurdle;  pt performed 7 reps prior to fatigue with this exercise - modified exercise to stepping forward and back on floor without object  5 reps with CGA;  pt needed seated rest break and then performed stepping laterally with RLE 5 reps and then stepping backward/forward with RLE 5 reps with CGA  Heel raises 10 reps with min. UE support on // bars  Neuro Re-ed: Rockerboard anterior/posteriorly inside // bars with UE support on // bars 10 reps; 10 reps with RUE support  only and then 10 reps without any UE support with min assist for balance Pt performed standing statically on rockerboard - performed 10 horizontal head turns with CGA without UE support on bars Pt performed stepping down/back from rockerboard to floor 5 times with each leg for improved SLS and for strengthening - CGA for safety Marching in place 10 reps on floor without UE support with SBA Sidestepping 10' x 2 reps inside // bars without UE support with SBA Tap ups to 6" step placed inside // bars with min. RUE support on // bar  GAIT: Gait pattern: decreased hip/knee flexion- Right and decreased ankle dorsiflexion- Right Distance walked: 230' at start of session;  115' at end of session with [facility] but no heavy reliance on cane Assistive device utilized: Single point cane Level of assistance: Modified independence Comments: pt scoots her R foot along the floor; assessed gait following practice of increasing R step length and LE clearance during gait with fair carryover  PATIENT EDUCATION: Education details: Continue HEP, added to HEP Person educated: Patient and Spouse Education method: Explanation, Demonstration, and Handouts Education comprehension: verbalized understanding, returned demonstration, and needs further education  HOME EXERCISE PROGRAM: Access Code: [email or web address] URL: [email or web address] Date: 08/18/2022 Prepared by: Elease Etienne  Exercises - Seated Hamstring Stretch  - 1 x  daily - 4-5 x weekly - 1 sets - 2 reps - 45 seconds hold - Seated Single Knee to Chest  - 1 x daily - 4-5 x weekly - 1 sets - 2 reps - 20-30 seconds hold - Gastroc Stretch on Wall  - 1 x daily - 5 x weekly - 1 sets - 1-2 reps - 45 seconds hold - Standing Hip Extension with Counter Support  - 1 x daily - 5 x weekly - 2 sets - 10 reps - Seated Upper Trapezius Stretch  - 1 x daily - 7 x weekly - 1 sets - 5 reps - 30 sec hold - Seated Cervical Rotation AROM  - 1 x daily - 7 x weekly - 1  sets - 5 reps - 30 sec hold - Sit to Stand with Armchair  - 1 x daily - 7 x weekly - 3 sets - 10 reps - Mini Squat with Counter Support  - 1 x daily - 7 x weekly - 3 sets - 10 reps  Walking Program: Walk with your family.  Starting this week, walk 5 min per day for 7 days. Following week add 5 minutes to your total time. Week 1: 5 min Week 2: 10 min Week 3: 15 min Week 4: 20 min...Marland KitchenMarland KitchenUntil you can walk to 30 min per day    GOALS: Goals reviewed with patient? Yes  SHORT TERM GOALS: Target date: 09/06/2022   Pt will be independent with initial HEP for improved strength, balance, transfers and gait. Baseline: Goal status: MET  2.  Pt will improve 5 x STS to less than or equal to 20 seconds to demonstrate improved functional strength and transfer efficiency.  Baseline: 25.43 sec (1/9), 24.97 sec (2/5) Goal status: IN PROGRESS  3.  Pt will improve gait velocity to at least 2.5 ft/sec for improved gait efficiency and performance at Supervision level  Baseline: 2.2 ft/sec with SPC and CGA (1/9), 1.64 ft/sec with SPC at mod I level (2/5) Goal status: IN PROGRESS  4.  Pt will improve her cervical ROM of R rotation and R lateral flexion >/=10 degrees to demonstrate improved function Baseline: 40 degrees R rotation, 5 degrees R lateral flexion (1/9), 55 degrees R rotation, 15 degrees R lateral flexion (2/5) Goal status: MET  5.  Pt will be able to ambulate x 150 ft with LRAD at Supervision level Baseline: 35 ft with SPC and CGA (1/9), 200 ft with SPC at mod I level (2/5) Goal status: MET   LONG TERM GOALS: Target date: 10/04/2022   Pt will be independent with final HEP for improved strength, balance, transfers and gait. Baseline:  Goal status: INITIAL  2.  Pt will improve 5 x STS to less than or equal to 20 seconds to demonstrate improved functional strength and transfer efficiency.  Baseline: 25.43 sec (1/9), 24.97 sec (2/5) Goal status: REVISED  3.  Pt will improve gait velocity to  at least 2.5 ft/sec for improved gait efficiency and performance at mod I level  Baseline: 2.2 ft/sec with [facility] and CGA (1/9), 1.64 ft/sec with SPC at mod I level (2/5) Goal status: REVISED  4.  Pt will be able to ambulate x 500 ft with LRAD at mod I level Baseline: 35 ft with SPC and CGA (1/9), 200 ft with SPC at mod I level (2/5) Goal status: INITIAL  5.  Pt will improve normal TUG to less than or equal to 15 seconds for improved functional mobility and decreased fall  risk. Baseline: 17.4 sec with SPC (1/9) Goal status: INITIAL  ASSESSMENT:  CLINICAL IMPRESSION: PT session focused on RLE strengthening and SLS activities with UE support prn.  Pt has weakness in Rt hip flexors with pt fatiguing quickly with lifting RLE for stepping up onto step or lifting to step over object.  Pt performed 7 reps with 2# on RLE prior to fatigue.  Pt required short seated rest breaks during PT session but had OT prior to PT session today.  Continue POC.   OBJECTIVE IMPAIRMENTS: Abnormal gait, decreased balance, decreased endurance, decreased mobility, difficulty walking, decreased ROM, decreased strength, impaired perceived functional ability, impaired flexibility, impaired UE functional use, improper body mechanics, postural dysfunction, and pain.   ACTIVITY LIMITATIONS: carrying, lifting, bending, squatting, stairs, transfers, and bed mobility  PARTICIPATION LIMITATIONS: driving and community activity  PERSONAL FACTORS: Age, Time since onset of injury/illness/exacerbation, and 1-2 comorbidities:    CAD, hyperlipidemia, hypertensionare also affecting patient's functional outcome.   REHAB POTENTIAL: Good  CLINICAL DECISION MAKING: Stable/uncomplicated  EVALUATION COMPLEXITY: Low  PLAN:  PT FREQUENCY: 2x/week  PT DURATION: 8 weeks  PLANNED INTERVENTIONS: Therapeutic exercises, Therapeutic activity, Neuromuscular re-education, Balance training, Gait training, Patient/Family education, Self Care,  Joint mobilization, Stair training, Vestibular training, Canalith repositioning, Visual/preceptual remediation/compensation, Orthotic/Fit training, DME instructions, Aquatic Therapy, Dry Needling, Electrical stimulation, Spinal mobilization, Cryotherapy, Moist heat, Taping, Manual therapy, and Re-evaluation  PLAN FOR NEXT SESSION: how is HEP? modify HEP prn for RLE ROM, strengthening (hip ROM, HS and gastroc stretches, sit to stands), endurance, SciFit vs NuStep, standing marches, resisted sidesteps, RLE hip and knee flexor strengthening, eccentric step downs, Blaze pods on various height surfaces, RLE step length and clearance   Ismeal Heider, [place], PT 09/09/2022, 10:19 AM

## 2022-09-08 NOTE — Therapy (Signed)
OUTPATIENT OCCUPATIONAL THERAPY NEURO TREATMENT  Patient Name: Kim Sutton MRN: [id number] DOB:[birth year], 89 y.o., female Today's Date: 09/08/2022  PCP: Dr. Louis Matte REFERRING PROVIDER: Kem Boroughs, MD  END OF SESSION:  OT End of Session - 09/08/22 0933     Visit Number 8    Number of Visits 17    Date for OT Re-Evaluation 10/08/22    Authorization Type MCR, [employer] - covered 100%    Authorization - Number of Visits 8    Progress Note Due on Visit 10    OT Start Time 0930    OT Stop Time 7673    OT Time Calculation (min) 45 min    Activity Tolerance Patient tolerated treatment well    Behavior During Therapy [facility] for tasks assessed/performed                  Past Medical History:  Diagnosis Date   Adrenal cortical adenoma of left adrenal gland    stable since 2009;  MRI in 04/2010 - no further scans   CAD (coronary artery disease)    a. s/p NSTEMI in 2001 tx with BMS to RCA   Colon polyps    HLD (hyperlipidemia)    Hx of cardiovascular stress test    Lexiscan Myoview (09/2013):  No ischemia, EF 77%, Low Risk.   Hyperglycemia    Hypertension    Myoclonus    on neurontin per neuro at [facility]   Nephrolithiasis    Nodule of right lung    a. 66m on CXR 10/2009;  CT 05/2010 ok - no further scans   Tobacco abuse    Past Surgical History:  Procedure Laterality Date   CHOLECYSTECTOMY     CORONARY ANGIOPLASTY WITH STENT PLACEMENT     TOTAL ABDOMINAL HYSTERECTOMY     Patient Active Problem List   Diagnosis Date Noted   CAD (coronary artery disease) 09/04/2013   HTN (hypertension) 09/04/2013   HLD (hyperlipidemia) 09/04/2013   Tobacco abuse 09/04/2013    ONSET DATE: 08/05/2022  REFERRING DIAG: G37.3 (ICD-10-CM) - Acute transverse myelitis in demyelinating disease of central nervous system  (History of cervical transverse myelitis resulting in right sided weakness; more weakness on right side now which is likely due to debility and aging and right shoulder and  wrist pain after falls (has been evaluated in ER)  Needs strengthening, ROM, endurance, appropriate use of cane)  THERAPY DIAG:  Other symptoms and signs involving the nervous system  Other lack of coordination  Muscle weakness (generalized)  Abnormal posture  Rationale for Evaluation and Treatment: Rehabilitation  SUBJECTIVE:   SUBJECTIVE STATEMENT: No recent falls  Pt accompanied by: significant other (husband)   PERTINENT HISTORY: Ms. MFahrneris an 89year old right-handed female with a history of hypertension, hyperlipidemia, coronary artery disease status post stent and cervical transverse myelitis presenting to clinic with chief concern of history of transverse myelitis and multiple falls.  PRECAUTIONS: Fall and Other: stent   HARD OF HEARING  WEIGHT BEARING RESTRICTIONS: No  PAIN:  PAIN:  Are you having pain? Yes: NPRS scale: 0-2/10 Pain location: R scapula and neck Pain description: sore Aggravating factors: exercise Relieving factors: rest , tylenol    FALLS: Has patient fallen in last 6 months? Yes. Number of falls 2  LIVING ENVIRONMENT: Lives with: lives with their spouse Pt lives in 2 story house, but lives on first floor with 6 steps to enter from garage, railing on Rt going up Has following equipment at home:  Single point cane, Walker - 2 wheeled, Grab bars, and rollator, built in shower seat, grab bars in shower and toilet area  PLOF:  occasional min assist for BADLS in the last year  PATIENT GOALS: increase RUE/hand use  OBJECTIVE:   HAND DOMINANCE: Right   UPPER EXTREMITY ROM:    LUE AROM WNL's  RUE: shoulder flex approx 50*, abduction 45*, ER approx 50%, IR 75%; elbow, forearm and wrist WFL's. Rt hand movements slightly weaker but can do all intrinsic movement with difficulty  HAND FUNCTION: Grip strength: Right: 15.2 lbs; Left: 39.9 lbs  COORDINATION: 9 Hole Peg test: Right: 43.45 sec; Left: 47.67 sec    TODAY'S TREATMENT:                                                                                                                                Assessed STG's - see below goal section.  Discussed potential A/E needs including: LH brush, adapted eating utensils (larger handles and angled), and button hook. Pt/husband provided handouts and told how to purchase if interested. Therapist demo use of button hook.  Pt reports she is now doffing shirt I'ly  Worked on trunk control while seating on compliant surface (green disc) including: A/P pelvic tilt, lateral trunk flexion, and circumduction of pelvis/trunk   PATIENT EDUCATION: Education details:  continued to cue for posture for functional tasks  Person educated: Patient and Spouse Education method: Explanation, Demonstration, Tactile cues, and Verbal cues Education comprehension: verbalized understanding, returned demonstration, and verbal cues required  HOME EXERCISE PROGRAM: 08/18/22: Yellow putty HEP and coordination HEP 08/22/22:  Cane HEP   GOALS: Goals reviewed with patient? Yes  SHORT TERM GOALS: Target date: 09/09/22  Independent with HEP for Rt shoulder ROM, Rt grip strength, and bilateral coordination Goal status: MET  2.  Pt to verbalize understanding with A/E to increase ease with ADLS (LH brush, button hook, etc)  Goal status: MET  3.  Pt to eat 50% with Rt dominant hand and brush teeth 25% with Rt hand Goal status: PARTIALLY MET (performing brushing w/ dentures, only 25% eating with Rt hand)   4.  Pt to consistently doff pull over shirt I'ly Goal status: MET   LONG TERM GOALS: Target date: 10/08/22  Independent with updated HEP PRN Goal status: INITIAL  2.  Pt to eat 75% with Rt hand, and perform grooming 50% with Rt hand using A/E prn for brushing hair Baseline:  Goal status: INITIAL  3.  Pt to improve bilateral coordination by 5 sec on 9 hole peg test  Baseline: Rt = 43.45 sec, Lt = 47.67 sec Goal status: INITIAL  4.  Pt to improve Rt  hand grip strength by 5 lbs  Baseline: 15.2 lbs Goal status: INITIAL  5.  Pt to improve RUE sh flexion to 70* for greater ease with dressing, grooming and low to mid level reaching Baseline:  Goal status: INITIAL   ASSESSMENT:  CLINICAL IMPRESSION: Pt has met all STG's. Pt also with improved trunk and head position today   PERFORMANCE DEFICITS: in functional skills including ADLs, IADLs, coordination, dexterity, edema, ROM, strength, pain, flexibility, Fine motor control, Gross motor control, mobility, body mechanics, endurance, decreased knowledge of use of DME, and UE functional use.   IMPAIRMENTS: are limiting patient from ADLs, IADLs, and leisure.   CO-MORBIDITIES: may have co-morbidities  that affects occupational performance. Patient will benefit from skilled OT to address above impairments and improve overall function.  MODIFICATION OR ASSISTANCE TO COMPLETE EVALUATION: No modification of tasks or assist necessary to complete an evaluation.  OT OCCUPATIONAL PROFILE AND HISTORY: Problem focused assessment: Including review of records relating to presenting problem.  CLINICAL DECISION MAKING: Moderate - several treatment options, min-mod task modification necessary  REHAB POTENTIAL: Fair time since onset and gradual decline over last few years  EVALUATION COMPLEXITY: Low   PLAN:  OT FREQUENCY: 2x/week  OT DURATION: 8 weeks, plus evaluation  PLANNED INTERVENTIONS: self care/ADL training, therapeutic exercise, therapeutic activity, neuromuscular re-education, manual therapy, passive range of motion, functional mobility training, splinting, paraffin, fluidotherapy, moist heat, patient/family education, coping strategies training, and DME and/or AE instructions  RECOMMENDED OTHER SERVICES: none at this time  CONSULTED AND AGREED WITH PLAN OF CARE: Patient and family member/caregiver  PLAN FOR NEXT SESSION:  continue to work on lateral trunk/head flexion, RUE ROM and  function   Hans Eden, OTR/L 09/08/2022, 9:34 AM

## 2022-09-09 ENCOUNTER — Encounter: Payer: Self-pay | Admitting: Physical Therapy

## 2022-09-12 ENCOUNTER — Ambulatory Visit: Payer: Medicare Other | Admitting: Physical Therapy

## 2022-09-12 ENCOUNTER — Encounter: Payer: Self-pay | Admitting: Occupational Therapy

## 2022-09-12 ENCOUNTER — Ambulatory Visit: Payer: Medicare Other | Admitting: Occupational Therapy

## 2022-09-12 DIAGNOSIS — R2681 Unsteadiness on feet: Secondary | ICD-10-CM | POA: Diagnosis not present

## 2022-09-12 DIAGNOSIS — M6281 Muscle weakness (generalized): Secondary | ICD-10-CM | POA: Diagnosis not present

## 2022-09-12 DIAGNOSIS — R293 Abnormal posture: Secondary | ICD-10-CM

## 2022-09-12 DIAGNOSIS — R29818 Other symptoms and signs involving the nervous system: Secondary | ICD-10-CM

## 2022-09-12 DIAGNOSIS — R278 Other lack of coordination: Secondary | ICD-10-CM

## 2022-09-12 DIAGNOSIS — R2689 Other abnormalities of gait and mobility: Secondary | ICD-10-CM

## 2022-09-12 NOTE — Therapy (Signed)
OUTPATIENT OCCUPATIONAL THERAPY NEURO TREATMENT  Patient Name: Kim Sutton MRN: [id number] DOB:10/14/1933, 89 y.o., female Today's Date: 09/12/2022  PCP: Dr. Louis Matte REFERRING PROVIDER: Kem Boroughs, MD  END OF SESSION:  OT End of Session - 09/12/22 0932     Visit Number 9    Number of Visits 17    Date for OT Re-Evaluation 10/08/22    Authorization Type MCR, [employer] - covered 100%    Authorization - Number of Visits 9    Progress Note Due on Visit 10    OT Start Time 0930    OT Stop Time [id number]    OT Time Calculation (min) 45 min    Activity Tolerance Patient tolerated treatment well    Behavior During Therapy [facility] for tasks assessed/performed                  Past Medical History:  Diagnosis Date   Adrenal cortical adenoma of left adrenal gland    stable since 2009;  MRI in 04/2010 - no further scans   CAD (coronary artery disease)    a. s/p NSTEMI in 2001 tx with BMS to RCA   Colon polyps    HLD (hyperlipidemia)    Hx of cardiovascular stress test    Lexiscan Myoview (09/2013):  No ischemia, EF 77%, Low Risk.   Hyperglycemia    Hypertension    Myoclonus    on neurontin per neuro at [facility]   Nephrolithiasis    Nodule of right lung    a. 70m on CXR 10/2009;  CT 05/2010 ok - no further scans   Tobacco abuse    Past Surgical History:  Procedure Laterality Date   CHOLECYSTECTOMY     CORONARY ANGIOPLASTY WITH STENT PLACEMENT     TOTAL ABDOMINAL HYSTERECTOMY     Patient Active Problem List   Diagnosis Date Noted   CAD (coronary artery disease) 09/04/2013   HTN (hypertension) 09/04/2013   HLD (hyperlipidemia) 09/04/2013   Tobacco abuse 09/04/2013    ONSET DATE: 08/05/2022  REFERRING DIAG: G37.3 (ICD-10-CM) - Acute transverse myelitis in demyelinating disease of central nervous system  (History of cervical transverse myelitis resulting in right sided weakness; more weakness on right side now which is likely due to debility and aging and right shoulder and  wrist pain after falls (has been evaluated in ER)  Needs strengthening, ROM, endurance, appropriate use of cane)  THERAPY DIAG:  Muscle weakness (generalized)  Unsteadiness on feet  Abnormal posture  Other lack of coordination  Other symptoms and signs involving the nervous system  Rationale for Evaluation and Treatment: Rehabilitation  SUBJECTIVE:   SUBJECTIVE STATEMENT: No recent falls  Pt accompanied by: significant other (husband)   PERTINENT HISTORY: Kim Sutton an 89year old right-handed female with a history of hypertension, hyperlipidemia, coronary artery disease status post stent and cervical transverse myelitis presenting to clinic with chief concern of history of transverse myelitis and multiple falls.  PRECAUTIONS: Fall and Other: stent   HARD OF HEARING  WEIGHT BEARING RESTRICTIONS: No  PAIN:  PAIN:  Are you having pain? Yes: NPRS scale: 0-2/10 Pain location: Lt neck, 4/10 Rt shoulder/upper traps w/ sh flexion Pain description: sore Aggravating factors: exercise Relieving factors: rest , tylenol    FALLS: Has patient fallen in last 6 months? Yes. Number of falls 2  LIVING ENVIRONMENT: Lives with: lives with their spouse Pt lives in 2 story house, but lives on first floor with 6 steps to enter from garage, railing  on Rt going up Has following equipment at home: Single point cane, Walker - 2 wheeled, Grab bars, and rollator, built in shower seat, grab bars in shower and toilet area  PLOF:  occasional min assist for BADLS in the last year  PATIENT GOALS: increase RUE/hand use  OBJECTIVE:   HAND DOMINANCE: Right   UPPER EXTREMITY ROM:    LUE AROM WNL's  RUE: shoulder flex approx 50*, abduction 45*, ER approx 50%, IR 75%; elbow, forearm and wrist WFL's. Rt hand movements slightly weaker but can do all intrinsic movement with difficulty  HAND FUNCTION: Grip strength: Right: 15.2 lbs; Left: 39.9 lbs  COORDINATION: 9 Hole Peg test: Right: 43.45  sec; Left: 47.67 sec    TODAY'S TREATMENT:                                                                                                                               Supine w/ wedge to elevate head and upper trunk: BUE sh flexion and chest press holding boomwhacker - pain 4/10 Rt shoulder along upper traps. Min facilitation provided to RUE w/ sh flexion  Seated: flipping cards over Rt hand w/ physioball under Lt arm to keep trunk upright  Worked on placing cones to Rt side for trunk rotation to Rt w/ RUE, then back to table. Followed by wt bearing over RUE w/ reaching across body LUE then placing on high surface to Lt side for trunk elongation to Lt and lateral trunk flexion on Rt  Seated on physioball w/ gait belt and support from therapist to work on trunk control through A/P pelvic tilts, lateral trunk flexion, and LE lifts.   PATIENT EDUCATION: Education details:  continued to cue for posture for functional tasks  Person educated: Patient and Spouse Education method: Explanation, Demonstration, Tactile cues, and Verbal cues Education comprehension: verbalized understanding, returned demonstration, and verbal cues required  HOME EXERCISE PROGRAM: 08/18/22: Yellow putty HEP and coordination HEP 08/22/22:  Cane HEP   GOALS: Goals reviewed with patient? Yes  SHORT TERM GOALS: Target date: 09/09/22  Independent with HEP for Rt shoulder ROM, Rt grip strength, and bilateral coordination Goal status: MET  2.  Pt to verbalize understanding with A/E to increase ease with ADLS (LH brush, button hook, etc)  Goal status: MET  3.  Pt to eat 50% with Rt dominant hand and brush teeth 25% with Rt hand Goal status: PARTIALLY MET (performing brushing w/ dentures, only 25% eating with Rt hand)   4.  Pt to consistently doff pull over shirt I'ly Goal status: MET   LONG TERM GOALS: Target date: 10/08/22  Independent with updated HEP PRN Goal status: INITIAL  2.  Pt to eat 75% with Rt hand,  and perform grooming 50% with Rt hand using A/E prn for brushing hair Baseline:  Goal status: INITIAL  3.  Pt to improve bilateral coordination by 5 sec on 9 hole peg test  Baseline: Rt =  43.45 sec, Lt = 47.67 sec Goal status: INITIAL  4.  Pt to improve Rt hand grip strength by 5 lbs  Baseline: 15.2 lbs Goal status: INITIAL  5.  Pt to improve RUE sh flexion to 70* for greater ease with dressing, grooming and low to mid level reaching Baseline:  Goal status: INITIAL   ASSESSMENT:  CLINICAL IMPRESSION: Pt has met all STG's. Pt also with improved trunk and head position overall and increased use of RUE   PERFORMANCE DEFICITS: in functional skills including ADLs, IADLs, coordination, dexterity, edema, ROM, strength, pain, flexibility, Fine motor control, Gross motor control, mobility, body mechanics, endurance, decreased knowledge of use of DME, and UE functional use.   IMPAIRMENTS: are limiting patient from ADLs, IADLs, and leisure.   CO-MORBIDITIES: may have co-morbidities  that affects occupational performance. Patient will benefit from skilled OT to address above impairments and improve overall function.  MODIFICATION OR ASSISTANCE TO COMPLETE EVALUATION: No modification of tasks or assist necessary to complete an evaluation.  OT OCCUPATIONAL PROFILE AND HISTORY: Problem focused assessment: Including review of records relating to presenting problem.  CLINICAL DECISION MAKING: Moderate - several treatment options, min-mod task modification necessary  REHAB POTENTIAL: Fair time since onset and gradual decline over last few years  EVALUATION COMPLEXITY: Low   PLAN:  OT FREQUENCY: 2x/week  OT DURATION: 8 weeks, plus evaluation  PLANNED INTERVENTIONS: self care/ADL training, therapeutic exercise, therapeutic activity, neuromuscular re-education, manual therapy, passive range of motion, functional mobility training, splinting, paraffin, fluidotherapy, moist heat,  patient/family education, coping strategies training, and DME and/or AE instructions  RECOMMENDED OTHER SERVICES: none at this time  CONSULTED AND AGREED WITH PLAN OF CARE: Patient and family member/caregiver  PLAN FOR NEXT SESSION:  continue to work on lateral trunk/head flexion, RUE ROM and function, 10th progress note due   Hans Eden, OTR/L 09/12/2022, 9:33 AM

## 2022-09-12 NOTE — Therapy (Signed)
OUTPATIENT PHYSICAL THERAPY NEURO TREATMENT   Patient Name: Kim Sutton MRN: [id number] DOB:03/22/1934, 89 y.o., female Today's Date: 09/12/2022   PCP: Lajean Manes, MD (retired) REFERRING PROVIDER: Kem Boroughs, MD  END OF SESSION:  PT End of Session - 09/12/22 0847     Visit Number 9    Number of Visits 17    Date for PT Re-Evaluation 11/01/22    Authorization Type Medicare    Progress Note Due on Visit 10    PT Start Time 0846    PT Stop Time 0930    PT Time Calculation (min) 44 min    Equipment Utilized During Treatment Gait belt    Activity Tolerance Patient tolerated treatment well;Other (comment);Patient limited by fatigue   hard of hearing, repetition needed   Behavior During Therapy [facility] for tasks assessed/performed                 Past Medical History:  Diagnosis Date   Adrenal cortical adenoma of left adrenal gland    stable since 2009;  MRI in 04/2010 - no further scans   CAD (coronary artery disease)    a. s/p NSTEMI in 2001 tx with BMS to RCA   Colon polyps    HLD (hyperlipidemia)    Hx of cardiovascular stress test    Lexiscan Myoview (09/2013):  No ischemia, EF 77%, Low Risk.   Hyperglycemia    Hypertension    Myoclonus    on neurontin per neuro at [facility]   Nephrolithiasis    Nodule of right lung    a. 75m on CXR 10/2009;  CT 05/2010 ok - no further scans   Tobacco abuse    Past Surgical History:  Procedure Laterality Date   CHOLECYSTECTOMY     CORONARY ANGIOPLASTY WITH STENT PLACEMENT     TOTAL ABDOMINAL HYSTERECTOMY     Patient Active Problem List   Diagnosis Date Noted   CAD (coronary artery disease) 09/04/2013   HTN (hypertension) 09/04/2013   HLD (hyperlipidemia) 09/04/2013   Tobacco abuse 09/04/2013    ONSET DATE: 08/05/2022  REFERRING DIAG: G37.3 (ICD-10-CM) - Acute transverse myelitis in demyelinating disease of central nervous system  THERAPY DIAG:  Other abnormalities of gait and mobility  Muscle weakness  (generalized)  Unsteadiness on feet  Abnormal posture  Rationale for Evaluation and Treatment: Rehabilitation  SUBJECTIVE:                                                                                                                                                                                             SUBJECTIVE STATEMENT: Pt reports no falls or  acute changes since last visit. Pt reports that she feels like she is doing better overall. Pt reports that she does have a headache this morning that kept her up all night, has been working on turning her head and this causes a headache.  Pt accompanied by: self and significant other husband [name]  PERTINENT HISTORY: CAD, hyperlipidemia, hypertension, transverse myelitis  PAIN:  Are you having pain? Yes: NPRS scale: 6/10 Pain location: right rib cage and shoulder blade Pain description: pressure Aggravating factors: moving, lifting Relieving factors: tylenol  PRECAUTIONS: Fall -Pt hears best out of LEFT ear (hard of hearing)  PATIENT GOALS: "be able to get up and walk around and do everything I used to do"  TODAY'S TREATMENT:                                                                                                                                Neuro Re-ed: Standing at bottom of staircase with use of B handrails and CGA for support: RLE 6" step-taps 2 x 10 reps RLE weighted 6" step-taps x 10 reps with 4# ankle weight Pt with increased difficulty performing weighted task, exhibits improvement of task performance following removal of weight.  Ambulation x 115 ft with use of 4# ankle weight, pt exhibits increased difficulty clearing RLE with occasional catching of toes on R foot. Followed this with ambulation x 115 ft without added weight with pt exhibiting increased RLE step length and clearance.  Blaze Pods with min HHA for SLS stability and balance: Random 5 pod configuration in half-circle Pt scores: 7, 8, 10  reps  Blaze Pods with CGA and SPC for SLS stability and balance: Random 5 pod configuration in half-circle on various height surfaces Pt scores: 12, 12, 14 reps first round Pt scores: 11, 17, 14 reps second round   PATIENT EDUCATION: Education details: Continue HEP Person educated: Patient and Spouse Education method: Explanation, Demonstration, and Handouts Education comprehension: verbalized understanding, returned demonstration, and needs further education  HOME EXERCISE PROGRAM: Access Code: [email or web address] URL: [email or web address] Date: 08/18/2022 Prepared by: Elease Etienne  Exercises - Seated Hamstring Stretch  - 1 x daily - 4-5 x weekly - 1 sets - 2 reps - 45 seconds hold - Seated Single Knee to Chest  - 1 x daily - 4-5 x weekly - 1 sets - 2 reps - 20-30 seconds hold - Gastroc Stretch on Wall  - 1 x daily - 5 x weekly - 1 sets - 1-2 reps - 45 seconds hold - Standing Hip Extension with Counter Support  - 1 x daily - 5 x weekly - 2 sets - 10 reps - Seated Upper Trapezius Stretch  - 1 x daily - 7 x weekly - 1 sets - 5 reps - 30 sec hold - Seated Cervical Rotation AROM  - 1 x daily - 7 x weekly - 1 sets - 5 reps - 30 sec hold - Sit to Stand  with Armchair  - 1 x daily - 7 x weekly - 3 sets - 10 reps - Mini Squat with Counter Support  - 1 x daily - 7 x weekly - 3 sets - 10 reps  Walking Program: Walk with your family.  Starting this week, walk 5 min per day for 7 days. Following week add 5 minutes to your total time. Week 1: 5 min Week 2: 10 min Week 3: 15 min Week 4: 20 min...Marland KitchenMarland KitchenUntil you can walk to 30 min per day    GOALS: Goals reviewed with patient? Yes  SHORT TERM GOALS: Target date: 09/06/2022   Pt will be independent with initial HEP for improved strength, balance, transfers and gait. Baseline: Goal status: MET  2.  Pt will improve 5 x STS to less than or equal to 20 seconds to demonstrate improved functional strength and transfer efficiency.   Baseline: 25.43 sec (1/9), 24.97 sec (2/5) Goal status: IN PROGRESS  3.  Pt will improve gait velocity to at least 2.5 ft/sec for improved gait efficiency and performance at Supervision level  Baseline: 2.2 ft/sec with SPC and CGA (1/9), 1.64 ft/sec with SPC at mod I level (2/5) Goal status: IN PROGRESS  4.  Pt will improve her cervical ROM of R rotation and R lateral flexion >/=10 degrees to demonstrate improved function Baseline: 40 degrees R rotation, 5 degrees R lateral flexion (1/9), 55 degrees R rotation, 15 degrees R lateral flexion (2/5) Goal status: MET  5.  Pt will be able to ambulate x 150 ft with LRAD at Supervision level Baseline: 35 ft with SPC and CGA (1/9), 200 ft with SPC at mod I level (2/5) Goal status: MET   LONG TERM GOALS: Target date: 10/04/2022   Pt will be independent with final HEP for improved strength, balance, transfers and gait. Baseline:  Goal status: INITIAL  2.  Pt will improve 5 x STS to less than or equal to 20 seconds to demonstrate improved functional strength and transfer efficiency.  Baseline: 25.43 sec (1/9), 24.97 sec (2/5) Goal status: REVISED  3.  Pt will improve gait velocity to at least 2.5 ft/sec for improved gait efficiency and performance at mod I level  Baseline: 2.2 ft/sec with [facility] and CGA (1/9), 1.64 ft/sec with SPC at mod I level (2/5) Goal status: REVISED  4.  Pt will be able to ambulate x 500 ft with LRAD at mod I level Baseline: 35 ft with SPC and CGA (1/9), 200 ft with SPC at mod I level (2/5) Goal status: INITIAL  5.  Pt will improve normal TUG to less than or equal to 15 seconds for improved functional mobility and decreased fall risk. Baseline: 17.4 sec with SPC (1/9) Goal status: INITIAL  ASSESSMENT:  CLINICAL IMPRESSION: Emphasis of skilled PT session on working on SLS stability, standing balance, and increasing R hip flexor strength. Pt exhibits improved performance with RLE following error augmentation task. Pt  exhibits improved performance of standing balance and SLS stability on Blaze Pods with increased practice and as task progresses. Pt continues to benefit from skilled therapy services due to ongoing RLE weakness leading to balance impairments and increased fall risk. Continue POC.    OBJECTIVE IMPAIRMENTS: Abnormal gait, decreased balance, decreased endurance, decreased mobility, difficulty walking, decreased ROM, decreased strength, impaired perceived functional ability, impaired flexibility, impaired UE functional use, improper body mechanics, postural dysfunction, and pain.   ACTIVITY LIMITATIONS: carrying, lifting, bending, squatting, stairs, transfers, and bed mobility  PARTICIPATION LIMITATIONS:  driving and community activity  PERSONAL FACTORS: Age, Time since onset of injury/illness/exacerbation, and 1-2 comorbidities:    CAD, hyperlipidemia, hypertensionare also affecting patient's functional outcome.   REHAB POTENTIAL: Good  CLINICAL DECISION MAKING: Stable/uncomplicated  EVALUATION COMPLEXITY: Low  PLAN:  PT FREQUENCY: 2x/week  PT DURATION: 8 weeks  PLANNED INTERVENTIONS: Therapeutic exercises, Therapeutic activity, Neuromuscular re-education, Balance training, Gait training, Patient/Family education, Self Care, Joint mobilization, Stair training, Vestibular training, Canalith repositioning, Visual/preceptual remediation/compensation, Orthotic/Fit training, DME instructions, Aquatic Therapy, Dry Needling, Electrical stimulation, Spinal mobilization, Cryotherapy, Moist heat, Taping, Manual therapy, and Re-evaluation  PLAN FOR NEXT SESSION: how is HEP? modify HEP prn for RLE ROM, strengthening (hip ROM, HS and gastroc stretches, sit to stands), endurance, SciFit vs NuStep, standing marches, resisted sidesteps, RLE hip and knee flexor strengthening, eccentric step downs, Blaze pods on various height surfaces, RLE step length and clearance   Excell Seltzer, PT, DPT,  CSRS 09/12/2022, 9:32 AM

## 2022-09-15 ENCOUNTER — Ambulatory Visit: Payer: Medicare Other | Admitting: Physical Therapy

## 2022-09-15 ENCOUNTER — Ambulatory Visit: Payer: Medicare Other | Admitting: Occupational Therapy

## 2022-09-19 ENCOUNTER — Ambulatory Visit: Payer: Medicare Other | Admitting: Physical Therapy

## 2022-09-19 ENCOUNTER — Ambulatory Visit: Payer: Medicare Other | Admitting: Occupational Therapy

## 2022-09-19 ENCOUNTER — Encounter: Payer: Self-pay | Admitting: Occupational Therapy

## 2022-09-19 ENCOUNTER — Encounter: Payer: Self-pay | Admitting: Physical Therapy

## 2022-09-19 DIAGNOSIS — R2681 Unsteadiness on feet: Secondary | ICD-10-CM

## 2022-09-19 DIAGNOSIS — R293 Abnormal posture: Secondary | ICD-10-CM

## 2022-09-19 DIAGNOSIS — R278 Other lack of coordination: Secondary | ICD-10-CM

## 2022-09-19 DIAGNOSIS — R29818 Other symptoms and signs involving the nervous system: Secondary | ICD-10-CM | POA: Diagnosis not present

## 2022-09-19 DIAGNOSIS — M6281 Muscle weakness (generalized): Secondary | ICD-10-CM

## 2022-09-19 DIAGNOSIS — R2689 Other abnormalities of gait and mobility: Secondary | ICD-10-CM | POA: Diagnosis not present

## 2022-09-19 NOTE — Therapy (Signed)
OUTPATIENT OCCUPATIONAL THERAPY NEURO TREATMENT  Patient Name: Kim Sutton MRN: [id number] DOB:1934/07/07, 89 y.o., female Today's Date: 09/19/2022  PCP: Dr. Louis Sutton REFERRING PROVIDER: Kem Boroughs, MD  END OF SESSION:  OT End of Session - 09/19/22 0936     Visit Number 10    Number of Visits 17    Date for OT Re-Evaluation 10/08/22    Authorization Type MCR, [employer] - covered 100%    Authorization - Number of Visits 10    Progress Note Due on Visit 10    OT Start Time 0935    OT Stop Time [id number]    OT Time Calculation (min) 40 min    Activity Tolerance Patient tolerated treatment well    Behavior During Therapy [facility] for tasks assessed/performed                  Past Medical History:  Diagnosis Date   Adrenal cortical adenoma of left adrenal gland    stable since 2009;  MRI in 04/2010 - no further scans   CAD (coronary artery disease)    a. s/p NSTEMI in 2001 tx with BMS to RCA   Colon polyps    HLD (hyperlipidemia)    Hx of cardiovascular stress test    Lexiscan Myoview (09/2013):  No ischemia, EF 77%, Low Risk.   Hyperglycemia    Hypertension    Myoclonus    on neurontin per neuro at [facility]   Nephrolithiasis    Nodule of right lung    a. 44m on CXR 10/2009;  CT 05/2010 ok - no further scans   Tobacco abuse    Past Surgical History:  Procedure Laterality Date   CHOLECYSTECTOMY     CORONARY ANGIOPLASTY WITH STENT PLACEMENT     TOTAL ABDOMINAL HYSTERECTOMY     Patient Active Problem List   Diagnosis Date Noted   CAD (coronary artery disease) 09/04/2013   HTN (hypertension) 09/04/2013   HLD (hyperlipidemia) 09/04/2013   Tobacco abuse 09/04/2013    ONSET DATE: 08/05/2022  REFERRING DIAG: G37.3 (ICD-10-CM) - Acute transverse myelitis in demyelinating disease of central nervous system  (History of cervical transverse myelitis resulting in right sided weakness; more weakness on right side now which is likely due to debility and aging and right shoulder and  wrist pain after falls (has been evaluated in ER)  Needs strengthening, ROM, endurance, appropriate use of cane)  THERAPY DIAG:  Muscle weakness (generalized)  Abnormal posture  Other lack of coordination  Other symptoms and signs involving the nervous system  Rationale for Evaluation and Treatment: Rehabilitation  SUBJECTIVE:   SUBJECTIVE STATEMENT: No recent falls  Pt accompanied by: significant other (husband)   PERTINENT HISTORY: Ms. MMudrakis an 89year old right-handed female with a history of hypertension, hyperlipidemia, coronary artery disease status post stent and cervical transverse myelitis presenting to clinic with chief concern of history of transverse myelitis and multiple falls.  PRECAUTIONS: Fall and Other: stent   HARD OF HEARING  WEIGHT BEARING RESTRICTIONS: No  PAIN:  PAIN:  Are you having pain? Yes: NPRS scale: 0/10 today Pain location: Lt neck, 4/10 Rt shoulder/upper traps w/ sh flexion Pain description: sore Aggravating factors: exercise Relieving factors: rest, tylenol    FALLS: Has patient fallen in last 6 months? Yes. Number of falls 2  LIVING ENVIRONMENT: Lives with: lives with their spouse Pt lives in 2 story house, but lives on first floor with 6 steps to enter from garage, railing on Rt going up  Has following equipment at home: Single point cane, Walker - 2 wheeled, Grab bars, and rollator, built in shower seat, grab bars in shower and toilet area  PLOF:  occasional min assist for BADLS in the last year  PATIENT GOALS: increase RUE/hand use  OBJECTIVE:   HAND DOMINANCE: Right   UPPER EXTREMITY ROM:    LUE AROM WNL's  RUE: shoulder flex approx 50*, abduction 45*, ER approx 50%, IR 75%; elbow, forearm and wrist WFL's. Rt hand movements slightly weaker but can do all intrinsic movement with difficulty  HAND FUNCTION: Grip strength: Right: 15.2 lbs; Left: 39.9 lbs  COORDINATION: 9 Hole Peg test: Right: 43.45 sec; Left: 47.67  sec    TODAY'S TREATMENT:                                                                                                                               Worked on lateral trunk flexion while wt bearing over RUE and reaching to ipsilateral side LUE.  BUE AA/ROM in low range sh flex, elbow ext to slide dowel along transfer board.  Holding dowel vertically to perform sh flexion w/ LUE in top position to help correct trunk posture  Gripper set at level 1 resistance to pick up blocks Rt hand for sustained grip strength and low level functional reaching RUE w/ mod compensations Rt shoulder and trunk   Low level reaching RUE to place checkers into connect 4 slots RUE (placed on chair) w/ cues for elbow extension   PATIENT EDUCATION: Education details:  continued to cue for posture for functional tasks  Person educated: Patient and Spouse Education method: Explanation, Demonstration, Tactile cues, and Verbal cues Education comprehension: verbalized understanding, returned demonstration, and verbal cues required  HOME EXERCISE PROGRAM: 08/18/22: Yellow putty HEP and coordination HEP 08/22/22:  Kim Sutton HEP   GOALS: Goals reviewed with patient? Yes  SHORT TERM GOALS: Target date: 09/09/22  Independent with HEP for Rt shoulder ROM, Rt grip strength, and bilateral coordination Goal status: MET  2.  Pt to verbalize understanding with A/E to increase ease with ADLS (LH brush, button hook, etc)  Goal status: MET  3.  Pt to eat 50% with Rt dominant hand and brush teeth 25% with Rt hand Goal status: PARTIALLY MET (performing brushing w/ dentures, only 25% eating with Rt hand)   4.  Pt to consistently doff pull over shirt I'ly Goal status: MET   LONG TERM GOALS: Target date: 10/08/22  Independent with updated HEP PRN Goal status: INITIAL  2.  Pt to eat 75% with Rt hand, and perform grooming 50% with Rt hand using A/E prn for brushing hair Baseline:  Goal status: INITIAL  3.  Pt to improve  bilateral coordination by 5 sec on 9 hole peg test  Baseline: Rt = 43.45 sec, Lt = 47.67 sec Goal status: INITIAL  4.  Pt to improve Rt hand grip strength by 5 lbs  Baseline: 15.2 lbs  Goal status: INITIAL  5.  Pt to improve RUE sh flexion to 70* for greater ease with dressing, grooming and low to mid level reaching Baseline:  Goal status: INITIAL   ASSESSMENT:  CLINICAL IMPRESSION: This 10th progress note is for dates: 08/09/22 to 09/19/22: pt has met all STG's. Pt also with improved trunk and head position overall w/ less pain, and increased use of RUE in low ranges. Pt limited in higher ranges RUE   PERFORMANCE DEFICITS: in functional skills including ADLs, IADLs, coordination, dexterity, edema, ROM, strength, pain, flexibility, Fine motor control, Gross motor control, mobility, body mechanics, endurance, decreased knowledge of use of DME, and UE functional use.   IMPAIRMENTS: are limiting patient from ADLs, IADLs, and leisure.   CO-MORBIDITIES: may have co-morbidities  that affects occupational performance. Patient will benefit from skilled OT to address above impairments and improve overall function.  MODIFICATION OR ASSISTANCE TO COMPLETE EVALUATION: No modification of tasks or assist necessary to complete an evaluation.  OT OCCUPATIONAL PROFILE AND HISTORY: Problem focused assessment: Including review of records relating to presenting problem.  CLINICAL DECISION MAKING: Moderate - several treatment options, min-mod task modification necessary  REHAB POTENTIAL: Fair time since onset and gradual decline over last few years  EVALUATION COMPLEXITY: Low   PLAN:  OT FREQUENCY: 2x/week  OT DURATION: 8 weeks, plus evaluation  PLANNED INTERVENTIONS: self care/ADL training, therapeutic exercise, therapeutic activity, neuromuscular re-education, manual therapy, passive range of motion, functional mobility training, splinting, paraffin, fluidotherapy, moist heat, patient/family  education, coping strategies training, and DME and/or AE instructions  RECOMMENDED OTHER SERVICES: none at this time  CONSULTED AND AGREED WITH PLAN OF CARE: Patient and family member/caregiver  PLAN FOR NEXT SESSION:  continue to work on lateral trunk/head flexion, RUE ROM and function  Hans Eden, OTR/L 09/19/2022, 9:36 AM

## 2022-09-19 NOTE — Therapy (Unsigned)
OUTPATIENT PHYSICAL THERAPY NEURO TREATMENT   Patient Name: Kim Sutton MRN: [id number] DOB:1933-10-31, 89 y.o., female Today's Date: 09/19/2022   PCP: Lajean Manes, MD (retired) REFERRING PROVIDER: Kem Boroughs, MD  PT progress note for Kim Sutton.  Reporting period 08/09/2022 to 09/19/2022  See Note below for Objective Data and Assessment of Progress/Goals  Thank you for the referral of this patient. Elease Etienne, PT, DPT   END OF SESSION:  PT End of Session - 09/19/22 0853     Visit Number 10    Number of Visits 17    Date for PT Re-Evaluation 11/01/22    Authorization Type Medicare    Progress Note Due on Visit 10    PT Start Time [phone]    PT Stop Time 0930    PT Time Calculation (min) 41 min    Equipment Utilized During Treatment Gait belt    Activity Tolerance Patient tolerated treatment well;Other (comment);Patient limited by fatigue   hard of hearing, repetition needed   Behavior During Therapy [facility] for tasks assessed/performed                 Past Medical History:  Diagnosis Date   Adrenal cortical adenoma of left adrenal gland    stable since 2009;  MRI in 04/2010 - no further scans   CAD (coronary artery disease)    a. s/p NSTEMI in 2001 tx with BMS to RCA   Colon polyps    HLD (hyperlipidemia)    Hx of cardiovascular stress test    Lexiscan Myoview (09/2013):  No ischemia, EF 77%, Low Risk.   Hyperglycemia    Hypertension    Myoclonus    on neurontin per neuro at [facility]   Nephrolithiasis    Nodule of right lung    a. 92m on CXR 10/2009;  CT 05/2010 ok - no further scans   Tobacco abuse    Past Surgical History:  Procedure Laterality Date   CHOLECYSTECTOMY     CORONARY ANGIOPLASTY WITH STENT PLACEMENT     TOTAL ABDOMINAL HYSTERECTOMY     Patient Active Problem List   Diagnosis Date Noted   CAD (coronary artery disease) 09/04/2013   HTN (hypertension) 09/04/2013   HLD (hyperlipidemia) 09/04/2013   Tobacco abuse 09/04/2013     ONSET DATE: 08/05/2022  REFERRING DIAG: G37.3 (ICD-10-CM) - Acute transverse myelitis in demyelinating disease of central nervous system  THERAPY DIAG:  Muscle weakness (generalized)  Unsteadiness on feet  Abnormal posture  Other lack of coordination  Rationale for Evaluation and Treatment: Rehabilitation  SUBJECTIVE:  SUBJECTIVE STATEMENT: Pt denies falls or acute changes.  States her headache resolved and has not returned.  Pt accompanied by: self and significant other husband Kim Sutton  PERTINENT HISTORY: CAD, hyperlipidemia, hypertension, transverse myelitis  PAIN:  Are you having pain? Yes: NPRS scale: 6/10 Pain location: right rib cage and shoulder blade Pain description: pressure Aggravating factors: moving, lifting Relieving factors: tylenol  PRECAUTIONS: Fall -Pt hears best out of LEFT ear (hard of hearing)  PATIENT GOALS: "be able to get up and walk around and do everything I used to do"  TODAY'S TREATMENT:                                                                                                                                Neuro Re-ed: -Heel taps from airex progressed from BUE support to LUE support x8 alt LE each support -Heel taps from 4" step using BUE support, mild inc in forward trunk lean, cued for glute and quad engagement on return to step -22" elevated STS w/ 5 lb kettlebell x8> 21" elevated STS x8 -Standing on airex feet apart eyes open x48mn > airex feet apart eyes closed x158m, CGA progressed to intermittent SBA  TherEx: -Seated hamstring curls 2x10 w/ green theraband, 30 sec rest between sets -Standing terminal knee extension w/ green theraband 2x10, seated rest between sets  Discussion of cramps and medical management-deferred to discussion w/ MD, MD  has recently changed.  PATIENT EDUCATION: Education details: Continue HEP Person educated: Patient and Spouse Education method: Explanation, DeMedia plannerand Handouts Education comprehension: verbalized understanding, returned demonstration, and needs further education  HOME EXERCISE PROGRAM: Access Code: [email or web address]: [email or web address] Date: 08/18/2022 Prepared by: MaElease EtienneExercises - Seated Hamstring Stretch  - 1 x daily - 4-5 x weekly - 1 sets - 2 reps - 45 seconds hold - Seated Single Knee to Chest  - 1 x daily - 4-5 x weekly - 1 sets - 2 reps - 20-30 seconds hold - Gastroc Stretch on Wall  - 1 x daily - 5 x weekly - 1 sets - 1-2 reps - 45 seconds hold - Standing Hip Extension with Counter Support  - 1 x daily - 5 x weekly - 2 sets - 10 reps - Seated Upper Trapezius Stretch  - 1 x daily - 7 x weekly - 1 sets - 5 reps - 30 sec hold - Seated Cervical Rotation AROM  - 1 x daily - 7 x weekly - 1 sets - 5 reps - 30 sec hold - Sit to Stand with Armchair  - 1 x daily - 7 x weekly - 3 sets - 10 reps - Mini Squat with Counter Support  - 1 x daily - 7 x weekly - 3 sets - 10 reps - Seated Hamstring Curls with Resistance  - 1 x daily - 4 x weekly - 2 sets - 10 reps  Walking Program: Walk with your family.  Starting  this week, walk 5 min per day for 7 days. Following week add 5 minutes to your total time. Week 1: 5 min Week 2: 10 min Week 3: 15 min Week 4: 20 min...Marland KitchenMarland KitchenUntil you can walk to 30 min per day    GOALS: Goals reviewed with patient? Yes  SHORT TERM GOALS: Target date: 09/06/2022   Pt will be independent with initial HEP for improved strength, balance, transfers and gait. Baseline: Goal status: MET  2.  Pt will improve 5 x STS to less than or equal to 20 seconds to demonstrate improved functional strength and transfer efficiency.  Baseline: 25.43 sec (1/9), 24.97 sec (2/5) Goal status: IN PROGRESS  3.  Pt will improve gait velocity to at  least 2.5 ft/sec for improved gait efficiency and performance at Supervision level  Baseline: 2.2 ft/sec with SPC and CGA (1/9), 1.64 ft/sec with SPC at mod I level (2/5) Goal status: IN PROGRESS  4.  Pt will improve her cervical ROM of R rotation and R lateral flexion >/=10 degrees to demonstrate improved function Baseline: 40 degrees R rotation, 5 degrees R lateral flexion (1/9), 55 degrees R rotation, 15 degrees R lateral flexion (2/5) Goal status: MET  5.  Pt will be able to ambulate x 150 ft with LRAD at Supervision level Baseline: 35 ft with SPC and CGA (1/9), 200 ft with SPC at mod I level (2/5) Goal status: MET   LONG TERM GOALS: Target date: 10/04/2022   Pt will be independent with final HEP for improved strength, balance, transfers and gait. Baseline:  Goal status: INITIAL  2.  Pt will improve 5 x STS to less than or equal to 20 seconds to demonstrate improved functional strength and transfer efficiency.  Baseline: 25.43 sec (1/9), 24.97 sec (2/5) Goal status: REVISED  3.  Pt will improve gait velocity to at least 2.5 ft/sec for improved gait efficiency and performance at mod I level  Baseline: 2.2 ft/sec with [facility] and CGA (1/9), 1.64 ft/sec with SPC at mod I level (2/5) Goal status: REVISED  4.  Pt will be able to ambulate x 500 ft with LRAD at mod I level Baseline: 35 ft with SPC and CGA (1/9), 200 ft with SPC at mod I level (2/5) Goal status: INITIAL  5.  Pt will improve normal TUG to less than or equal to 15 seconds for improved functional mobility and decreased fall risk. Baseline: 17.4 sec with SPC (1/9) Goal status: INITIAL  ASSESSMENT:  CLINICAL IMPRESSION: Focus of skilled session on mechanical retraining of the LE particularly the quads and hamstrings.  Pt has ongoing postural dysfunction at rest and with activity that coupled with fatigue remains limiting for generalized activity tolerance.  Single addition made to HEP today to further address functional  hamstring strength.  She continues to benefit from skilled PT to address cardiovascular endurance, postural stability, and upright dynamic balance w/ LRAD.    OBJECTIVE IMPAIRMENTS: Abnormal gait, decreased balance, decreased endurance, decreased mobility, difficulty walking, decreased ROM, decreased strength, impaired perceived functional ability, impaired flexibility, impaired UE functional use, improper body mechanics, postural dysfunction, and pain.   ACTIVITY LIMITATIONS: carrying, lifting, bending, squatting, stairs, transfers, and bed mobility  PARTICIPATION LIMITATIONS: driving and community activity  PERSONAL FACTORS: Age, Time since onset of injury/illness/exacerbation, and 1-2 comorbidities:    CAD, hyperlipidemia, hypertensionare also affecting patient's functional outcome.   REHAB POTENTIAL: Good  CLINICAL DECISION MAKING: Stable/uncomplicated  EVALUATION COMPLEXITY: Low  PLAN:  PT FREQUENCY: 2x/week  PT  DURATION: 8 weeks  PLANNED INTERVENTIONS: Therapeutic exercises, Therapeutic activity, Neuromuscular re-education, Balance training, Gait training, Patient/Family education, Self Care, Joint mobilization, Stair training, Vestibular training, Canalith repositioning, Visual/preceptual remediation/compensation, Orthotic/Fit training, DME instructions, Aquatic Therapy, Dry Needling, Electrical stimulation, Spinal mobilization, Cryotherapy, Moist heat, Taping, Manual therapy, and Re-evaluation  PLAN FOR NEXT SESSION: how is HEP? modify HEP prn for RLE ROM, strengthening (hip ROM, HS and gastroc stretches, sit to stands), endurance, SciFit vs NuStep, standing marches, resisted sidesteps, RLE hip and knee flexor strengthening, eccentric step downs, RLE step length and clearance   Bary Richard, PT, DPT 09/19/2022, 9:33 AM

## 2022-09-19 NOTE — Patient Instructions (Signed)
Access Code: [email or web address] URL: [email or web address] Date: 09/19/2022 Prepared by: Elease Etienne  Exercises - Seated Hamstring Stretch  - 1 x daily - 4-5 x weekly - 1 sets - 2 reps - 45 seconds hold - Seated Single Knee to Chest  - 1 x daily - 4-5 x weekly - 1 sets - 2 reps - 20-30 seconds hold - Gastroc Stretch on Wall  - 1 x daily - 5 x weekly - 1 sets - 1-2 reps - 45 seconds hold - Standing Hip Extension with Counter Support  - 1 x daily - 5 x weekly - 2 sets - 10 reps - Seated Upper Trapezius Stretch  - 1 x daily - 7 x weekly - 1 sets - 5 reps - 30 sec hold - Seated Cervical Rotation AROM  - 1 x daily - 7 x weekly - 1 sets - 5 reps - 30 sec hold - Sit to Stand with Armchair  - 1 x daily - 7 x weekly - 3 sets - 10 reps - Mini Squat with Counter Support  - 1 x daily - 7 x weekly - 3 sets - 10 reps - Seated Hamstring Curls with Resistance  - 1 x daily - 4 x weekly - 2 sets - 10 reps

## 2022-09-20 DIAGNOSIS — R000 Tachycardia, unspecified: Secondary | ICD-10-CM | POA: Diagnosis not present

## 2022-09-22 ENCOUNTER — Ambulatory Visit: Payer: Medicare Other | Admitting: Occupational Therapy

## 2022-09-22 ENCOUNTER — Ambulatory Visit: Payer: Medicare Other | Admitting: Physical Therapy

## 2022-09-22 DIAGNOSIS — R293 Abnormal posture: Secondary | ICD-10-CM | POA: Diagnosis not present

## 2022-09-22 DIAGNOSIS — R2689 Other abnormalities of gait and mobility: Secondary | ICD-10-CM

## 2022-09-22 DIAGNOSIS — R278 Other lack of coordination: Secondary | ICD-10-CM | POA: Diagnosis not present

## 2022-09-22 DIAGNOSIS — R2681 Unsteadiness on feet: Secondary | ICD-10-CM

## 2022-09-22 DIAGNOSIS — R29818 Other symptoms and signs involving the nervous system: Secondary | ICD-10-CM

## 2022-09-22 DIAGNOSIS — M6281 Muscle weakness (generalized): Secondary | ICD-10-CM

## 2022-09-22 NOTE — Therapy (Signed)
OUTPATIENT OCCUPATIONAL THERAPY NEURO TREATMENT  Patient Name: Kim Sutton MRN: :[id number] DOB:1934-05-30, 89 y.o., female Today's Date: 09/22/2022  PCP: Dr. Louis Matte REFERRING PROVIDER: Kem Boroughs, MD  END OF SESSION:  OT End of Session - 09/22/22 0935     Visit Number 11    Number of Visits 17    Date for OT Re-Evaluation 10/08/22    Authorization Type MCR, [employer] - covered 100%    Authorization - Number of Visits 11    Progress Note Due on Visit 33    OT Start Time 0933    OT Stop Time [id number]    OT Time Calculation (min) 42 min    Activity Tolerance Patient tolerated treatment well    Behavior During Therapy [facility] for tasks assessed/performed                  Past Medical History:  Diagnosis Date   Adrenal cortical adenoma of left adrenal gland    stable since 2009;  MRI in 04/2010 - no further scans   CAD (coronary artery disease)    a. s/p NSTEMI in 2001 tx with BMS to RCA   Colon polyps    HLD (hyperlipidemia)    Hx of cardiovascular stress test    Lexiscan Myoview (09/2013):  No ischemia, EF 77%, Low Risk.   Hyperglycemia    Hypertension    Myoclonus    on neurontin per neuro at [facility]   Nephrolithiasis    Nodule of right lung    a. 32m on CXR 10/2009;  CT 05/2010 ok - no further scans   Tobacco abuse    Past Surgical History:  Procedure Laterality Date   CHOLECYSTECTOMY     CORONARY ANGIOPLASTY WITH STENT PLACEMENT     TOTAL ABDOMINAL HYSTERECTOMY     Patient Active Problem List   Diagnosis Date Noted   CAD (coronary artery disease) 09/04/2013   HTN (hypertension) 09/04/2013   HLD (hyperlipidemia) 09/04/2013   Tobacco abuse 09/04/2013    ONSET DATE: 08/05/2022  REFERRING DIAG: G37.3 (ICD-10-CM) - Acute transverse myelitis in demyelinating disease of central nervous system  (History of cervical transverse myelitis resulting in right sided weakness; more weakness on right side now which is likely due to debility and aging and right shoulder and  wrist pain after falls (has been evaluated in ER)  Needs strengthening, ROM, endurance, appropriate use of cane)  THERAPY DIAG:  Abnormal posture  Other lack of coordination  Muscle weakness (generalized)  Other symptoms and signs involving the nervous system  Unsteadiness on feet  Rationale for Evaluation and Treatment: Rehabilitation  SUBJECTIVE:   SUBJECTIVE STATEMENT: No recent falls  Pt accompanied by: significant other (husband)   PERTINENT HISTORY: Kim Sutton an 89year old right-handed female with a history of hypertension, hyperlipidemia, coronary artery disease status post stent and cervical transverse myelitis presenting to clinic with chief concern of history of transverse myelitis and multiple falls.  PRECAUTIONS: Fall and Other: stent   HARD OF HEARING  WEIGHT BEARING RESTRICTIONS: No  PAIN:  PAIN:  Are you having pain? Yes: NPRS scale: 0/10 today Pain location: Lt neck, 4/10 Rt shoulder/upper traps w/ sh flexion Pain description: sore Aggravating factors: exercise Relieving factors: rest, tylenol    FALLS: Has patient fallen in last 6 months? Yes. Number of falls 2  LIVING ENVIRONMENT: Lives with: lives with their spouse Pt lives in 2 story house, but lives on first floor with 6 steps to enter from garage, railing  on Rt going up Has following equipment at home: Single point cane, Walker - 2 wheeled, Grab bars, and rollator, built in shower seat, grab bars in shower and toilet area  PLOF:  occasional min assist for BADLS in the last year  PATIENT GOALS: increase RUE/hand use  OBJECTIVE:   HAND DOMINANCE: Right   UPPER EXTREMITY ROM:    LUE AROM WNL's  RUE: shoulder flex approx 50*, abduction 45*, ER approx 50%, IR 75%; elbow, forearm and wrist WFL's. Rt hand movements slightly weaker but can do all intrinsic movement with difficulty  HAND FUNCTION: Grip strength: Right: 15.2 lbs; Left: 39.9 lbs  COORDINATION: 9 Hole Peg test: Right:  43.45 sec; Left: 47.67 sec    TODAY'S TREATMENT:                                                                                                                               Seated on physioball (w/ gait belt) - worked on A/P pelvic tilts, lateral trunk flexion bilaterally, leg lifts to activate trunk/abdominals, and trunk correction by lengthening Lt side and activated Rt side w/ cues for head positioning to assist in correcting trunk.   Seated on compliant surface (green balance disc) while performing ipsilateral reaching LUE and contralateral reaching RUE with trunk rotation to Rt/Lt side and cues to turn upper trunk when rotating to Rt side.    PATIENT EDUCATION: Education details:  continued to cue for posture for functional tasks  Person educated: Patient and Spouse Education method: Explanation, Demonstration, Tactile cues, and Verbal cues Education comprehension: verbalized understanding, returned demonstration, and verbal cues required  HOME EXERCISE PROGRAM: 08/18/22: Yellow putty HEP and coordination HEP 08/22/22:  Cane HEP   GOALS: Goals reviewed with patient? Yes  SHORT TERM GOALS: Target date: 09/09/22  Independent with HEP for Rt shoulder ROM, Rt grip strength, and bilateral coordination Goal status: MET  2.  Pt to verbalize understanding with A/E to increase ease with ADLS (LH brush, button hook, etc)  Goal status: MET  3.  Pt to eat 50% with Rt dominant hand and brush teeth 25% with Rt hand Goal status: PARTIALLY MET (performing brushing w/ dentures, only 25% eating with Rt hand)   4.  Pt to consistently doff pull over shirt I'ly Goal status: MET   LONG TERM GOALS: Target date: 10/08/22  Independent with updated HEP PRN Goal status: INITIAL  2.  Pt to eat 75% with Rt hand, and perform grooming 50% with Rt hand using A/E prn for brushing hair Baseline:  Goal status: INITIAL  3.  Pt to improve bilateral coordination by 5 sec on 9 hole peg test  Baseline: Rt  = 43.45 sec, Lt = 47.67 sec Goal status: INITIAL  4.  Pt to improve Rt hand grip strength by 5 lbs  Baseline: 15.2 lbs Goal status: INITIAL  5.  Pt to improve RUE sh flexion to 70* for greater ease with dressing, grooming  and low to mid level reaching Baseline:  Goal status: INITIAL   ASSESSMENT:  CLINICAL IMPRESSION: Pt overall has improved in posture, especially when patient is actively thinking about it and engaging core. Neck pain has also improved. Pt remains limited in RUE ROM   PERFORMANCE DEFICITS: in functional skills including ADLs, IADLs, coordination, dexterity, edema, ROM, strength, pain, flexibility, Fine motor control, Gross motor control, mobility, body mechanics, endurance, decreased knowledge of use of DME, and UE functional use.   IMPAIRMENTS: are limiting patient from ADLs, IADLs, and leisure.   CO-MORBIDITIES: may have co-morbidities  that affects occupational performance. Patient will benefit from skilled OT to address above impairments and improve overall function.  MODIFICATION OR ASSISTANCE TO COMPLETE EVALUATION: No modification of tasks or assist necessary to complete an evaluation.  OT OCCUPATIONAL PROFILE AND HISTORY: Problem focused assessment: Including review of records relating to presenting problem.  CLINICAL DECISION MAKING: Moderate - several treatment options, min-mod task modification necessary  REHAB POTENTIAL: Fair time since onset and gradual decline over last few years  EVALUATION COMPLEXITY: Low   PLAN:  OT FREQUENCY: 2x/week  OT DURATION: 8 weeks, plus evaluation  PLANNED INTERVENTIONS: self care/ADL training, therapeutic exercise, therapeutic activity, neuromuscular re-education, manual therapy, passive range of motion, functional mobility training, splinting, paraffin, fluidotherapy, moist heat, patient/family education, coping strategies training, and DME and/or AE instructions  RECOMMENDED OTHER SERVICES: none at this  time  CONSULTED AND AGREED WITH PLAN OF CARE: Patient and family member/caregiver  PLAN FOR NEXT SESSION:  continue to work on lateral trunk/head flexion, RUE ROM and function, ? Tall kneeling if pt can tolerate, anticipate d/c by end of current POC  Hans Eden, OTR/L 09/22/2022, 9:37 AM

## 2022-09-22 NOTE — Therapy (Signed)
OUTPATIENT PHYSICAL THERAPY NEURO TREATMENT   Patient Name: Kim Sutton MRN: [id number] DOB:30-Nov-1933, 89 y.o., female Today's Date: 09/22/2022   PCP: Lajean Manes, MD (retired) REFERRING PROVIDER: Kem Boroughs, MD    END OF SESSION:  PT End of Session - 09/22/22 1015     Visit Number 11    Number of Visits 17    Date for PT Re-Evaluation 11/01/22    Authorization Type Medicare    Progress Note Due on Visit 10    PT Start Time [id number]    PT Stop Time 1055    PT Time Calculation (min) 40 min    Equipment Utilized During Treatment Gait belt    Activity Tolerance Patient tolerated treatment well;Other (comment)   hard of hearing, repetition needed   Behavior During Therapy [facility] for tasks assessed/performed                  Past Medical History:  Diagnosis Date   Adrenal cortical adenoma of left adrenal gland    stable since 2009;  MRI in 04/2010 - no further scans   CAD (coronary artery disease)    a. s/p NSTEMI in 2001 tx with BMS to RCA   Colon polyps    HLD (hyperlipidemia)    Hx of cardiovascular stress test    Lexiscan Myoview (09/2013):  No ischemia, EF 77%, Low Risk.   Hyperglycemia    Hypertension    Myoclonus    on neurontin per neuro at [facility]   Nephrolithiasis    Nodule of right lung    a. 10m on CXR 10/2009;  CT 05/2010 ok - no further scans   Tobacco abuse    Past Surgical History:  Procedure Laterality Date   CHOLECYSTECTOMY     CORONARY ANGIOPLASTY WITH STENT PLACEMENT     TOTAL ABDOMINAL HYSTERECTOMY     Patient Active Problem List   Diagnosis Date Noted   CAD (coronary artery disease) 09/04/2013   HTN (hypertension) 09/04/2013   HLD (hyperlipidemia) 09/04/2013   Tobacco abuse 09/04/2013    ONSET DATE: 08/05/2022  REFERRING DIAG: G37.3 (ICD-10-CM) - Acute transverse myelitis in demyelinating disease of central nervous system  THERAPY DIAG:  Abnormal posture  Other lack of coordination  Muscle weakness  (generalized)  Unsteadiness on feet  Other abnormalities of gait and mobility  Rationale for Evaluation and Treatment: Rehabilitation  SUBJECTIVE:                                                                                                                                                                                             SUBJECTIVE STATEMENT:  Pt reports no acute changes since last visit. Minor aches but no real pain today. Pt reports her exercises have been going ok at home.  Pt accompanied by: self and significant other husband Kim Sutton  PERTINENT HISTORY: CAD, hyperlipidemia, hypertension, transverse myelitis  PAIN:  Are you having pain? Yes: NPRS scale: 6/10 Pain location: right rib cage and shoulder blade Pain description: pressure Aggravating factors: moving, lifting Relieving factors: tylenol  PRECAUTIONS: Fall -Pt hears best out of LEFT ear (hard of hearing)  PATIENT GOALS: "be able to get up and walk around and do everything I used to do"  TODAY'S TREATMENT:                                                                                                                             Neuro Re-ed: In // bars to increase RLE step length and height: Ambulation through 4" hurdles forwards, difficulty clearing RLE with circumduction noted Transition to foam block stepovers forwards/back with RLE only, again exhibits occasional circumduction that improves with cues Added in 4# RLE ankle weight 2 x 10 reps forwards and lateral step overs  In // bars for hip strengthening: Sidesteps L/R 3 x 10 ft each direction Progression to resisted sidesteps with RTB 3 x 10 ft each direction Added to HEP, see bolded below  TherEx: NuStep level 3 for 8 minutes using BUE/BLEs for neural priming for reciprocal movement, dynamic cardiovascular warmup and increased amplitude of stepping. Pt reports not feeling very fatigued following exercise, will increase challenge next  session.   PATIENT EDUCATION: Education details: Continue HEP, added to HEP Person educated: Patient and Spouse Education method: Explanation, Media planner, and Handouts Education comprehension: verbalized understanding, returned demonstration, and needs further education  HOME EXERCISE PROGRAM: Access Code: [email or web address] URL: [email or web address] Date: 08/18/2022 Prepared by: Elease Etienne  Exercises - Seated Hamstring Stretch  - 1 x daily - 4-5 x weekly - 1 sets - 2 reps - 45 seconds hold - Seated Single Knee to Chest  - 1 x daily - 4-5 x weekly - 1 sets - 2 reps - 20-30 seconds hold - Gastroc Stretch on Wall  - 1 x daily - 5 x weekly - 1 sets - 1-2 reps - 45 seconds hold - Standing Hip Extension with Counter Support  - 1 x daily - 5 x weekly - 2 sets - 10 reps - Seated Upper Trapezius Stretch  - 1 x daily - 7 x weekly - 1 sets - 5 reps - 30 sec hold - Seated Cervical Rotation AROM  - 1 x daily - 7 x weekly - 1 sets - 5 reps - 30 sec hold - Sit to Stand with Armchair  - 1 x daily - 7 x weekly - 3 sets - 10 reps - Mini Squat with Counter Support  - 1 x daily - 7 x weekly - 3 sets - 10 reps - Seated Hamstring Curls with Resistance  - 1 x daily -  4 x weekly - 2 sets - 10 reps - Side Stepping with Resistance at Ankles and Counter Support  - 1 x daily - 7 x weekly - 3 sets - 10 reps  Walking Program: Walk with your family.  Starting this week, walk 5 min per day for 7 days. Following week add 5 minutes to your total time. Week 1: 5 min Week 2: 10 min Week 3: 15 min Week 4: 20 min...Marland KitchenMarland KitchenUntil you can walk to 30 min per day    GOALS: Goals reviewed with patient? Yes  SHORT TERM GOALS: Target date: 09/06/2022   Pt will be independent with initial HEP for improved strength, balance, transfers and gait. Baseline: Goal status: MET  2.  Pt will improve 5 x STS to less than or equal to 20 seconds to demonstrate improved functional strength and transfer efficiency.   Baseline: 25.43 sec (1/9), 24.97 sec (2/5) Goal status: IN PROGRESS  3.  Pt will improve gait velocity to at least 2.5 ft/sec for improved gait efficiency and performance at Supervision level  Baseline: 2.2 ft/sec with SPC and CGA (1/9), 1.64 ft/sec with SPC at mod I level (2/5) Goal status: IN PROGRESS  4.  Pt will improve her cervical ROM of R rotation and R lateral flexion >/=10 degrees to demonstrate improved function Baseline: 40 degrees R rotation, 5 degrees R lateral flexion (1/9), 55 degrees R rotation, 15 degrees R lateral flexion (2/5) Goal status: MET  5.  Pt will be able to ambulate x 150 ft with LRAD at Supervision level Baseline: 35 ft with SPC and CGA (1/9), 200 ft with SPC at mod I level (2/5) Goal status: MET   LONG TERM GOALS: Target date: 10/04/2022   Pt will be independent with final HEP for improved strength, balance, transfers and gait. Baseline:  Goal status: INITIAL  2.  Pt will improve 5 x STS to less than or equal to 20 seconds to demonstrate improved functional strength and transfer efficiency.  Baseline: 25.43 sec (1/9), 24.97 sec (2/5) Goal status: REVISED  3.  Pt will improve gait velocity to at least 2.5 ft/sec for improved gait efficiency and performance at mod I level  Baseline: 2.2 ft/sec with [facility] and CGA (1/9), 1.64 ft/sec with SPC at mod I level (2/5) Goal status: REVISED  4.  Pt will be able to ambulate x 500 ft with LRAD at mod I level Baseline: 35 ft with SPC and CGA (1/9), 200 ft with SPC at mod I level (2/5) Goal status: INITIAL  5.  Pt will improve normal TUG to less than or equal to 15 seconds for improved functional mobility and decreased fall risk. Baseline: 17.4 sec with SPC (1/9) Goal status: INITIAL  ASSESSMENT:  CLINICAL IMPRESSION: Emphasis of skilled PT session on working on global endurance training, working on increasing RLE step height and step length, and working on hip strengthening. Pt continues to exhibit weak R hip  flexors and extensors leading to gait deviations. Pt continues to benefit from skilled therapy services to address LTGs. Continue POC.     OBJECTIVE IMPAIRMENTS: Abnormal gait, decreased balance, decreased endurance, decreased mobility, difficulty walking, decreased ROM, decreased strength, impaired perceived functional ability, impaired flexibility, impaired UE functional use, improper body mechanics, postural dysfunction, and pain.   ACTIVITY LIMITATIONS: carrying, lifting, bending, squatting, stairs, transfers, and bed mobility  PARTICIPATION LIMITATIONS: driving and community activity  PERSONAL FACTORS: Age, Time since onset of injury/illness/exacerbation, and 1-2 comorbidities:    CAD, hyperlipidemia, hypertensionare  also affecting patient's functional outcome.   REHAB POTENTIAL: Good  CLINICAL DECISION MAKING: Stable/uncomplicated  EVALUATION COMPLEXITY: Low  PLAN:  PT FREQUENCY: 2x/week  PT DURATION: 8 weeks  PLANNED INTERVENTIONS: Therapeutic exercises, Therapeutic activity, Neuromuscular re-education, Balance training, Gait training, Patient/Family education, Self Care, Joint mobilization, Stair training, Vestibular training, Canalith repositioning, Visual/preceptual remediation/compensation, Orthotic/Fit training, DME instructions, Aquatic Therapy, Dry Needling, Electrical stimulation, Spinal mobilization, Cryotherapy, Moist heat, Taping, Manual therapy, and Re-evaluation  PLAN FOR NEXT SESSION: how is HEP? modify HEP prn for RLE ROM, strengthening (hip ROM, HS and gastroc stretches, sit to stands), endurance, SciFit vs NuStep, standing marches, resisted sidesteps, RLE hip and knee flexor strengthening, eccentric step downs, RLE step length and clearance, wants to work towards moving better overall before her granddaughters wedding in September   Excell Seltzer, PT, DPT, CSRS 09/22/2022, 10:58 AM

## 2022-09-26 ENCOUNTER — Ambulatory Visit: Payer: Medicare Other | Admitting: Occupational Therapy

## 2022-09-26 ENCOUNTER — Ambulatory Visit: Payer: Medicare Other | Admitting: Physical Therapy

## 2022-09-26 DIAGNOSIS — R2681 Unsteadiness on feet: Secondary | ICD-10-CM

## 2022-09-26 DIAGNOSIS — M6281 Muscle weakness (generalized): Secondary | ICD-10-CM

## 2022-09-26 DIAGNOSIS — R293 Abnormal posture: Secondary | ICD-10-CM

## 2022-09-26 DIAGNOSIS — R2689 Other abnormalities of gait and mobility: Secondary | ICD-10-CM | POA: Diagnosis not present

## 2022-09-26 DIAGNOSIS — R278 Other lack of coordination: Secondary | ICD-10-CM | POA: Diagnosis not present

## 2022-09-26 DIAGNOSIS — R29818 Other symptoms and signs involving the nervous system: Secondary | ICD-10-CM | POA: Diagnosis not present

## 2022-09-26 NOTE — Therapy (Signed)
OUTPATIENT OCCUPATIONAL THERAPY NEURO TREATMENT  Patient Name: Kim Sutton MRN: [id number] DOB:1933/08/11, 89 y.o., female Today's Date: 09/26/2022  PCP: Dr. Louis Matte REFERRING PROVIDER: Kem Boroughs, MD  END OF SESSION:  OT End of Session - 09/26/22 0853     Visit Number 12    Number of Visits 17    Date for OT Re-Evaluation 10/08/22    Authorization Type MCR, [employer] - covered 100%    Authorization - Number of Visits 12    Progress Note Due on Visit 35    OT Start Time 0847    OT Stop Time 0930    OT Time Calculation (min) 43 min    Activity Tolerance Patient tolerated treatment well    Behavior During Therapy [facility] for tasks assessed/performed                  Past Medical History:  Diagnosis Date   Adrenal cortical adenoma of left adrenal gland    stable since 2009;  MRI in 04/2010 - no further scans   CAD (coronary artery disease)    a. s/p NSTEMI in 2001 tx with BMS to RCA   Colon polyps    HLD (hyperlipidemia)    Hx of cardiovascular stress test    Lexiscan Myoview (09/2013):  No ischemia, EF 77%, Low Risk.   Hyperglycemia    Hypertension    Myoclonus    on neurontin per neuro at [facility]   Nephrolithiasis    Nodule of right lung    a. 75m on CXR 10/2009;  CT 05/2010 ok - no further scans   Tobacco abuse    Past Surgical History:  Procedure Laterality Date   CHOLECYSTECTOMY     CORONARY ANGIOPLASTY WITH STENT PLACEMENT     TOTAL ABDOMINAL HYSTERECTOMY     Patient Active Problem List   Diagnosis Date Noted   CAD (coronary artery disease) 09/04/2013   HTN (hypertension) 09/04/2013   HLD (hyperlipidemia) 09/04/2013   Tobacco abuse 09/04/2013    ONSET DATE: 08/05/2022  REFERRING DIAG: G37.3 (ICD-10-CM) - Acute transverse myelitis in demyelinating disease of central nervous system  (History of cervical transverse myelitis resulting in right sided weakness; more weakness on right side now which is likely due to debility and aging and right shoulder and  wrist pain after falls (has been evaluated in ER)  Needs strengthening, ROM, endurance, appropriate use of cane)  THERAPY DIAG:  Abnormal posture  Other lack of coordination  Muscle weakness (generalized)  Unsteadiness on feet  Rationale for Evaluation and Treatment: Rehabilitation  SUBJECTIVE:   SUBJECTIVE STATEMENT: No recent falls  Pt accompanied by: significant other (husband)   PERTINENT HISTORY: Kim Sutton an 89year old right-handed female with a history of hypertension, hyperlipidemia, coronary artery disease status post stent and cervical transverse myelitis presenting to clinic with chief concern of history of transverse myelitis and multiple falls.  PRECAUTIONS: Fall and Other: stent   HARD OF HEARING  WEIGHT BEARING RESTRICTIONS: No  PAIN:  PAIN:  Are you having pain? Yes: NPRS scale: 3/10 today - in back today d/t back spasms Pain location: Lt neck, 4/10 Rt shoulder/upper traps w/ sh flexion at times Pain description: sore Aggravating factors: exercise Relieving factors: rest, tylenol    FALLS: Has patient fallen in last 6 months? Yes. Number of falls 2  LIVING ENVIRONMENT: Lives with: lives with their spouse Pt lives in 2 story house, but lives on first floor with 6 steps to enter from garage, railing  on Rt going up Has following equipment at home: Single point cane, Walker - 2 wheeled, Grab bars, and rollator, built in shower seat, grab bars in shower and toilet area  PLOF:  occasional min assist for BADLS in the last year  PATIENT GOALS: increase RUE/hand use  OBJECTIVE:   HAND DOMINANCE: Right   UPPER EXTREMITY ROM:    LUE AROM WNL's  RUE: shoulder flex approx 50*, abduction 45*, ER approx 50%, IR 75%; elbow, forearm and wrist WFL's. Rt hand movements slightly weaker but can do all intrinsic movement with difficulty  HAND FUNCTION: Grip strength: Right: 15.2 lbs; Left: 39.9 lbs  COORDINATION: 9 Hole Peg test: Right: 43.45 sec; Left:  47.67 sec    TODAY'S TREATMENT:                                                                                                                               Supine (elevated w/ wedge): chest press x 10, followed by BUE sh flex/ext x 10 w/ min facilitation RUE for elbow ext  Pt point tender along Rt upper traps and middle deltoid therefore kinesiotaped to relax upper traps and middle deltoid. Pt/husband educated in wear and care of tape and caution when removing tape d/t skin (pt's age)   66 AA/ROM in low range sh flex, then in gravity elim plane and slightly elevated surface. RUE AA/ROM on slightly inclined surface in sh flex with elbow ext, followed by open chain reaching on gravity elim surface.   Functional low range reaching RUE with trunk rotation during ipsilateral and contralateral reaching   (Pt was not feeling great today d/t back spasms therefore did not try tall kneeling today)  PATIENT EDUCATION: Education details:  continued to cue for posture for functional tasks  Person educated: Patient and Spouse Education method: Explanation, Demonstration, Tactile cues, and Verbal cues Education comprehension: verbalized understanding, returned demonstration, and verbal cues required  HOME EXERCISE PROGRAM: 08/18/22: Yellow putty HEP and coordination HEP 08/22/22:  Cane HEP   GOALS: Goals reviewed with patient? Yes  SHORT TERM GOALS: Target date: 09/09/22  Independent with HEP for Rt shoulder ROM, Rt grip strength, and bilateral coordination Goal status: MET  2.  Pt to verbalize understanding with A/E to increase ease with ADLS (LH brush, button hook, etc)  Goal status: MET  3.  Pt to eat 50% with Rt dominant hand and brush teeth 25% with Rt hand Goal status: PARTIALLY MET (performing brushing w/ dentures, only 25% eating with Rt hand)   4.  Pt to consistently doff pull over shirt I'ly Goal status: MET   LONG TERM GOALS: Target date: 10/08/22  Independent with updated HEP PRN Goal status: INITIAL  2.  Pt to eat 75% with Rt hand, and perform grooming 50% with Rt hand using A/E prn for brushing hair Baseline:  Goal status: INITIAL  3.  Pt to improve bilateral coordination by 5 sec on 9 hole peg test  Baseline: Rt = 43.45 sec, Lt = 47.67 sec Goal status: INITIAL  4.  Pt to improve Rt hand grip strength by 5 lbs  Baseline:  15.2 lbs Goal status: INITIAL  5.  Pt to improve RUE sh flexion to 70* for greater ease with dressing, grooming and low to mid level reaching Baseline:  Goal status: INITIAL   ASSESSMENT:  CLINICAL IMPRESSION: Pt overall has improved in posture, especially when patient is actively thinking about it and engaging core. Neck pain has also improved. Pt remains limited in RUE ROM   PERFORMANCE DEFICITS: in functional skills including ADLs, IADLs, coordination, dexterity, edema, ROM, strength, pain, flexibility, Fine motor control, Gross motor control, mobility, body mechanics, endurance, decreased knowledge of use of DME, and UE functional use.   IMPAIRMENTS: are limiting patient from ADLs, IADLs, and leisure.   CO-MORBIDITIES: may have co-morbidities  that affects occupational performance. Patient will benefit from skilled OT to address above impairments and improve overall function.  MODIFICATION OR ASSISTANCE TO COMPLETE EVALUATION: No modification of tasks or assist necessary to complete an evaluation.  OT OCCUPATIONAL PROFILE AND HISTORY: Problem focused assessment: Including review of records relating to presenting problem.  CLINICAL DECISION MAKING: Moderate - several treatment options, min-mod task modification necessary  REHAB POTENTIAL: Fair time since onset and gradual decline over last few years  EVALUATION COMPLEXITY: Low   PLAN:  OT FREQUENCY: 2x/week  OT DURATION: 8 weeks, plus evaluation  PLANNED INTERVENTIONS: self care/ADL training, therapeutic exercise, therapeutic activity, neuromuscular re-education, manual therapy, passive range of motion, functional mobility training, splinting, paraffin, fluidotherapy, moist heat, patient/family education, coping strategies training, and DME and/or AE instructions  RECOMMENDED OTHER SERVICES: none at this time  CONSULTED AND AGREED WITH PLAN OF CARE: Patient and family member/caregiver  PLAN FOR NEXT SESSION:  continue to  work on lateral trunk/head flexion, RUE ROM and function, ? Tall kneeling if pt can tolerate, anticipate d/c by end of current POC  Hans Eden, OTR/L 09/26/2022, 8:54 AM

## 2022-09-26 NOTE — Therapy (Signed)
OUTPATIENT PHYSICAL THERAPY NEURO TREATMENT   Patient Name: Kim Sutton MRN: [place]:[id number] DOB:[birth year], 89 y.o., female Today's Date: 09/26/2022   PCP: Kim Manes, MD (retired) REFERRING PROVIDER: Kem Boroughs, MD    END OF SESSION:  PT End of Session - 09/26/22 0930     Visit Number 12    Number of Visits 17    Date for PT Re-Evaluation 11/01/22    Authorization Type Medicare    Progress Note Due on Visit 10    PT Start Time 0930    PT Stop Time 1015    PT Time Calculation (min) 45 min    Equipment Utilized During Treatment Gait belt    Activity Tolerance Patient tolerated treatment well;Other (comment)   hard of hearing, repetition needed   Behavior During Therapy [facility] for tasks assessed/performed                   Past Medical History:  Diagnosis Date   Adrenal cortical adenoma of left adrenal gland    stable since 2009;  MRI in 04/2010 - no further scans   CAD (coronary artery disease)    a. s/p NSTEMI in 2001 tx with BMS to RCA   Colon polyps    HLD (hyperlipidemia)    Hx of cardiovascular stress test    Lexiscan Myoview (09/2013):  No ischemia, EF 77%, Low Risk.   Hyperglycemia    Hypertension    Myoclonus    on neurontin per neuro at [facility]   Nephrolithiasis    Nodule of right lung    a. 32m on CXR 10/2009;  CT 05/2010 ok - no further scans   Tobacco abuse    Past Surgical History:  Procedure Laterality Date   CHOLECYSTECTOMY     CORONARY ANGIOPLASTY WITH STENT PLACEMENT     TOTAL ABDOMINAL HYSTERECTOMY     Patient Active Problem List   Diagnosis Date Noted   CAD (coronary artery disease) 09/04/2013   HTN (hypertension) 09/04/2013   HLD (hyperlipidemia) 09/04/2013   Tobacco abuse 09/04/2013    ONSET DATE: 08/05/2022  REFERRING DIAG: G37.3 (ICD-10-CM) - Acute transverse myelitis in demyelinating disease of central nervous system  THERAPY DIAG:  Abnormal posture  Muscle weakness (generalized)  Unsteadiness on feet  Other  abnormalities of gait and mobility  Rationale for Evaluation and Treatment: Rehabilitation  SUBJECTIVE:                                                                                                                                                                                             SUBJECTIVE STATEMENT: Pt reports no falls  or other acute changes since last visit. Pt reports no pain today, just achiness. Pt reports she is able to do more work around the house without using her cane. Pt reports she went up/down her stairs by herself with no issues although her husband prefers to be with the patient for safety reasons.  Pt accompanied by: self and significant other husband Kim Sutton  PERTINENT HISTORY: CAD, hyperlipidemia, hypertension, transverse myelitis  PAIN:  Are you having pain? Yes: NPRS scale: 6/10 Pain location: right rib cage and shoulder blade Pain description: pressure Aggravating factors: moving, lifting Relieving factors: tylenol  PRECAUTIONS: Fall -Pt hears best out of LEFT ear (hard of hearing)  PATIENT GOALS: "be able to get up and walk around and do everything I used to do"  TODAY'S TREATMENT:                                                                                                                             Neuro Re-ed: In // bars for SLS stability and standing balance with no UE support with CGA: Semi-circle flat target taps alt L/R x 10 reps Gait with alt L/R flat target taps 6 x 10 ft Gait with alt L/R gumdrop taps 6 x 10 ft Sidesteps with L/R gumdrop taps 6 x 10 ft each direction  With SPC and min A: Gait across uneven mat on floor  Added in alt L/R gumdrop taps and cone weave for increased challenge, onset of fatigue with seated rest break needed   Gait: Gait pattern: decreased hip/knee flexion- Right, decreased hip/knee flexion- Left, and shuffling Distance walked: 200 ft Assistive device utilized: Single point cane Level of assistance:  CGA Comments: focus on turning all the way around both to the R and to the L with cues with focus on maintaining balance with turns   TherEx: SciFit multi-peaks level 1 for 5 minutes using BUE/BLEs for neural priming for reciprocal movement, dynamic cardiovascular warmup and increased amplitude of stepping.    PATIENT EDUCATION: Education details: Continue HEP Person educated: Patient and Spouse Education method: Customer service manager Education comprehension: verbalized understanding, returned demonstration, and needs further education  HOME EXERCISE PROGRAM: Access Code: [email or web address] URL: [email or web address] Date: 08/18/2022 Prepared by: Elease Etienne  Exercises - Seated Hamstring Stretch  - 1 x daily - 4-5 x weekly - 1 sets - 2 reps - 45 seconds hold - Seated Single Knee to Chest  - 1 x daily - 4-5 x weekly - 1 sets - 2 reps - 20-30 seconds hold - Gastroc Stretch on Wall  - 1 x daily - 5 x weekly - 1 sets - 1-2 reps - 45 seconds hold - Standing Hip Extension with Counter Support  - 1 x daily - 5 x weekly - 2 sets - 10 reps - Seated Upper Trapezius Stretch  - 1 x daily - 7 x weekly - 1 sets - 5 reps - 30 sec hold - Seated Cervical Rotation AROM  -  1 x daily - 7 x weekly - 1 sets - 5 reps - 30 sec hold - Sit to Stand with Armchair  - 1 x daily - 7 x weekly - 3 sets - 10 reps - Mini Squat with Counter Support  - 1 x daily - 7 x weekly - 3 sets - 10 reps - Seated Hamstring Curls with Resistance  - 1 x daily - 4 x weekly - 2 sets - 10 reps - Side Stepping with Resistance at Ankles and Counter Support  - 1 x daily - 7 x weekly - 3 sets - 10 reps  Walking Program: Walk with your family.  Starting this week, walk 5 min per day for 7 days. Following week add 5 minutes to your total time. Week 1: 5 min Week 2: 10 min Week 3: 15 min Week 4: 20 min...Marland KitchenMarland KitchenUntil you can walk to 30 min per day    GOALS: Goals reviewed with patient? Yes  SHORT TERM GOALS: Target  date: 09/06/2022   Pt will be independent with initial HEP for improved strength, balance, transfers and gait. Baseline: Goal status: MET  2.  Pt will improve 5 x STS to less than or equal to 20 seconds to demonstrate improved functional strength and transfer efficiency.  Baseline: 25.43 sec (1/9), 24.97 sec (2/5) Goal status: IN PROGRESS  3.  Pt will improve gait velocity to at least 2.5 ft/sec for improved gait efficiency and performance at Supervision level  Baseline: 2.2 ft/sec with SPC and CGA (1/9), 1.64 ft/sec with SPC at mod I level (2/5) Goal status: IN PROGRESS  4.  Pt will improve her cervical ROM of R rotation and R lateral flexion >/=10 degrees to demonstrate improved function Baseline: 40 degrees R rotation, 5 degrees R lateral flexion (1/9), 55 degrees R rotation, 15 degrees R lateral flexion (2/5) Goal status: MET  5.  Pt will be able to ambulate x 150 ft with LRAD at Supervision level Baseline: 35 ft with SPC and CGA (1/9), 200 ft with SPC at mod I level (2/5) Goal status: MET   LONG TERM GOALS: Target date: 10/04/2022   Pt will be independent with final HEP for improved strength, balance, transfers and gait. Baseline:  Goal status: INITIAL  2.  Pt will improve 5 x STS to less than or equal to 20 seconds to demonstrate improved functional strength and transfer efficiency.  Baseline: 25.43 sec (1/9), 24.97 sec (2/5) Goal status: REVISED  3.  Pt will improve gait velocity to at least 2.5 ft/sec for improved gait efficiency and performance at mod I level  Baseline: 2.2 ft/sec with [facility] and CGA (1/9), 1.64 ft/sec with SPC at mod I level (2/5) Goal status: REVISED  4.  Pt will be able to ambulate x 500 ft with LRAD at mod I level Baseline: 35 ft with SPC and CGA (1/9), 200 ft with SPC at mod I level (2/5) Goal status: INITIAL  5.  Pt will improve normal TUG to less than or equal to 15 seconds for improved functional mobility and decreased fall risk. Baseline: 17.4 sec  with SPC (1/9) Goal status: INITIAL  ASSESSMENT:  CLINICAL IMPRESSION: Emphasis of skilled PT session on working on SLS stability and dynamic standing balance. Pt continues to exhibit decreased balance when standing on RLE as compared to LLE. Pt also continues to exhibit shuffling gait pattern and decreased balance with turns. Pt continues to benefit from skilled therapy services to address ongoing balance impairments and decreased  LE strength leading to increased fall risk. Continue POC.     OBJECTIVE IMPAIRMENTS: Abnormal gait, decreased balance, decreased endurance, decreased mobility, difficulty walking, decreased ROM, decreased strength, impaired perceived functional ability, impaired flexibility, impaired UE functional use, improper body mechanics, postural dysfunction, and pain.   ACTIVITY LIMITATIONS: carrying, lifting, bending, squatting, stairs, transfers, and bed mobility  PARTICIPATION LIMITATIONS: driving and community activity  PERSONAL FACTORS: Age, Time since onset of injury/illness/exacerbation, and 1-2 comorbidities:    CAD, hyperlipidemia, hypertensionare also affecting patient's functional outcome.   REHAB POTENTIAL: Good  CLINICAL DECISION MAKING: Stable/uncomplicated  EVALUATION COMPLEXITY: Low  PLAN:  PT FREQUENCY: 2x/week  PT DURATION: 8 weeks  PLANNED INTERVENTIONS: Therapeutic exercises, Therapeutic activity, Neuromuscular re-education, Balance training, Gait training, Patient/Family education, Self Care, Joint mobilization, Stair training, Vestibular training, Canalith repositioning, Visual/preceptual remediation/compensation, Orthotic/Fit training, DME instructions, Aquatic Therapy, Dry Needling, Electrical stimulation, Spinal mobilization, Cryotherapy, Moist heat, Taping, Manual therapy, and Re-evaluation  PLAN FOR NEXT SESSION: how is HEP? modify HEP prn for RLE ROM, strengthening (hip ROM, HS and gastroc stretches, sit to stands), endurance, SciFit vs  NuStep, standing marches, RLE hip and knee flexor strengthening, eccentric step downs, RLE step length and clearance, wants to work towards moving better overall before her granddaughters wedding in September (wants to dance!); work on low back stretches and core strengthening to address back pain with mobility   Excell Seltzer, PT, DPT, CSRS 09/26/2022, 10:17 AM

## 2022-09-29 ENCOUNTER — Ambulatory Visit: Payer: Medicare Other | Admitting: Physical Therapy

## 2022-09-29 ENCOUNTER — Encounter: Payer: Medicare Other | Admitting: Occupational Therapy

## 2022-09-29 DIAGNOSIS — M6281 Muscle weakness (generalized): Secondary | ICD-10-CM

## 2022-09-29 DIAGNOSIS — R2689 Other abnormalities of gait and mobility: Secondary | ICD-10-CM | POA: Diagnosis not present

## 2022-09-29 DIAGNOSIS — R293 Abnormal posture: Secondary | ICD-10-CM | POA: Diagnosis not present

## 2022-09-29 DIAGNOSIS — R278 Other lack of coordination: Secondary | ICD-10-CM | POA: Diagnosis not present

## 2022-09-29 DIAGNOSIS — R29818 Other symptoms and signs involving the nervous system: Secondary | ICD-10-CM | POA: Diagnosis not present

## 2022-09-29 DIAGNOSIS — R2681 Unsteadiness on feet: Secondary | ICD-10-CM

## 2022-09-29 NOTE — Therapy (Signed)
OUTPATIENT PHYSICAL THERAPY NEURO TREATMENT   Patient Name: Kim Sutton MRN: [id number] DOB:September 03, 1933, 89 y.o., female Today's Date: 09/29/2022   PCP: Lajean Manes, MD (retired) REFERRING PROVIDER: Kem Boroughs, MD    END OF SESSION:  PT End of Session - 09/29/22 0912     Visit Number 13    Number of Visits 17    Date for PT Re-Evaluation 11/01/22    Authorization Type Medicare    Progress Note Due on Visit 10    PT Start Time 0905    PT Stop Time 0955    PT Time Calculation (min) 50 min    Equipment Utilized During Treatment Gait belt    Activity Tolerance Patient tolerated treatment well;Other (comment)   hard of hearing, repetition needed   Behavior During Therapy [facility] for tasks assessed/performed                    Past Medical History:  Diagnosis Date   Adrenal cortical adenoma of left adrenal gland    stable since 2009;  MRI in 04/2010 - no further scans   CAD (coronary artery disease)    a. s/p NSTEMI in 2001 tx with BMS to RCA   Colon polyps    HLD (hyperlipidemia)    Hx of cardiovascular stress test    Lexiscan Myoview (09/2013):  No ischemia, EF 77%, Low Risk.   Hyperglycemia    Hypertension    Myoclonus    on neurontin per neuro at [facility]   Nephrolithiasis    Nodule of right lung    a. 26m on CXR 10/2009;  CT 05/2010 ok - no further scans   Tobacco abuse    Past Surgical History:  Procedure Laterality Date   CHOLECYSTECTOMY     CORONARY ANGIOPLASTY WITH STENT PLACEMENT     TOTAL ABDOMINAL HYSTERECTOMY     Patient Active Problem List   Diagnosis Date Noted   CAD (coronary artery disease) 09/04/2013   HTN (hypertension) 09/04/2013   HLD (hyperlipidemia) 09/04/2013   Tobacco abuse 09/04/2013    ONSET DATE: 08/05/2022  REFERRING DIAG: G37.3 (ICD-10-CM) - Acute transverse myelitis in demyelinating disease of central nervous system  THERAPY DIAG:  Muscle weakness (generalized)  Unsteadiness on feet  Other abnormalities of  gait and mobility  Rationale for Evaluation and Treatment: Rehabilitation  SUBJECTIVE:                                                                                                                                                                                             SUBJECTIVE STATEMENT: Pt reports no falls or other  acute changes since last visit. Reports she wants to work on her hand. Having some aches in her neck but no pain.   Pt accompanied by: self and significant other husband [name]  PERTINENT HISTORY: CAD, hyperlipidemia, hypertension, transverse myelitis  PAIN:  Are you having pain? No  PRECAUTIONS: Fall -Pt hears best out of LEFT ear (hard of hearing)  PATIENT GOALS: "be able to get up and walk around and do everything I used to do"  TODAY'S TREATMENT:                                                                                                                         Ther Ex: SciFit multi-peaks level 2 for 8 minutes using BUE/BLEs for neural priming for reciprocal movement, dynamic cardiovascular warmup and increased amplitude of stepping. Pt required several short seated rest breaks throughout. Cued to use BUEs to assist w/BLE fatigue. RPE of 7/10 following activity    NMR  Blaze pods on random reach setting while sitting on green theraball for improved LE coordination, core stability and lateral weight shifting. Used 4 pods for 3 rounds of 2 min on/ 26mn off. CGA-min A throughout due to windswept pelvis to L and truncal lean:  Round 1 - 11 hits  Round 2 - 12 hits Round 3 - 10 hits  Pt had significant difficulty tapping to R side and occasionally used BUEs to move leg  Seated rows using yellow theraband for improved periscapular strength and posture:  X10 reps using RUE only, mod cues to reduce compensation strategy of rotation and retro lean to perform. Also cued to reduce shoulder shrug. Noted significant truncal lean to L side throughout.  X12 reps w/BUEs and  pt had increased difficulty performing due to inability to rely on compensation strategies.  Seated shoulder flexion in scapular plane, x10 reps using 2# weighted dowel. Noted heavy reliance on shoulder shrug to perform on R side, so provided manual block to R shoulder and cued pt to perform scapular retraction to perform. Pt only able to lift dowel a few inches if not allowed to shoulder shrug. Encouraged pt to perform seated scapular retraction at home to work on muscle recruitment and reduced shoulder shrug. Pt and husband verbalized understanding.   Gait: Gait pattern: decreased hip/knee flexion- Right, decreased hip/knee flexion- Left, and shuffling Distance walked: various clinic distances  Assistive device utilized: Single point cane and None Level of assistance: CGA Comments: Did not use cane throughout session    PATIENT EDUCATION: Education details: Continue HEP Person educated: Patient and Spouse Education method: ECustomer service managerEducation comprehension: verbalized understanding, returned demonstration, and needs further education  HOME EXERCISE PROGRAM: Access Code: [email or web address]: [email or web address] Date: 08/18/2022 Prepared by: MElease Etienne Exercises - Seated Hamstring Stretch  - 1 x daily - 4-5 x weekly - 1 sets - 2 reps - 45 seconds hold - Seated Single Knee to Chest  - 1 x daily - 4-5 x weekly - 1  sets - 2 reps - 20-30 seconds hold - Gastroc Stretch on Wall  - 1 x daily - 5 x weekly - 1 sets - 1-2 reps - 45 seconds hold - Standing Hip Extension with Counter Support  - 1 x daily - 5 x weekly - 2 sets - 10 reps - Seated Upper Trapezius Stretch  - 1 x daily - 7 x weekly - 1 sets - 5 reps - 30 sec hold - Seated Cervical Rotation AROM  - 1 x daily - 7 x weekly - 1 sets - 5 reps - 30 sec hold - Sit to Stand with Armchair  - 1 x daily - 7 x weekly - 3 sets - 10 reps - Mini Squat with Counter Support  - 1 x daily - 7 x weekly - 3 sets - 10  reps - Seated Hamstring Curls with Resistance  - 1 x daily - 4 x weekly - 2 sets - 10 reps - Side Stepping with Resistance at Ankles and Counter Support  - 1 x daily - 7 x weekly - 3 sets - 10 reps  Walking Program: Walk with your family.  Starting this week, walk 5 min per day for 7 days. Following week add 5 minutes to your total time. Week 1: 5 min Week 2: 10 min Week 3: 15 min Week 4: 20 min...Marland KitchenMarland KitchenUntil you can walk to 30 min per day    GOALS: Goals reviewed with patient? Yes  SHORT TERM GOALS: Target date: 09/06/2022   Pt will be independent with initial HEP for improved strength, balance, transfers and gait. Baseline: Goal status: MET  2.  Pt will improve 5 x STS to less than or equal to 20 seconds to demonstrate improved functional strength and transfer efficiency.  Baseline: 25.43 sec (1/9), 24.97 sec (2/5) Goal status: IN PROGRESS  3.  Pt will improve gait velocity to at least 2.5 ft/sec for improved gait efficiency and performance at Supervision level  Baseline: 2.2 ft/sec with SPC and CGA (1/9), 1.64 ft/sec with SPC at mod I level (2/5) Goal status: IN PROGRESS  4.  Pt will improve her cervical ROM of R rotation and R lateral flexion >/=10 degrees to demonstrate improved function Baseline: 40 degrees R rotation, 5 degrees R lateral flexion (1/9), 55 degrees R rotation, 15 degrees R lateral flexion (2/5) Goal status: MET  5.  Pt will be able to ambulate x 150 ft with LRAD at Supervision level Baseline: 35 ft with SPC and CGA (1/9), 200 ft with SPC at mod I level (2/5) Goal status: MET   LONG TERM GOALS: Target date: 10/04/2022   Pt will be independent with final HEP for improved strength, balance, transfers and gait. Baseline:  Goal status: INITIAL  2.  Pt will improve 5 x STS to less than or equal to 20 seconds to demonstrate improved functional strength and transfer efficiency.  Baseline: 25.43 sec (1/9), 24.97 sec (2/5) Goal status: REVISED  3.  Pt will improve  gait velocity to at least 2.5 ft/sec for improved gait efficiency and performance at mod I level  Baseline: 2.2 ft/sec with [facility] and CGA (1/9), 1.64 ft/sec with SPC at mod I level (2/5) Goal status: REVISED  4.  Pt will be able to ambulate x 500 ft with LRAD at mod I level Baseline: 35 ft with SPC and CGA (1/9), 200 ft with SPC at mod I level (2/5) Goal status: INITIAL  5.  Pt will improve normal TUG to  less than or equal to 15 seconds for improved functional mobility and decreased fall risk. Baseline: 17.4 sec with SPC (1/9) Goal status: INITIAL  ASSESSMENT:  CLINICAL IMPRESSION: Emphasis of skilled PT session on endurance, LE coordination, core stability and reduction of shoulder shrug compensation. Pt very fatigued today and reported she did not sleep well, resulting in increased difficulty moving RLE. Pt relies heavily on compensation strategies to move RUE and due to postural deficits, cannot fully weight shift to L to move RLE well. Pt very motivated to be able to dance and performed interventions well w/encouragement. Continue POC.      OBJECTIVE IMPAIRMENTS: Abnormal gait, decreased balance, decreased endurance, decreased mobility, difficulty walking, decreased ROM, decreased strength, impaired perceived functional ability, impaired flexibility, impaired UE functional use, improper body mechanics, postural dysfunction, and pain.   ACTIVITY LIMITATIONS: carrying, lifting, bending, squatting, stairs, transfers, and bed mobility  PARTICIPATION LIMITATIONS: driving and community activity  PERSONAL FACTORS: Age, Time since onset of injury/illness/exacerbation, and 1-2 comorbidities:    CAD, hyperlipidemia, hypertensionare also affecting patient's functional outcome.   REHAB POTENTIAL: Good  CLINICAL DECISION MAKING: Stable/uncomplicated  EVALUATION COMPLEXITY: Low  PLAN:  PT FREQUENCY: 2x/week  PT DURATION: 8 weeks  PLANNED INTERVENTIONS: Therapeutic exercises, Therapeutic  activity, Neuromuscular re-education, Balance training, Gait training, Patient/Family education, Self Care, Joint mobilization, Stair training, Vestibular training, Canalith repositioning, Visual/preceptual remediation/compensation, Orthotic/Fit training, DME instructions, Aquatic Therapy, Dry Needling, Electrical stimulation, Spinal mobilization, Cryotherapy, Moist heat, Taping, Manual therapy, and Re-evaluation  PLAN FOR NEXT SESSION: how is HEP? modify HEP prn for RLE ROM, strengthening (hip ROM, HS and gastroc stretches, sit to stands), endurance, SciFit vs NuStep, standing marches, RLE hip and knee flexor strengthening, eccentric step downs, RLE step length and clearance, wants to work towards moving better overall before her granddaughters wedding in September (wants to dance!); work on low back stretches and core strengthening to address back pain with mobility   Clayten Allcock E Daneya Hartgrove, PT, DPT 09/29/2022, 10:01 AM

## 2022-09-30 DIAGNOSIS — G373 Acute transverse myelitis in demyelinating disease of central nervous system: Secondary | ICD-10-CM | POA: Diagnosis not present

## 2022-10-03 ENCOUNTER — Ambulatory Visit: Payer: Medicare Other | Admitting: Occupational Therapy

## 2022-10-03 ENCOUNTER — Ambulatory Visit: Payer: Medicare Other | Attending: Psychiatry | Admitting: Physical Therapy

## 2022-10-03 ENCOUNTER — Encounter: Payer: Self-pay | Admitting: Occupational Therapy

## 2022-10-03 DIAGNOSIS — R2689 Other abnormalities of gait and mobility: Secondary | ICD-10-CM | POA: Diagnosis not present

## 2022-10-03 DIAGNOSIS — R2681 Unsteadiness on feet: Secondary | ICD-10-CM

## 2022-10-03 DIAGNOSIS — R293 Abnormal posture: Secondary | ICD-10-CM

## 2022-10-03 DIAGNOSIS — R278 Other lack of coordination: Secondary | ICD-10-CM | POA: Insufficient documentation

## 2022-10-03 DIAGNOSIS — M6281 Muscle weakness (generalized): Secondary | ICD-10-CM | POA: Insufficient documentation

## 2022-10-03 DIAGNOSIS — H04123 Dry eye syndrome of bilateral lacrimal glands: Secondary | ICD-10-CM | POA: Diagnosis not present

## 2022-10-03 DIAGNOSIS — H04213 Epiphora due to excess lacrimation, bilateral lacrimal glands: Secondary | ICD-10-CM | POA: Diagnosis not present

## 2022-10-03 DIAGNOSIS — R29818 Other symptoms and signs involving the nervous system: Secondary | ICD-10-CM | POA: Diagnosis not present

## 2022-10-03 DIAGNOSIS — H10413 Chronic giant papillary conjunctivitis, bilateral: Secondary | ICD-10-CM | POA: Diagnosis not present

## 2022-10-03 NOTE — Therapy (Signed)
OUTPATIENT PHYSICAL THERAPY NEURO TREATMENT   Patient Name: Kim Sutton MRN: [id number] DOB:09-02-1933, 89 y.o., female Today's Date: 10/03/2022   PCP: Lajean Manes, MD (retired) REFERRING PROVIDER: Kem Boroughs, MD    END OF SESSION:  PT End of Session - 10/03/22 0931     Visit Number 14    Number of Visits 17    Date for PT Re-Evaluation 11/01/22    Authorization Type Medicare    Progress Note Due on Visit 10    PT Start Time 0930    PT Stop Time 1012    PT Time Calculation (min) 42 min    Equipment Utilized During Treatment Gait belt    Activity Tolerance Patient tolerated treatment well;Other (comment)   hard of hearing, repetition needed   Behavior During Therapy [facility] for tasks assessed/performed                     Past Medical History:  Diagnosis Date   Adrenal cortical adenoma of left adrenal gland    stable since 2009;  MRI in 04/2010 - no further scans   CAD (coronary artery disease)    a. s/p NSTEMI in 2001 tx with BMS to RCA   Colon polyps    HLD (hyperlipidemia)    Hx of cardiovascular stress test    Lexiscan Myoview (09/2013):  No ischemia, EF 77%, Low Risk.   Hyperglycemia    Hypertension    Myoclonus    on neurontin per neuro at [facility]   Nephrolithiasis    Nodule of right lung    a. 91m on CXR 10/2009;  CT 05/2010 ok - no further scans   Tobacco abuse    Past Surgical History:  Procedure Laterality Date   CHOLECYSTECTOMY     CORONARY ANGIOPLASTY WITH STENT PLACEMENT     TOTAL ABDOMINAL HYSTERECTOMY     Patient Active Problem List   Diagnosis Date Noted   CAD (coronary artery disease) 09/04/2013   HTN (hypertension) 09/04/2013   HLD (hyperlipidemia) 09/04/2013   Tobacco abuse 09/04/2013    ONSET DATE: 08/05/2022  REFERRING DIAG: G37.3 (ICD-10-CM) - Acute transverse myelitis in demyelinating disease of central nervous system  THERAPY DIAG:  Muscle weakness (generalized)  Unsteadiness on feet  Other abnormalities of  gait and mobility  Abnormal posture  Rationale for Evaluation and Treatment: Rehabilitation  SUBJECTIVE:                                                                                                                                                                                             SUBJECTIVE STATEMENT: Pt reports  no falls or acute changes since last visit. Pt reports exercises at home are going "slowly", has to find time to work on them. Pt saw her neurologist at [facility] last Friday and says they noticed an improvement in her overall function and movement. Pt's legs were sore for 2 days after last visit using SciFit. Pt and her husband interested in returning to the gym with [employer].  Pt accompanied by: self and significant other husband [name]  PERTINENT HISTORY: CAD, hyperlipidemia, hypertension, transverse myelitis  PAIN:  Are you having pain? No  PRECAUTIONS: Fall -Pt hears best out of LEFT ear (hard of hearing)  PATIENT GOALS: "be able to get up and walk around and do everything I used to do"  TODAY'S TREATMENT:                                                                                                                         NMR  In // bars with one UE to BUE support: Sidesteps over foam beam 3 x 10 reps L/R, circumduction with RLE Sidesteps on foam beam 2 x 10 ft L/R with focus on lifting RLE rather than scooting limb In therapy gym with no UE support and min A for balance: Alt L/R gumdrop taps with gait  Gait: Gait pattern: decreased hip/knee flexion- Right, decreased hip/knee flexion- Left, and shuffling Distance walked: 460 ft Assistive device utilized: Single point cane Level of assistance: Modified independence Comments: 4/10 RPE, attempted to have pt ambulate x 500 ft for LTG assessment, pt needs a seated rest break after 460 ft; pt exhibits decreased gait speed as task progresses and exhibits some inattention to her R-side environment due to head  turn to the L   PATIENT EDUCATION: Education details: Continue HEP, PT POC with plan to d/c next session Person educated: Patient and Spouse Education method: Customer service manager Education comprehension: verbalized understanding, returned demonstration, and needs further education  HOME EXERCISE PROGRAM: Access Code: [email or web address] URL: [email or web address] Date: 08/18/2022 Prepared by: Elease Etienne  Exercises - Seated Hamstring Stretch  - 1 x daily - 4-5 x weekly - 1 sets - 2 reps - 45 seconds hold - Seated Single Knee to Chest  - 1 x daily - 4-5 x weekly - 1 sets - 2 reps - 20-30 seconds hold - Gastroc Stretch on Wall  - 1 x daily - 5 x weekly - 1 sets - 1-2 reps - 45 seconds hold - Standing Hip Extension with Counter Support  - 1 x daily - 5 x weekly - 2 sets - 10 reps - Seated Upper Trapezius Stretch  - 1 x daily - 7 x weekly - 1 sets - 5 reps - 30 sec hold - Seated Cervical Rotation AROM  - 1 x daily - 7 x weekly - 1 sets - 5 reps - 30 sec hold - Sit to Stand with Armchair  - 1 x daily - 7 x weekly - 3 sets - 10 reps -  Mini Squat with Counter Support  - 1 x daily - 7 x weekly - 3 sets - 10 reps - Seated Hamstring Curls with Resistance  - 1 x daily - 4 x weekly - 2 sets - 10 reps - Side Stepping with Resistance at Ankles and Counter Support  - 1 x daily - 7 x weekly - 3 sets - 10 reps  Walking Program: Walk with your family.  Starting this week, walk 5 min per day for 7 days. Following week add 5 minutes to your total time. Week 1: 5 min Week 2: 10 min Week 3: 15 min Week 4: 20 min...Marland KitchenMarland KitchenUntil you can walk to 30 min per day    GOALS: Goals reviewed with patient? Yes  SHORT TERM GOALS: Target date: 09/06/2022   Pt will be independent with initial HEP for improved strength, balance, transfers and gait. Baseline: Goal status: MET  2.  Pt will improve 5 x STS to less than or equal to 20 seconds to demonstrate improved functional strength and transfer  efficiency.  Baseline: 25.43 sec (1/9), 24.97 sec (2/5) Goal status: IN PROGRESS  3.  Pt will improve gait velocity to at least 2.5 ft/sec for improved gait efficiency and performance at Supervision level  Baseline: 2.2 ft/sec with SPC and CGA (1/9), 1.64 ft/sec with SPC at mod I level (2/5) Goal status: IN PROGRESS  4.  Pt will improve her cervical ROM of R rotation and R lateral flexion >/=10 degrees to demonstrate improved function Baseline: 40 degrees R rotation, 5 degrees R lateral flexion (1/9), 55 degrees R rotation, 15 degrees R lateral flexion (2/5) Goal status: MET  5.  Pt will be able to ambulate x 150 ft with LRAD at Supervision level Baseline: 35 ft with SPC and CGA (1/9), 200 ft with SPC at mod I level (2/5) Goal status: MET   LONG TERM GOALS: Target date: 10/04/2022   Pt will be independent with final HEP for improved strength, balance, transfers and gait. Baseline:  Goal status: INITIAL  2.  Pt will improve 5 x STS to less than or equal to 20 seconds to demonstrate improved functional strength and transfer efficiency.  Baseline: 25.43 sec (1/9), 24.97 sec (2/5) Goal status: REVISED  3.  Pt will improve gait velocity to at least 2.5 ft/sec for improved gait efficiency and performance at mod I level  Baseline: 2.2 ft/sec with [facility] and CGA (1/9), 1.64 ft/sec with SPC at mod I level (2/5) Goal status: REVISED  4.  Pt will be able to ambulate x 500 ft with LRAD at mod I level Baseline: 35 ft with SPC and CGA (1/9), 200 ft with SPC at mod I level (2/5), 460 ft with SPC mod I (3/4) Goal status: IN PROGRESS  5.  Pt will improve normal TUG to less than or equal to 15 seconds for improved functional mobility and decreased fall risk. Baseline: 17.4 sec with SPC (1/9) Goal status: INITIAL  ASSESSMENT:  CLINICAL IMPRESSION: Emphasis of skilled PT session on attempting to initiate assessment of LTG in preparation for d/c from OPPT next visit, continuing to work on standing  balance and SLS stability, and discussing PT POC. Pt was able to ambulate x 460 ft before onset of fatigue and needing a seated rest break, did not quite meet goal of 500 ft yet. Also discussed pt's return to community fitness center (gym) upon d/c from PT services and encouraged her to continue to work on a seated exercise bike similar  to the SciFit or NuStep and to keep her RPE at 5-6/10 to see benefit for aerobic endurance. Pt does continue to exhibit some balance impairments with decreased standing balance in SLS without UE support. Continue POC.    OBJECTIVE IMPAIRMENTS: Abnormal gait, decreased balance, decreased endurance, decreased mobility, difficulty walking, decreased ROM, decreased strength, impaired perceived functional ability, impaired flexibility, impaired UE functional use, improper body mechanics, postural dysfunction, and pain.   ACTIVITY LIMITATIONS: carrying, lifting, bending, squatting, stairs, transfers, and bed mobility  PARTICIPATION LIMITATIONS: driving and community activity  PERSONAL FACTORS: Age, Time since onset of injury/illness/exacerbation, and 1-2 comorbidities:    CAD, hyperlipidemia, hypertensionare also affecting patient's functional outcome.   REHAB POTENTIAL: Good  CLINICAL DECISION MAKING: Stable/uncomplicated  EVALUATION COMPLEXITY: Low  PLAN:  PT FREQUENCY: 2x/week  PT DURATION: 8 weeks  PLANNED INTERVENTIONS: Therapeutic exercises, Therapeutic activity, Neuromuscular re-education, Balance training, Gait training, Patient/Family education, Self Care, Joint mobilization, Stair training, Vestibular training, Canalith repositioning, Visual/preceptual remediation/compensation, Orthotic/Fit training, DME instructions, Aquatic Therapy, Dry Needling, Electrical stimulation, Spinal mobilization, Cryotherapy, Moist heat, Taping, Manual therapy, and Re-evaluation  PLAN FOR NEXT SESSION: assess LTG and d/c from PT!   Excell Seltzer, PT, DPT, CSRS 10/03/2022,  10:12 AM

## 2022-10-03 NOTE — Therapy (Signed)
OUTPATIENT OCCUPATIONAL THERAPY NEURO TREATMENT  Patient Name: Kim Sutton MRN: [id number] DOB:21-Aug-1933, 89 y.o., female Today's Date: 10/03/2022  PCP: Dr. Louis Matte REFERRING PROVIDER: Kem Boroughs, MD  END OF SESSION:  OT End of Session - 10/03/22 0850     Visit Number 13    Number of Visits 17    Date for OT Re-Evaluation 10/08/22    Authorization Type MCR, [employer] - covered 100%    Authorization - Number of Visits 12    Progress Note Due on Visit 35    OT Start Time 0847    OT Stop Time 0930    OT Time Calculation (min) 43 min    Activity Tolerance Patient tolerated treatment well    Behavior During Therapy [facility] for tasks assessed/performed                  Past Medical History:  Diagnosis Date   Adrenal cortical adenoma of left adrenal gland    stable since 2009;  MRI in 04/2010 - no further scans   CAD (coronary artery disease)    a. s/p NSTEMI in 2001 tx with BMS to RCA   Colon polyps    HLD (hyperlipidemia)    Hx of cardiovascular stress test    Lexiscan Myoview (09/2013):  No ischemia, EF 77%, Low Risk.   Hyperglycemia    Hypertension    Myoclonus    on neurontin per neuro at [facility]   Nephrolithiasis    Nodule of right lung    a. 55m on CXR 10/2009;  CT 05/2010 ok - no further scans   Tobacco abuse    Past Surgical History:  Procedure Laterality Date   CHOLECYSTECTOMY     CORONARY ANGIOPLASTY WITH STENT PLACEMENT     TOTAL ABDOMINAL HYSTERECTOMY     Patient Active Problem List   Diagnosis Date Noted   CAD (coronary artery disease) 09/04/2013   HTN (hypertension) 09/04/2013   HLD (hyperlipidemia) 09/04/2013   Tobacco abuse 09/04/2013    ONSET DATE: 08/05/2022  REFERRING DIAG: G37.3 (ICD-10-CM) - Acute transverse myelitis in demyelinating disease of central nervous system  (History of cervical transverse myelitis resulting in right sided weakness; more weakness on right side now which is likely due to debility and aging and right shoulder and  wrist pain after falls (has been evaluated in ER)  Needs strengthening, ROM, endurance, appropriate use of cane)  THERAPY DIAG:  Muscle weakness (generalized)  Unsteadiness on feet  Abnormal posture  Other lack of coordination  Other symptoms and signs involving the nervous system  Rationale for Evaluation and Treatment: Rehabilitation  SUBJECTIVE:   SUBJECTIVE STATEMENT: My legs hurt this weekend. I think the SciFit did me in. No recent falls  Pt accompanied by: significant other (husband)   PERTINENT HISTORY: Ms. MEnwrightis an 89year old right-handed female with a history of hypertension, hyperlipidemia, coronary artery disease status post stent and cervical transverse myelitis presenting to clinic with chief concern of history of transverse myelitis and multiple falls.  PRECAUTIONS: Fall and Other: stent   HARD OF HEARING  WEIGHT BEARING RESTRICTIONS: No  PAIN:  PAIN:  Are you having pain? Yes: NPRS scale: 7/10 today - both legs Pain location: Lt neck, 0/10 Rt shoulder/upper traps w/ sh flexion at times Pain description: sore Aggravating factors: exercise Relieving factors: rest, tylenol    FALLS: Has patient fallen in last 6 months? Yes. Number of falls 2  LIVING ENVIRONMENT: Lives with: lives with their spouse Pt lives  in 2 story house, but lives on first floor with 6 steps to enter from garage, railing on [place] going up Has following equipment at home: Single point cane, Walker - 2 wheeled, Grab bars, and rollator, built in shower seat, grab bars in shower and toilet area  PLOF:  occasional min assist for BADLS in the last year  PATIENT GOALS: increase RUE/hand use  OBJECTIVE:   HAND DOMINANCE: Right   UPPER EXTREMITY ROM:    LUE AROM WNL's  RUE: shoulder flex approx 50*, abduction 45*, ER approx 50%, IR 75%; elbow, forearm and wrist WFL's. Rt hand movements slightly weaker but can do all intrinsic movement with difficulty  HAND FUNCTION: Grip strength:  Right: 15.2 lbs; Left: 39.9 lbs  COORDINATION: 9 Hole Peg test: Right: 43.45 sec; Left: 47.67 sec    TODAY'S TREATMENT:                                                                                                                               Began checking progress towards goals in prep for d/c next session - see goal section below.   Reviewed cane HEP - instructed to do shoulder abduction no higher than 90* RUE, but also do in high range LUE to correct trunk and neck position.  Pt issued updated HEP for neck and trunk ex and putty for grip strength Rt hand - see pt instructions for details.   Also made recommendations for increased use of Rt dominant hand for eating including: adapted utensils  PATIENT EDUCATION: Education details:  see above Person educated: Patient and Spouse Education method: Explanation, Demonstration, Tactile cues, and Verbal cues Education comprehension: verbalized understanding, returned demonstration, and verbal cues required  HOME EXERCISE PROGRAM: 08/18/22: Yellow putty HEP and coordination HEP 08/22/22:  Cane HEP 10/03/22: HEP for neck, trunk, putty   GOALS: Goals reviewed with patient? Yes  SHORT TERM GOALS: Target date: 09/09/22  Independent with HEP for Rt shoulder ROM, Rt grip strength, and bilateral coordination Goal status: MET  2.  Pt to verbalize understanding with A/E to increase ease with ADLS (LH brush, button hook, etc)  Goal status: MET  3.  Pt to eat 50% with Rt dominant hand and brush teeth 25% with Rt hand Goal status: PARTIALLY MET (performing brushing w/ dentures, only 25% eating with Rt hand)   4.  Pt to consistently doff pull over shirt I'ly Goal status: MET   LONG TERM GOALS: Target date: 10/08/22  Independent with updated HEP PRN Goal status: MET on 10/03/22  2.  Pt to eat 75% with Rt hand, and perform grooming 50% with Rt hand using A/E prn for brushing hair Baseline:  Goal status: NOT MET (10/03/22: 30-50%)   3.  Pt to improve bilateral coordination by 5 sec on 9 hole peg test  Baseline: Rt = 43.45 sec, Lt = 47.67 sec Goal status: INITIAL  4.  Pt to improve Rt hand grip strength by 5 lbs  Baseline: 15.2 lbs Goal status: NOT MET (10/03/22: 16.7 LBS)   5.  Pt to improve RUE sh flexion to 70* for greater ease with dressing, grooming and low to mid level reaching Baseline:  Goal status: MET on  10/03/22   ASSESSMENT:  CLINICAL IMPRESSION: Pt overall has improved in posture, especially when patient is actively thinking about it and engaging core. Neck pain has also improved. Pt remains limited in RUE ROM   PERFORMANCE DEFICITS: in functional skills including ADLs, IADLs, coordination, dexterity, edema, ROM, strength, pain, flexibility, Fine motor control, Gross motor control, mobility, body mechanics, endurance, decreased knowledge of use of DME, and UE functional use.   IMPAIRMENTS: are limiting patient from ADLs, IADLs, and leisure.   CO-MORBIDITIES: may have co-morbidities  that affects occupational performance. Patient will benefit from skilled OT to address above impairments and improve overall function.  MODIFICATION OR ASSISTANCE TO COMPLETE EVALUATION: No modification of tasks or assist necessary to complete an evaluation.  OT OCCUPATIONAL PROFILE AND HISTORY: Problem focused assessment: Including review of records relating to presenting problem.  CLINICAL DECISION MAKING: Moderate - several treatment options, min-mod task modification necessary  REHAB POTENTIAL: Fair time since onset and gradual decline over last few years  EVALUATION COMPLEXITY: Low   PLAN:  OT FREQUENCY: 2x/week  OT DURATION: 8 weeks, plus evaluation  PLANNED INTERVENTIONS: self care/ADL training, therapeutic exercise, therapeutic activity, neuromuscular re-education, manual therapy, passive range of motion, functional mobility training, splinting, paraffin, fluidotherapy, moist heat, patient/family education, coping strategies training, and DME and/or AE instructions  RECOMMENDED OTHER SERVICES: none at this time  CONSULTED AND AGREED WITH PLAN OF CARE: Patient and family member/caregiver  PLAN FOR NEXT SESSION:  check remaining goal and d/c next session  Hans Eden, OTR/L 10/03/2022, 1:00 PM

## 2022-10-03 NOTE — Patient Instructions (Signed)
  1. Grip Strengthening (Resistive Putty)   Squeeze putty using thumb and all fingers. Repeat _20___ times. Do __2__ sessions per day.   Lateral Flexion    With head in comfortable, centered position and chin slightly tucked, gently bring right ear toward right shoulder. Hold _10___ seconds. Repeat __5__ times. Do __6__ sessions per day.  SITTING: Lateral Weight Shift    Shift weight to left side, straighten arm. Raise opposite hip from surface. Hold _5__ seconds. _10__ reps per set, _6__ sets per day

## 2022-10-06 ENCOUNTER — Encounter: Payer: Self-pay | Admitting: Physical Therapy

## 2022-10-06 ENCOUNTER — Encounter: Payer: Self-pay | Admitting: Occupational Therapy

## 2022-10-06 ENCOUNTER — Ambulatory Visit: Payer: Medicare Other | Admitting: Occupational Therapy

## 2022-10-06 ENCOUNTER — Ambulatory Visit: Payer: Medicare Other | Admitting: Physical Therapy

## 2022-10-06 DIAGNOSIS — R293 Abnormal posture: Secondary | ICD-10-CM

## 2022-10-06 DIAGNOSIS — R2689 Other abnormalities of gait and mobility: Secondary | ICD-10-CM | POA: Diagnosis not present

## 2022-10-06 DIAGNOSIS — M6281 Muscle weakness (generalized): Secondary | ICD-10-CM

## 2022-10-06 DIAGNOSIS — R278 Other lack of coordination: Secondary | ICD-10-CM | POA: Diagnosis not present

## 2022-10-06 DIAGNOSIS — R29818 Other symptoms and signs involving the nervous system: Secondary | ICD-10-CM | POA: Diagnosis not present

## 2022-10-06 DIAGNOSIS — R2681 Unsteadiness on feet: Secondary | ICD-10-CM

## 2022-10-06 NOTE — Therapy (Signed)
OUTPATIENT OCCUPATIONAL THERAPY NEURO TREATMENT  Patient Name: Kim Sutton MRN: [id number]:[id number] DOB:June 29, 1934, 89 y.o., female Today's Date: 10/06/2022  PCP: Dr. Louis Sutton REFERRING PROVIDER: Kem Boroughs, MD  END OF SESSION:  OT End of Session - 10/06/22 0856     Visit Number 14    Number of Visits 17    Date for OT Re-Evaluation 10/08/22    Authorization Type MCR, AARP - covered 100%    Authorization - Number of Visits 13    Progress Note Due on Visit 27    OT Start Time 0845    OT Stop Time [id number]    OT Time Calculation (min) 35 min    Activity Tolerance Patient tolerated treatment well    Behavior During Therapy [facility] for tasks assessed/performed                  Past Medical History:  Diagnosis Date   Adrenal cortical adenoma of left adrenal gland    stable since 2009;  MRI in 04/2010 - no further scans   CAD (coronary artery disease)    a. s/p NSTEMI in 2001 tx with BMS to RCA   Colon polyps    HLD (hyperlipidemia)    Hx of cardiovascular stress test    Lexiscan Myoview (09/2013):  No ischemia, EF 77%, Low Risk.   Hyperglycemia    Hypertension    Myoclonus    on neurontin per neuro at [facility]   Nephrolithiasis    Nodule of right lung    a. 9m on CXR 10/2009;  CT 05/2010 ok - no further scans   Tobacco abuse    Past Surgical History:  Procedure Laterality Date   CHOLECYSTECTOMY     CORONARY ANGIOPLASTY WITH STENT PLACEMENT     TOTAL ABDOMINAL HYSTERECTOMY     Patient Active Problem List   Diagnosis Date Noted   CAD (coronary artery disease) 09/04/2013   HTN (hypertension) 09/04/2013   HLD (hyperlipidemia) 09/04/2013   Tobacco abuse 09/04/2013    ONSET DATE: 08/05/2022  REFERRING DIAG: G37.3 (ICD-10-CM) - Acute transverse myelitis in demyelinating disease of central nervous system  (History of cervical transverse myelitis resulting in right sided weakness; more weakness on right side now which is likely due to debility and aging and right shoulder and  wrist pain after falls (has been evaluated in ER)  Needs strengthening, ROM, endurance, appropriate use of cane)  THERAPY DIAG:  Muscle weakness (generalized)  Abnormal posture  Other lack of coordination  Rationale for Evaluation and Treatment: Rehabilitation  SUBJECTIVE:   SUBJECTIVE STATEMENT: My legs hurt this weekend. I think the SciFit did me in. No recent falls  Pt accompanied by: significant other (husband)   PERTINENT HISTORY: [name] an 89year old right-handed female with a history of hypertension, hyperlipidemia, coronary artery disease status post stent and cervical transverse myelitis presenting to clinic with chief concern of history of transverse myelitis and multiple falls.  PRECAUTIONS: Fall and Other: stent   HARD OF HEARING  WEIGHT BEARING RESTRICTIONS: No  PAIN:  PAIN:  Are you having pain? Yes: NPRS scale: 7/10 today - both legs Pain location: Lt neck, 0/10 Rt shoulder/upper traps w/ sh flexion at times Pain description: sore Aggravating factors: exercise Relieving factors: rest, tylenol    FALLS: Has patient fallen in last 6 months? Yes. Number of falls 2  LIVING ENVIRONMENT: Lives with: lives with their spouse Pt lives in 2 story house, but lives on first floor with 6 steps to  enter from garage, railing on [place] going up Has following equipment at home: Single point cane, Walker - 2 wheeled, Grab bars, and rollator, built in shower seat, grab bars in shower and toilet area  PLOF:  occasional min assist for BADLS in the last year  PATIENT GOALS: increase RUE/hand use  OBJECTIVE:   HAND DOMINANCE: Right   UPPER EXTREMITY ROM:    LUE AROM WNL's  RUE: shoulder flex approx 50*, abduction 45*, ER approx 50%, IR 75%; elbow, forearm and wrist WFL's. Rt hand movements slightly weaker but can do all intrinsic movement with difficulty  HAND FUNCTION: Grip strength: Right: 15.2 lbs; Left: 39.9 lbs  COORDINATION: 9 Hole Peg test: Right: 43.45  sec; Left: 47.67 sec    TODAY'S TREATMENT:                                                                                                                               Reviewed HEP's including cane and postural HEP.  Practiced wt shifts and Rt lateral trunk flexion in sturdy chair (in prep for home).  Pt instructed to do at home in sturdy chair vs. recliner  Assessed 9 hole peg test (see below) - pt with improvements on Lt side but did not meet goal  Reviewed progress to date and recommendations to continue trunk, neck, shoulder and coordination HEP's  PATIENT EDUCATION: Education details:  see above Person educated: Patient and Spouse Education method: Explanation, Demonstration, Tactile cues, and Verbal cues Education comprehension: verbalized understanding, returned demonstration, and verbal cues required  HOME EXERCISE PROGRAM: 08/18/22: Yellow putty HEP and coordination HEP 08/22/22:  Cane HEP 10/03/22: HEP for neck, trunk, putty   GOALS: Goals reviewed with patient? Yes  SHORT TERM GOALS: Target date: 09/09/22  Independent with HEP for Rt shoulder ROM, Rt grip strength, and bilateral coordination Goal status: MET  2.  Pt to verbalize understanding with A/E to increase ease with ADLS (LH brush, button hook, etc)  Goal status: MET  3.  Pt to eat 50% with Rt dominant hand and brush teeth 25% with Rt hand Goal status: PARTIALLY MET (performing brushing w/ dentures, only 25% eating with Rt hand)   4.  Pt to consistently doff pull over shirt I'ly Goal status: MET   LONG TERM GOALS: Target date: 10/08/22  Independent with updated HEP PRN Goal status: MET on 10/03/22  2.  Pt to eat 75% with Rt hand, and perform grooming 50% with Rt hand using A/E prn for brushing hair Baseline:  Goal status: NOT MET (10/03/22: 30-50%)   3.  Pt to improve bilateral coordination by 5 sec on 9 hole peg test  Baseline: Rt = 43.45 sec, Lt = 47.67 sec Goal status: NOT MET (Rt = 43 sec., Lt = 43 sec)   4.  Pt to improve Rt hand grip strength by 5 lbs  Baseline: 15.2 lbs Goal status: NOT MET (10/03/22: 16.7 LBS)   5.  Pt to improve RUE sh flexion to 70* for greater ease with dressing, grooming and low to mid level reaching Baseline:  Goal status: MET on 10/03/22   ASSESSMENT:  CLINICAL IMPRESSION: Pt has met all STG's and 2/5 LTG's. Pt has improved overall with pain and trunk control, but  remains limited in posture and RUE function   PERFORMANCE DEFICITS: in functional skills including ADLs, IADLs, coordination, dexterity, edema, ROM, strength, pain, flexibility, Fine motor control, Gross motor control, mobility, body mechanics, endurance, decreased knowledge of use of DME, and UE functional use.   IMPAIRMENTS: are limiting patient from ADLs, IADLs, and leisure.   CO-MORBIDITIES: may have co-morbidities  that affects occupational performance. Patient will benefit from skilled OT to address above impairments and improve overall function.  MODIFICATION OR ASSISTANCE TO COMPLETE EVALUATION: No modification of tasks or assist necessary to complete an evaluation.  OT OCCUPATIONAL PROFILE AND HISTORY: Problem focused assessment: Including review of records relating to presenting problem.  CLINICAL DECISION MAKING: Moderate - several treatment options, min-mod task modification necessary  REHAB POTENTIAL: Fair time since onset and gradual decline over last few years  EVALUATION COMPLEXITY: Low   PLAN:  OT FREQUENCY: 2x/week  OT DURATION: 8 weeks, plus evaluation  PLANNED INTERVENTIONS: self care/ADL training, therapeutic exercise, therapeutic activity, neuromuscular re-education, manual therapy, passive range of motion, functional mobility training, splinting, paraffin, fluidotherapy, moist heat, patient/family education, coping strategies training, and DME and/or AE instructions  RECOMMENDED OTHER SERVICES: none at this time  CONSULTED AND AGREED WITH PLAN OF CARE: Patient and family member/caregiver  PLAN  D/C O.T.    OCCUPATIONAL THERAPY DISCHARGE SUMMARY  Visits from Start of Care: 14  Current functional level related to goals / functional outcomes: SEE ABOVE   Remaining deficits: Head and trunk posture RUE function Rt shoulder ROM Bilateral coordination   Education / Equipment: HEP's, postural control and positioning   Patient agrees  to discharge.  Patient goals were partially met. Patient is being discharged due to being pleased with the current functional level.Hans Eden, OTR/L 10/06/2022, 9:17 AM

## 2022-10-06 NOTE — Therapy (Signed)
OUTPATIENT PHYSICAL THERAPY NEURO TREATMENT/DISCHARGE SUMMARY   Patient Name: Kim Sutton MRN: [place]:[id number] DOB:Nov 22, 1933, 89 y.o., female Today's Date: 10/06/2022   PCP: Lajean Manes, MD (retired) REFERRING PROVIDER: Kem Boroughs, MD  PHYSICAL THERAPY DISCHARGE SUMMARY  Visits from Start of Care: 15  Current functional level related to goals / functional outcomes: See clinical impression statement   Remaining deficits: Chronic gait deviations w/ reliance on [facility] for safety   Education / Equipment: Discharge plan and progress towards goals.  Encouraged pt to return to community activity environment like gym or silver sneakers.  Patient agrees to discharge. Patient goals were partially met. Patient is being discharged due to maximized rehab potential.    END OF SESSION:  PT End of Session - 10/06/22 0922     Visit Number 15    Number of Visits 17    Date for PT Re-Evaluation 11/01/22    Authorization Type Medicare    Progress Note Due on Visit 10    PT Start Time 0920    PT Stop Time 0950    PT Time Calculation (min) 30 min    Equipment Utilized During Treatment Gait belt    Activity Tolerance Patient tolerated treatment well;Other (comment)   hard of hearing, repetition needed   Behavior During Therapy [facility] for tasks assessed/performed                     Past Medical History:  Diagnosis Date   Adrenal cortical adenoma of left adrenal gland    stable since 2009;  MRI in 04/2010 - no further scans   CAD (coronary artery disease)    a. s/p NSTEMI in 2001 tx with BMS to RCA   Colon polyps    HLD (hyperlipidemia)    Hx of cardiovascular stress test    Lexiscan Myoview (09/2013):  No ischemia, EF 77%, Low Risk.   Hyperglycemia    Hypertension    Myoclonus    on neurontin per neuro at [facility]   Nephrolithiasis    Nodule of right lung    a. 57m on CXR 10/2009;  CT 05/2010 ok - no further scans   Tobacco abuse    Past Surgical History:  Procedure  Laterality Date   CHOLECYSTECTOMY     CORONARY ANGIOPLASTY WITH STENT PLACEMENT     TOTAL ABDOMINAL HYSTERECTOMY     Patient Active Problem List   Diagnosis Date Noted   CAD (coronary artery disease) 09/04/2013   HTN (hypertension) 09/04/2013   HLD (hyperlipidemia) 09/04/2013   Tobacco abuse 09/04/2013    ONSET DATE: 08/05/2022  REFERRING DIAG: G37.3 (ICD-10-CM) - Acute transverse myelitis in demyelinating disease of central nervous system  THERAPY DIAG:  Muscle weakness (generalized)  Abnormal posture  Other lack of coordination  Unsteadiness on feet  Rationale for Evaluation and Treatment: Rehabilitation  SUBJECTIVE:  SUBJECTIVE STATEMENT: Pt reports no falls or acute changes since last visit. Pt states she is trying to work on her home exercises, husband states possibly not enough time, but they put their time in.  Pt not as interested in [place] at this time, but may return to a gym.  Pt accompanied by: self and significant other husband Kim Sutton  PERTINENT HISTORY: CAD, hyperlipidemia, hypertension, transverse myelitis  PAIN:  Are you having pain? No  PRECAUTIONS: Fall -Pt hears best out of LEFT ear (hard of hearing)  PATIENT GOALS: "be able to get up and walk around and do everything I used to do"  TODAY'S TREATMENT:                                                                                                                         THERACT: -Verbally reviewed HEP w/ pt, stating no physical review needed, does not need reprint. -5xSTS: 27.09 seconds w/ light BUE support - 10MWT:  13.63 sec = 0.73 m/sec OR 2.42 ft/sec w/ SPC modI -TUG w/ SPC:  15.63 sec (trial 1), 17.44 sec (trial 2), 14.00 sec (trial 3) = 15.69 sec (average) -410' w/ SPC at modI level continuously prior  to fatigue  PATIENT EDUCATION: Education details: Discharge plan.  Progress towards goals. Person educated: Patient and Spouse Education method: Customer service manager Education comprehension: verbalized understanding, returned demonstration, and needs further education  HOME EXERCISE PROGRAM: Access Code: [email or web address] URL: [email or web address] Date: 08/18/2022 Prepared by: Elease Etienne  Exercises - Seated Hamstring Stretch  - 1 x daily - 4-5 x weekly - 1 sets - 2 reps - 45 seconds hold - Seated Single Knee to Chest  - 1 x daily - 4-5 x weekly - 1 sets - 2 reps - 20-30 seconds hold - Gastroc Stretch on Wall  - 1 x daily - 5 x weekly - 1 sets - 1-2 reps - 45 seconds hold - Standing Hip Extension with Counter Support  - 1 x daily - 5 x weekly - 2 sets - 10 reps - Seated Upper Trapezius Stretch  - 1 x daily - 7 x weekly - 1 sets - 5 reps - 30 sec hold - Seated Cervical Rotation AROM  - 1 x daily - 7 x weekly - 1 sets - 5 reps - 30 sec hold - Sit to Stand with Armchair  - 1 x daily - 7 x weekly - 3 sets - 10 reps - Mini Squat with Counter Support  - 1 x daily - 7 x weekly - 3 sets - 10 reps - Seated Hamstring Curls with Resistance  - 1 x daily - 4 x weekly - 2 sets - 10 reps - Side Stepping with Resistance at Ankles and Counter Support  - 1 x daily - 7 x weekly - 3 sets - 10 reps  Walking Program: Walk with your family.  Starting this week, walk 5 min per day for 7 days. Following week add 5  minutes to your total time. Week 1: 5 min Week 2: 10 min Week 3: 15 min Week 4: 20 min...Marland KitchenMarland KitchenUntil you can walk to 30 min per day    GOALS: Goals reviewed with patient? Yes  SHORT TERM GOALS: Target date: 09/06/2022   Pt will be independent with initial HEP for improved strength, balance, transfers and gait. Baseline: Goal status: MET  2.  Pt will improve 5 x STS to less than or equal to 20 seconds to demonstrate improved functional strength and transfer efficiency.   Baseline: 25.43 sec (1/9), 24.97 sec (2/5) Goal status: IN PROGRESS  3.  Pt will improve gait velocity to at least 2.5 ft/sec for improved gait efficiency and performance at Supervision level  Baseline: 2.2 ft/sec with [facility] and CGA (1/9), 1.64 ft/sec with SPC at mod I level (2/5);  Goal status: IN PROGRESS  4.  Pt will improve her cervical ROM of R rotation and R lateral flexion >/=10 degrees to demonstrate improved function Baseline: 40 degrees R rotation, 5 degrees R lateral flexion (1/9), 55 degrees R rotation, 15 degrees R lateral flexion (2/5) Goal status: MET  5.  Pt will be able to ambulate x 150 ft with LRAD at Supervision level Baseline: 35 ft with SPC and CGA (1/9), 200 ft with SPC at mod I level (2/5) Goal status: MET   LONG TERM GOALS: Target date: 10/04/2022   Pt will be independent with final HEP for improved strength, balance, transfers and gait. Baseline: Pt intermittently compliant w/ husband's assistance. Goal status: PARTIALLY MET  2.  Pt will improve 5 x STS to less than or equal to 20 seconds to demonstrate improved functional strength and transfer efficiency.  Baseline: 25.43 sec (1/9), 24.97 sec (2/5); 27.09 sec (3/7) Goal status: NOT MET  3.  Pt will improve gait velocity to at least 2.5 ft/sec for improved gait efficiency and performance at mod I level  Baseline: 2.2 ft/sec with [facility] and CGA (1/9), 1.64 ft/sec with SPC at mod I level (2/5); 2.42 ft/sec w/ SPC at modI level (3/7) Goal status: PARTIALLY MET  4.  Pt will be able to ambulate x 500 ft with LRAD at mod I level Baseline: 35 ft with [facility] and CGA (1/9), 200 ft with SPC at mod I level (2/5), 460 ft with SPC mod I (3/4); 410' w/ SPC modI (3/7) Goal status: PARTIALLY MET  5.  Pt will improve normal TUG to less than or equal to 15 seconds for improved functional mobility and decreased fall risk. Baseline: 17.4 sec with SPC (1/9); 15.69 sec w/ SPC (3/7) Goal status: PARTIALLY MET  ASSESSMENT:  CLINICAL  IMPRESSION: Assessed LTGs this session with patient partially meeting 4 of 5 goals and not meeting goal #2.  Her 5xSTS time was increased compared to her average at a time of 27.09 seconds this session demonstrating some plateau in LE strengthening.  Her gait velocity was greatly improved from prior assessment to 2.42 ft/sec with use of the SPC at modified independent level.  She was able to ambulate a distance of 410 feet with use of her SPC which is compared to most recent prior session distance of 460 feet, but significantly improved from assessment one month ago of 200 feet.  She progressed from 17.40 seconds to 15.69 seconds during TUG assessment today.  She remains safest and most steady with use of the cane and her and her husband have plans to rejoin a community gym or activities program.  At this  time she is appropriate for and agreeable to discharge to self-management at home.   OBJECTIVE IMPAIRMENTS: Abnormal gait, decreased balance, decreased endurance, decreased mobility, difficulty walking, decreased ROM, decreased strength, impaired perceived functional ability, impaired flexibility, impaired UE functional use, improper body mechanics, postural dysfunction, and pain.   ACTIVITY LIMITATIONS: carrying, lifting, bending, squatting, stairs, transfers, and bed mobility  PARTICIPATION LIMITATIONS: driving and community activity  PERSONAL FACTORS: Age, Time since onset of injury/illness/exacerbation, and 1-2 comorbidities:    CAD, hyperlipidemia, hypertensionare also affecting patient's functional outcome.   REHAB POTENTIAL: Good  CLINICAL DECISION MAKING: Stable/uncomplicated  EVALUATION COMPLEXITY: Low  PLAN:  PT FREQUENCY: 2x/week  PT DURATION: 8 weeks  PLANNED INTERVENTIONS: Therapeutic exercises, Therapeutic activity, Neuromuscular re-education, Balance training, Gait training, Patient/Family education, Self Care, Joint mobilization, Stair training, Vestibular training, Canalith  repositioning, Visual/preceptual remediation/compensation, Orthotic/Fit training, DME instructions, Aquatic Therapy, Dry Needling, Electrical stimulation, Spinal mobilization, Cryotherapy, Moist heat, Taping, Manual therapy, and Re-evaluation  PLAN FOR NEXT SESSION: N/A   Bary Richard, PT, DPT 10/06/2022, 10:45 AM

## 2022-10-14 ENCOUNTER — Telehealth: Payer: Self-pay | Admitting: Cardiology

## 2022-10-14 NOTE — Telephone Encounter (Signed)
Patient's husband is returning call to discuss monitor results. ?

## 2022-10-17 NOTE — Telephone Encounter (Signed)
Kim Heinz, MD 10/09/2022  4:42 PM EDT     Few fast heart rhythms from top and bottom chambers of the heart but short duration and no symptoms reported.  Recent echo was unremarkable.  No further workup recommended at this time    Spoke to husband (ok per DPR)-aware of results.

## 2022-10-31 DIAGNOSIS — H02831 Dermatochalasis of right upper eyelid: Secondary | ICD-10-CM | POA: Diagnosis not present

## 2022-10-31 DIAGNOSIS — H04213 Epiphora due to excess lacrimation, bilateral lacrimal glands: Secondary | ICD-10-CM | POA: Diagnosis not present

## 2022-10-31 DIAGNOSIS — H04123 Dry eye syndrome of bilateral lacrimal glands: Secondary | ICD-10-CM | POA: Diagnosis not present

## 2022-10-31 DIAGNOSIS — H02834 Dermatochalasis of left upper eyelid: Secondary | ICD-10-CM | POA: Diagnosis not present

## 2022-11-07 ENCOUNTER — Ambulatory Visit (INDEPENDENT_AMBULATORY_CARE_PROVIDER_SITE_OTHER): Payer: Medicare Other | Admitting: Podiatry

## 2022-11-07 ENCOUNTER — Encounter: Payer: Self-pay | Admitting: Podiatry

## 2022-11-07 VITALS — BP 184/80 | HR 60 | Ht 66.0 in | Wt 173.0 lb

## 2022-11-07 DIAGNOSIS — M79675 Pain in left toe(s): Secondary | ICD-10-CM

## 2022-11-07 DIAGNOSIS — M79674 Pain in right toe(s): Secondary | ICD-10-CM | POA: Diagnosis not present

## 2022-11-07 DIAGNOSIS — B351 Tinea unguium: Secondary | ICD-10-CM

## 2022-11-07 DIAGNOSIS — D169 Benign neoplasm of bone and articular cartilage, unspecified: Secondary | ICD-10-CM

## 2022-11-08 NOTE — Progress Notes (Signed)
Subjective:   Patient ID: Kim Sutton, female   DOB: 89 y.o.   MRN: [id number]   HPI Patient states that she has problems with nail disease and also with toe pain.  She presents with caregiver who states he cannot take care of this for her.  Also gets irritation between digits and that can be tender at times and patient does not smoke is not active   Review of Systems  All other systems reviewed and are negative.       Objective:  Physical Exam Vitals and nursing note reviewed.  Constitutional:      Appearance: She is well-developed.  Pulmonary:     Effort: Pulmonary effort is normal.  Musculoskeletal:        General: Normal range of motion.  Skin:    General: Skin is warm.  Neurological:     Mental Status: She is alert.     Neurovascular status was found to be moderately diminished both PT DP pulses neurological mildly diminished.  Patient is found to have severe thickness of nailbeds 1-5 both feet that get incurvated and sore and also has small osteochondral lesions between the digits.  Good digital perfusion well-oriented     Assessment:  Chronic nail infection with pain 1-5 both feet along with bony lesion formation     Plan:  H&P reviewed both conditions and nail debridement 1-5 both feet with no angiogenic bleeding accomplished.  I then discussed the lesions we will get a try padding and shoe gear modifications do not recommend surgery unless symptoms were to get worse and it should be kept under control.  Patient will be seen back to recheck

## 2022-11-24 DIAGNOSIS — H353132 Nonexudative age-related macular degeneration, bilateral, intermediate dry stage: Secondary | ICD-10-CM | POA: Diagnosis not present

## 2022-11-24 DIAGNOSIS — Z961 Presence of intraocular lens: Secondary | ICD-10-CM | POA: Diagnosis not present

## 2022-11-24 DIAGNOSIS — H35372 Puckering of macula, left eye: Secondary | ICD-10-CM | POA: Diagnosis not present

## 2022-12-06 ENCOUNTER — Other Ambulatory Visit: Payer: Self-pay | Admitting: Internal Medicine

## 2022-12-06 ENCOUNTER — Ambulatory Visit
Admission: RE | Admit: 2022-12-06 | Discharge: 2022-12-06 | Disposition: A | Discharge status: Home / Self Care | Payer: Medicare Other | Source: Ambulatory Visit | Attending: Internal Medicine | Admitting: Internal Medicine

## 2022-12-06 DIAGNOSIS — R079 Chest pain, unspecified: Secondary | ICD-10-CM | POA: Diagnosis not present

## 2022-12-06 DIAGNOSIS — R0789 Other chest pain: Secondary | ICD-10-CM

## 2022-12-06 DIAGNOSIS — E7800 Pure hypercholesterolemia, unspecified: Secondary | ICD-10-CM | POA: Diagnosis not present

## 2022-12-06 DIAGNOSIS — R0781 Pleurodynia: Secondary | ICD-10-CM | POA: Diagnosis not present

## 2023-02-02 ENCOUNTER — Other Ambulatory Visit: Payer: Self-pay

## 2023-02-02 ENCOUNTER — Emergency Department (HOSPITAL_BASED_OUTPATIENT_CLINIC_OR_DEPARTMENT_OTHER): Payer: Medicare Other

## 2023-02-02 ENCOUNTER — Encounter (HOSPITAL_BASED_OUTPATIENT_CLINIC_OR_DEPARTMENT_OTHER): Payer: Self-pay

## 2023-02-02 ENCOUNTER — Emergency Department (HOSPITAL_BASED_OUTPATIENT_CLINIC_OR_DEPARTMENT_OTHER)
Admission: EM | Admit: 2023-02-02 | Discharge: 2023-02-02 | Disposition: A | Discharge status: Home / Self Care | Payer: Medicare Other | Attending: Emergency Medicine | Admitting: Emergency Medicine

## 2023-02-02 DIAGNOSIS — F17200 Nicotine dependence, unspecified, uncomplicated: Secondary | ICD-10-CM | POA: Diagnosis not present

## 2023-02-02 DIAGNOSIS — I2510 Atherosclerotic heart disease of native coronary artery without angina pectoris: Secondary | ICD-10-CM | POA: Insufficient documentation

## 2023-02-02 DIAGNOSIS — I10 Essential (primary) hypertension: Secondary | ICD-10-CM | POA: Diagnosis not present

## 2023-02-02 DIAGNOSIS — K5792 Diverticulitis of intestine, part unspecified, without perforation or abscess without bleeding: Secondary | ICD-10-CM | POA: Diagnosis not present

## 2023-02-02 DIAGNOSIS — K5732 Diverticulitis of large intestine without perforation or abscess without bleeding: Secondary | ICD-10-CM | POA: Diagnosis not present

## 2023-02-02 DIAGNOSIS — R1030 Lower abdominal pain, unspecified: Secondary | ICD-10-CM | POA: Diagnosis present

## 2023-02-02 DIAGNOSIS — R109 Unspecified abdominal pain: Secondary | ICD-10-CM | POA: Diagnosis not present

## 2023-02-02 LAB — CBC WITH DIFFERENTIAL/PLATELET
Abs Immature Granulocytes: 0.02 10*3/uL (ref 0.00–0.07)
Basophils Absolute: 0 10*3/uL (ref 0.0–0.1)
Basophils Relative: 0 %
Eosinophils Absolute: 0.1 10*3/uL (ref 0.0–0.5)
Eosinophils Relative: 1 %
HCT: 38.3 % (ref 36.0–46.0)
Hemoglobin: 13 g/dL (ref 12.0–15.0)
Immature Granulocytes: 0 %
Lymphocytes Relative: 11 %
Lymphs Abs: 1.1 10*3/uL (ref 0.7–4.0)
MCH: 31.7 pg (ref 26.0–34.0)
MCHC: 33.9 g/dL (ref 30.0–36.0)
MCV: 93.4 fL (ref 80.0–100.0)
Monocytes Absolute: 0.8 10*3/uL (ref 0.1–1.0)
Monocytes Relative: 9 %
Neutro Abs: 7.5 10*3/uL (ref 1.7–7.7)
Neutrophils Relative %: 79 %
Platelets: 167 10*3/uL (ref 150–400)
RBC: 4.1 MIL/uL (ref 3.87–5.11)
RDW: 13.4 % (ref 11.5–15.5)
WBC: 9.5 10*3/uL (ref 4.0–10.5)
nRBC: 0 % (ref 0.0–0.2)

## 2023-02-02 LAB — COMPREHENSIVE METABOLIC PANEL WITH GFR
ALT: 16 U/L (ref 0–44)
AST: 18 U/L (ref 15–41)
Albumin: 3.8 g/dL (ref 3.5–5.0)
Alkaline Phosphatase: 31 U/L — ABNORMAL LOW (ref 38–126)
Anion gap: 9 (ref 5–15)
BUN: 13 mg/dL (ref 8–23)
CO2: 28 mmol/L (ref 22–32)
Calcium: 9.1 mg/dL (ref 8.9–10.3)
Chloride: 100 mmol/L (ref 98–111)
Creatinine, Ser: 0.77 mg/dL (ref 0.44–1.00)
GFR, Estimated: >60 mL/min
Glucose, Bld: 129 mg/dL — ABNORMAL HIGH (ref 70–99)
Potassium: 3.7 mmol/L (ref 3.5–5.1)
Sodium: 137 mmol/L (ref 135–145)
Total Bilirubin: 1.1 mg/dL (ref 0.3–1.2)
Total Protein: 6.9 g/dL (ref 6.5–8.1)

## 2023-02-02 LAB — URINALYSIS, MICROSCOPIC (REFLEX): Bacteria, UA: NONE SEEN

## 2023-02-02 LAB — URINALYSIS, ROUTINE W REFLEX MICROSCOPIC
Bilirubin Urine: NEGATIVE
Glucose, UA: NEGATIVE mg/dL
Ketones, ur: NEGATIVE mg/dL
Leukocytes,Ua: NEGATIVE
Nitrite: NEGATIVE
Protein, ur: NEGATIVE mg/dL
Specific Gravity, Urine: 1.015 (ref 1.005–1.030)
pH: 8 (ref 5.0–8.0)

## 2023-02-02 LAB — LACTIC ACID, PLASMA: Lactic Acid, Venous: 1.3 mmol/L (ref 0.5–1.9)

## 2023-02-02 LAB — LIPASE, BLOOD: Lipase: 31 U/L (ref 11–51)

## 2023-02-02 MED ORDER — CIPROFLOXACIN HCL 500 MG PO TABS: 500.0000 mg | ORAL_TABLET | Freq: Two times a day (BID) | ORAL | 0 refills | Status: AC

## 2023-02-02 MED ORDER — IOHEXOL 300 MG/ML  SOLN
100.0000 mL | Freq: Once | INTRAMUSCULAR | Status: AC | PRN
  Administered 2023-02-02: 100 mL via INTRAVENOUS

## 2023-02-02 MED ORDER — METRONIDAZOLE 500 MG PO TABS: 500.0000 mg | ORAL_TABLET | Freq: Two times a day (BID) | ORAL | 0 refills | Status: AC

## 2023-02-02 NOTE — ED Triage Notes (Signed)
Pt c/o lower abdominal pain that started late last night but got worse this morning. Pt denies N/V/D.Pt denies any bleeding or discharge.

## 2023-02-02 NOTE — ED Provider Notes (Signed)
Emergency Department Provider Note   I have reviewed the triage vital signs and the nursing notes.   HISTORY  Chief Complaint Abdominal Pain   HPI Kim Sutton is a 89 y.o. female past history reviewed below presents to the emergency department with lower abdominal and back pain.  Symptoms began yesterday.  She had mild pain at first, more severe.  Describes a cramping type discomfort without dysuria or urine frequency.  No diarrhea, vomiting.  No pain into the chest.  No ripping/tearing pain.  Patient states years ago she recalls some similar discomfort but no clear diagnosis. Has taken tylenol with little relief in symptoms.   Past Medical History:  Diagnosis Date   Adrenal cortical adenoma of left adrenal gland    stable since 2009;  MRI in 04/2010 - no further scans   CAD (coronary artery disease)    a. s/p NSTEMI in 2001 tx with BMS to RCA   Colon polyps    HLD (hyperlipidemia)    Hx of cardiovascular stress test    Lexiscan Myoview (09/2013):  No ischemia, EF 77%, Low Risk.   Hyperglycemia    Hypertension    Myoclonus    on neurontin per neuro at [facility]   Nephrolithiasis    Nodule of right lung    a. 7mm on CXR 10/2009;  CT 05/2010 ok - no further scans   Tobacco abuse     Review of Systems  Constitutional: No fever/chills Cardiovascular: Denies chest pain. Respiratory: Denies shortness of breath. Gastrointestinal: Positive abdominal pain.  No nausea, no vomiting.  No diarrhea.  No constipation. Skin: Negative for rash. Neurological: Negative for headaches.   ____________________________________________   PHYSICAL EXAM:  VITAL SIGNS: ED Triage Vitals  Enc Vitals Group     BP 02/02/23 0423 (!) 201/69     Pulse Rate 02/02/23 0423 (!) 58     Resp 02/02/23 0423 18     Temp 02/02/23 0423 98.9 F (37.2 C)     Temp Source 02/02/23 0423 Oral     SpO2 02/02/23 0423 94 %     Weight 02/02/23 0426 160 lb (72.6 kg)     Height 02/02/23 0426 5\' 6"  (1.676 m)    Constitutional: Alert and oriented. Well appearing and in no acute distress. Eyes: Conjunctivae are normal.  Head: Atraumatic. Nose: No congestion/rhinnorhea. Mouth/Throat: Mucous membranes are moist.   Neck: No stridor. Cardiovascular: Normal rate, regular rhythm. Good peripheral circulation. Grossly normal heart sounds.   Respiratory: Normal respiratory effort.  No retractions. Lungs CTAB. Gastrointestinal: Soft with tenderness mainly in the LLQ without peritonitis. No distention.  Musculoskeletal: No lower extremity tenderness nor edema. No gross deformities of extremities. Neurologic:  Normal speech and language.  Skin:  Skin is warm, dry and intact. No rash noted.   ____________________________________________   LABS (all labs ordered are listed, but only abnormal results are displayed)  Labs Reviewed  COMPREHENSIVE METABOLIC PANEL - Abnormal; Notable for the following components:      Result Value   Glucose, Bld 129 (*)    Alkaline Phosphatase 31 (*)    All other components within normal limits  URINALYSIS, ROUTINE W REFLEX MICROSCOPIC - Abnormal; Notable for the following components:   APPearance HAZY (*)    Hgb urine dipstick TRACE (*)    All other components within normal limits  LIPASE, BLOOD  LACTIC ACID, PLASMA  CBC WITH DIFFERENTIAL/PLATELET  URINALYSIS, MICROSCOPIC (REFLEX)   ____________________________________________  RADIOLOGY  CT ABDOMEN PELVIS W  CONTRAST  Result Date: 02/02/2023 CLINICAL DATA:  Abdominal pain. EXAM: CT ABDOMEN AND PELVIS WITH CONTRAST TECHNIQUE: Multidetector CT imaging of the abdomen and pelvis was performed using the standard protocol following bolus administration of intravenous contrast. RADIATION DOSE REDUCTION: This exam was performed according to the departmental dose-optimization program which includes automated exposure control, adjustment of the mA and/or kV according to patient size and/or use of iterative reconstruction  technique. CONTRAST:  OMNIPAQUE IOHEXOL 300 MG/ML  SOLN COMPARISON:  02/13/2020 FINDINGS: Lower chest: No acute abnormality. Hepatobiliary: No suspicious liver abnormality. Segment 4 cyst adjacent to the falciform ligament measures 2.7 cm. Status post cholecystectomy. No intrahepatic bile duct dilatation. CBD measures up to 6 mm which is unchanged from previous exam. Pancreas: Unremarkable. No pancreatic ductal dilatation or surrounding inflammatory changes. Spleen: Normal in size without focal abnormality. Adrenals/Urinary Tract: Normal right adrenal gland. Previously characterized lipid poor adenoma measures 2.8 cm, image 26/3. Unchanged from the previous exam. No follow-up imaging recommended. Small simple appearing cysts are noted bilaterally. The largest is arises off the lower pole of right kidney measuring 1.1 cm. No follow-up imaging recommended. No signs of nephrolithiasis or obstructive uropathy. Urinary bladder is normal. Stomach/Bowel: Stomach is within normal limits. No pathologic dilatation of the large or small bowel loops. Left-sided colonic diverticulosis. Acute sigmoid diverticulosis is identified involving the sigmoid colon within the left iliac fossa. There is associated wall thickening with surrounding inflammatory fat stranding and thickening of the adjacent peritoneal reflection, image 66/3. No abscess or signs of pneumoperitoneum. Vascular/Lymphatic: Aortic atherosclerosis. No aneurysm. No signs of abdominopelvic adenopathy. Within the right aortocaval region there is a bilobed fluid density structure measuring 4.1 by 1.4 x 1.8 cm. This was seen on the previous exam and most likely represents a benign process. Reproductive: Status post hysterectomy. No adnexal masses. Other: No significant free fluid or fluid collections. Musculoskeletal: No acute or significant osseous findings. IMPRESSION: 1. Acute sigmoid diverticulitis. No abscess or pneumoperitoneum. 2. Aortic Atherosclerosis  (ICD10-I70.0). Electronically Signed   By: Signa Kell M.D.   On: 02/02/2023 06:30    ____________________________________________   PROCEDURES  Procedure(s) performed:   Procedures  None  ____________________________________________   INITIAL IMPRESSION / ASSESSMENT AND PLAN / ED COURSE  Pertinent labs & imaging results that were available during my care of the patient were reviewed by me and considered in my medical decision making (see chart for details).   This patient is Presenting for Evaluation of abdominal pain, which does require a range of treatment options, and is a complaint that involves a high risk of morbidity and mortality.  The Differential Diagnoses includes but is not exclusive to ovarian cyst, ovarian torsion, acute appendicitis, urinary tract infection, endometriosis, bowel obstruction, hernia, colitis, renal colic, gastroenteritis, volvulus etc.   Critical Interventions-    Medications  iohexol (OMNIPAQUE) 300 MG/ML solution 100 mL (100 mLs Intravenous Contrast Given 02/02/23 0544)    Reassessment after intervention: patient's pain well controlled.    I did obtain Additional Historical Information from family at bedside.    Clinical Laboratory Tests Ordered, included no UTI lactic acid normal.  No leukocytosis.  No acute kidney injury.  Radiologic Tests Ordered, included CT abdomen/pelvis. I independently interpreted the images and agree with radiology interpretation.   Cardiac Monitor Tracing which shows NSR.   Social Determinants of Health Risk patient is a non-smoker.   Medical Decision Making: Summary:  Patient presents to the emergency department for evaluation of abdominal pain.  Tenderness, mainly in  the left lower quadrant.  Considered broad differential including vascular etiologies for pain the patient has equal pulses and extremities are well-perfused.  Plan for CT abdomen pelvis with contrast along with labs and reassess.  No pain with  the chest to suspect atypical ACS.   Reevaluation with update and discussion with patient and family at bedside.  Pain is well-controlled.  We discussed CT findings showing uncomplicated acute diverticulitis.  Patient is doing reasonably well here and would like to try antibiotics at home.  Considered Augmentin for treatment but does have a cephalosporin allergy listed as hives and anaphylaxis.  Plan for Cipro Flagyl in this case and close PCP follow up.   Patient's presentation is most consistent with acute presentation with potential threat to life or bodily function.   Disposition: discharge  ____________________________________________  FINAL CLINICAL IMPRESSION(S) / ED DIAGNOSES  Final diagnoses:  Diverticulitis    NEW OUTPATIENT MEDICATIONS STARTED DURING THIS VISIT:  New Prescriptions   CIPROFLOXACIN (CIPRO) 500 MG TABLET    Take 1 tablet (500 mg total) by mouth every 12 (twelve) hours for 7 days.   METRONIDAZOLE (FLAGYL) 500 MG TABLET    Take 1 tablet (500 mg total) by mouth 2 (two) times daily for 7 days.    Note:  This document was prepared using Dragon voice recognition software and may include unintentional dictation errors.  Alona Bene, MD, [facility] Emergency Medicine    Gabino Hagin, Arlyss Repress, MD 02/02/23 [phone]

## 2023-02-02 NOTE — Discharge Instructions (Addendum)
You were seen in the emergency department today with abdominal pain.  The CT scan shows diverticulitis which we are treating with antibiotics.  You may continue with your home pain medications.  Please follow with your primary care doctor in the coming week.  If you develop worsening pain, fever, confusion, or vomiting and you are unable to keep down your medicines please return to the emergency department.

## 2023-02-22 DIAGNOSIS — B3731 Acute candidiasis of vulva and vagina: Secondary | ICD-10-CM | POA: Diagnosis not present

## 2023-03-22 DIAGNOSIS — H16223 Keratoconjunctivitis sicca, not specified as Sjogren's, bilateral: Secondary | ICD-10-CM | POA: Diagnosis not present

## 2023-04-03 DIAGNOSIS — R5383 Other fatigue: Secondary | ICD-10-CM | POA: Diagnosis not present

## 2023-04-03 DIAGNOSIS — L0100 Impetigo, unspecified: Secondary | ICD-10-CM | POA: Diagnosis not present

## 2023-04-03 DIAGNOSIS — Z03818 Encounter for observation for suspected exposure to other biological agents ruled out: Secondary | ICD-10-CM | POA: Diagnosis not present

## 2023-05-05 DIAGNOSIS — M62838 Other muscle spasm: Secondary | ICD-10-CM | POA: Diagnosis not present

## 2023-05-05 DIAGNOSIS — G373 Acute transverse myelitis in demyelinating disease of central nervous system: Secondary | ICD-10-CM | POA: Diagnosis not present

## 2023-05-05 DIAGNOSIS — G243 Spasmodic torticollis: Secondary | ICD-10-CM | POA: Diagnosis not present

## 2023-07-11 DIAGNOSIS — M75101 Unspecified rotator cuff tear or rupture of right shoulder, not specified as traumatic: Secondary | ICD-10-CM | POA: Diagnosis not present

## 2023-07-11 DIAGNOSIS — R7303 Prediabetes: Secondary | ICD-10-CM | POA: Diagnosis not present

## 2023-07-11 DIAGNOSIS — I10 Essential (primary) hypertension: Secondary | ICD-10-CM | POA: Diagnosis not present

## 2023-07-11 DIAGNOSIS — Z1331 Encounter for screening for depression: Secondary | ICD-10-CM | POA: Diagnosis not present

## 2023-07-11 DIAGNOSIS — Z23 Encounter for immunization: Secondary | ICD-10-CM | POA: Diagnosis not present

## 2023-07-11 DIAGNOSIS — Z79899 Other long term (current) drug therapy: Secondary | ICD-10-CM | POA: Diagnosis not present

## 2023-07-11 DIAGNOSIS — I700 Atherosclerosis of aorta: Secondary | ICD-10-CM | POA: Diagnosis not present

## 2023-07-11 DIAGNOSIS — J449 Chronic obstructive pulmonary disease, unspecified: Secondary | ICD-10-CM | POA: Diagnosis not present

## 2023-07-11 DIAGNOSIS — E7800 Pure hypercholesterolemia, unspecified: Secondary | ICD-10-CM | POA: Diagnosis not present

## 2023-07-11 DIAGNOSIS — G373 Acute transverse myelitis in demyelinating disease of central nervous system: Secondary | ICD-10-CM | POA: Diagnosis not present

## 2023-07-11 DIAGNOSIS — M545 Low back pain, unspecified: Secondary | ICD-10-CM | POA: Diagnosis not present

## 2023-07-11 DIAGNOSIS — G4709 Other insomnia: Secondary | ICD-10-CM | POA: Diagnosis not present

## 2023-07-11 DIAGNOSIS — M810 Age-related osteoporosis without current pathological fracture: Secondary | ICD-10-CM | POA: Diagnosis not present

## 2023-07-11 DIAGNOSIS — G8929 Other chronic pain: Secondary | ICD-10-CM | POA: Diagnosis not present

## 2023-07-11 DIAGNOSIS — Z0000 Encounter for general adult medical examination without abnormal findings: Secondary | ICD-10-CM | POA: Diagnosis not present

## 2023-07-21 DIAGNOSIS — H16223 Keratoconjunctivitis sicca, not specified as Sjogren's, bilateral: Secondary | ICD-10-CM | POA: Diagnosis not present

## 2023-07-27 ENCOUNTER — Other Ambulatory Visit: Payer: Self-pay | Admitting: Internal Medicine

## 2023-07-27 DIAGNOSIS — M545 Low back pain, unspecified: Secondary | ICD-10-CM

## 2023-08-11 DIAGNOSIS — M75101 Unspecified rotator cuff tear or rupture of right shoulder, not specified as traumatic: Secondary | ICD-10-CM | POA: Diagnosis not present

## 2023-08-11 DIAGNOSIS — I10 Essential (primary) hypertension: Secondary | ICD-10-CM | POA: Diagnosis not present

## 2023-08-11 DIAGNOSIS — M545 Low back pain, unspecified: Secondary | ICD-10-CM | POA: Diagnosis not present

## 2023-08-11 DIAGNOSIS — F5104 Psychophysiologic insomnia: Secondary | ICD-10-CM | POA: Diagnosis not present

## 2023-08-11 DIAGNOSIS — G8929 Other chronic pain: Secondary | ICD-10-CM | POA: Diagnosis not present

## 2023-08-11 DIAGNOSIS — R260 Ataxic gait: Secondary | ICD-10-CM | POA: Diagnosis not present

## 2023-09-03 ENCOUNTER — Ambulatory Visit
Admission: RE | Admit: 2023-09-03 | Discharge: 2023-09-03 | Disposition: A | Discharge status: Home / Self Care | Payer: Medicare Other | Source: Ambulatory Visit | Attending: Internal Medicine | Admitting: Internal Medicine

## 2023-09-03 DIAGNOSIS — M545 Low back pain, unspecified: Secondary | ICD-10-CM

## 2023-09-03 DIAGNOSIS — M48061 Spinal stenosis, lumbar region without neurogenic claudication: Secondary | ICD-10-CM | POA: Diagnosis not present

## 2023-09-03 DIAGNOSIS — M5126 Other intervertebral disc displacement, lumbar region: Secondary | ICD-10-CM | POA: Diagnosis not present

## 2023-09-03 DIAGNOSIS — M47816 Spondylosis without myelopathy or radiculopathy, lumbar region: Secondary | ICD-10-CM | POA: Diagnosis not present

## 2023-09-03 MED ORDER — GADOPICLENOL 0.5 MMOL/ML IV SOLN
7.0000 mL | Freq: Once | INTRAVENOUS | Status: AC | PRN
  Administered 2023-09-03: 7 mL via INTRAVENOUS

## 2023-10-09 DIAGNOSIS — M48062 Spinal stenosis, lumbar region with neurogenic claudication: Secondary | ICD-10-CM | POA: Diagnosis not present

## 2023-10-09 DIAGNOSIS — M5416 Radiculopathy, lumbar region: Secondary | ICD-10-CM | POA: Diagnosis not present

## 2023-10-10 ENCOUNTER — Emergency Department (HOSPITAL_BASED_OUTPATIENT_CLINIC_OR_DEPARTMENT_OTHER)
Admission: EM | Admit: 2023-10-10 | Discharge: 2023-10-11 | Disposition: A | Discharge status: Home / Self Care | Attending: Emergency Medicine | Admitting: Emergency Medicine

## 2023-10-10 ENCOUNTER — Encounter (HOSPITAL_BASED_OUTPATIENT_CLINIC_OR_DEPARTMENT_OTHER): Payer: Self-pay | Admitting: Urology

## 2023-10-10 DIAGNOSIS — Z79899 Other long term (current) drug therapy: Secondary | ICD-10-CM | POA: Insufficient documentation

## 2023-10-10 DIAGNOSIS — R4182 Altered mental status, unspecified: Secondary | ICD-10-CM | POA: Diagnosis not present

## 2023-10-10 DIAGNOSIS — Z8659 Personal history of other mental and behavioral disorders: Secondary | ICD-10-CM

## 2023-10-10 DIAGNOSIS — I2510 Atherosclerotic heart disease of native coronary artery without angina pectoris: Secondary | ICD-10-CM | POA: Insufficient documentation

## 2023-10-10 DIAGNOSIS — Z7982 Long term (current) use of aspirin: Secondary | ICD-10-CM | POA: Diagnosis not present

## 2023-10-10 DIAGNOSIS — I10 Essential (primary) hypertension: Secondary | ICD-10-CM | POA: Diagnosis not present

## 2023-10-10 DIAGNOSIS — R519 Headache, unspecified: Secondary | ICD-10-CM | POA: Diagnosis not present

## 2023-10-10 DIAGNOSIS — F99 Mental disorder, not otherwise specified: Secondary | ICD-10-CM | POA: Diagnosis not present

## 2023-10-10 NOTE — ED Triage Notes (Signed)
 Pt states continued and worsening back pain  States had appointment for spinal injections next week but pain is now worse and making it hard to walk  States pain radiating down right leg and up to right neck States BP elevated to 180 at home

## 2023-10-11 ENCOUNTER — Emergency Department (HOSPITAL_BASED_OUTPATIENT_CLINIC_OR_DEPARTMENT_OTHER)

## 2023-10-11 DIAGNOSIS — R519 Headache, unspecified: Secondary | ICD-10-CM | POA: Diagnosis not present

## 2023-10-11 DIAGNOSIS — R4182 Altered mental status, unspecified: Secondary | ICD-10-CM | POA: Diagnosis not present

## 2023-10-11 LAB — CBC WITH DIFFERENTIAL/PLATELET
Abs Immature Granulocytes: 0.02 10*3/uL (ref 0.00–0.07)
Basophils Absolute: 0 10*3/uL (ref 0.0–0.1)
Basophils Relative: 0 %
Eosinophils Absolute: 0.1 10*3/uL (ref 0.0–0.5)
Eosinophils Relative: 2 %
HCT: 40.6 % (ref 36.0–46.0)
Hemoglobin: 13.9 g/dL (ref 12.0–15.0)
Immature Granulocytes: 0 %
Lymphocytes Relative: 16 %
Lymphs Abs: 1 10*3/uL (ref 0.7–4.0)
MCH: 31.7 pg (ref 26.0–34.0)
MCHC: 34.2 g/dL (ref 30.0–36.0)
MCV: 92.5 fL (ref 80.0–100.0)
Monocytes Absolute: 0.7 10*3/uL (ref 0.1–1.0)
Monocytes Relative: 11 %
Neutro Abs: 4.2 10*3/uL (ref 1.7–7.7)
Neutrophils Relative %: 71 %
Platelets: 197 10*3/uL (ref 150–400)
RBC: 4.39 MIL/uL (ref 3.87–5.11)
RDW: 13.5 % (ref 11.5–15.5)
WBC: 6.1 10*3/uL (ref 4.0–10.5)
nRBC: 0 % (ref 0.0–0.2)

## 2023-10-11 LAB — BASIC METABOLIC PANEL
Anion gap: 10 (ref 5–15)
BUN: 12 mg/dL (ref 8–23)
CO2: 27 mmol/L (ref 22–32)
Calcium: 9.3 mg/dL (ref 8.9–10.3)
Chloride: 101 mmol/L (ref 98–111)
Creatinine, Ser: 0.84 mg/dL (ref 0.44–1.00)
GFR, Estimated: >60 mL/min (ref >60)
Glucose, Bld: 114 mg/dL — ABNORMAL HIGH (ref 70–99)
Potassium: 3.7 mmol/L (ref 3.5–5.1)
Sodium: 138 mmol/L (ref 135–145)

## 2023-10-11 NOTE — ED Provider Notes (Signed)
 [place] [facility] Provider Note   CSN: [id number] Arrival date & time: 10/10/23  2022     History  Chief Complaint  Patient presents with   Back Pain    Kim Sutton is a 89 y.o. female.  Patient is an 89 year old female with past medical history of coronary artery disease with stent in 2001, hypertension, hyperlipidemia.  She also has a longstanding history of chronic back pain and has been diagnosed with transverse myelitis.  She is followed by a neurologist and is due to have steroid injections in the near future.  Patient's back pain has been somewhat more significant recently.  Today she experienced an episode of what the husband describes as being "out of it" for a few seconds, then symptoms resolved.  She had another episode similar to this while in the waiting room, but is now back to baseline.  She denies any weakness or numbness.  She denies any visual disturbances.  She does report some headache and noticed that her blood pressure was elevated at home as high as 180.  Husband is concerned that she may have had a stroke.  The history is provided by the patient.       Home Medications Prior to Admission medications   Medication Sig Start Date End Date Taking? Authorizing Provider  aspirin 81 MG tablet Take 324 mg by mouth daily.     [provider]  diclofenac Sodium (VOLTAREN) 1 % GEL Apply 2 g topically 4 (four) times daily as needed. 05/30/22   Long, Arlyss Repress, MD  lisinopril (ZESTRIL) 20 MG tablet Take 20 mg by mouth daily.    [provider]  metoprolol succinate (TOPROL-XL) 50 MG 24 hr tablet Take 50 mg by mouth daily. Take with or immediately following a meal.    [provider]  metoprolol tartrate (LOPRESSOR) 50 MG tablet Take 50 mg by mouth 2 (two) times daily.    [provider]  oxyCODONE-acetaminophen (PERCOCET/ROXICET) 5-325 MG tablet Take 1 tablet by mouth every 6 (six) hours as needed  for severe pain. 05/30/22   Long, Arlyss Repress, MD  rosuvastatin (CRESTOR) 5 MG tablet Take 1 tablet (5 mg total) by mouth daily. 05/05/22 04/30/23  Little Ishikawa, MD  senna-docusate (SENOKOT-S) 8.6-50 MG tablet Take 1 tablet by mouth at bedtime as needed for mild constipation or moderate constipation. 05/30/22   Long, Arlyss Repress, MD  SPIRIVA RESPIMAT 2.5 MCG/ACT AERS INHALE 2 PUFFS ONCE DAILY 90 for 30    [provider]  ZETIA 10 MG tablet  07/22/13   [provider]      Allergies    Ceclor [cefaclor] and Atorvastatin    Review of Systems   Review of Systems  All other systems reviewed and are negative.   Physical Exam Updated Vital Signs BP (!) 181/83 (BP Location: Left Arm)   Pulse 100   Temp 98.1 F (36.7 C)   Resp 16   Ht 5\' 6"  (1.676 m)   Wt 72.6 kg   SpO2 92%   BMI 25.83 kg/m  Physical Exam Vitals and nursing note reviewed.  Constitutional:      General: She is not in acute distress.    Appearance: She is well-developed. She is not diaphoretic.  HENT:     Head: Normocephalic and atraumatic.  Eyes:     Extraocular Movements: Extraocular movements intact.     Pupils: Pupils are equal, round, and reactive to light.  Cardiovascular:     Rate and Rhythm: Normal rate and regular rhythm.     Heart sounds: No murmur heard.    No friction rub. No gallop.  Pulmonary:     Effort: Pulmonary effort is normal. No respiratory distress.     Breath sounds: Normal breath sounds. No wheezing.  Abdominal:     General: Bowel sounds are normal. There is no distension.     Palpations: Abdomen is soft.     Tenderness: There is no abdominal tenderness.  Musculoskeletal:        General: Normal range of motion.     Cervical back: Normal range of motion and neck supple.  Skin:    General: Skin is warm and dry.  Neurological:     General: No focal deficit present.     Mental Status: She is alert and oriented to person, place, and time.     Cranial Nerves: No  cranial nerve deficit.     Sensory: No sensory deficit.     Motor: No weakness.     Coordination: Coordination normal.     ED Results / Procedures / Treatments   Labs (all labs ordered are listed, but only abnormal results are displayed) Labs Reviewed - No data to display  EKG None  Radiology No results found.  Procedures Procedures    Medications Ordered in ED Medications - No data to display  ED Course/ Medical Decision Making/ A&P  Patient is an 89 year old female presenting with complaints of mental status changes described in the HPI.  Patient arrives with stable vital signs and is afebrile.  She is alert and oriented x 4 and completely neurologically intact and at baseline at the time of my evaluation.    Workup initiated including CBC and basic metabolic panel, both of which are unremarkable.  CT scan of the head shows no acute process.  Patient has been observed and has had no further symptoms.  I highly doubt a stroke as these 2 episodes only lasted for a few seconds, then spontaneously resolved.  She does have blood pressure in the 1 8190 systolic range, however has not taken her antihypertensive medications today.  I have advised him to take these medications and rest.  To return as needed for any problems.  Final Clinical Impression(s) / ED Diagnoses Final diagnoses:  None    Rx / DC Orders ED Discharge Orders     None         Geoffery Lyons, MD 10/11/23 0157

## 2023-10-11 NOTE — ED Notes (Signed)
 Discharge instructions reviewed.   Medications discussed by MD and encouraged to continue taking antihypertensives. Make notification with PCP.   Opportunity for questions and concerns provided.   Alert, oriented and escorted to vehicle via wheelchair.

## 2023-10-11 NOTE — ED Provider Notes (Incomplete)
  [place] [facility] Provider Note   CSN: [id number] Arrival date & time: 10/10/23  2022     History {Add pertinent medical, surgical, social history, OB history to HPI:1} Chief Complaint  Patient presents with  . Back Pain    Kim Sutton is a 89 y.o. female.  HPI     Home Medications Prior to Admission medications   Medication Sig Start Date End Date Taking? Authorizing Provider  aspirin 81 MG tablet Take 324 mg by mouth daily.     [provider]  diclofenac Sodium (VOLTAREN) 1 % GEL Apply 2 g topically 4 (four) times daily as needed. 05/30/22   Long, Arlyss Repress, MD  lisinopril (ZESTRIL) 20 MG tablet Take 20 mg by mouth daily.    [provider]  metoprolol succinate (TOPROL-XL) 50 MG 24 hr tablet Take 50 mg by mouth daily. Take with or immediately following a meal.    [provider]  metoprolol tartrate (LOPRESSOR) 50 MG tablet Take 50 mg by mouth 2 (two) times daily.    [provider]  oxyCODONE-acetaminophen (PERCOCET/ROXICET) 5-325 MG tablet Take 1 tablet by mouth every 6 (six) hours as needed for severe pain. 05/30/22   Long, Arlyss Repress, MD  rosuvastatin (CRESTOR) 5 MG tablet Take 1 tablet (5 mg total) by mouth daily. 05/05/22 04/30/23  Little Ishikawa, MD  senna-docusate (SENOKOT-S) 8.6-50 MG tablet Take 1 tablet by mouth at bedtime as needed for mild constipation or moderate constipation. 05/30/22   Long, Arlyss Repress, MD  SPIRIVA RESPIMAT 2.5 MCG/ACT AERS INHALE 2 PUFFS ONCE DAILY 90 for 30    [provider]  ZETIA 10 MG tablet  07/22/13   [provider]      Allergies    Ceclor [cefaclor] and Atorvastatin    Review of Systems   Review of Systems  Physical Exam Updated Vital Signs BP (!) 181/83 (BP Location: Left Arm)   Pulse 100   Temp 98.1 F (36.7 C)   Resp 16   Ht 5\' 6"  (1.676 m)   Wt 72.6 kg   SpO2 92%   BMI 25.83 kg/m  Physical Exam  ED Results /  Procedures / Treatments   Labs (all labs ordered are listed, but only abnormal results are displayed) Labs Reviewed - No data to display  EKG None  Radiology No results found.  Procedures Procedures  {Document cardiac monitor, telemetry assessment procedure when appropriate:1}  Medications Ordered in ED Medications - No data to display  ED Course/ Medical Decision Making/ A&P   {   Click here for ABCD2, HEART and other calculatorsREFRESH Note before signing :1}                              Medical Decision Making Amount and/or Complexity of Data Reviewed Labs: ordered. Radiology: ordered.   ***  {Document critical care time when appropriate:1} {Document review of labs and clinical decision tools ie heart score, Chads2Vasc2 etc:1}  {Document your independent review of radiology images, and any outside records:1} {Document your discussion with family members, caretakers, and with consultants:1} {Document social determinants of health affecting pt's care:1} {Document your decision making why or why not admission, treatments were needed:1} Final Clinical Impression(s) / ED Diagnoses Final diagnoses:  None    Rx / DC Orders ED Discharge Orders     None

## 2023-10-11 NOTE — Discharge Instructions (Signed)
 Continue your antihypertensive medications as previously prescribed.  Follow-up with primary doctor, and return to the ER if symptoms significantly worsen or change.

## 2023-10-19 DIAGNOSIS — M5416 Radiculopathy, lumbar region: Secondary | ICD-10-CM | POA: Diagnosis not present

## 2023-10-27 DIAGNOSIS — I10 Essential (primary) hypertension: Secondary | ICD-10-CM | POA: Diagnosis not present

## 2023-10-27 DIAGNOSIS — J449 Chronic obstructive pulmonary disease, unspecified: Secondary | ICD-10-CM | POA: Diagnosis not present

## 2023-10-27 DIAGNOSIS — I700 Atherosclerosis of aorta: Secondary | ICD-10-CM | POA: Diagnosis not present

## 2023-11-29 DIAGNOSIS — I700 Atherosclerosis of aorta: Secondary | ICD-10-CM | POA: Diagnosis not present

## 2023-11-29 DIAGNOSIS — M810 Age-related osteoporosis without current pathological fracture: Secondary | ICD-10-CM | POA: Diagnosis not present

## 2023-11-29 DIAGNOSIS — J449 Chronic obstructive pulmonary disease, unspecified: Secondary | ICD-10-CM | POA: Diagnosis not present

## 2023-11-29 DIAGNOSIS — I10 Essential (primary) hypertension: Secondary | ICD-10-CM | POA: Diagnosis not present

## 2023-11-29 DIAGNOSIS — E7800 Pure hypercholesterolemia, unspecified: Secondary | ICD-10-CM | POA: Diagnosis not present

## 2023-12-02 DIAGNOSIS — J449 Chronic obstructive pulmonary disease, unspecified: Secondary | ICD-10-CM | POA: Diagnosis not present

## 2023-12-02 DIAGNOSIS — I10 Essential (primary) hypertension: Secondary | ICD-10-CM | POA: Diagnosis not present

## 2023-12-02 DIAGNOSIS — I700 Atherosclerosis of aorta: Secondary | ICD-10-CM | POA: Diagnosis not present

## 2023-12-18 ENCOUNTER — Other Ambulatory Visit: Payer: Self-pay

## 2023-12-18 ENCOUNTER — Emergency Department (HOSPITAL_BASED_OUTPATIENT_CLINIC_OR_DEPARTMENT_OTHER)
Admission: EM | Admit: 2023-12-18 | Discharge: 2023-12-18 | Disposition: A | Discharge status: Home / Self Care | Attending: Emergency Medicine | Admitting: Emergency Medicine

## 2023-12-18 ENCOUNTER — Encounter (HOSPITAL_BASED_OUTPATIENT_CLINIC_OR_DEPARTMENT_OTHER): Payer: Self-pay | Admitting: Emergency Medicine

## 2023-12-18 ENCOUNTER — Emergency Department (HOSPITAL_BASED_OUTPATIENT_CLINIC_OR_DEPARTMENT_OTHER)

## 2023-12-18 DIAGNOSIS — I2510 Atherosclerotic heart disease of native coronary artery without angina pectoris: Secondary | ICD-10-CM | POA: Insufficient documentation

## 2023-12-18 DIAGNOSIS — J929 Pleural plaque without asbestos: Secondary | ICD-10-CM | POA: Diagnosis not present

## 2023-12-18 DIAGNOSIS — R030 Elevated blood-pressure reading, without diagnosis of hypertension: Secondary | ICD-10-CM

## 2023-12-18 DIAGNOSIS — I700 Atherosclerosis of aorta: Secondary | ICD-10-CM | POA: Diagnosis not present

## 2023-12-18 DIAGNOSIS — Z79899 Other long term (current) drug therapy: Secondary | ICD-10-CM | POA: Insufficient documentation

## 2023-12-18 DIAGNOSIS — Z7982 Long term (current) use of aspirin: Secondary | ICD-10-CM | POA: Insufficient documentation

## 2023-12-18 DIAGNOSIS — R600 Localized edema: Secondary | ICD-10-CM | POA: Diagnosis not present

## 2023-12-18 DIAGNOSIS — I10 Essential (primary) hypertension: Secondary | ICD-10-CM | POA: Diagnosis not present

## 2023-12-18 DIAGNOSIS — I771 Stricture of artery: Secondary | ICD-10-CM | POA: Diagnosis not present

## 2023-12-18 DIAGNOSIS — Z87891 Personal history of nicotine dependence: Secondary | ICD-10-CM | POA: Insufficient documentation

## 2023-12-18 LAB — CBC
HCT: 37.4 % (ref 36.0–46.0)
Hemoglobin: 12.4 g/dL (ref 12.0–15.0)
MCH: 31.9 pg (ref 26.0–34.0)
MCHC: 33.2 g/dL (ref 30.0–36.0)
MCV: 96.1 fL (ref 80.0–100.0)
Platelets: 193 10*3/uL (ref 150–400)
RBC: 3.89 MIL/uL (ref 3.87–5.11)
RDW: 15.2 % (ref 11.5–15.5)
WBC: 5.9 10*3/uL (ref 4.0–10.5)
nRBC: 0 % (ref 0.0–0.2)

## 2023-12-18 LAB — BASIC METABOLIC PANEL WITH GFR
Anion gap: 11 (ref 5–15)
BUN: 17 mg/dL (ref 8–23)
CO2: 27 mmol/L (ref 22–32)
Calcium: 9.5 mg/dL (ref 8.9–10.3)
Chloride: 102 mmol/L (ref 98–111)
Creatinine, Ser: 1.09 mg/dL — ABNORMAL HIGH (ref 0.44–1.00)
GFR, Estimated: 49 mL/min — ABNORMAL LOW (ref >60)
Glucose, Bld: 133 mg/dL — ABNORMAL HIGH (ref 70–99)
Potassium: 4 mmol/L (ref 3.5–5.1)
Sodium: 140 mmol/L (ref 135–145)

## 2023-12-18 LAB — PRO BRAIN NATRIURETIC PEPTIDE: Pro Brain Natriuretic Peptide: 210 pg/mL (ref <300.0)

## 2023-12-18 LAB — TROPONIN T, HIGH SENSITIVITY
Troponin T High Sensitivity: 16 ng/L (ref <19)
Troponin T High Sensitivity: <15 ng/L (ref <19)

## 2023-12-18 NOTE — ED Triage Notes (Signed)
 Bilateral Leg pain x over 1 month , edema x 3 days , obvious edema to bilateral ankles . Denies shortness of breath. No chest pain .

## 2023-12-18 NOTE — ED Notes (Signed)
 Reviewed D/C information with the patient, pt verbalized understanding. No additional concerns at this time.

## 2023-12-18 NOTE — Discharge Instructions (Signed)
 Call your primary care provider tomorrow to let them know that you were seen in the emergency department tonight and that you need to schedule a follow-up appointment with them, continue your blood pressure medication as directed by your primary care provider, they will decide if your medications need to be adjusted.  Return to the emergency department if you develop chest pain, shortness of breath, severe headache, confusion, stroke symptoms including slurred speech/facial droop/weakness.

## 2023-12-18 NOTE — ED Provider Notes (Signed)
 [place] [facility] Provider Note   CSN: [id number] Arrival date & time: 12/18/23  1705     History  Chief Complaint  Patient presents with   Leg Pain   Leg Swelling    bilateral    Kim Sutton is a 89 y.o. female.  89 year old female presenting with elevated blood pressure reading at home.  Patient denies chest pain, shortness of breath, headache.  She has noticed some lower extremity edema, however this has been present for "a long time".  She checks her blood pressure daily and noticed her blood pressure was high at home today, she called her primary care provider who advised her to come to the emergency department.  Patient is compliant with her medications but believes that she takes her blood pressure medications in the evening, per chart review it appears that patient is on lisinopril and metoprolol however she was unable to confirm this as she does not have her medication list with her today.   Leg Pain      Home Medications Prior to Admission medications   Medication Sig Start Date End Date Taking? Authorizing Provider  aspirin  81 MG tablet Take 324 mg by mouth daily.     [provider]  diclofenac  Sodium (VOLTAREN ) 1 % GEL Apply 2 g topically 4 (four) times daily as needed. 05/30/22   Long, Joshua G, MD  lisinopril (ZESTRIL) 20 MG tablet Take 20 mg by mouth daily.    [provider]  metoprolol succinate (TOPROL-XL) 50 MG 24 hr tablet Take 50 mg by mouth daily. Take with or immediately following a meal.    [provider]  metoprolol tartrate (LOPRESSOR) 50 MG tablet Take 50 mg by mouth 2 (two) times daily.    [provider]  oxyCODONE -acetaminophen  (PERCOCET/ROXICET) 5-325 MG tablet Take 1 tablet by mouth every 6 (six) hours as needed for severe pain. 05/30/22   Long, Shereen Dike, MD  rosuvastatin  (CRESTOR ) 5 MG tablet Take 1 tablet (5 mg total) by mouth daily. 05/05/22 04/30/23  Wendie Hamburg, MD  senna-docusate (SENOKOT-S) 8.6-50 MG tablet Take 1 tablet by mouth at bedtime as needed for mild constipation or moderate constipation. 05/30/22   Long, Joshua G, MD  SPIRIVA RESPIMAT 2.5 MCG/ACT AERS INHALE 2 PUFFS ONCE DAILY 90 for 30    [provider]  ZETIA 10 MG tablet  07/22/13   [provider]      Allergies    Ceclor [cefaclor] and Atorvastatin    Review of Systems   Review of Systems  Physical Exam Updated Vital Signs BP (!) 209/80 (BP Location: Right Arm)   Pulse 65   Temp 98 F (36.7 C)   Resp 16   Wt 73.5 kg   SpO2 93%   BMI 26.15 kg/m  Physical Exam Vitals and nursing note reviewed.  HENT:     Head: Normocephalic.  Eyes:     Extraocular Movements: Extraocular movements intact.     Pupils: Pupils are equal, round, and reactive to light.  Cardiovascular:     Rate and Rhythm: Normal rate and regular rhythm.     Pulses:          Dorsalis pedis pulses are 2+ on the right side and 2+ on the left side.     Heart sounds: Murmur heard.  Pulmonary:     Effort: Pulmonary effort is normal. No respiratory distress.     Breath sounds: Normal breath sounds.  Abdominal:     Palpations: Abdomen is soft.  Musculoskeletal:     Cervical back: Normal range of motion.     Right lower leg: Edema present.     Left lower leg: Edema present.     Comments: Moves all extremities spontaneously without difficulty Pitting lower extremity edema, R > L   Skin:    General: Skin is warm and dry.     Comments: Chronic skin changes to the lower extremities consistent in appearance with brawny edema, L > R, no weeping/open wounds  Neurological:     General: No focal deficit present.     Mental Status: She is alert and oriented to person, place, and time.     Sensory: No sensory deficit.     Motor: No weakness.    ED Results / Procedures / Treatments   Labs (all labs ordered are listed, but only abnormal results are displayed) Labs Reviewed   BASIC METABOLIC PANEL WITH GFR - Abnormal; Notable for the following components:      Result Value   Glucose, Bld 133 (*)    Creatinine, Ser 1.09 (*)    GFR, Estimated 49 (*)    All other components within normal limits  CBC  PRO BRAIN NATRIURETIC PEPTIDE  TROPONIN T, HIGH SENSITIVITY  TROPONIN T, HIGH SENSITIVITY    EKG None  Radiology DG Chest 2 View Result Date: 12/18/2023 CLINICAL DATA:  ankles edema, bilateral leg pain EXAM: CHEST - 2 VIEW COMPARISON:  Dec 06, 2022 FINDINGS: Biapical pleural thickening. No focal airspace consolidation, pleural effusion, or pneumothorax. No cardiomegaly. Tortuous aorta with aortic atherosclerosis. No acute fracture or destructive lesion. Osteopenia. Multilevel degenerative disc disease of the spine. IMPRESSION: No acute cardiopulmonary abnormality. Electronically Signed   By: Rance Burrows M.D.   On: 12/18/2023 19:00    Procedures Procedures    Medications Ordered in ED Medications - No data to display  ED Course/ Medical Decision Making/ A&P                                 Medical Decision Making This patient presents to the ED for concern of elevated BP at home, this involves an extensive number of treatment options, and is a complaint that carries with it a high risk of complications and morbidity.  The differential diagnosis includes poorly controlled hypertension, hypertensive emergency, hypertensive urgency, medication noncompliance.   Co morbidities that complicate the patient evaluation  Coronary artery disease, hypertension, hyperlipidemia   Additional history obtained:  Additional history obtained from record review External records from outside source obtained and reviewed including recent PCP notes   Lab Tests:  I Ordered, and personally interpreted labs.  The pertinent results include: CBC within normal limits, BMP notable for creatinine of 1.09, this is slightly elevated as compared to patient's BMP from 2 months  ago however this is likely not indicative of an acute kidney injury, upon review of prior creatinine levels she has fluctuated with a creatinine as high as 1.17 in the past 6 years.  Initial and repeat troponin is within normal limits.  BNP is 210, we have no baseline to compare this to.   Imaging Studies ordered:  I ordered imaging studies including chest x-ray I independently visualized and interpreted imaging which showed no acute cardiopulmonary findings I agree with the radiologist interpretation   Cardiac Monitoring: / EKG:  The patient was maintained on a cardiac monitor.  I personally viewed and interpreted the cardiac monitored which showed an underlying rhythm of: Normal sinus rhythm   Problem List / ED Course / Critical interventions / Medication management  I have reviewed the patients home medicines and have made adjustments as needed   Social Determinants of Health:  Former tobacco use   Test / Admission - Considered:  Patient presenting to the emergency department due to elevated blood pressure reading at home.  Patient is not exhibiting any neurologic deficits at this time, she denies headache/chest pain/shortness of breath.  She does have some lower extremity edema, see above for detailed physical exam findings, however she notes that this is an ongoing issue.  There are no symptoms or physical exam findings in this patient that correlate with a hypertensive emergency crisis, she does not complain of headache therefore I elected to not proceed with CT imaging of her head.  Advised patient that she can continue to wear compression stockings to help with her lower extremity edema.  Discussed in depth with patient and her support person that despite her blood pressure being high this evening, we do not need to make any adjustments to her blood pressure medication given that she is not symptomatic.  I advised patient to follow-up with her primary care provider in the morning,  they likely will want to follow-up with her this week and will decide at that time if they need to make adjustments to her medication.  It is also pertinent to note that patient takes her blood pressure medication in the evening, during my examination she is overdue for her medication and this is likely contributing to her elevated blood pressure.  Labs are reassuring as above, chest x-ray without acute cardiopulmonary findings.  Patient voices understanding and is in agreement with this plan.  Strict return precautions discussed.  Patient is appropriate for discharge at this time.  Staffed with Dr. Zackowski  Amount and/or Complexity of Data Reviewed Labs: ordered. Radiology: ordered.           Final Clinical Impression(s) / ED Diagnoses Final diagnoses:  Elevated blood pressure reading    Rx / DC Orders ED Discharge Orders     None         Epifania Littrell N, PA-C 12/19/23 0004    Nicklas Barns, MD 12/22/23 [phone]

## 2023-12-21 DIAGNOSIS — M5136 Other intervertebral disc degeneration, lumbar region with discogenic back pain only: Secondary | ICD-10-CM | POA: Diagnosis not present

## 2023-12-21 DIAGNOSIS — I10 Essential (primary) hypertension: Secondary | ICD-10-CM | POA: Diagnosis not present

## 2023-12-21 DIAGNOSIS — I872 Venous insufficiency (chronic) (peripheral): Secondary | ICD-10-CM | POA: Diagnosis not present

## 2023-12-21 DIAGNOSIS — I2510 Atherosclerotic heart disease of native coronary artery without angina pectoris: Secondary | ICD-10-CM | POA: Diagnosis not present

## 2023-12-30 DIAGNOSIS — M810 Age-related osteoporosis without current pathological fracture: Secondary | ICD-10-CM | POA: Diagnosis not present

## 2023-12-30 DIAGNOSIS — E7800 Pure hypercholesterolemia, unspecified: Secondary | ICD-10-CM | POA: Diagnosis not present

## 2023-12-30 DIAGNOSIS — J449 Chronic obstructive pulmonary disease, unspecified: Secondary | ICD-10-CM | POA: Diagnosis not present

## 2023-12-30 DIAGNOSIS — I10 Essential (primary) hypertension: Secondary | ICD-10-CM | POA: Diagnosis not present

## 2023-12-30 DIAGNOSIS — I700 Atherosclerosis of aorta: Secondary | ICD-10-CM | POA: Diagnosis not present

## 2024-01-01 DIAGNOSIS — I10 Essential (primary) hypertension: Secondary | ICD-10-CM | POA: Diagnosis not present

## 2024-01-01 DIAGNOSIS — I700 Atherosclerosis of aorta: Secondary | ICD-10-CM | POA: Diagnosis not present

## 2024-01-01 DIAGNOSIS — J449 Chronic obstructive pulmonary disease, unspecified: Secondary | ICD-10-CM | POA: Diagnosis not present

## 2024-01-18 DIAGNOSIS — M48062 Spinal stenosis, lumbar region with neurogenic claudication: Secondary | ICD-10-CM | POA: Diagnosis not present

## 2024-01-23 DIAGNOSIS — R319 Hematuria, unspecified: Secondary | ICD-10-CM | POA: Diagnosis not present

## 2024-01-24 ENCOUNTER — Other Ambulatory Visit (HOSPITAL_COMMUNITY): Payer: Self-pay | Admitting: Physician Assistant

## 2024-01-24 DIAGNOSIS — R319 Hematuria, unspecified: Secondary | ICD-10-CM

## 2024-01-26 ENCOUNTER — Ambulatory Visit (HOSPITAL_COMMUNITY)
Admission: RE | Admit: 2024-01-26 | Discharge: 2024-01-26 | Disposition: A | Discharge status: Home / Self Care | Source: Ambulatory Visit | Attending: Physician Assistant | Admitting: Physician Assistant

## 2024-01-26 DIAGNOSIS — R319 Hematuria, unspecified: Secondary | ICD-10-CM | POA: Diagnosis not present

## 2024-01-26 DIAGNOSIS — N281 Cyst of kidney, acquired: Secondary | ICD-10-CM | POA: Diagnosis not present

## 2024-01-29 ENCOUNTER — Other Ambulatory Visit: Payer: Self-pay | Admitting: Internal Medicine

## 2024-01-29 DIAGNOSIS — N289 Disorder of kidney and ureter, unspecified: Secondary | ICD-10-CM

## 2024-01-29 DIAGNOSIS — R319 Hematuria, unspecified: Secondary | ICD-10-CM

## 2024-01-31 DIAGNOSIS — J449 Chronic obstructive pulmonary disease, unspecified: Secondary | ICD-10-CM | POA: Diagnosis not present

## 2024-01-31 DIAGNOSIS — I700 Atherosclerosis of aorta: Secondary | ICD-10-CM | POA: Diagnosis not present

## 2024-01-31 DIAGNOSIS — I10 Essential (primary) hypertension: Secondary | ICD-10-CM | POA: Diagnosis not present

## 2024-02-13 ENCOUNTER — Ambulatory Visit
Admission: RE | Admit: 2024-02-13 | Discharge: 2024-02-13 | Disposition: A | Discharge status: Home / Self Care | Source: Ambulatory Visit | Attending: Internal Medicine | Admitting: Internal Medicine

## 2024-02-13 DIAGNOSIS — K7689 Other specified diseases of liver: Secondary | ICD-10-CM | POA: Diagnosis not present

## 2024-02-13 DIAGNOSIS — R319 Hematuria, unspecified: Secondary | ICD-10-CM

## 2024-02-13 DIAGNOSIS — N281 Cyst of kidney, acquired: Secondary | ICD-10-CM | POA: Diagnosis not present

## 2024-02-13 DIAGNOSIS — N3289 Other specified disorders of bladder: Secondary | ICD-10-CM | POA: Diagnosis not present

## 2024-02-13 DIAGNOSIS — E278 Other specified disorders of adrenal gland: Secondary | ICD-10-CM | POA: Diagnosis not present

## 2024-02-13 DIAGNOSIS — N289 Disorder of kidney and ureter, unspecified: Secondary | ICD-10-CM

## 2024-02-13 MED ORDER — IOPAMIDOL (ISOVUE-370) INJECTION 76%
125.0000 mL | Freq: Once | INTRAVENOUS | Status: AC | PRN
  Administered 2024-02-13: 125 mL via INTRAVENOUS

## 2024-02-20 DIAGNOSIS — R1030 Lower abdominal pain, unspecified: Secondary | ICD-10-CM | POA: Diagnosis not present

## 2024-02-29 DIAGNOSIS — E7800 Pure hypercholesterolemia, unspecified: Secondary | ICD-10-CM | POA: Diagnosis not present

## 2024-02-29 DIAGNOSIS — J449 Chronic obstructive pulmonary disease, unspecified: Secondary | ICD-10-CM | POA: Diagnosis not present

## 2024-02-29 DIAGNOSIS — M810 Age-related osteoporosis without current pathological fracture: Secondary | ICD-10-CM | POA: Diagnosis not present

## 2024-02-29 DIAGNOSIS — I10 Essential (primary) hypertension: Secondary | ICD-10-CM | POA: Diagnosis not present

## 2024-03-01 DIAGNOSIS — I10 Essential (primary) hypertension: Secondary | ICD-10-CM | POA: Diagnosis not present

## 2024-03-01 DIAGNOSIS — I700 Atherosclerosis of aorta: Secondary | ICD-10-CM | POA: Diagnosis not present

## 2024-03-01 DIAGNOSIS — J449 Chronic obstructive pulmonary disease, unspecified: Secondary | ICD-10-CM | POA: Diagnosis not present

## 2024-03-07 DIAGNOSIS — R54 Age-related physical debility: Secondary | ICD-10-CM | POA: Diagnosis not present

## 2024-03-07 DIAGNOSIS — I2510 Atherosclerotic heart disease of native coronary artery without angina pectoris: Secondary | ICD-10-CM | POA: Diagnosis not present

## 2024-03-07 DIAGNOSIS — M51362 Other intervertebral disc degeneration, lumbar region with discogenic back pain and lower extremity pain: Secondary | ICD-10-CM | POA: Diagnosis not present

## 2024-03-07 DIAGNOSIS — I10 Essential (primary) hypertension: Secondary | ICD-10-CM | POA: Diagnosis not present

## 2024-03-31 DIAGNOSIS — I10 Essential (primary) hypertension: Secondary | ICD-10-CM | POA: Diagnosis not present

## 2024-03-31 DIAGNOSIS — I700 Atherosclerosis of aorta: Secondary | ICD-10-CM | POA: Diagnosis not present

## 2024-03-31 DIAGNOSIS — J449 Chronic obstructive pulmonary disease, unspecified: Secondary | ICD-10-CM | POA: Diagnosis not present

## 2024-03-31 DIAGNOSIS — M810 Age-related osteoporosis without current pathological fracture: Secondary | ICD-10-CM | POA: Diagnosis not present

## 2024-03-31 DIAGNOSIS — E7800 Pure hypercholesterolemia, unspecified: Secondary | ICD-10-CM | POA: Diagnosis not present

## 2024-04-30 DIAGNOSIS — M810 Age-related osteoporosis without current pathological fracture: Secondary | ICD-10-CM | POA: Diagnosis not present

## 2024-04-30 DIAGNOSIS — I10 Essential (primary) hypertension: Secondary | ICD-10-CM | POA: Diagnosis not present

## 2024-04-30 DIAGNOSIS — J449 Chronic obstructive pulmonary disease, unspecified: Secondary | ICD-10-CM | POA: Diagnosis not present

## 2024-04-30 DIAGNOSIS — E7800 Pure hypercholesterolemia, unspecified: Secondary | ICD-10-CM | POA: Diagnosis not present

## 2024-04-30 DIAGNOSIS — I700 Atherosclerosis of aorta: Secondary | ICD-10-CM | POA: Diagnosis not present

## 2024-05-07 DIAGNOSIS — R54 Age-related physical debility: Secondary | ICD-10-CM | POA: Diagnosis not present

## 2024-05-07 DIAGNOSIS — I10 Essential (primary) hypertension: Secondary | ICD-10-CM | POA: Diagnosis not present

## 2024-05-07 DIAGNOSIS — I872 Venous insufficiency (chronic) (peripheral): Secondary | ICD-10-CM | POA: Diagnosis not present

## 2024-05-07 DIAGNOSIS — Z23 Encounter for immunization: Secondary | ICD-10-CM | POA: Diagnosis not present

## 2024-05-07 DIAGNOSIS — M5136 Other intervertebral disc degeneration, lumbar region with discogenic back pain only: Secondary | ICD-10-CM | POA: Diagnosis not present

## 2024-05-07 DIAGNOSIS — T148XXA Other injury of unspecified body region, initial encounter: Secondary | ICD-10-CM | POA: Diagnosis not present

## 2024-05-16 ENCOUNTER — Other Ambulatory Visit: Payer: Self-pay

## 2024-05-16 ENCOUNTER — Emergency Department (HOSPITAL_BASED_OUTPATIENT_CLINIC_OR_DEPARTMENT_OTHER)
Admission: EM | Admit: 2024-05-16 | Discharge: 2024-05-16 | Disposition: A | Discharge status: Home / Self Care | Attending: Emergency Medicine | Admitting: Emergency Medicine

## 2024-05-16 ENCOUNTER — Encounter (HOSPITAL_BASED_OUTPATIENT_CLINIC_OR_DEPARTMENT_OTHER): Payer: Self-pay

## 2024-05-16 ENCOUNTER — Emergency Department (HOSPITAL_BASED_OUTPATIENT_CLINIC_OR_DEPARTMENT_OTHER)

## 2024-05-16 DIAGNOSIS — I10 Essential (primary) hypertension: Secondary | ICD-10-CM | POA: Insufficient documentation

## 2024-05-16 DIAGNOSIS — Z79899 Other long term (current) drug therapy: Secondary | ICD-10-CM | POA: Insufficient documentation

## 2024-05-16 DIAGNOSIS — I714 Abdominal aortic aneurysm, without rupture, unspecified: Secondary | ICD-10-CM | POA: Insufficient documentation

## 2024-05-16 DIAGNOSIS — R911 Solitary pulmonary nodule: Secondary | ICD-10-CM | POA: Diagnosis not present

## 2024-05-16 DIAGNOSIS — R079 Chest pain, unspecified: Secondary | ICD-10-CM | POA: Diagnosis present

## 2024-05-16 DIAGNOSIS — Z7982 Long term (current) use of aspirin: Secondary | ICD-10-CM | POA: Insufficient documentation

## 2024-05-16 DIAGNOSIS — I700 Atherosclerosis of aorta: Secondary | ICD-10-CM | POA: Diagnosis not present

## 2024-05-16 DIAGNOSIS — I2510 Atherosclerotic heart disease of native coronary artery without angina pectoris: Secondary | ICD-10-CM | POA: Diagnosis not present

## 2024-05-16 DIAGNOSIS — I7121 Aneurysm of the ascending aorta, without rupture: Secondary | ICD-10-CM | POA: Insufficient documentation

## 2024-05-16 DIAGNOSIS — R0789 Other chest pain: Secondary | ICD-10-CM | POA: Diagnosis not present

## 2024-05-16 DIAGNOSIS — F17200 Nicotine dependence, unspecified, uncomplicated: Secondary | ICD-10-CM | POA: Diagnosis not present

## 2024-05-16 LAB — BASIC METABOLIC PANEL WITH GFR
Anion gap: 9 (ref 5–15)
BUN: 14 mg/dL (ref 8–23)
CO2: 29 mmol/L (ref 22–32)
Calcium: 9.7 mg/dL (ref 8.9–10.3)
Chloride: 103 mmol/L (ref 98–111)
Creatinine, Ser: 0.84 mg/dL (ref 0.44–1.00)
GFR, Estimated: >60 mL/min (ref >60)
Glucose, Bld: 111 mg/dL — ABNORMAL HIGH (ref 70–99)
Potassium: 3.9 mmol/L (ref 3.5–5.1)
Sodium: 141 mmol/L (ref 135–145)

## 2024-05-16 LAB — CBC
HCT: 38.9 % (ref 36.0–46.0)
Hemoglobin: 13.1 g/dL (ref 12.0–15.0)
MCH: 31 pg (ref 26.0–34.0)
MCHC: 33.7 g/dL (ref 30.0–36.0)
MCV: 92 fL (ref 80.0–100.0)
Platelets: 175 K/uL (ref 150–400)
RBC: 4.23 MIL/uL (ref 3.87–5.11)
RDW: 13.4 % (ref 11.5–15.5)
WBC: 4.7 K/uL (ref 4.0–10.5)
nRBC: 0 % (ref 0.0–0.2)

## 2024-05-16 LAB — TROPONIN T, HIGH SENSITIVITY
Troponin T High Sensitivity: 18 ng/L (ref 0–19)
Troponin T High Sensitivity: 15 ng/L (ref 0–19)

## 2024-05-16 MED ORDER — NITROGLYCERIN 0.4 MG SL SUBL
0.4000 mg | SUBLINGUAL_TABLET | SUBLINGUAL | Status: DC | PRN
  Administered 2024-05-16: 0.4 mg via SUBLINGUAL
  Filled 2024-05-16: qty 1

## 2024-05-16 MED ORDER — OMEPRAZOLE 20 MG PO CPDR: 20.0000 mg | DELAYED_RELEASE_CAPSULE | Freq: Every day | ORAL | 0 refills | Status: AC

## 2024-05-16 MED ORDER — ALUM & MAG HYDROXIDE-SIMETH 200-200-20 MG/5ML PO SUSP
30.0000 mL | Freq: Once | ORAL | Status: AC
  Administered 2024-05-16: 30 mL via ORAL
  Filled 2024-05-16: qty 30

## 2024-05-16 MED ORDER — ASPIRIN 81 MG PO CHEW
324.0000 mg | CHEWABLE_TABLET | Freq: Once | ORAL | Status: AC
  Administered 2024-05-16: 324 mg via ORAL
  Filled 2024-05-16: qty 4

## 2024-05-16 MED ORDER — IOHEXOL 350 MG/ML SOLN
100.0000 mL | Freq: Once | INTRAVENOUS | Status: AC | PRN
  Administered 2024-05-16: 100 mL via INTRAVENOUS

## 2024-05-16 NOTE — Discharge Instructions (Addendum)
 Your CT scan did not show a dissection, but it did show some things that need to be followed by your PCP.  You have a 4 cm ascending thoracic aorta aneurysm which required yearly imaging by CT scan to make sure it's not growing.  You also have a lung nodule in the left lung which needs a repeat CT chest in 3 months to make sure it's not growing.

## 2024-05-16 NOTE — ED Provider Notes (Signed)
 [facility] Provider Note   CSN: [id number] Arrival date & time: 05/16/24  9085     Patient presents with: Chest Pain   Kim Sutton is a 89 y.o. female.   Pt is a 89 yo female with pmhx significant for CAD, HTN, HLD, ddd, tobacco abuse, kidney stone, and hyperglycemia.  Pt presents to the ED today with cp that woke her up in the night.  She feels like she's been kicked by a horse.  She has some nausea.  No sob.  This feels like prior heart attack.  Pain radiates into her back.       Prior to Admission medications   Medication Sig Start Date End Date Taking? Authorizing Provider  omeprazole (PRILOSEC) 20 MG capsule Take 1 capsule (20 mg total) by mouth daily. 05/16/24  Yes Dean Clarity, MD  aspirin  81 MG tablet Take 324 mg by mouth daily.     [provider]  diclofenac  Sodium (VOLTAREN ) 1 % GEL Apply 2 g topically 4 (four) times daily as needed. 05/30/22   Long, Joshua G, MD  lisinopril (ZESTRIL) 20 MG tablet Take 20 mg by mouth daily.    [provider]  metoprolol succinate (TOPROL-XL) 50 MG 24 hr tablet Take 50 mg by mouth daily. Take with or immediately following a meal.    [provider]  metoprolol tartrate (LOPRESSOR) 50 MG tablet Take 50 mg by mouth 2 (two) times daily.    [provider]  oxyCODONE -acetaminophen  (PERCOCET/ROXICET) 5-325 MG tablet Take 1 tablet by mouth every 6 (six) hours as needed for severe pain. 05/30/22   Long, Fonda MATSU, MD  rosuvastatin  (CRESTOR ) 5 MG tablet Take 1 tablet (5 mg total) by mouth daily. 05/05/22 04/30/23  Kate Lonni CROME, MD  senna-docusate (SENOKOT-S) 8.6-50 MG tablet Take 1 tablet by mouth at bedtime as needed for mild constipation or moderate constipation. 05/30/22   Long, Joshua G, MD  SPIRIVA RESPIMAT 2.5 MCG/ACT AERS INHALE 2 PUFFS ONCE DAILY 90 for 30    [provider]  ZETIA 10 MG tablet  07/22/13   [provider]     Allergies: Ceclor [cefaclor] and Atorvastatin    Review of Systems  Cardiovascular:  Positive for chest pain.  All other systems reviewed and are negative.   Updated Vital Signs BP 131/69   Pulse 64   Temp 97.7 F (36.5 C)   Resp 15   Wt 72.6 kg   SpO2 96%   BMI 25.82 kg/m   Physical Exam Vitals and nursing note reviewed.  Constitutional:      Appearance: She is well-developed.  HENT:     Head: Normocephalic and atraumatic.  Eyes:     Extraocular Movements: Extraocular movements intact.     Pupils: Pupils are equal, round, and reactive to light.  Cardiovascular:     Rate and Rhythm: Normal rate and regular rhythm.     Heart sounds: Normal heart sounds.  Pulmonary:     Effort: Pulmonary effort is normal.     Breath sounds: Normal breath sounds.  Abdominal:     General: Bowel sounds are normal.     Palpations: Abdomen is soft.  Musculoskeletal:        General: Normal range of motion.     Cervical back: Normal range of motion and neck supple.  Skin:    General: Skin is warm.     Capillary Refill: Capillary refill takes less than 2  seconds.  Neurological:     General: No focal deficit present.     Mental Status: She is alert and oriented to person, place, and time.  Psychiatric:        Mood and Affect: Mood normal.        Behavior: Behavior normal.     (all labs ordered are listed, but only abnormal results are displayed) Labs Reviewed  BASIC METABOLIC PANEL WITH GFR - Abnormal; Notable for the following components:      Result Value   Glucose, Bld 111 (*)    All other components within normal limits  CBC  TROPONIN T, HIGH SENSITIVITY  TROPONIN T, HIGH SENSITIVITY    EKG: EKG Interpretation Date/Time:  Thursday May 16 2024 09:28:57 EDT Ventricular Rate:  61 PR Interval:  186 QRS Duration:  140 QT Interval:  461 QTC Calculation: 465 R Axis:   22  Text Interpretation: Sinus rhythm Right bundle branch block No significant change since last  tracing Confirmed by Dean Clarity [phone]) on 05/16/2024 9:33:59 AM  Radiology: CT Angio Chest/Abd/Pel for Dissection W and/or Wo Contrast Result Date: 05/16/2024 CLINICAL DATA:  Central chest pain that radiates to the back. Clinical concern for acute aortic syndrome. EXAM: CT ANGIOGRAPHY CHEST, ABDOMEN AND PELVIS TECHNIQUE: Non-contrast CT of the chest was initially obtained. Abdomen and pelvis CT Multidetector CT imaging through the chest, abdomen and pelvis was performed using the standard protocol during bolus administration of intravenous contrast. Multiplanar reconstructed images and MIPs were obtained and reviewed to evaluate the vascular anatomy. RADIATION DOSE REDUCTION: This exam was performed according to the departmental dose-optimization program which includes automated exposure control, adjustment of the mA and/or kV according to patient size and/or use of iterative reconstruction technique. CONTRAST:  OMNIPAQUE  IOHEXOL  350 MG/ML SOLN COMPARISON:  02/13/2024 FINDINGS: CTA CHEST FINDINGS Cardiovascular: Pre contrast imaging shows no hyperdense crescent in the wall of the thoracic aorta to suggest the presence of an acute intramural hematoma. Coronary artery calcification is evident. Mild atherosclerotic calcification is noted in the wall of the thoracic aorta. Ascending thoracic aorta measures 4 cm diameter. Imaging after IV contrast administration reveals no dissection flap in the thoracic aorta. Pulmonary arteries are well opacified without CT evidence for acute pulmonary embolus Mediastinum/Nodes: No mediastinal lymphadenopathy. There is no hilar lymphadenopathy. The esophagus has normal imaging features. There is no axillary lymphadenopathy. Lungs/Pleura: Centrilobular and paraseptal emphysema evident. Biapical pleuroparenchymal scarring includes an asymmetric 17 mm irregular nodular component in the left apex on image 17 of series 18. No other overtly suspicious pulmonary nodule or  mass. No focal airspace consolidation. There is no evidence of pleural effusion. Musculoskeletal: No worrisome lytic or sclerotic osseous abnormality. Review of the MIP images confirms the above findings. CTA ABDOMEN AND PELVIS FINDINGS VASCULAR Aorta: Juxtarenal abdominal aorta measures up to 3.0 cm diameter. No dissection of the abdominal aorta. No stenosis. Moderate atherosclerotic disease. Celiac: Patent without evidence of aneurysm, dissection, vasculitis or significant stenosis. SMA: Patent without evidence of aneurysm, dissection, vasculitis or significant stenosis. 2 Renals: Both renal arteries are patent without evidence of aneurysm, dissection, vasculitis, fibromuscular dysplasia or significant stenosis. IMA: Patent without evidence of aneurysm, dissection, vasculitis or significant stenosis. Inflow: Patent without evidence of aneurysm, dissection, vasculitis or significant stenosis. Veins: No obvious venous abnormality within the limitations of this arterial phase study. Review of the MIP images confirms the above findings. NON-VASCULAR Hepatobiliary: Hepatic cyst in the medial segment left liver stable back to a study from 02/13/2020. Gallbladder  is surgically absent. No intrahepatic or extrahepatic biliary dilation. Pancreas: No focal mass lesion. No dilatation of the main duct. No intraparenchymal cyst. No peripancreatic edema. Spleen: No splenomegaly. No suspicious focal mass lesion. Adrenals/Urinary Tract: Right adrenal gland unremarkable. 2.6 cm left adrenal nodule stable since the 2021 exam, compatible with benign etiology such as lipid poor adenoma. Bilateral renal cysts again noted. No evidence for hydroureter. The urinary bladder appears normal for the degree of distention. Stomach/Bowel: Stomach is unremarkable. No gastric wall thickening. No evidence of outlet obstruction. Duodenum is normally positioned as is the ligament of Treitz. No small bowel wall thickening. No small bowel dilatation.  The terminal ileum is normal. The appendix is not well visualized, but there is no edema or inflammation in the region of the cecal tip to suggest appendicitis. No gross colonic mass. No colonic wall thickening. Diverticular changes are noted in the left colon without evidence of diverticulitis. Lymphatic: No abdominal lymphadenopathy 2.1 x 1.9 cm bilobar tubular low-density aortocaval lesion is identified in the abdomen, stable since CT of 02/13/2024. This was present on a study from 01/24/2020 but measured approximately 1.4 x 0.9 cm at that time. As noted previously, this is most suggestive of a prominent cisterna chyli. No pelvic sidewall lymphadenopathy. Reproductive: Hysterectomy.  There is no adnexal mass. Other: No intraperitoneal free fluid. Musculoskeletal: No worrisome lytic or sclerotic osseous abnormality. Review of the MIP images confirms the above findings. IMPRESSION: 1. No evidence for acute intramural hematoma or dissection of the thoracoabdominal aorta. 2. 4.0 cm ascending thoracic aortic aneurysm. Recommend annual imaging followup by CTA or MRA. This recommendation follows 2010 ACCF/AHA/AATS/ACR/ASA/SCA/SCAI/SIR/STS/SVM Guidelines for the Diagnosis and Management of Patients with Thoracic Aortic Disease. Circulation. 2010; 121: Z733-z630. Aortic aneurysm NOS (ICD10-I71.9) 3. 2.6 cm left adrenal nodule stable since the 2021 exam, compatible with benign etiology such as lipid poor adenoma. 4. Biapical pleuroparenchymal scarring includes an asymmetric 17 mm irregular nodular component in the left apex. Given asymmetry of this finding, follow-up warranted. Repeat CT chest in 3 months could be used to ensure stability. Alternatively, PET-CT to assess for hypermetabolism may prove helpful. Nonemergent pulmonology consultation suggested. 5. Aortic Atherosclerosis (ICD10-I70.0) and Emphysema (ICD10-J43.9). Electronically Signed   By: Camellia Candle M.D.   On: 05/16/2024 11:48     Procedures    Medications Ordered in the ED  nitroGLYCERIN (NITROSTAT) SL tablet 0.4 mg (0.4 mg Sublingual Given 05/16/24 0947)  aspirin  chewable tablet 324 mg (324 mg Oral Given 05/16/24 0946)  iohexol  (OMNIPAQUE ) 350 MG/ML injection 100 mL (100 mLs Intravenous Contrast Given 05/16/24 1056)  alum & mag hydroxide-simeth (MAALOX/MYLANTA) 200-200-20 MG/5ML suspension 30 mL (30 mLs Oral Given 05/16/24 1143)                                    Medical Decision Making Amount and/or Complexity of Data Reviewed Labs: ordered. Radiology: ordered.  Risk OTC drugs. Prescription drug management.   This patient presents to the ED for concern of cp, this involves an extensive number of treatment options, and is a complaint that carries with it a high risk of complications and morbidity.  The differential diagnosis includes cardiac, pulm, gi, msk   Co morbidities that complicate the patient evaluation  CAD, HTN, HLD, ddd, tobacco abuse, kidney stone, and hyperglycemia   Additional history obtained:  Additional history obtained from epic chart review External records from outside source obtained and reviewed including  husband   Lab Tests:  I Ordered, and personally interpreted labs.  The pertinent results include:  cbc nl, bmp nl, trop nl times 2   Imaging Studies ordered:  I ordered imaging studies including cta chest  I independently visualized and interpreted imaging which showed  . No evidence for acute intramural hematoma or dissection of the  thoracoabdominal aorta.  2. 4.0 cm ascending thoracic aortic aneurysm. Recommend annual  imaging followup by CTA or MRA. This recommendation follows 2010  ACCF/AHA/AATS/ACR/ASA/SCA/SCAI/SIR/STS/SVM Guidelines for the  Diagnosis and Management of Patients with Thoracic Aortic Disease.  Circulation. 2010; 121: [id number]. Aortic aneurysm NOS (ICD10-I71.9)  3. 2.6 cm left adrenal nodule stable since the 2021 exam, compatible  with benign etiology such  as lipid poor adenoma.  4. Biapical pleuroparenchymal scarring includes an asymmetric 17 mm  irregular nodular component in the left apex. Given asymmetry of  this finding, follow-up warranted. Repeat CT chest in 3 months could  be used to ensure stability. Alternatively, PET-CT to assess for  hypermetabolism may prove helpful. Nonemergent pulmonology  consultation suggested.  5. Aortic Atherosclerosis (ICD10-I70.0) and Emphysema (ICD10-J43.9).   I agree with the radiologist interpretation   Cardiac Monitoring:  The patient was maintained on a cardiac monitor.  I personally viewed and interpreted the cardiac monitored which showed an underlying rhythm of: nsr   Medicines ordered and prescription drug management:  I ordered medication including nitroglycerin/gi cocktail/asa  for sx  Reevaluation of the patient after these medicines showed that the patient improved I have reviewed the patients home medicines and have made adjustments as needed   Test Considered:  ct   Problem List / ED Course:  CP:  EKG and trop times 2 neg.  Cta without dissection.  Nitroglycerin helped BP a little, but not a lot.  GI cocktail relieved pain.  Pt told of abn on CT and the need for f/u.  She has an appt with her cardiologist in the next month.  Pt is stable for d/c.  Return if worse.    Reevaluation:  After the interventions noted above, I reevaluated the patient and found that they have :improved   Social Determinants of Health:  Lives at home   Dispostion:  After consideration of the diagnostic results and the patients response to treatment, I feel that the patent would benefit from discharge with outpatient f/u.       Final diagnoses:  Atypical chest pain  Aneurysm of ascending aorta without rupture  Pulmonary nodule    ED Discharge Orders          Ordered    omeprazole (PRILOSEC) 20 MG capsule  Daily        05/16/24 1237               Dean Clarity,  MD 05/16/24 1240

## 2024-05-16 NOTE — ED Triage Notes (Addendum)
 Central chest pain that has kept her up all night. Pain goes through to the back.  Stating feels like she has been kicked by horse Denies SOB, Nausea  Very HOH

## 2024-05-16 NOTE — ED Notes (Signed)
 Pt alert and oriented X 4 at the time of discharge. RR even and unlabored. No acute distress noted. Pt verbalized understanding of discharge instructions as discussed. Pt transferred in wheelchair to lobby at time of discharge.

## 2024-05-18 DIAGNOSIS — L0100 Impetigo, unspecified: Secondary | ICD-10-CM | POA: Diagnosis not present

## 2024-05-30 DIAGNOSIS — J449 Chronic obstructive pulmonary disease, unspecified: Secondary | ICD-10-CM | POA: Diagnosis not present

## 2024-05-30 DIAGNOSIS — I700 Atherosclerosis of aorta: Secondary | ICD-10-CM | POA: Diagnosis not present

## 2024-05-30 DIAGNOSIS — I10 Essential (primary) hypertension: Secondary | ICD-10-CM | POA: Diagnosis not present

## 2024-05-31 DIAGNOSIS — E7800 Pure hypercholesterolemia, unspecified: Secondary | ICD-10-CM | POA: Diagnosis not present

## 2024-05-31 DIAGNOSIS — J449 Chronic obstructive pulmonary disease, unspecified: Secondary | ICD-10-CM | POA: Diagnosis not present

## 2024-05-31 DIAGNOSIS — M810 Age-related osteoporosis without current pathological fracture: Secondary | ICD-10-CM | POA: Diagnosis not present

## 2024-05-31 DIAGNOSIS — I700 Atherosclerosis of aorta: Secondary | ICD-10-CM | POA: Diagnosis not present

## 2024-05-31 DIAGNOSIS — I10 Essential (primary) hypertension: Secondary | ICD-10-CM | POA: Diagnosis not present

## 2024-06-19 DIAGNOSIS — R030 Elevated blood-pressure reading, without diagnosis of hypertension: Secondary | ICD-10-CM | POA: Diagnosis not present

## 2024-06-19 DIAGNOSIS — L0390 Cellulitis, unspecified: Secondary | ICD-10-CM | POA: Diagnosis not present

## 2024-06-21 DIAGNOSIS — L089 Local infection of the skin and subcutaneous tissue, unspecified: Secondary | ICD-10-CM | POA: Diagnosis not present

## 2024-06-21 DIAGNOSIS — I160 Hypertensive urgency: Secondary | ICD-10-CM | POA: Diagnosis not present

## 2024-06-21 DIAGNOSIS — L720 Epidermal cyst: Secondary | ICD-10-CM | POA: Diagnosis not present

## 2024-06-29 DIAGNOSIS — I10 Essential (primary) hypertension: Secondary | ICD-10-CM | POA: Diagnosis not present

## 2024-06-29 DIAGNOSIS — J449 Chronic obstructive pulmonary disease, unspecified: Secondary | ICD-10-CM | POA: Diagnosis not present

## 2024-06-29 DIAGNOSIS — I700 Atherosclerosis of aorta: Secondary | ICD-10-CM | POA: Diagnosis not present

## 2024-06-30 DIAGNOSIS — M810 Age-related osteoporosis without current pathological fracture: Secondary | ICD-10-CM | POA: Diagnosis not present

## 2024-06-30 DIAGNOSIS — J449 Chronic obstructive pulmonary disease, unspecified: Secondary | ICD-10-CM | POA: Diagnosis not present

## 2024-06-30 DIAGNOSIS — I10 Essential (primary) hypertension: Secondary | ICD-10-CM | POA: Diagnosis not present

## 2024-06-30 DIAGNOSIS — E7800 Pure hypercholesterolemia, unspecified: Secondary | ICD-10-CM | POA: Diagnosis not present

## 2024-06-30 DIAGNOSIS — I700 Atherosclerosis of aorta: Secondary | ICD-10-CM | POA: Diagnosis not present

## 2024-07-06 ENCOUNTER — Emergency Department (HOSPITAL_BASED_OUTPATIENT_CLINIC_OR_DEPARTMENT_OTHER)
Admission: EM | Admit: 2024-07-06 | Discharge: 2024-07-06 | Disposition: A | Discharge status: Home / Self Care | Attending: Emergency Medicine | Admitting: Emergency Medicine

## 2024-07-06 ENCOUNTER — Encounter (HOSPITAL_BASED_OUTPATIENT_CLINIC_OR_DEPARTMENT_OTHER): Payer: Self-pay | Admitting: Urology

## 2024-07-06 ENCOUNTER — Other Ambulatory Visit: Payer: Self-pay

## 2024-07-06 ENCOUNTER — Emergency Department (HOSPITAL_BASED_OUTPATIENT_CLINIC_OR_DEPARTMENT_OTHER)

## 2024-07-06 DIAGNOSIS — S199XXA Unspecified injury of neck, initial encounter: Secondary | ICD-10-CM | POA: Diagnosis not present

## 2024-07-06 DIAGNOSIS — M50322 Other cervical disc degeneration at C5-C6 level: Secondary | ICD-10-CM | POA: Diagnosis not present

## 2024-07-06 DIAGNOSIS — M4802 Spinal stenosis, cervical region: Secondary | ICD-10-CM | POA: Diagnosis not present

## 2024-07-06 DIAGNOSIS — R1022 Pelvic and perineal pain left side: Secondary | ICD-10-CM | POA: Diagnosis not present

## 2024-07-06 DIAGNOSIS — S8002XA Contusion of left knee, initial encounter: Secondary | ICD-10-CM | POA: Diagnosis not present

## 2024-07-06 DIAGNOSIS — M25562 Pain in left knee: Secondary | ICD-10-CM | POA: Diagnosis not present

## 2024-07-06 DIAGNOSIS — R220 Localized swelling, mass and lump, head: Secondary | ICD-10-CM | POA: Diagnosis not present

## 2024-07-06 DIAGNOSIS — S60222A Contusion of left hand, initial encounter: Secondary | ICD-10-CM | POA: Diagnosis not present

## 2024-07-06 DIAGNOSIS — M25532 Pain in left wrist: Secondary | ICD-10-CM | POA: Diagnosis not present

## 2024-07-06 DIAGNOSIS — Y92481 Parking lot as the place of occurrence of the external cause: Secondary | ICD-10-CM | POA: Diagnosis not present

## 2024-07-06 DIAGNOSIS — W0110XA Fall on same level from slipping, tripping and stumbling with subsequent striking against unspecified object, initial encounter: Secondary | ICD-10-CM | POA: Diagnosis not present

## 2024-07-06 DIAGNOSIS — M47812 Spondylosis without myelopathy or radiculopathy, cervical region: Secondary | ICD-10-CM | POA: Diagnosis not present

## 2024-07-06 DIAGNOSIS — S5002XA Contusion of left elbow, initial encounter: Secondary | ICD-10-CM | POA: Diagnosis not present

## 2024-07-06 DIAGNOSIS — R079 Chest pain, unspecified: Secondary | ICD-10-CM | POA: Diagnosis not present

## 2024-07-06 DIAGNOSIS — M25522 Pain in left elbow: Secondary | ICD-10-CM | POA: Diagnosis not present

## 2024-07-06 DIAGNOSIS — S40012A Contusion of left shoulder, initial encounter: Secondary | ICD-10-CM | POA: Diagnosis not present

## 2024-07-06 DIAGNOSIS — S0993XA Unspecified injury of face, initial encounter: Secondary | ICD-10-CM | POA: Diagnosis not present

## 2024-07-06 DIAGNOSIS — I700 Atherosclerosis of aorta: Secondary | ICD-10-CM | POA: Diagnosis not present

## 2024-07-06 DIAGNOSIS — M25572 Pain in left ankle and joints of left foot: Secondary | ICD-10-CM | POA: Diagnosis not present

## 2024-07-06 DIAGNOSIS — Z7982 Long term (current) use of aspirin: Secondary | ICD-10-CM | POA: Diagnosis not present

## 2024-07-06 DIAGNOSIS — Y9301 Activity, walking, marching and hiking: Secondary | ICD-10-CM | POA: Diagnosis not present

## 2024-07-06 DIAGNOSIS — R519 Headache, unspecified: Secondary | ICD-10-CM | POA: Diagnosis present

## 2024-07-06 DIAGNOSIS — R600 Localized edema: Secondary | ICD-10-CM | POA: Diagnosis not present

## 2024-07-06 DIAGNOSIS — I10 Essential (primary) hypertension: Secondary | ICD-10-CM | POA: Diagnosis not present

## 2024-07-06 DIAGNOSIS — M19072 Primary osteoarthritis, left ankle and foot: Secondary | ICD-10-CM | POA: Diagnosis not present

## 2024-07-06 DIAGNOSIS — Z79899 Other long term (current) drug therapy: Secondary | ICD-10-CM | POA: Diagnosis not present

## 2024-07-06 DIAGNOSIS — M1712 Unilateral primary osteoarthritis, left knee: Secondary | ICD-10-CM | POA: Diagnosis not present

## 2024-07-06 DIAGNOSIS — S0990XA Unspecified injury of head, initial encounter: Secondary | ICD-10-CM | POA: Diagnosis not present

## 2024-07-06 DIAGNOSIS — S0083XA Contusion of other part of head, initial encounter: Secondary | ICD-10-CM | POA: Diagnosis not present

## 2024-07-06 DIAGNOSIS — S0012XA Contusion of left eyelid and periocular area, initial encounter: Secondary | ICD-10-CM | POA: Diagnosis not present

## 2024-07-06 DIAGNOSIS — M19032 Primary osteoarthritis, left wrist: Secondary | ICD-10-CM | POA: Diagnosis not present

## 2024-07-06 DIAGNOSIS — W19XXXA Unspecified fall, initial encounter: Secondary | ICD-10-CM

## 2024-07-06 DIAGNOSIS — M25512 Pain in left shoulder: Secondary | ICD-10-CM | POA: Diagnosis not present

## 2024-07-06 DIAGNOSIS — M79642 Pain in left hand: Secondary | ICD-10-CM | POA: Diagnosis not present

## 2024-07-06 NOTE — ED Provider Notes (Signed)
 [place] [facility] Provider Note   CSN: [id number] Arrival date & time: 07/06/24  1513     Patient presents with: Kim Sutton   CRISS BARTLES is a 89 y.o. female.   Patient is here for evaluation after mechanical fall that occurred yesterday.  Patient is accompanied by her husband today.  She was walking in the parking lot when she tripped over a crack in the pavement.  She fell to her left side.  This fall was witnessed by her husband.  There was no loss of consciousness.  Patient is not taking a blood thinner.  Patient is complaining of left-sided face pain by the eye, left shoulder pain, left elbow pain, left wrist and hand pain, left knee pain, and left ankle pain.  She has been able to ambulate and bear weight since the incident.  She is a bit concerned as she has some new blurred vision in the left eye.  She denies nausea, vomiting, fainting, or confusion since the incident.  Patient took Tylenol  this morning which has improved her pain.  She is not interested in additional pain medication at this time.  Of note, patient is hard of hearing.  The history is provided by the patient and the spouse.  Fall This is a new problem.       Prior to Admission medications   Medication Sig Start Date End Date Taking? Authorizing Provider  aspirin  81 MG tablet Take 324 mg by mouth daily.     [provider]  diclofenac  Sodium (VOLTAREN ) 1 % GEL Apply 2 g topically 4 (four) times daily as needed. 05/30/22   Long, Joshua G, MD  lisinopril (ZESTRIL) 20 MG tablet Take 20 mg by mouth daily.    [provider]  metoprolol succinate (TOPROL-XL) 50 MG 24 hr tablet Take 50 mg by mouth daily. Take with or immediately following a meal.    [provider]  metoprolol tartrate (LOPRESSOR) 50 MG tablet Take 50 mg by mouth 2 (two) times daily.    [provider]  omeprazole  (PRILOSEC) 20 MG capsule Take 1 capsule (20 mg total) by mouth  daily. 05/16/24   Dean Clarity, MD  oxyCODONE -acetaminophen  (PERCOCET/ROXICET) 5-325 MG tablet Take 1 tablet by mouth every 6 (six) hours as needed for severe pain. 05/30/22   Long, Fonda MATSU, MD  rosuvastatin  (CRESTOR ) 5 MG tablet Take 1 tablet (5 mg total) by mouth daily. 05/05/22 04/30/23  Kate Lonni CROME, MD  senna-docusate (SENOKOT-S) 8.6-50 MG tablet Take 1 tablet by mouth at bedtime as needed for mild constipation or moderate constipation. 05/30/22   Long, Joshua G, MD  SPIRIVA RESPIMAT 2.5 MCG/ACT AERS INHALE 2 PUFFS ONCE DAILY 90 for 30    [provider]  ZETIA 10 MG tablet  07/22/13   [provider]    Allergies: Ceclor [cefaclor] and Atorvastatin    Review of Systems  Skin:  Positive for wound.    Updated Vital Signs BP (!) 209/74 (BP Location: Left Arm)   Pulse 63   Temp 97.7 F (36.5 C) (Oral)   Resp 17   Ht 5' 6 (1.676 m)   Wt 72.6 kg   SpO2 95%   BMI 25.83 kg/m   Physical Exam Vitals and nursing note reviewed.  Constitutional:      Appearance: Normal appearance.  HENT:     Head: Normocephalic.     Comments: Swelling and ecchymosis around the left eye.  No obvious  injuries or tenderness to the rear of head.    Nose: Nose normal.     Mouth/Throat:     Mouth: Mucous membranes are moist.  Eyes:     General: No scleral icterus.    Extraocular Movements: Extraocular movements intact.     Conjunctiva/sclera: Conjunctivae normal.     Pupils: Pupils are equal, round, and reactive to light.  Cardiovascular:     Rate and Rhythm: Normal rate and regular rhythm.     Pulses: Normal pulses.     Heart sounds: Normal heart sounds.  Pulmonary:     Effort: Pulmonary effort is normal. No respiratory distress.     Breath sounds: Normal breath sounds. No stridor. No wheezing, rhonchi or rales.  Abdominal:     General: Abdomen is flat. Bowel sounds are normal. There is no distension.     Palpations: Abdomen is soft.     Tenderness: There is no  abdominal tenderness. There is no guarding.  Musculoskeletal:        General: Tenderness present. No deformity or signs of injury. Normal range of motion.     Right lower leg: No edema.     Left lower leg: No edema.     Comments: No pain with palpation of spinal processes or paraspinal regions.  Skin:    General: Skin is warm and dry.     Capillary Refill: Capillary refill takes less than 2 seconds.     Coloration: Skin is not jaundiced or pale.     Findings: Bruising present.     Comments: Bruising over the left eye, left shoulder, left elbow, dorsal aspect of left hand, left knee.  No obvious crepitus or deformities at these areas though they are tender to palpation.  No obvious bruising or deformities to the torso, hip, or left foot.  Right side of body is unremarkable for injuries, tenderness, or bruising.  Neurological:     Mental Status: She is alert and oriented to person, place, and time.     (all labs ordered are listed, but only abnormal results are displayed) Labs Reviewed - No data to display  EKG: None  Radiology: DG Elbow Complete Left Result Date: 07/06/2024 EXAM: 3 VIEW(S) XRAY OF THE LEFT ELBOW COMPARISON: None available. CLINICAL HISTORY: Pain secondary to fall. FINDINGS: BONES AND JOINTS: No acute fracture. No malalignment. SOFT TISSUES: The soft tissues are unremarkable. IMPRESSION: 1. No significant abnormality. Electronically signed by: Franky Crease MD 07/06/2024 05:19 PM EST RP Workstation: [id number]   DG Hand Complete Left Result Date: 07/06/2024 EXAM: 3 or more VIEW(S) XRAY OF THE LEFT HAND 07/06/2024 04:53:00 PM COMPARISON: None available. CLINICAL HISTORY: pain secondary to fall FINDINGS: BONES AND JOINTS: No acute fracture. No malalignment. Degenerative changes are present throughout the interphalangeal (IP) joints and in the left wrist. SOFT TISSUES: The soft tissues are unremarkable. IMPRESSION: 1. No acute fracture or dislocation. Electronically signed by:  Franky Crease MD 07/06/2024 05:18 PM EST RP Workstation: [id number]   DG Shoulder Left Result Date: 07/06/2024 EXAM: 1 VIEW(S) XRAY OF THE LEFT SHOULDER 07/06/2024 04:53:00 PM COMPARISON: None available. CLINICAL HISTORY: Pain secondary to fall Pain secondary to fall FINDINGS: BONES AND JOINTS: Glenohumeral joint is normally aligned. No acute fracture. No malalignment. The [facility] joint is unremarkable. SOFT TISSUES: No abnormal calcifications. Visualized lung is unremarkable. IMPRESSION: 1. No significant abnormality. Electronically signed by: Franky Crease MD 07/06/2024 05:18 PM EST RP Workstation: [id number]   CT Cervical Spine Wo Contrast Result Date: 07/06/2024 EXAM: CT  CERVICAL SPINE WITHOUT CONTRAST 07/06/2024 05:09:56 PM TECHNIQUE: CT of the cervical spine was performed without the administration of intravenous contrast. Multiplanar reformatted images are provided for review. Automated exposure control, iterative reconstruction, and/or weight based adjustment of the mA/kV was utilized to reduce the radiation dose to as low as reasonably achievable. COMPARISON: 12/21/2021 CLINICAL HISTORY: Neck trauma (Age >= 65y) FINDINGS: CERVICAL SPINE: BONES AND ALIGNMENT: No acute fracture or traumatic malalignment. DEGENERATIVE CHANGES: Degenerative disc disease at C5-C6 and C6-C7 with disc space narrowing and spurring. Moderate to advanced degenerative facet disease bilaterally, left greater than right. SOFT TISSUES: No prevertebral soft tissue swelling. LUNGS: Centrilobular and paraseptal emphysema noted in the apices. Biapical scarring. IMPRESSION: 1. No acute abnormality of the cervical spine related to the reported neck trauma. 2. Multilevel degenerative changes. Electronically signed by: Franky Crease MD 07/06/2024 05:17 PM EST RP Workstation: [id number]   CT Maxillofacial WO CM Result Date: 07/06/2024 EXAM: CT OF THE FACE WITHOUT CONTRAST 07/06/2024 05:09:56 PM TECHNIQUE: CT of the face was performed without the  administration of intravenous contrast. Multiplanar reformatted images are provided for review. Automated exposure control, iterative reconstruction, and/or weight based adjustment of the mA/kV was utilized to reduce the radiation dose to as low as reasonably achievable. COMPARISON: None available. CLINICAL HISTORY: Facial trauma, blunt. FINDINGS: FACIAL BONES: No acute facial fracture. No mandibular dislocation. No suspicious bone lesion. ORBITS: Globes are intact. No acute traumatic injury. No inflammatory change. SINUSES AND MASTOIDS: No acute abnormality. SOFT TISSUES: Soft tissue swelling over the left forehead and orbit. IMPRESSION: 1. No acute facial fracture. 2. Soft tissue swelling over the left forehead and orbit. Electronically signed by: Franky Crease MD 07/06/2024 05:15 PM EST RP Workstation: [id number]   DG Ankle Complete Left Result Date: 07/06/2024 CLINICAL DATA:  Pain, fell EXAM: LEFT ANKLE COMPLETE - 3+ VIEW COMPARISON:  None Available. FINDINGS: Frontal, oblique, and lateral views of the left ankle are obtained. No acute fracture, subluxation, or dislocation. Mild osteoarthritis of the tibiotalar joint. There is diffuse subcutaneous edema throughout the visualized left lower extremity, ankle, and hindfoot. IMPRESSION: 1. Diffuse subcutaneous edema. 2. No acute bony abnormality. Electronically Signed   By: Ozell Daring M.D.   On: 07/06/2024 17:14   CT Head Wo Contrast Result Date: 07/06/2024 EXAM: CT HEAD WITHOUT CONTRAST 07/06/2024 05:09:56 PM TECHNIQUE: CT of the head was performed without the administration of intravenous contrast. Automated exposure control, iterative reconstruction, and/or weight based adjustment of the mA/kV was utilized to reduce the radiation dose to as low as reasonably achievable. COMPARISON: 10/11/2023 CLINICAL HISTORY: Head trauma, moderate-severe FINDINGS: BRAIN AND VENTRICLES: No acute hemorrhage. No evidence of acute infarct. No hydrocephalus. No extra-axial  collection. No mass effect or midline shift. Atrophy and chronic small vessel disease throughout the deep white matter. ORBITS: No acute abnormality. SINUSES: No acute abnormality. SOFT TISSUES AND SKULL: Soft tissue swelling over the left forehead. No skull fracture. IMPRESSION: 1. No acute intracranial abnormality. 2. Soft tissue swelling over the left forehead. Electronically signed by: Franky Crease MD 07/06/2024 05:13 PM EST RP Workstation: [id number]   DG Hip Unilat With Pelvis 2-3 Views Left Result Date: 07/06/2024 CLINICAL DATA:  Left posterior pelvic pain after fall yesterday EXAM: DG HIP (WITH OR WITHOUT PELVIS) 2-3V LEFT COMPARISON:  None Available. FINDINGS: Frontal view of the pelvis as well as frontal and frogleg lateral views of the left hip are obtained. No acute displaced fracture, subluxation, or dislocation. Mild symmetrical bilateral hip osteoarthritis. Sacroiliac joints are unremarkable. Degenerative  changes at the lumbosacral junction. IMPRESSION: 1. No acute displaced fracture. 2. Degenerative changes of the lumbar spine and bilateral hips. Electronically Signed   By: Ozell Daring M.D.   On: 07/06/2024 17:13   DG Wrist Complete Left Result Date: 07/06/2024 EXAM: 3 OR MORE VIEW(S) XRAY OF THE LEFT WRIST 07/06/2024 04:53:00 PM COMPARISON: None available. CLINICAL HISTORY: pain secondary to fall FINDINGS: BONES AND JOINTS: Degenerative changes in the left wrist. No acute fracture. No malalignment. SOFT TISSUES: The soft tissues are unremarkable. IMPRESSION: 1. No acute fracture or dislocation. Electronically signed by: Franky Crease MD 07/06/2024 05:12 PM EST RP Workstation: [id number]   DG Knee Complete 4 Views Left Result Date: 07/06/2024 CLINICAL DATA:  Clemens, pain EXAM: LEFT KNEE - COMPLETE 4+ VIEW COMPARISON:  05/16/2022 FINDINGS: Frontal, bilateral oblique, and lateral views of the left knee are obtained. No acute fracture, subluxation, or dislocation. Mild 3 compartmental  osteoarthritis. No joint effusion. Soft tissues are unremarkable. IMPRESSION: 1. Mild osteoarthritis.  No acute fracture. Electronically Signed   By: Ozell Daring M.D.   On: 07/06/2024 17:12   DG Chest 2 View Result Date: 07/06/2024 EXAM: 2 VIEW(S) XRAY OF THE CHEST 07/06/2024 04:53:00 PM COMPARISON: 12/18/2023. Aortic atherosclerosis. CLINICAL HISTORY: fall with pain FINDINGS: LUNGS AND PLEURA: No focal pulmonary opacity. No pleural effusion. No pneumothorax. HEART AND MEDIASTINUM: Aortic atherosclerosis. No acute abnormality of the cardiac and mediastinal silhouettes. BONES AND SOFT TISSUES: No acute osseous abnormality. IMPRESSION: 1. No acute cardiopulmonary process. Electronically signed by: Franky Crease MD 07/06/2024 05:10 PM EST RP Workstation: [id number]     Procedures   Medications Ordered in the ED - No data to display   Patient presents to the ED for concern of pain and blurred vision after mechanical fall yesterday, this involves an extensive number of treatment options, and is a complaint that carries with it a high risk of complications and morbidity.  The differential diagnosis includes intracranial hemorrhage, fracture, dislocation, soft tissue injury,   Co morbidities that complicate the patient evaluation  Elderly age   Additional history obtained:  Additional history obtained from  [facility]   External records from outside source obtained and reviewed including history provided by husband.   Imaging Studies ordered:  I ordered imaging studies including multiple x-rays and CTs, see below: CT cervical spine without contrast shows no acute abnormalities. CT head without contrast shows no acute intracranial abnormality.  It does show soft tissue swelling over the left forehead. CT maxillofacial without contrast shows no acute facial fracture.  It does show soft tissue swelling over the left forehead and orbit. Chest x-ray shows no acute cardiopulmonary process or acute  osseous abnormality. X-ray of hip shows no acute displaced fracture. Left knee x-ray shows no acute fracture.  Mild osteoarthritis. Left ankle x-ray shows no acute bony abnormality.  There is diffuse subcutaneous edema throughout the visualized left lower extremity, ankle, and hindfoot. Left hand and left wrist x-ray shows no acute fracture or dislocation. Left elbow shows no acute fracture or abnormality. Left shoulder shows no acute fracture, malalignment, or significant abnormality.   Medicines ordered and prescription drug management:  Patient was offered pain medication however patient declined.   Problem List / ED Course:  Pain and blurred vision after mechanical fall.  Imaging is largely unremarkable with no seen acute fractures, dislocations, or significant abnormalities.  There is soft tissue swelling over the left forehead and orbit which correlates with physical exam findings.*** Hypertension.  Patient's blood pressure remained elevated  during today's visit.  She is prescribed lisinopril and metoprolol which she takes at night.  She has not missed any recent doses and denies any recent changes in these medications.  They do check her blood pressure at home and it has been within normal range.   Reevaluation:  After the interventions noted above, I reevaluated the patient and found that they have :improved   Dispostion:  After consideration of the diagnostic results and the patients response to treatment, I feel that the patent would benefit from ***.    Medical Decision Making Amount and/or Complexity of Data Reviewed Radiology: ordered.   This note was produced using [employer] Engineer. While the provider has reviewed and verified all clinical information, transcription errors may remain.     Final diagnoses:  None    ED Discharge Orders     None

## 2024-07-06 NOTE — ED Notes (Signed)
 Patient transported to radiology

## 2024-07-06 NOTE — ED Triage Notes (Signed)
 Pt states fall yesterday, tripped in parking lot  Facial brusing to left eye noted   Hit face on left side and left arm pain.  States shoulder, elbow, hand,and knee pain   Pt ambulatory with cane  No blood thinner

## 2024-07-06 NOTE — Discharge Instructions (Addendum)
 It was a pleasure meeting with you today.  As we discussed your imaging showed no acute intracranial hemorrhage, fractures, or dislocations.    Your blood pressure was elevated during your visit today.  I would recommend going home and taking your blood pressure medication as you do nightly and rechecking your blood pressure to ensure that it goes down to a normal range.  Keep a log of your blood pressures and follow-up with your primary care provider as you may need additional blood pressure agents.  Please return if you develop severe headache, slurred speech, confusion, loss of consciousness, chest pain, shortness of breath, or any other concerning symptoms.  Please use Tylenol  or ibuprofen for pain.  You may use 600 mg ibuprofen every 6 hours or 1000 mg of Tylenol  every 6 hours.  You may choose to alternate between the 2.  This would be most effective.  Not to exceed 4 g of Tylenol  within 24 hours.  Not to exceed 3200 mg ibuprofen 24 hours.

## 2024-07-17 DIAGNOSIS — R7303 Prediabetes: Secondary | ICD-10-CM | POA: Diagnosis not present

## 2024-07-17 DIAGNOSIS — Z1331 Encounter for screening for depression: Secondary | ICD-10-CM | POA: Diagnosis not present

## 2024-07-17 DIAGNOSIS — Z23 Encounter for immunization: Secondary | ICD-10-CM | POA: Diagnosis not present

## 2024-07-17 DIAGNOSIS — E7800 Pure hypercholesterolemia, unspecified: Secondary | ICD-10-CM | POA: Diagnosis not present

## 2024-07-17 DIAGNOSIS — Z0000 Encounter for general adult medical examination without abnormal findings: Secondary | ICD-10-CM | POA: Diagnosis not present

## 2024-07-17 DIAGNOSIS — E559 Vitamin D deficiency, unspecified: Secondary | ICD-10-CM | POA: Diagnosis not present

## 2024-08-14 ENCOUNTER — Emergency Department (HOSPITAL_BASED_OUTPATIENT_CLINIC_OR_DEPARTMENT_OTHER)

## 2024-08-14 ENCOUNTER — Encounter (HOSPITAL_BASED_OUTPATIENT_CLINIC_OR_DEPARTMENT_OTHER): Payer: Self-pay | Admitting: Emergency Medicine

## 2024-08-14 ENCOUNTER — Other Ambulatory Visit: Payer: Self-pay

## 2024-08-14 ENCOUNTER — Observation Stay (HOSPITAL_BASED_OUTPATIENT_CLINIC_OR_DEPARTMENT_OTHER)
Admission: EM | Admit: 2024-08-14 | Discharge: 2024-08-15 | Disposition: A | Discharge status: Home / Self Care | Attending: Family Medicine | Admitting: Family Medicine

## 2024-08-14 DIAGNOSIS — R1084 Generalized abdominal pain: Secondary | ICD-10-CM | POA: Diagnosis not present

## 2024-08-14 DIAGNOSIS — I10 Essential (primary) hypertension: Secondary | ICD-10-CM | POA: Diagnosis not present

## 2024-08-14 DIAGNOSIS — R0902 Hypoxemia: Secondary | ICD-10-CM

## 2024-08-14 DIAGNOSIS — Z87891 Personal history of nicotine dependence: Secondary | ICD-10-CM | POA: Insufficient documentation

## 2024-08-14 DIAGNOSIS — Z889 Allergy status to unspecified drugs, medicaments and biological substances status: Secondary | ICD-10-CM | POA: Insufficient documentation

## 2024-08-14 DIAGNOSIS — Z79899 Other long term (current) drug therapy: Secondary | ICD-10-CM | POA: Diagnosis not present

## 2024-08-14 DIAGNOSIS — R1032 Left lower quadrant pain: Secondary | ICD-10-CM | POA: Diagnosis present

## 2024-08-14 DIAGNOSIS — E785 Hyperlipidemia, unspecified: Secondary | ICD-10-CM | POA: Insufficient documentation

## 2024-08-14 DIAGNOSIS — Z955 Presence of coronary angioplasty implant and graft: Secondary | ICD-10-CM | POA: Insufficient documentation

## 2024-08-14 DIAGNOSIS — K5792 Diverticulitis of intestine, part unspecified, without perforation or abscess without bleeding: Secondary | ICD-10-CM | POA: Diagnosis present

## 2024-08-14 DIAGNOSIS — I2510 Atherosclerotic heart disease of native coronary artery without angina pectoris: Secondary | ICD-10-CM | POA: Insufficient documentation

## 2024-08-14 DIAGNOSIS — Z7982 Long term (current) use of aspirin: Secondary | ICD-10-CM | POA: Insufficient documentation

## 2024-08-14 DIAGNOSIS — Z7401 Bed confinement status: Secondary | ICD-10-CM | POA: Diagnosis not present

## 2024-08-14 DIAGNOSIS — J9691 Respiratory failure, unspecified with hypoxia: Secondary | ICD-10-CM | POA: Diagnosis not present

## 2024-08-14 DIAGNOSIS — R5383 Other fatigue: Secondary | ICD-10-CM | POA: Diagnosis not present

## 2024-08-14 LAB — COMPREHENSIVE METABOLIC PANEL WITH GFR
ALT: 14 U/L (ref 0–44)
AST: 17 U/L (ref 15–41)
Albumin: 4.3 g/dL (ref 3.5–5.0)
Alkaline Phosphatase: 34 U/L — ABNORMAL LOW (ref 38–126)
Anion gap: 10 (ref 5–15)
BUN: 13 mg/dL (ref 8–23)
CO2: 29 mmol/L (ref 22–32)
Calcium: 9.6 mg/dL (ref 8.9–10.3)
Chloride: 103 mmol/L (ref 98–111)
Creatinine, Ser: 0.8 mg/dL (ref 0.44–1.00)
GFR, Estimated: >60 mL/min
Glucose, Bld: 93 mg/dL (ref 70–99)
Potassium: 3.9 mmol/L (ref 3.5–5.1)
Sodium: 142 mmol/L (ref 135–145)
Total Bilirubin: 0.6 mg/dL (ref 0.0–1.2)
Total Protein: 6.7 g/dL (ref 6.5–8.1)

## 2024-08-14 LAB — CBC WITH DIFFERENTIAL/PLATELET
Abs Immature Granulocytes: 0.02 K/uL (ref 0.00–0.07)
Basophils Absolute: 0 K/uL (ref 0.0–0.1)
Basophils Relative: 0 %
Eosinophils Absolute: 0.1 K/uL (ref 0.0–0.5)
Eosinophils Relative: 2 %
HCT: 37.7 % (ref 36.0–46.0)
Hemoglobin: 12.5 g/dL (ref 12.0–15.0)
Immature Granulocytes: 0 %
Lymphocytes Relative: 14 %
Lymphs Abs: 1 K/uL (ref 0.7–4.0)
MCH: 31.1 pg (ref 26.0–34.0)
MCHC: 33.2 g/dL (ref 30.0–36.0)
MCV: 93.8 fL (ref 80.0–100.0)
Monocytes Absolute: 0.8 K/uL (ref 0.1–1.0)
Monocytes Relative: 12 %
Neutro Abs: 5.3 K/uL (ref 1.7–7.7)
Neutrophils Relative %: 72 %
Platelets: 160 K/uL (ref 150–400)
RBC: 4.02 MIL/uL (ref 3.87–5.11)
RDW: 13.3 % (ref 11.5–15.5)
WBC: 7.3 K/uL (ref 4.0–10.5)
nRBC: 0 % (ref 0.0–0.2)

## 2024-08-14 LAB — URINALYSIS, ROUTINE W REFLEX MICROSCOPIC
Bilirubin Urine: NEGATIVE
Glucose, UA: NEGATIVE mg/dL
Ketones, ur: NEGATIVE mg/dL
Leukocytes,Ua: NEGATIVE
Nitrite: NEGATIVE
Protein, ur: NEGATIVE mg/dL
Specific Gravity, Urine: 1.02 (ref 1.005–1.030)
pH: 7.5 (ref 5.0–8.0)

## 2024-08-14 LAB — URINALYSIS, MICROSCOPIC (REFLEX)
Bacteria, UA: NONE SEEN
RBC / HPF: NONE SEEN RBC/hpf (ref 0–5)
Squamous Epithelial / HPF: NONE SEEN /HPF (ref 0–5)

## 2024-08-14 LAB — LIPASE, BLOOD: Lipase: 26 U/L (ref 11–51)

## 2024-08-14 LAB — RESP PANEL BY RT-PCR (RSV, FLU A&B, COVID)  RVPGX2
Influenza A by PCR: NEGATIVE
Influenza B by PCR: NEGATIVE
Resp Syncytial Virus by PCR: NEGATIVE
SARS Coronavirus 2 by RT PCR: NEGATIVE

## 2024-08-14 MED ORDER — CIPROFLOXACIN IN D5W 400 MG/200ML IV SOLN
400.0000 mg | Freq: Once | INTRAVENOUS | Status: AC
  Administered 2024-08-14: 400 mg via INTRAVENOUS
  Filled 2024-08-14: qty 200

## 2024-08-14 MED ORDER — MORPHINE SULFATE (PF) 4 MG/ML IV SOLN
4.0000 mg | Freq: Once | INTRAVENOUS | Status: AC
  Administered 2024-08-14: 4 mg via INTRAVENOUS
  Filled 2024-08-14: qty 1

## 2024-08-14 MED ORDER — METRONIDAZOLE 500 MG/100ML IV SOLN
500.0000 mg | Freq: Once | INTRAVENOUS | Status: AC
  Administered 2024-08-14: 500 mg via INTRAVENOUS
  Filled 2024-08-14: qty 100

## 2024-08-14 MED ORDER — IOHEXOL 300 MG/ML  SOLN
75.0000 mL | Freq: Once | INTRAMUSCULAR | Status: AC | PRN
  Administered 2024-08-14: 75 mL via INTRAVENOUS

## 2024-08-14 NOTE — ED Provider Notes (Signed)
 " [place] [facility] Provider Note   CSN: [id number] Arrival date & time: 08/14/24  1017     Patient presents with: Abdominal Pain   Kim Sutton is a 89 y.o. female with a past medical history significant for CAD, hypertension, hyperlipidemia, and tobacco abuse who presents to the ED due to left lower quadrant abdominal pain that started yesterday.  Admits to urinary frequency and urgency.  Denies any dysuria.  Has had a total abdominal hysterectomy and cholecystectomy.  Denies any nausea, vomiting, and diarrhea.  Notes she has been having normal bowel movements without any constipation.  No chest pain or shortness of breath.  Denies fever and chills.  History obtained from patient and past medical records. No interpreter used during encounter.       Prior to Admission medications  Medication Sig Start Date End Date Taking? Authorizing Provider  aspirin  81 MG tablet Take 324 mg by mouth daily.    Yes [provider]  lisinopril (ZESTRIL) 20 MG tablet Take 20 mg by mouth daily.   Yes [provider]  metoprolol succinate (TOPROL-XL) 50 MG 24 hr tablet Take 50 mg by mouth daily. Take with or immediately following a meal.   Yes [provider]  rosuvastatin  (CRESTOR ) 5 MG tablet Take 1 tablet (5 mg total) by mouth daily. 05/05/22 08/14/24 Yes Kate Lonni CROME, MD  SPIRIVA RESPIMAT 2.5 MCG/ACT AERS INHALE 2 PUFFS ONCE DAILY 90 for 30   Yes [provider]  diclofenac  Sodium (VOLTAREN ) 1 % GEL Apply 2 g topically 4 (four) times daily as needed. Patient not taking: Reported on 08/14/2024 05/30/22   Long, Fonda MATSU, MD  omeprazole  (PRILOSEC) 20 MG capsule Take 1 capsule (20 mg total) by mouth daily. Patient not taking: Reported on 08/14/2024 05/16/24   Dean Clarity, MD  oxyCODONE -acetaminophen  (PERCOCET/ROXICET) 5-325 MG tablet Take 1 tablet by mouth every 6 (six) hours as needed for severe pain. Patient not  taking: Reported on 08/14/2024 05/30/22   Long, Fonda MATSU, MD  senna-docusate (SENOKOT-S) 8.6-50 MG tablet Take 1 tablet by mouth at bedtime as needed for mild constipation or moderate constipation. Patient not taking: Reported on 08/14/2024 05/30/22   Darra Fonda MATSU, MD  ZETIA 10 MG tablet  07/22/13   [provider]    Allergies: Ceclor [cefaclor] and Atorvastatin    Review of Systems  Respiratory:  Negative for shortness of breath.   Cardiovascular:  Negative for chest pain.  Gastrointestinal:  Positive for abdominal pain. Negative for diarrhea, nausea and vomiting.  Genitourinary:  Positive for frequency and urgency. Negative for dysuria.    Updated Vital Signs BP (!) 180/90 (BP Location: Right Arm)   Pulse (!) 57   Temp (!) 97.5 F (36.4 C) (Oral)   Resp 17   Ht 5' 6 (1.676 m)   Wt 72.6 kg   SpO2 96%   BMI 25.83 kg/m   Physical Exam Vitals and nursing note reviewed.  Constitutional:      General: She is not in acute distress.    Appearance: She is not ill-appearing.  HENT:     Head: Normocephalic.  Eyes:     Pupils: Pupils are equal, round, and reactive to light.  Cardiovascular:     Rate and Rhythm: Normal rate and regular rhythm.     Pulses: Normal pulses.     Heart sounds: Normal heart sounds. No murmur heard.    No friction rub. No gallop.  Pulmonary:     Effort: Pulmonary effort is normal.     Breath sounds: Normal breath sounds.  Abdominal:     General: Abdomen is flat. There is no distension.     Palpations: Abdomen is soft.     Tenderness: There is abdominal tenderness. There is no guarding or rebound.     Comments: Diffuse tenderness, most significant in LLQ.   Musculoskeletal:        General: Normal range of motion.     Cervical back: Neck supple.  Skin:    General: Skin is warm and dry.  Neurological:     General: No focal deficit present.     Mental Status: She is alert.  Psychiatric:        Mood and Affect: Mood normal.         Behavior: Behavior normal.     (all labs ordered are listed, but only abnormal results are displayed) Labs Reviewed  COMPREHENSIVE METABOLIC PANEL WITH GFR - Abnormal; Notable for the following components:      Result Value   Alkaline Phosphatase 34 (*)    All other components within normal limits  URINALYSIS, ROUTINE W REFLEX MICROSCOPIC - Abnormal; Notable for the following components:   Hgb urine dipstick TRACE (*)    All other components within normal limits  RESP PANEL BY RT-PCR (RSV, FLU A&B, COVID)  RVPGX2  LIPASE, BLOOD  CBC WITH DIFFERENTIAL/PLATELET  URINALYSIS, MICROSCOPIC (REFLEX)    EKG: EKG Interpretation Date/Time:  Wednesday August 14 2024 11:28:29 EST Ventricular Rate:  48 PR Interval:  187 QRS Duration:  137 QT Interval:  487 QTC Calculation: 436 R Axis:   26  Text Interpretation: Sinus bradycardia Right bundle branch block Confirmed by Yolande Charleston [phone]) on 08/14/2024 5:58:29 PM  Radiology: DG Chest Portable 1 View Result Date: 08/14/2024 CLINICAL DATA:  Hypoxia, left lower quadrant pain EXAM: PORTABLE CHEST 1 VIEW COMPARISON:  07/06/2024 FINDINGS: Single frontal view of the chest demonstrates an unremarkable cardiac silhouette. Diffuse interstitial prominence compatible with background scarring. No acute airspace disease, effusion, or pneumothorax. No acute bony abnormalities. IMPRESSION: 1. Chronic scarring.  No acute process. Electronically Signed   By: Ozell Daring M.D.   On: 08/14/2024 15:41   CT ABDOMEN PELVIS W CONTRAST Result Date: 08/14/2024 EXAM: CT ABDOMEN AND PELVIS WITH CONTRAST 08/14/2024 12:51:58 PM TECHNIQUE: CT of the abdomen and pelvis was performed with the administration of 75 mL of iohexol  (OMNIPAQUE ) 300 MG/ML solution. Multiplanar reformatted images are provided for review. Automated exposure control, iterative reconstruction, and/or weight-based adjustment of the mA/kV was utilized to reduce the radiation dose to as low as  reasonably achievable. COMPARISON: 05/16/2024. CLINICAL HISTORY: LLQ abdominal pain. Patient presents with left lower quadrant abdominal pain. FINDINGS: LOWER CHEST: Mild cardiomegaly. Dense right coronary artery atherosclerosis. Dependent atelectasis in the lung bases. LIVER: Small cyst in the left hepatic lobe. GALLBLADDER AND BILE DUCTS: Cholecystectomy. No sonographic findings of acute cholecystitis. No biliary ductal dilatation. SPLEEN: No acute abnormality. PANCREAS: No acute abnormality. ADRENAL GLANDS: Unchanged 2.6 cm left adrenal nodule. This is consistent with a benign adenoma. KIDNEYS, URETERS AND BLADDER: Unchanged tiny subcentimeter hypodensity in the right interpolar region, too small to definitively characterize, common but likely a small cyst. No stones in the kidneys or ureters. No hydronephrosis. No perinephric or periureteral stranding. Urinary bladder is unremarkable. GI AND BOWEL: Stomach demonstrates no acute abnormality. Descending and sigmoid colonic diverticulosis. Inflammation surrounding the mid to distal descending colonic diverticula. No peridiverticular abscess  or pneumoperitoneum. The appendix was not visualized. There is no bowel obstruction. PERITONEUM AND RETROPERITONEUM: Trace free fluid in the pelvis, likely reactive. No free air. VASCULATURE: Aorta is normal in caliber. Extensive aortoiliac atherosclerosis. LYMPH NODES: Unchanged 1.8 cm fluid density structure in the aortocaval region, probably a prominent cisterna chyli .no lymphadenopathy. REPRODUCTIVE ORGANS: Hysterectomy. BONES AND SOFT TISSUES: Diffuse osteopenia. Mild multilevel degenerative disc disease of the visualized spine. Mild bilateral hip osteoarthritis. No focal soft tissue abnormality. IMPRESSION: 1. Acute diverticulitis of the mid to distal descending colon. No peridiverticular abscess or pneumoperitoneum. Electronically signed by: Rogelia Myers MD 08/14/2024 02:02 PM EST RP Workstation: [id number]      .Critical Care  Performed by: Lorelle Aleck BROCKS, PA-C Authorized by: Lorelle Aleck BROCKS, PA-C   Critical care provider statement:    Critical care time (minutes):  31   Critical care was necessary to treat or prevent imminent or life-threatening deterioration of the following conditions:  Respiratory failure   Critical care was time spent personally by me on the following activities:  Development of treatment plan with patient or surrogate, discussions with consultants, evaluation of patient's response to treatment, examination of patient, ordering and review of laboratory studies, ordering and review of radiographic studies, ordering and performing treatments and interventions, pulse oximetry, re-evaluation of patient's condition and review of old charts   I assumed direction of critical care for this patient from another provider in my specialty: no     Care discussed with: admitting provider      Medications Ordered in the ED  morphine  (PF) 4 MG/ML injection 4 mg (4 mg Intravenous Given 08/14/24 1144)  iohexol  (OMNIPAQUE ) 300 MG/ML solution 75 mL (75 mLs Intravenous Contrast Given 08/14/24 1236)  ciprofloxacin  (CIPRO ) IVPB 400 mg (400 mg Intravenous New Bag/Given 08/14/24 1459)    And  metroNIDAZOLE  (FLAGYL ) IVPB 500 mg (500 mg Intravenous New Bag/Given 08/14/24 1502)                                    Medical Decision Making Amount and/or Complexity of Data Reviewed Independent Historian: spouse Labs: ordered. Decision-making details documented in ED Course. Radiology: ordered and independent interpretation performed. Decision-making details documented in ED Course.  Risk Prescription drug management. Decision regarding hospitalization.   This patient presents to the ED for concern of LLQ, this involves an extensive number of treatment options, and is a complaint that carries with it a high risk of complications and morbidity.  The differential diagnosis includes  diverticulitis, bowel obstruction, appendicitis, perforation, etc  89 year old female presents to the ED due to left lower quadrant abdominal pain that started yesterday.  No nausea, vomiting, or diarrhea.  Does admit to some urinary frequency and urgency.  Has had a total abdominal hysterectomy and cholecystectomy.  Upon arrival patient afebrile, not tachycardic or hypoxic.  Patient in no acute distress.  Abdomen soft, nondistended with diffuse tenderness most significant in left lower quadrant.  Routine labs ordered.  IV morphine  given.  CT abdomen to rule out evidence of diverticulitis versus bowel obstruction versus other etiologies of pain.  CBC with no leukocytosis.  Normal hemoglobin.  CMP with normal renal function.  No major electrolyte derangements.  UA with trace hematuria.  No evidence of infection.  Directly after patient was given morphine  patient O2 saturation dropped in the upper 80s with good waveform.  Patient placed on 2 L nasal cannula.  Lungs clear  to auscultation bilaterally.  Suspect hypoxic event likely secondary to morphine .  Patient resting comfortably in bed upon reassessment.  O2 turned off during reassessment and patient maintain O2 saturation above 95% on room air.  CT abdomen personally reviewed and interpreted which demonstrates acute diverticulitis.  No evidence of perforation or abscess.  Given patient's significant pain will discuss with hospitalist for admission for IV antibiotics.  IV ciprofloxacin  and Flagyl  started.  Patient has anaphylactic reaction to cephalosporins.  Discussed with Dr. Elnor who agrees with assessment and plan.  Co morbidities that complicate the patient evaluation  CAD, HTN, HL Cardiac Monitoring: / EKG:  The patient was maintained on a cardiac monitor.  I personally viewed and interpreted the cardiac monitored which showed an underlying rhythm of: sinus bradycardia, HR 48  Social Determinants of Health:  Elderly >65  Test / Admission -  Considered:  Admit for acute diverticulitis due to severe pain  3:10 PM Discussed with Dr. Arlice with [facility] who agrees to admit  4:22 PM informed by RN that patient is hypoxic again. Lungs without wheeze. CXR and RVP ordered. Patient placed on 2L [place].  Chest x-ray personally reviewed and interpreted which is negative for signs of pneumonia.  RVP negative.  Unclear what is causing patient's intermittent hypoxia.  It does appear it occurs most when patient is asleep.  Undiagnosed sleep apnea?  If hypoxia continues, patient may need further work-up during admission.      Final diagnoses:  Diverticulitis  Hypoxia    ED Discharge Orders     None          Lorelle Aleck BROCKS, PA-C 08/14/24 1849    Elnor Jayson LABOR, DO 08/15/24 [phone]  "

## 2024-08-14 NOTE — Progress Notes (Signed)
 RN received Pt to bed 1342 with her husband at bedside. Admitting provider contacted to make aware of pt's arrival. Pt alert and oriented. HOH, complains of 5/10 LLQ ABD pain. RN waiting for admit orders.

## 2024-08-14 NOTE — ED Notes (Signed)
 Call [phone]  Karleen Creamer  the Patients Husband  when the Pt. Gets a bed and is being admitted.

## 2024-08-14 NOTE — Progress Notes (Signed)
 [facility] requesting transfer: [facility] Requesting Provider: PA Aleck Reason for transfer: Acute diverticulitis  Facility course:  Kim Sutton is a 89 y.o. female with PMH significant for HTN, HLD, CAD/stent, smoking  Patient presents to ED today with complaint of left lower quadrant abdominal pain since yesterday. No nausea vomiting or diarrhea. Reports normal bowel movement  Hemodynamically stable, afebrile CBC and CMP unremarkable Urinalysis unremarkable CT abdomen pelvis showed acute diverticulitis of the mid to distal descending colon. No peridiverticular abscess or pneumoperitoneum. She was given IV Rocephin, IV Flagyl . For pain control, she was given IV morphine  after which she became hypoxic and required 2 L oxygen.  Gradually waking up and getting weaned off oxygen  ED PA requested inpatient care. Given her degree of pain and hypoxia in the ED, I have accepted her on MedSurg bed for now.. However, I believe that once the morphine  wears off, she will be more alert, probably will not need oxygen and probably can be discharged home on oral antibiotics and oral pain medicines. Because of high patient volume and scarcity of inpatient bed, patient might have to wait in the ED for several hours.  If clinically improves as stated above, ED provider hopefully will be able to discharge her right from ED.  Please note that [facility] will assume the care once patient arrives to the hospital. Prior to that, please reach out to the ED provider on site for any medical need.   Signed, Chapman Rota, MD [facility] 08/14/2024

## 2024-08-14 NOTE — ED Notes (Signed)
 RN took Pt. To restroom via w/c and got clean catch urine.

## 2024-08-14 NOTE — ED Triage Notes (Signed)
"   Left lower pain x 1  days  no N/v/d  hx of KS has had freq and urgency "

## 2024-08-14 NOTE — ED Notes (Signed)
 Pt. States the lower L abd. Is where her pain is and she has had no vomiting or diarrhea.  Pt. Does have pain in the L lower quadrant of the abd. When she lifts the L leg.

## 2024-08-15 ENCOUNTER — Other Ambulatory Visit (HOSPITAL_COMMUNITY): Payer: Self-pay

## 2024-08-15 DIAGNOSIS — R400 Somnolence: Secondary | ICD-10-CM | POA: Diagnosis not present

## 2024-08-15 DIAGNOSIS — K5792 Diverticulitis of intestine, part unspecified, without perforation or abscess without bleeding: Principal | ICD-10-CM

## 2024-08-15 LAB — COMPREHENSIVE METABOLIC PANEL WITH GFR
ALT: 13 U/L (ref 0–44)
AST: 17 U/L (ref 15–41)
Albumin: 3.7 g/dL (ref 3.5–5.0)
Alkaline Phosphatase: 38 U/L (ref 38–126)
Anion gap: 6 (ref 5–15)
BUN: 9 mg/dL (ref 8–23)
CO2: 29 mmol/L (ref 22–32)
Calcium: 9 mg/dL (ref 8.9–10.3)
Chloride: 103 mmol/L (ref 98–111)
Creatinine, Ser: 0.75 mg/dL (ref 0.44–1.00)
GFR, Estimated: >60 mL/min
Glucose, Bld: 105 mg/dL — ABNORMAL HIGH (ref 70–99)
Potassium: 3.7 mmol/L (ref 3.5–5.1)
Sodium: 139 mmol/L (ref 135–145)
Total Bilirubin: 0.8 mg/dL (ref 0.0–1.2)
Total Protein: 6.3 g/dL — ABNORMAL LOW (ref 6.5–8.1)

## 2024-08-15 LAB — CBC
HCT: 36.5 % (ref 36.0–46.0)
Hemoglobin: 12.3 g/dL (ref 12.0–15.0)
MCH: 31.3 pg (ref 26.0–34.0)
MCHC: 33.7 g/dL (ref 30.0–36.0)
MCV: 92.9 fL (ref 80.0–100.0)
Platelets: 157 K/uL (ref 150–400)
RBC: 3.93 MIL/uL (ref 3.87–5.11)
RDW: 13.3 % (ref 11.5–15.5)
WBC: 7.1 K/uL (ref 4.0–10.5)
nRBC: 0 % (ref 0.0–0.2)

## 2024-08-15 LAB — PROCALCITONIN: Procalcitonin: <0.1 ng/mL

## 2024-08-15 LAB — LACTIC ACID, PLASMA
Lactic Acid, Venous: 0.8 mmol/L (ref 0.5–1.9)
Lactic Acid, Venous: 0.7 mmol/L (ref 0.5–1.9)

## 2024-08-15 MED ORDER — UMECLIDINIUM BROMIDE 62.5 MCG/ACT IN AEPB
1.0000 | INHALATION_SPRAY | Freq: Every day | RESPIRATORY_TRACT | Status: DC
  Administered 2024-08-15: 1 via RESPIRATORY_TRACT
  Filled 2024-08-15: qty 7

## 2024-08-15 MED ORDER — MAGNESIUM SULFATE 2 GM/50ML IV SOLN
2.0000 g | Freq: Once | INTRAVENOUS | Status: AC
  Administered 2024-08-15: 2 g via INTRAVENOUS
  Filled 2024-08-15: qty 50

## 2024-08-15 MED ORDER — ACETAMINOPHEN 650 MG RE SUPP: 650.0000 mg | Freq: Four times a day (QID) | RECTAL | Status: DC | PRN

## 2024-08-15 MED ORDER — HEPARIN SODIUM (PORCINE) 5000 UNIT/ML IJ SOLN
5000.0000 [IU] | Freq: Three times a day (TID) | INTRAMUSCULAR | Status: DC
  Administered 2024-08-15: 5000 [IU] via SUBCUTANEOUS
  Filled 2024-08-15: qty 1

## 2024-08-15 MED ORDER — AMOXICILLIN-POT CLAVULANATE 875-125 MG PO TABS
1.0000 | ORAL_TABLET | Freq: Two times a day (BID) | ORAL | Status: DC
  Administered 2024-08-15: 1 via ORAL
  Filled 2024-08-15: qty 1

## 2024-08-15 MED ORDER — TRAMADOL HCL 50 MG PO TABS
50.0000 mg | ORAL_TABLET | Freq: Four times a day (QID) | ORAL | 0 refills | Status: AC | PRN
  Filled 2024-08-15: qty 20, 5d supply, fill #0

## 2024-08-15 MED ORDER — CIPROFLOXACIN IN D5W 400 MG/200ML IV SOLN
400.0000 mg | Freq: Two times a day (BID) | INTRAVENOUS | Status: DC
  Administered 2024-08-15: 400 mg via INTRAVENOUS
  Filled 2024-08-15: qty 200

## 2024-08-15 MED ORDER — ALBUTEROL SULFATE (2.5 MG/3ML) 0.083% IN NEBU: 2.5000 mg | INHALATION_SOLUTION | RESPIRATORY_TRACT | Status: DC | PRN

## 2024-08-15 MED ORDER — ACETAMINOPHEN 325 MG PO TABS: 650.0000 mg | ORAL_TABLET | Freq: Four times a day (QID) | ORAL | Status: DC | PRN

## 2024-08-15 MED ORDER — SODIUM CHLORIDE 0.9 % IV SOLN: INTRAVENOUS | Status: DC

## 2024-08-15 MED ORDER — TIOTROPIUM BROMIDE 2.5 MCG/ACT IN AERS: INHALATION_SPRAY | Freq: Every day | RESPIRATORY_TRACT | Status: DC

## 2024-08-15 MED ORDER — ONDANSETRON HCL 4 MG/2ML IJ SOLN
4.0000 mg | Freq: Four times a day (QID) | INTRAMUSCULAR | Status: DC | PRN
  Administered 2024-08-15: 4 mg via INTRAVENOUS
  Filled 2024-08-15: qty 2

## 2024-08-15 MED ORDER — DIPHENHYDRAMINE HCL 25 MG PO CAPS
25.0000 mg | ORAL_CAPSULE | Freq: Once | ORAL | Status: AC
  Administered 2024-08-15: 25 mg via ORAL
  Filled 2024-08-15: qty 1

## 2024-08-15 MED ORDER — AMOXICILLIN-POT CLAVULANATE 875-125 MG PO TABS
1.0000 | ORAL_TABLET | Freq: Two times a day (BID) | ORAL | 0 refills | Status: AC
  Filled 2024-08-15: qty 20, 10d supply, fill #0

## 2024-08-15 MED ORDER — METRONIDAZOLE 500 MG/100ML IV SOLN
500.0000 mg | Freq: Two times a day (BID) | INTRAVENOUS | Status: DC
  Administered 2024-08-15: 500 mg via INTRAVENOUS
  Filled 2024-08-15: qty 100

## 2024-08-15 MED ORDER — ASPIRIN 325 MG PO TBEC
325.0000 mg | DELAYED_RELEASE_TABLET | Freq: Every day | ORAL | Status: DC
  Administered 2024-08-15: 325 mg via ORAL
  Filled 2024-08-15: qty 1

## 2024-08-15 MED ORDER — FAMOTIDINE IN NACL 20-0.9 MG/50ML-% IV SOLN
20.0000 mg | Freq: Once | INTRAVENOUS | Status: AC
  Administered 2024-08-15: 20 mg via INTRAVENOUS
  Filled 2024-08-15: qty 50

## 2024-08-15 MED ORDER — ONDANSETRON HCL 4 MG PO TABS: 4.0000 mg | ORAL_TABLET | Freq: Four times a day (QID) | ORAL | Status: DC | PRN

## 2024-08-15 NOTE — Discharge Summary (Signed)
 Physician Discharge Summary  Kim Sutton FMW:[id number] DOB: [birth year] DOA: 08/14/2024  PCP: Dwight Trula SQUIBB, MD  Admit date: 08/14/2024 Discharge date: 08/15/2024  Time spent: 42 minutes  Recommendations for Outpatient Follow-up:  Complete all of the course of Augmentin  to treat diverticulitis and follow-up in the outpatient setting within 1 week for Chem-12 CBC Can use Tylenol  for stress tramadol  second choice prescription given  Discharge Diagnoses:  MAIN problem for hospitalization   Acute diverticulitis  Please see below for itemized issues addressed in HOpsital- refer to other progress notes for clarity if needed  Discharge Condition: Improved  Diet recommendation: Soft diet to begin with  [facility] Weights   08/14/24 1032  Weight: 72.6 kg    History of present illness:  89 year old female known chronic tobacco abuse, CAD + stent placed 2001 previous lower extremity cellulitis hypertension Previous ED visit 05/2024 with chest pain CTA without dissection GI cocktail relieved pain told to follow-up with cardiology  revisit 07/06/2024 with mechanical fall-fell onto her left side no LOC was able to ambulate bear weight-trauma series performed showed mainly swelling no acute fractures patient was encouraged to follow-up in the outpatient with primary care 1/14-evaluate [facility] ED 2/2 left lower abdominal pain urinary frequency urgency had TAH cholecystectomy previously--sodium 142 potassium 3.9 BUN/creatinine 13/0.8--WBC 7.3 hemoglobin 12.5 platelet 160 trace ketones  CT ABD pelvis acute diverticulitis mid distal descending colon.  No peridiverticular abscess--CXR chronic scarring--flu RSV COVID-negative--lactic acid 0.8 procalcitonin less than 0.1--Rx Rocephin Flagyl  morphine  became hypoxic requiring oxygen    Assessment  & Plan :      Hypoxic respiratory failure secondary to medication effect morphine  Completely self resolved was less somnolent less lethargic and hypoxic and is  coherent and talking in full sentences right now We have discontinued hydrocodone and placed her on low-dose tramadol  for pain control and her abdomen she should use Tylenol  around-the-clock until follow-up is performed in the outpatient setting and she should monitor herself for other symptoms   Acute diverticulitis Mild diverticulitis by definition she is able to keep down liquids okay has no chest pain no fever-she was placed on Flagyl  and ciprofloxacin  and developed significant itching-was given Benadryl  and Pepcid  and symptoms resolved I have transitioned the patient to Augmentin  1 tablet twice daily and if she does not have any symptoms or any other problems she can probably complete the course of therapy in the outpatient setting and finish therapy as an outpatient Her pain is moderately controlled and we will use medications as above   Hypomagnesemia Treated earlier in hospital stay   Mild allergy to medication See above discussion      Data Reviewed today: Sodium 139 potassium 3.7 BUN/creatinine 9/0.75 LFTs normal, lactic acid 0.7 procalcitonin less than 0.1 WBC 7.1 hemoglobin 12.3 platelet 157  Discharge Exam: Vitals:   08/15/24 0831 08/15/24 0928  BP:  (!) 141/55  Pulse:  (!) 57  Resp:  17  Temp:  98.7 F (37.1 C)  SpO2: 98% 96%    Subj on day of d/c   Awake coherent no distress looks fair feels well eating and drinking no further itching no further somnolence Still has some mild abdominal pain says that the Tylenol  helps Has many concerns about her skin and about a recent surgery by her dermatologist on her right arm that looks good  General Exam on discharge  EOMI NCAT no focal deficit no icterus no pallor no wheeze no rales no rhonchi Abdomen soft no rebound no guarding Chest is  clear no added sound ROM intact no focal deficit Power 5/5 she has old areas on her left leg that look like they have healed bruises Her right arm has a 3 x 4 cm shallow surgical wound  that is covered and approximated  Discharge Instructions   Discharge Instructions     Discharge instructions   Complete by: As directed    You were diagnosed with diverticulitis which is an infection of the colon and usually with severe pain and inability to take tablets or liquid we admit patients however you look pretty stable and have been able to manage without severe symptoms-as such I think we can get you out of the hospital to complete 10 days total of Augmentin  and you will need a follow-up visit with your primary care in the next 5 to 10 days to get labs and to reassess how you feel For the next 2 to 3 days I would recommend a very soft diet such as soups and crackers and when you feel your pain is improved you can go back to a regular diet It would be a good idea to take tramadol  50 mg every 8 hours for very severe pain in your abdomen not relieved by scheduled Tylenol  Please follow-up with your primary physician who might have you on different medications and they will advise you accordingly if you still need all of them Please also follow-up with your dermatologist for follow-up of your skin issue on your right arm   Increase activity slowly   Complete by: As directed       Allergies as of 08/15/2024       Reactions   Ceclor [cefaclor] Hives, Anaphylaxis   Atorvastatin Other (See Comments)        Medication List     STOP taking these medications    oxyCODONE -acetaminophen  5-325 MG tablet Commonly known as: PERCOCET/ROXICET       TAKE these medications    amoxicillin -clavulanate 875-125 MG tablet Commonly known as: AUGMENTIN  Take 1 tablet by mouth every 12 (twelve) hours.   aspirin  81 MG tablet Take 324 mg by mouth daily.   diclofenac  Sodium 1 % Gel Commonly known as: Voltaren  Apply 2 g topically 4 (four) times daily as needed.   lisinopril 20 MG tablet Commonly known as: ZESTRIL Take 20 mg by mouth daily.   metoprolol succinate 50 MG 24 hr  tablet Commonly known as: TOPROL-XL Take 50 mg by mouth daily. Take with or immediately following a meal.   omeprazole  20 MG capsule Commonly known as: PRILOSEC Take 1 capsule (20 mg total) by mouth daily.   rosuvastatin  5 MG tablet Commonly known as: CRESTOR  Take 1 tablet (5 mg total) by mouth daily.   senna-docusate 8.6-50 MG tablet Commonly known as: Senokot-S Take 1 tablet by mouth at bedtime as needed for mild constipation or moderate constipation.   Spiriva Respimat 2.5 MCG/ACT Aers Generic drug: Tiotropium Bromide  INHALE 2 PUFFS ONCE DAILY 90 for 30   traMADol  50 MG tablet Commonly known as: Ultram  Take 1 tablet (50 mg total) by mouth every 6 (six) hours as needed for severe pain (pain score 7-10).   Zetia 10 MG tablet Generic drug: ezetimibe       Allergies[1]    The results of significant diagnostics from this hospitalization (including imaging, microbiology, ancillary and laboratory) are listed below for reference.    Significant Diagnostic Studies: DG Chest Portable 1 View Result Date: 08/14/2024 CLINICAL DATA:  Hypoxia, left lower quadrant pain  EXAM: PORTABLE CHEST 1 VIEW COMPARISON:  07/06/2024 FINDINGS: Single frontal view of the chest demonstrates an unremarkable cardiac silhouette. Diffuse interstitial prominence compatible with background scarring. No acute airspace disease, effusion, or pneumothorax. No acute bony abnormalities. IMPRESSION: 1. Chronic scarring.  No acute process. Electronically Signed   By: Ozell Daring M.D.   On: 08/14/2024 15:41   CT ABDOMEN PELVIS W CONTRAST Result Date: 08/14/2024 EXAM: CT ABDOMEN AND PELVIS WITH CONTRAST 08/14/2024 12:51:58 PM TECHNIQUE: CT of the abdomen and pelvis was performed with the administration of 75 mL of iohexol  (OMNIPAQUE ) 300 MG/ML solution. Multiplanar reformatted images are provided for review. Automated exposure control, iterative reconstruction, and/or weight-based adjustment of the mA/kV was utilized  to reduce the radiation dose to as low as reasonably achievable. COMPARISON: 05/16/2024. CLINICAL HISTORY: LLQ abdominal pain. Patient presents with left lower quadrant abdominal pain. FINDINGS: LOWER CHEST: Mild cardiomegaly. Dense right coronary artery atherosclerosis. Dependent atelectasis in the lung bases. LIVER: Small cyst in the left hepatic lobe. GALLBLADDER AND BILE DUCTS: Cholecystectomy. No sonographic findings of acute cholecystitis. No biliary ductal dilatation. SPLEEN: No acute abnormality. PANCREAS: No acute abnormality. ADRENAL GLANDS: Unchanged 2.6 cm left adrenal nodule. This is consistent with a benign adenoma. KIDNEYS, URETERS AND BLADDER: Unchanged tiny subcentimeter hypodensity in the right interpolar region, too small to definitively characterize, common but likely a small cyst. No stones in the kidneys or ureters. No hydronephrosis. No perinephric or periureteral stranding. Urinary bladder is unremarkable. GI AND BOWEL: Stomach demonstrates no acute abnormality. Descending and sigmoid colonic diverticulosis. Inflammation surrounding the mid to distal descending colonic diverticula. No peridiverticular abscess or pneumoperitoneum. The appendix was not visualized. There is no bowel obstruction. PERITONEUM AND RETROPERITONEUM: Trace free fluid in the pelvis, likely reactive. No free air. VASCULATURE: Aorta is normal in caliber. Extensive aortoiliac atherosclerosis. LYMPH NODES: Unchanged 1.8 cm fluid density structure in the aortocaval region, probably a prominent cisterna chyli .no lymphadenopathy. REPRODUCTIVE ORGANS: Hysterectomy. BONES AND SOFT TISSUES: Diffuse osteopenia. Mild multilevel degenerative disc disease of the visualized spine. Mild bilateral hip osteoarthritis. No focal soft tissue abnormality. IMPRESSION: 1. Acute diverticulitis of the mid to distal descending colon. No peridiverticular abscess or pneumoperitoneum. Electronically signed by: Rogelia Myers MD 08/14/2024 02:02 PM  EST RP Workstation: [id number]    Microbiology: Recent Results (from the past 240 hours)  Resp panel by RT-PCR (RSV, Flu A&B, Covid) Anterior Nasal Swab     Status: None   Collection Time: 08/14/24  4:00 PM   Specimen: Anterior Nasal Swab  Result Value Ref Range Status   SARS Coronavirus 2 by RT PCR NEGATIVE NEGATIVE Final    Comment: (NOTE) SARS-CoV-2 target nucleic acids are NOT DETECTED.  The SARS-CoV-2 RNA is generally detectable in upper respiratory specimens during the acute phase of infection. The lowest concentration of SARS-CoV-2 viral copies this assay can detect is 138 copies/mL. A negative result does not preclude SARS-Cov-2 infection and should not be used as the sole basis for treatment or other patient management decisions. A negative result may occur with  improper specimen collection/handling, submission of specimen other than nasopharyngeal swab, presence of viral mutation(s) within the areas targeted by this assay, and inadequate number of viral copies(<138 copies/mL). A negative result must be combined with clinical observations, patient history, and epidemiological information. The expected result is Negative.  Fact Sheet for Patients:  bloggercourse.com  Fact Sheet for Healthcare Providers:  seriousbroker.it  This test is no t yet approved or cleared by the [place]  FDA and  has been authorized for detection and/or diagnosis of SARS-CoV-2 by FDA under an Emergency Use Authorization (EUA). This EUA will remain  in effect (meaning this test can be used) for the duration of the COVID-19 declaration under Section 564(b)(1) of the Act, 21 U.S.C.section 360bbb-3(b)(1), unless the authorization is terminated  or revoked sooner.       Influenza A by PCR NEGATIVE NEGATIVE Final   Influenza B by PCR NEGATIVE NEGATIVE Final    Comment: (NOTE) The Xpert Xpress SARS-CoV-2/FLU/RSV plus assay is intended as an  aid in the diagnosis of influenza from Nasopharyngeal swab specimens and should not be used as a sole basis for treatment. Nasal washings and aspirates are unacceptable for Xpert Xpress SARS-CoV-2/FLU/RSV testing.  Fact Sheet for Patients: bloggercourse.com  Fact Sheet for Healthcare Providers: seriousbroker.it  This test is not yet approved or cleared by the [place]  FDA and has been authorized for detection and/or diagnosis of SARS-CoV-2 by FDA under an Emergency Use Authorization (EUA). This EUA will remain in effect (meaning this test can be used) for the duration of the COVID-19 declaration under Section 564(b)(1) of the Act, 21 U.S.C. section 360bbb-3(b)(1), unless the authorization is terminated or revoked.     Resp Syncytial Virus by PCR NEGATIVE NEGATIVE Final    Comment: (NOTE) Fact Sheet for Patients: bloggercourse.com  Fact Sheet for Healthcare Providers: seriousbroker.it  This test is not yet approved or cleared by the [place]  FDA and has been authorized for detection and/or diagnosis of SARS-CoV-2 by FDA under an Emergency Use Authorization (EUA). This EUA will remain in effect (meaning this test can be used) for the duration of the COVID-19 declaration under Section 564(b)(1) of the Act, 21 U.S.C. section 360bbb-3(b)(1), unless the authorization is terminated or revoked.  Performed at [facility], [address] [place], [place] [address]      Labs: Basic Metabolic Panel: Recent Labs  Lab 08/14/24 1100 08/15/24 0355  NA 142 139  K 3.9 3.7  CL 103 103  CO2 29 29  GLUCOSE 93 105*  BUN 13 9  CREATININE 0.80 0.75  CALCIUM  9.6 9.0   Liver Function Tests: Recent Labs  Lab 08/14/24 1100 08/15/24 0355  AST 17 17  ALT 14 13  ALKPHOS 34* 38  BILITOT 0.6 0.8  PROT 6.7 6.3*  ALBUMIN 4.3 3.7   Recent Labs  Lab 08/14/24 1100   LIPASE 26   No results for input(s): AMMONIA in the last 168 hours. CBC: Recent Labs  Lab 08/14/24 1100 08/15/24 0355  WBC 7.3 7.1  NEUTROABS 5.3  --   HGB 12.5 12.3  HCT 37.7 36.5  MCV 93.8 92.9  PLT 160 157   Cardiac Enzymes: No results for input(s): CKTOTAL, CKMB, CKMBINDEX, TROPONINI in the last 168 hours. BNP: BNP (last 3 results) No results for input(s): BNP in the last 8760 hours.  ProBNP (last 3 results) Recent Labs    12/18/23 1724  PROBNP 210.0    CBG: No results for input(s): GLUCAP in the last 168 hours.  Signed:  Colen Grimes MD   [facility] 08/15/2024, 9:55 AM      [1]  Allergies Allergen Reactions   Ceclor [Cefaclor] Hives and Anaphylaxis   Atorvastatin Other (See Comments)

## 2024-08-15 NOTE — Progress Notes (Signed)
 Discharge medications delivered to patient at the bedside in a secure bag.

## 2024-08-15 NOTE — Care Management CC44 (Signed)
"         Condition Code 44 Documentation Completed  Patient Details  Name: Kim Sutton MRN: [id number] Date of Birth: 1934/05/06   Condition Code 44 given:  Yes Patient signature on Condition Code 44 notice:  Yes Documentation of 2 MD's agreement:  Yes Code 44 added to claim:  Yes    Alfonse JONELLE Rex, RN 08/15/2024, 12:51 PM  "

## 2024-08-15 NOTE — TOC Transition Note (Signed)
 Transition of Care [facility]) - Discharge Note   Patient Details  Name: YARROW LINHART MRN: [id number] Date of Birth: 13-Dec-1933  [facility] CM/SW Contact:  Alfonse JONELLE Rex, RN Phone Number: 08/15/2024, 12:55 PM   Clinical Narrative:    DC to home, spouse at bedside. Code 44 completed. No CM needs     Final next level of care: Home/Self Care Barriers to Discharge: No Barriers Identified   Patient Goals and CMS Choice Patient states their goals for this hospitalization and ongoing recovery are:: return home          Discharge Placement                       Discharge Plan and Services Additional resources added to the After Visit Summary for                                       Social Drivers of Health (SDOH) Interventions SDOH Screenings   Food Insecurity: No Food Insecurity (08/14/2024)  Housing: Low Risk (08/14/2024)  Transportation Needs: No Transportation Needs (08/14/2024)  Utilities: Not At Risk (08/14/2024)  Social Connections: Moderately Integrated (08/14/2024)  Tobacco Use: Medium Risk (08/14/2024)     Readmission Risk Interventions    08/15/2024   12:55 PM  Readmission Risk Prevention Plan  Post Dischage Appt Complete  Medication Screening Complete  Transportation Screening Complete

## 2024-08-15 NOTE — H&P (Addendum)
 " History and Physical    Kim Sutton FMW:[id number] DOB: 17-Jan-1934 DOA: 08/14/2024  PCP: Dwight Trula SQUIBB, MD  Patient coming from: [facility]  I have personally briefly reviewed patient's old medical records in [facility]  Chief Complaint: Acute diverticulitis, lethargy due to medication   HPI: Kim Sutton is a 89 y.o. female with medical history significant of CAD s/p stent, HlD, HTN,tobacco abuse who presents to [facility] with complaint of  left lower quad pain x 1 day w/o associated n/v/d/ no fever. Patient notes feels improved. However has complaint of lower extremity cramping.   ED Course:  IN ED on evaluation patient found to have acute diverticulitis. Patient course complicated by lethargy s/p iv morphine .  Patient admitted for further treatment and observation. Patient placed on O2 and will be monitored overnight.   Vitals: Afeb bp 152/93, hr 52, rr 16, sat 95%  on [place]  Labs: Wbc 7.3, hgb 12.5, plt 160  Na 142, K 3.9, cl 103, gu 39, cr 0.8,  Lipase 26  RVP:neg UA :neg Cxr:NAD CT abd MPRESSION: 1. Acute diverticulitis of the mid to distal descending colon. No peridiverticular abscess or pneumoperitoneum. Nsr , rbbb   Review of Systems: As per HPI otherwise 10 point review of systems negative.   Past Medical History:  Diagnosis Date   Adrenal cortical adenoma of left adrenal gland    stable since 2009;  MRI in 04/2010 - no further scans   CAD (coronary artery disease)    a. s/p NSTEMI in 2001 tx with BMS to RCA   Colon polyps    HLD (hyperlipidemia)    Hx of cardiovascular stress test    Lexiscan  Myoview  (09/2013):  No ischemia, EF 77%, Low Risk.   Hyperglycemia    Hypertension    Myoclonus    on neurontin per neuro at [facility]   Nephrolithiasis    Nodule of right lung    a. 7mm on CXR 10/2009;  CT 05/2010 ok - no further scans   Tobacco abuse     Past Surgical History:  Procedure Laterality Date   CHOLECYSTECTOMY     CORONARY ANGIOPLASTY WITH STENT PLACEMENT      TOTAL ABDOMINAL HYSTERECTOMY       reports that she has quit smoking. She has never used smokeless tobacco. She reports that she does not currently use drugs. She reports that she does not drink alcohol.  Allergies[1]  History reviewed. No pertinent family history.  Prior to Admission medications  Medication Sig Start Date End Date Taking? Authorizing Provider  aspirin  81 MG tablet Take 324 mg by mouth daily.    Yes [provider]  lisinopril (ZESTRIL) 20 MG tablet Take 20 mg by mouth daily.   Yes [provider]  metoprolol succinate (TOPROL-XL) 50 MG 24 hr tablet Take 50 mg by mouth daily. Take with or immediately following a meal.   Yes [provider]  rosuvastatin  (CRESTOR ) 5 MG tablet Take 1 tablet (5 mg total) by mouth daily. 05/05/22 08/14/24 Yes Kate Lonni CROME, MD  SPIRIVA RESPIMAT 2.5 MCG/ACT AERS INHALE 2 PUFFS ONCE DAILY 90 for 30   Yes [provider]  diclofenac  Sodium (VOLTAREN ) 1 % GEL Apply 2 g topically 4 (four) times daily as needed. Patient not taking: Reported on 08/14/2024 05/30/22   Long, Fonda MATSU, MD  omeprazole  (PRILOSEC) 20 MG capsule Take 1 capsule (20 mg total) by mouth daily. Patient not taking: Reported on 08/14/2024 05/16/24  Dean Clarity, MD  oxyCODONE -acetaminophen  (PERCOCET/ROXICET) 5-325 MG tablet Take 1 tablet by mouth every 6 (six) hours as needed for severe pain. Patient not taking: Reported on 08/14/2024 05/30/22   Long, Fonda MATSU, MD  senna-docusate (SENOKOT-S) 8.6-50 MG tablet Take 1 tablet by mouth at bedtime as needed for mild constipation or moderate constipation. Patient not taking: Reported on 08/14/2024 05/30/22   LongFonda MATSU, MD  ZETIA 10 MG tablet  07/22/13   [provider]    Physical Exam: Vitals:   08/14/24 1035 08/14/24 1446 08/14/24 1902 08/14/24 2230  BP: (!) 152/93 (!) 180/90 (!) 174/55 (!) 183/73  Pulse: (!) 52 (!) 57 (!) 56 (!) 58  Resp: 16 17 15 18   Temp: 97.7 F  (36.5 C) (!) 97.5 F (36.4 C) 98.2 F (36.8 C) 97.6 F (36.4 C)  TempSrc:  Oral Oral Oral  SpO2: 95% 96% 96% 97%  Weight:      Height:        Constitutional: NAD, calm, comfortable Vitals:   08/14/24 1035 08/14/24 1446 08/14/24 1902 08/14/24 2230  BP: (!) 152/93 (!) 180/90 (!) 174/55 (!) 183/73  Pulse: (!) 52 (!) 57 (!) 56 (!) 58  Resp: 16 17 15 18   Temp: 97.7 F (36.5 C) (!) 97.5 F (36.4 C) 98.2 F (36.8 C) 97.6 F (36.4 C)  TempSrc:  Oral Oral Oral  SpO2: 95% 96% 96% 97%  Weight:      Height:       Eyes: PERRL, lids and conjunctivae normal ENMT: Mucous membranes are moist. Posterior pharynx clear of any exudate or lesions.Normal dentition.  Neck: normal, supple, no masses, no thyromegaly Respiratory: clear to auscultation bilaterally, no wheezing, no crackles. Normal respiratory effort. No accessory muscle use.  Cardiovascular: Regular rate and rhythm, no murmurs / rubs / gallops. No extremity edema. 2+ pedal pulses.   Abdomen: + tenderness, no masses palpated. No hepatosplenomegaly. Bowel sounds positive.  Musculoskeletal: no clubbing / cyanosis. No joint deformity upper and lower extremities. Good ROM, no contractures. Normal muscle tone.  Skin: no rashes, lesions, ulcers. No induration Neurologic: CN 2-12 grossly intact. Sensation intact, Strength 5/5 in all 4.  Psychiatric: Normal judgment and insight. Alert and oriented x 3. Normal mood.    Labs on Admission: I have personally reviewed following labs and imaging studies  CBC: Recent Labs  Lab 08/14/24 1100  WBC 7.3  NEUTROABS 5.3  HGB 12.5  HCT 37.7  MCV 93.8  PLT 160   Basic Metabolic Panel: Recent Labs  Lab 08/14/24 1100  NA 142  K 3.9  CL 103  CO2 29  GLUCOSE 93  BUN 13  CREATININE 0.80  CALCIUM  9.6   GFR: Estimated Creatinine Clearance: 49.6 mL/min (by C-G formula based on SCr of 0.8 mg/dL). Liver Function Tests: Recent Labs  Lab 08/14/24 1100  AST 17  ALT 14  ALKPHOS 34*  BILITOT  0.6  PROT 6.7  ALBUMIN 4.3   Recent Labs  Lab 08/14/24 1100  LIPASE 26   No results for input(s): AMMONIA in the last 168 hours. Coagulation Profile: No results for input(s): INR, PROTIME in the last 168 hours. Cardiac Enzymes: No results for input(s): CKTOTAL, CKMB, CKMBINDEX, TROPONINI in the last 168 hours. BNP (last 3 results) Recent Labs    12/18/23 1724  PROBNP 210.0   HbA1C: No results for input(s): HGBA1C in the last 72 hours. CBG: No results for input(s): GLUCAP in the last 168 hours. Lipid Profile: No results for  input(s): CHOL, HDL, LDLCALC, TRIG, CHOLHDL, LDLDIRECT in the last 72 hours. Thyroid  Function Tests: No results for input(s): TSH, T4TOTAL, FREET4, T3FREE, THYROIDAB in the last 72 hours. Anemia Panel: No results for input(s): VITAMINB12, FOLATE, FERRITIN, TIBC, IRON, RETICCTPCT in the last 72 hours. Urine analysis:    Component Value Date/Time   COLORURINE YELLOW 08/14/2024 1100   APPEARANCEUR CLEAR 08/14/2024 1100   LABSPEC 1.020 08/14/2024 1100   PHURINE 7.5 08/14/2024 1100   GLUCOSEU NEGATIVE 08/14/2024 1100   HGBUR TRACE (A) 08/14/2024 1100   BILIRUBINUR NEGATIVE 08/14/2024 1100   KETONESUR NEGATIVE 08/14/2024 1100   PROTEINUR NEGATIVE 08/14/2024 1100   UROBILINOGEN 0.2 02/17/2009 1934   NITRITE NEGATIVE 08/14/2024 1100   LEUKOCYTESUR NEGATIVE 08/14/2024 1100    Radiological Exams on Admission: DG Chest Portable 1 View Result Date: 08/14/2024 CLINICAL DATA:  Hypoxia, left lower quadrant pain EXAM: PORTABLE CHEST 1 VIEW COMPARISON:  07/06/2024 FINDINGS: Single frontal view of the chest demonstrates an unremarkable cardiac silhouette. Diffuse interstitial prominence compatible with background scarring. No acute airspace disease, effusion, or pneumothorax. No acute bony abnormalities. IMPRESSION: 1. Chronic scarring.  No acute process. Electronically Signed   By: Ozell Daring M.D.   On:  08/14/2024 15:41   CT ABDOMEN PELVIS W CONTRAST Result Date: 08/14/2024 EXAM: CT ABDOMEN AND PELVIS WITH CONTRAST 08/14/2024 12:51:58 PM TECHNIQUE: CT of the abdomen and pelvis was performed with the administration of 75 mL of iohexol  (OMNIPAQUE ) 300 MG/ML solution. Multiplanar reformatted images are provided for review. Automated exposure control, iterative reconstruction, and/or weight-based adjustment of the mA/kV was utilized to reduce the radiation dose to as low as reasonably achievable. COMPARISON: 05/16/2024. CLINICAL HISTORY: LLQ abdominal pain. Patient presents with left lower quadrant abdominal pain. FINDINGS: LOWER CHEST: Mild cardiomegaly. Dense right coronary artery atherosclerosis. Dependent atelectasis in the lung bases. LIVER: Small cyst in the left hepatic lobe. GALLBLADDER AND BILE DUCTS: Cholecystectomy. No sonographic findings of acute cholecystitis. No biliary ductal dilatation. SPLEEN: No acute abnormality. PANCREAS: No acute abnormality. ADRENAL GLANDS: Unchanged 2.6 cm left adrenal nodule. This is consistent with a benign adenoma. KIDNEYS, URETERS AND BLADDER: Unchanged tiny subcentimeter hypodensity in the right interpolar region, too small to definitively characterize, common but likely a small cyst. No stones in the kidneys or ureters. No hydronephrosis. No perinephric or periureteral stranding. Urinary bladder is unremarkable. GI AND BOWEL: Stomach demonstrates no acute abnormality. Descending and sigmoid colonic diverticulosis. Inflammation surrounding the mid to distal descending colonic diverticula. No peridiverticular abscess or pneumoperitoneum. The appendix was not visualized. There is no bowel obstruction. PERITONEUM AND RETROPERITONEUM: Trace free fluid in the pelvis, likely reactive. No free air. VASCULATURE: Aorta is normal in caliber. Extensive aortoiliac atherosclerosis. LYMPH NODES: Unchanged 1.8 cm fluid density structure in the aortocaval region, probably a prominent  cisterna chyli .no lymphadenopathy. REPRODUCTIVE ORGANS: Hysterectomy. BONES AND SOFT TISSUES: Diffuse osteopenia. Mild multilevel degenerative disc disease of the visualized spine. Mild bilateral hip osteoarthritis. No focal soft tissue abnormality. IMPRESSION: 1. Acute diverticulitis of the mid to distal descending colon. No peridiverticular abscess or pneumoperitoneum. Electronically signed by: Rogelia Myers MD 08/14/2024 02:02 PM EST RP Workstation: [id number]    EKG:   Assessment/Plan  Acute Diverticulitis  - med/surg  - continue cipro , metronidazole   - place on clear liquids for now , advance as tolerated   Lethargy due to drug side-effect  -avoid sedative medication  -iv tylenol  for pain   CAD s/p stent -no active issues resume cardiac medications  HLD -continue home regimen  HTN, uncontrolled -treat pain  -resume out patient medications    DVT prophylaxis: heparin  Code Status: full/ as discussed per patient wishes in event of cardiac arrest  Family Communication:   [place],Nicholas Spouse, Emergency Contact [phone] (Mobile)   Disposition Plan: full/ as discussed per patient wishes in event of cardiac arrest  Consults called: n/a Admission status: full/ as discussed per patient wishes in event of cardiac arrest    Kim DELENA Ned MD [facility]   If 7PM-7AM, please contact night-coverage [email or web address] Password [email or web address]  08/15/2024, 12:14 AM        [1]  Allergies Allergen Reactions   Ceclor [Cefaclor] Hives and Anaphylaxis   Atorvastatin Other (See Comments)   "

## 2024-08-15 NOTE — Progress Notes (Addendum)
 RN started cipro  and flagyl  one in each L arm PIV. Pt c/o of localized itching that went up her L arm. RN stopped IV infusions, notified on call provider, and followed orders to give benadryl  and pepcid  combo. Pt denies any shob, resp distress or chest pain. RN will continue to monitor for changes. Vitals updated in flowsheet. Pharmacy notified.  Itching has stopped with the above interventions. Provider notified.   Pt weaned to 1L [place] O2 with SpO2 at 94%  Pt is voiding and is ambulatory with one assist to the bathroom.

## 2024-08-15 NOTE — Care Management Obs Status (Signed)
 MEDICARE OBSERVATION STATUS NOTIFICATION   Patient Details  Name: Kim Sutton MRN: [id number] Date of Birth: [birth year]   Medicare Observation Status Notification Given:  Yes    Alfonse JONELLE Rex, RN 08/15/2024, 12:51 PM

## 2024-08-15 NOTE — Progress Notes (Signed)
" °   08/15/24 0500  Vitals  Temp 98 F (36.7 C)  Temp Source Oral  BP (!) 151/62  MAP (mmHg) (!) 59  BP Location Right Arm  BP Method Automatic  Patient Position (if appropriate) Lying  Pulse Rate (!) 58  Pulse Rate Source Monitor  Resp 16  Level of Consciousness  Level of Consciousness Alert  MEWS COLOR  MEWS Score Color Green  Oxygen Therapy  SpO2 96 %  O2 Device Nasal Cannula  O2 Flow Rate (L/min) 2 L/min  Pain Assessment  Pain Scale 0-10  Pain Score 5  Pain Location Abdomen  MEWS Score  MEWS Temp 0  MEWS Systolic 0  MEWS Pulse 0  MEWS RR 0  MEWS LOC 0  MEWS Score 0    "

## 2024-09-06 ENCOUNTER — Emergency Department (HOSPITAL_BASED_OUTPATIENT_CLINIC_OR_DEPARTMENT_OTHER)

## 2024-09-06 ENCOUNTER — Emergency Department (HOSPITAL_BASED_OUTPATIENT_CLINIC_OR_DEPARTMENT_OTHER)
Admission: EM | Admit: 2024-09-06 | Discharge: 2024-09-06 | Disposition: A | Discharge status: Home / Self Care | Source: Home / Self Care | Attending: Emergency Medicine | Admitting: Emergency Medicine

## 2024-09-06 ENCOUNTER — Other Ambulatory Visit: Payer: Self-pay

## 2024-09-06 ENCOUNTER — Encounter (HOSPITAL_BASED_OUTPATIENT_CLINIC_OR_DEPARTMENT_OTHER): Payer: Self-pay | Admitting: Emergency Medicine

## 2024-09-06 DIAGNOSIS — L03115 Cellulitis of right lower limb: Secondary | ICD-10-CM

## 2024-09-06 LAB — COMPREHENSIVE METABOLIC PANEL WITH GFR
ALT: 16 U/L (ref 0–44)
AST: 20 U/L (ref 15–41)
Albumin: 4.4 g/dL (ref 3.5–5.0)
Alkaline Phosphatase: 36 U/L — ABNORMAL LOW (ref 38–126)
Anion gap: 7 (ref 5–15)
BUN: 11 mg/dL (ref 8–23)
CO2: 30 mmol/L (ref 22–32)
Calcium: 9.4 mg/dL (ref 8.9–10.3)
Chloride: 104 mmol/L (ref 98–111)
Creatinine, Ser: 0.78 mg/dL (ref 0.44–1.00)
GFR, Estimated: >60 mL/min
Glucose, Bld: 104 mg/dL — ABNORMAL HIGH (ref 70–99)
Potassium: 4.1 mmol/L (ref 3.5–5.1)
Sodium: 141 mmol/L (ref 135–145)
Total Bilirubin: 0.6 mg/dL (ref 0.0–1.2)
Total Protein: 6.7 g/dL (ref 6.5–8.1)

## 2024-09-06 LAB — CBC WITH DIFFERENTIAL/PLATELET
Abs Immature Granulocytes: 0.01 10*3/uL (ref 0.00–0.07)
Basophils Absolute: 0 10*3/uL (ref 0.0–0.1)
Basophils Relative: 1 %
Eosinophils Absolute: 0.1 10*3/uL (ref 0.0–0.5)
Eosinophils Relative: 1 %
HCT: 38.1 % (ref 36.0–46.0)
Hemoglobin: 12.7 g/dL (ref 12.0–15.0)
Immature Granulocytes: 0 %
Lymphocytes Relative: 22 %
Lymphs Abs: 1.3 10*3/uL (ref 0.7–4.0)
MCH: 31.1 pg (ref 26.0–34.0)
MCHC: 33.3 g/dL (ref 30.0–36.0)
MCV: 93.4 fL (ref 80.0–100.0)
Monocytes Absolute: 0.5 10*3/uL (ref 0.1–1.0)
Monocytes Relative: 9 %
Neutro Abs: 3.9 10*3/uL (ref 1.7–7.7)
Neutrophils Relative %: 67 %
Platelets: 181 10*3/uL (ref 150–400)
RBC: 4.08 MIL/uL (ref 3.87–5.11)
RDW: 13.3 % (ref 11.5–15.5)
WBC: 5.7 10*3/uL (ref 4.0–10.5)
nRBC: 0 % (ref 0.0–0.2)

## 2024-09-06 MED ORDER — DOXYCYCLINE HYCLATE 100 MG PO CAPS: 100.0000 mg | ORAL_CAPSULE | Freq: Two times a day (BID) | ORAL | 0 refills | Status: AC

## 2024-09-06 NOTE — ED Provider Triage Note (Cosign Needed)
 Emergency Medicine Provider Triage Evaluation Note  Kim Sutton , a 89 y.o. female  was evaluated in triage.  Pt complains of right leg pain and swelling. Slammed in a car door 5 days ago more painful since then, and swelling worsening. Sent over for DVT rule-out.  Review of Systems  Positive: Right leg pain and swelling Negative: Chest pain, shortness of breath  Physical Exam  BP (!) 208/80 (BP Location: Right Arm)   Pulse (!) 45   Temp 97.7 F (36.5 C)   Resp 16   Ht 5' 6 (1.676 m)   Wt 73.5 kg   SpO2 100%   BMI 26.15 kg/m  Gen:   Awake, no distress   Resp:  Normal effort  MSK:   Moves extremities without difficulty  Other:  Circumferential right distal leg pain and swelling, calf tenderness present  Medical Decision Making  Medically screening exam initiated at 2:56 PM.  Appropriate orders placed.  Kim Sutton was informed that the remainder of the evaluation will be completed by another provider, this initial triage assessment does not replace that evaluation, and the importance of remaining in the ED until their evaluation is complete.  Orders: CBC, CMP, DVT ultrasound RLE   Kim Terrall FALCON, PA-C 09/06/24 1457

## 2024-09-06 NOTE — Discharge Instructions (Signed)
 1.  Start taking doxycycline  twice a day as prescribed. 2.  Continue elevating your leg is much as possible when you are not up and moving around. 3.  Continue cleaning the wound and applying antibiotic ointment twice a day and keeping it dressed. 4.  Follow-up with your doctor for recheck in the next 3 to 5 days. 5.  Return to the emergency department you develop a fever, rapidly worsening redness or pain or other concerning changes.

## 2024-09-06 NOTE — ED Triage Notes (Signed)
 Rt leg slammed in car door 5 days and it has been getting more swollen  saw dr today and sent for tests to rule out clot

## 2024-09-06 NOTE — ED Provider Notes (Signed)
 " [place] [facility] AT [facility] Provider Note   CSN: [id number] Arrival date & time: 09/06/24  1433     Patient presents with: No chief complaint on file.   Kim Sutton is a 89 y.o. female.   HPI About a week ago, the patient got the back of her right leg caught on a car door.  She was getting out and the door started falling closed and hit the back of her right leg.  It created a skin tear.  She and her family member have been managing the skin tear at home with antibiotic ointment and dressings.  It seemed to be healing but now over the past couple of days there has been increased redness and swelling of the lower leg.  No fevers no chills no mental status change.  Patient was seen by her provider today and sent to the emergency department to get a DVT study.    Prior to Admission medications  Medication Sig Start Date End Date Taking? Authorizing Provider  doxycycline  (VIBRAMYCIN ) 100 MG capsule Take 1 capsule (100 mg total) by mouth 2 (two) times daily. One po bid x 7 days 09/06/24  Yes Armenta Canning, MD  amoxicillin -clavulanate (AUGMENTIN ) 875-125 MG tablet Take 1 tablet by mouth every 12 (twelve) hours. 08/15/24   Samtani, Jai-Gurmukh, MD  aspirin  81 MG tablet Take 324 mg by mouth daily.     [provider]  diclofenac  Sodium (VOLTAREN ) 1 % GEL Apply 2 g topically 4 (four) times daily as needed. Patient not taking: Reported on 08/14/2024 05/30/22   Long, Joshua G, MD  lisinopril (ZESTRIL) 20 MG tablet Take 20 mg by mouth daily.    [provider]  metoprolol succinate (TOPROL-XL) 50 MG 24 hr tablet Take 50 mg by mouth daily. Take with or immediately following a meal.    [provider]  omeprazole  (PRILOSEC) 20 MG capsule Take 1 capsule (20 mg total) by mouth daily. Patient not taking: Reported on 08/14/2024 05/16/24   Dean Clarity, MD  rosuvastatin  (CRESTOR ) 5 MG tablet Take 1 tablet (5 mg total) by mouth daily. 05/05/22  08/14/24  Kate Lonni CROME, MD  senna-docusate (SENOKOT-S) 8.6-50 MG tablet Take 1 tablet by mouth at bedtime as needed for mild constipation or moderate constipation. Patient not taking: Reported on 08/14/2024 05/30/22   Long, Fonda MATSU, MD  SPIRIVA RESPIMAT 2.5 MCG/ACT AERS INHALE 2 PUFFS ONCE DAILY 90 for 30    [provider]  traMADol  (ULTRAM ) 50 MG tablet Take 1 tablet (50 mg total) by mouth every 6 (six) hours as needed for severe pain (pain score 7-10). 08/15/24 08/15/25  Samtani, Jai-Gurmukh, MD  ZETIA 10 MG tablet  07/22/13   [provider]    Allergies: Ceclor [cefaclor] and Atorvastatin    Review of Systems  Updated Vital Signs BP (!) 183/75   Pulse (!) 49   Temp 97.8 F (36.6 C)   Resp 16   Ht 5' 6 (1.676 m)   Wt 73.5 kg   SpO2 95%   BMI 26.15 kg/m   Physical Exam Constitutional:      Comments: Patient is alert.  No acute distress.  HENT:     Mouth/Throat:     Pharynx: Oropharynx is clear.  Pulmonary:     Effort: Pulmonary effort is normal.  Musculoskeletal:     Comments: Patient has a healing skin tear puncture wound to the posterior right leg.  See attached images.  There  is edema diffusely of the lower leg and ankle with associated erythema.  Foot is warm and dry.  Patient has chronic stasis changes of the left lower extremity which she reports is her baseline.  See attached images  Skin:    General: Skin is warm and dry.  Neurological:     General: No focal deficit present.     (all labs ordered are listed, but only abnormal results are displayed) Labs Reviewed  CBC WITH DIFFERENTIAL/PLATELET  COMPREHENSIVE METABOLIC PANEL WITH GFR    EKG: None  Radiology: US  Venous Img Lower Right (DVT Study) Result Date: 09/06/2024 CLINICAL DATA:  Right leg red and swollen one-week post trauma. EXAM: Right LOWER EXTREMITY VENOUS DOPPLER ULTRASOUND TECHNIQUE: Gray-scale sonography with compression, as well as color and duplex ultrasound, were  performed to evaluate the deep venous system(s) from the level of the common femoral vein through the popliteal and proximal calf veins. COMPARISON:  None Available. FINDINGS: VENOUS Normal compressibility of the common femoral, superficial femoral, and popliteal veins, as well as the visualized calf veins. Visualized portions of profunda femoral vein and great saphenous vein unremarkable. No filling defects to suggest DVT on grayscale or color Doppler imaging. Doppler waveforms show normal direction of venous flow, normal respiratory plasticity and response to augmentation. Limited views of the contralateral common femoral vein are unremarkable. OTHER Age indeterminate short segment nonocclusive SVT in the small saphenous vein. Limitations: none IMPRESSION: Negative for DVT in the right lower extremity. Focal age indeterminate short segment nonocclusive SVT in the small saphenous vein. Electronically Signed   By: Cordella Banner   On: 09/06/2024 16:14     Procedures   Medications Ordered in the ED - No data to display                                  Medical Decision Making Risk Prescription drug management.   Patient presents as outlined.  DVT study obtained and negative for DVT.  There is 1 superficial indeterminate age clot.  On examination, with DVT ruled out, I do plan to treat the patient for cellulitis secondary to wound.  Edema and erythema could be due to resolving swelling and inflammatory change however patient reports it has become more painful over the past several days.  She does have cefaclor allergy listed as hives and anaphylaxis.  At this time we will opt with doxycycline ..  Counseled the patient and her husband on continuing local wound care and elevation with follow-up with PCP for recheck.  They voiced understanding.     Final diagnoses:  Cellulitis of right lower extremity    ED Discharge Orders          Ordered    doxycycline  (VIBRAMYCIN ) 100 MG capsule  2 times  daily        09/06/24 TRENNA Armenta Canning, MD 09/06/24 [phone]  "

## 2024-09-06 NOTE — ED Notes (Signed)

## 2024-09-06 NOTE — ED Notes (Signed)
 Fall risk armband Fall risk sign on door  Fall risk socks
